# Patient Record
Sex: Male | Born: 1935 | Race: White | Hispanic: No | Marital: Married | Smoking: Former smoker
Health system: Southern US, Community
[De-identification: ages and names within clinical notes are randomized; demographics above are authoritative.]

## PROBLEM LIST (undated history)

## (undated) DIAGNOSIS — I2510 Atherosclerotic heart disease of native coronary artery without angina pectoris: Secondary | ICD-10-CM

## (undated) DIAGNOSIS — I6529 Occlusion and stenosis of unspecified carotid artery: Secondary | ICD-10-CM

## (undated) DIAGNOSIS — R030 Elevated blood-pressure reading, without diagnosis of hypertension: Secondary | ICD-10-CM

## (undated) DIAGNOSIS — K56609 Unspecified intestinal obstruction, unspecified as to partial versus complete obstruction: Secondary | ICD-10-CM

## (undated) DIAGNOSIS — F17201 Nicotine dependence, unspecified, in remission: Secondary | ICD-10-CM

## (undated) DIAGNOSIS — C4490 Unspecified malignant neoplasm of skin, unspecified: Secondary | ICD-10-CM

## (undated) DIAGNOSIS — M1990 Unspecified osteoarthritis, unspecified site: Secondary | ICD-10-CM

## (undated) DIAGNOSIS — I739 Peripheral vascular disease, unspecified: Secondary | ICD-10-CM

## (undated) DIAGNOSIS — I340 Nonrheumatic mitral (valve) insufficiency: Secondary | ICD-10-CM

## (undated) DIAGNOSIS — D649 Anemia, unspecified: Secondary | ICD-10-CM

## (undated) DIAGNOSIS — I441 Atrioventricular block, second degree: Secondary | ICD-10-CM

## (undated) DIAGNOSIS — E278 Other specified disorders of adrenal gland: Secondary | ICD-10-CM

## (undated) DIAGNOSIS — Q791 Other congenital malformations of diaphragm: Secondary | ICD-10-CM

## (undated) DIAGNOSIS — R634 Abnormal weight loss: Secondary | ICD-10-CM

## (undated) DIAGNOSIS — K219 Gastro-esophageal reflux disease without esophagitis: Secondary | ICD-10-CM

## (undated) DIAGNOSIS — E039 Hypothyroidism, unspecified: Secondary | ICD-10-CM

## (undated) DIAGNOSIS — C329 Malignant neoplasm of larynx, unspecified: Secondary | ICD-10-CM

## (undated) DIAGNOSIS — I350 Nonrheumatic aortic (valve) stenosis: Secondary | ICD-10-CM

## (undated) DIAGNOSIS — E785 Hyperlipidemia, unspecified: Secondary | ICD-10-CM

## (undated) HISTORY — DX: Anemia, unspecified: D64.9

## (undated) HISTORY — PX: DECOMPRESSION FACIAL NERVE: SUR400

## (undated) HISTORY — DX: Elevated blood-pressure reading, without diagnosis of hypertension: R03.0

## (undated) HISTORY — DX: Malignant neoplasm of larynx, unspecified: C32.9

## (undated) HISTORY — DX: Atrioventricular block, second degree: I44.1

## (undated) HISTORY — DX: Nicotine dependence, unspecified, in remission: F17.201

## (undated) HISTORY — DX: Nonrheumatic aortic (valve) stenosis: I35.0

## (undated) HISTORY — PX: KNEE ARTHROSCOPY: SUR90

## (undated) HISTORY — DX: Hyperlipidemia, unspecified: E78.5

## (undated) HISTORY — DX: Gastro-esophageal reflux disease without esophagitis: K21.9

## (undated) HISTORY — DX: Hypothyroidism, unspecified: E03.9

## (undated) HISTORY — DX: Peripheral vascular disease, unspecified: I73.9

## (undated) HISTORY — DX: Other specified disorders of adrenal gland: E27.8

## (undated) HISTORY — PX: CATARACT EXTRACTION, BILATERAL: SHX1313

## (undated) HISTORY — DX: Other congenital malformations of diaphragm: Q79.1

## (undated) HISTORY — DX: Atherosclerotic heart disease of native coronary artery without angina pectoris: I25.10

## (undated) HISTORY — DX: Unspecified osteoarthritis, unspecified site: M19.90

## (undated) HISTORY — PX: TOTAL KNEE ARTHROPLASTY: SHX125

## (undated) HISTORY — PX: PACEMAKER INSERTION: SHX728

## (undated) HISTORY — DX: Abnormal weight loss: R63.4

## (undated) HISTORY — PX: INSERT / REPLACE / REMOVE PACEMAKER: SUR710

---

## 1971-09-09 HISTORY — PX: APPENDECTOMY: SHX54

## 1986-09-08 HISTORY — PX: LARYNGECTOMY: SUR815

## 2000-06-10 ENCOUNTER — Inpatient Hospital Stay (HOSPITAL_COMMUNITY): Admission: AD | Admit: 2000-06-10 | Discharge: 2000-06-12 | Payer: Self-pay | Admitting: Cardiology

## 2001-04-18 ENCOUNTER — Emergency Department (HOSPITAL_COMMUNITY): Admission: EM | Admit: 2001-04-18 | Discharge: 2001-04-18 | Payer: Self-pay | Admitting: Emergency Medicine

## 2001-04-18 ENCOUNTER — Encounter: Payer: Self-pay | Admitting: *Deleted

## 2001-06-05 ENCOUNTER — Emergency Department (HOSPITAL_COMMUNITY): Admission: EM | Admit: 2001-06-05 | Discharge: 2001-06-05 | Payer: Self-pay | Admitting: *Deleted

## 2001-06-14 ENCOUNTER — Encounter: Payer: Self-pay | Admitting: Internal Medicine

## 2001-06-14 ENCOUNTER — Ambulatory Visit (HOSPITAL_COMMUNITY): Admission: RE | Admit: 2001-06-14 | Discharge: 2001-06-14 | Payer: Self-pay | Admitting: Internal Medicine

## 2002-02-11 ENCOUNTER — Ambulatory Visit (HOSPITAL_COMMUNITY): Admission: RE | Admit: 2002-02-11 | Discharge: 2002-02-11 | Payer: Self-pay | Admitting: Internal Medicine

## 2002-02-11 ENCOUNTER — Encounter: Payer: Self-pay | Admitting: Internal Medicine

## 2002-03-24 ENCOUNTER — Ambulatory Visit (HOSPITAL_COMMUNITY): Admission: RE | Admit: 2002-03-24 | Discharge: 2002-03-24 | Payer: Self-pay | Admitting: Neurosurgery

## 2002-03-31 ENCOUNTER — Encounter: Payer: Self-pay | Admitting: Internal Medicine

## 2002-03-31 ENCOUNTER — Ambulatory Visit (HOSPITAL_COMMUNITY): Admission: RE | Admit: 2002-03-31 | Discharge: 2002-03-31 | Payer: Self-pay | Admitting: Internal Medicine

## 2002-07-24 ENCOUNTER — Encounter: Payer: Self-pay | Admitting: Emergency Medicine

## 2002-07-24 ENCOUNTER — Inpatient Hospital Stay (HOSPITAL_COMMUNITY): Admission: EM | Admit: 2002-07-24 | Discharge: 2002-07-29 | Payer: Self-pay | Admitting: Emergency Medicine

## 2002-07-28 ENCOUNTER — Encounter: Payer: Self-pay | Admitting: Internal Medicine

## 2002-09-05 ENCOUNTER — Emergency Department (HOSPITAL_COMMUNITY): Admission: EM | Admit: 2002-09-05 | Discharge: 2002-09-06 | Payer: Self-pay | Admitting: Emergency Medicine

## 2002-09-12 ENCOUNTER — Other Ambulatory Visit: Admission: RE | Admit: 2002-09-12 | Discharge: 2002-09-12 | Payer: Self-pay | Admitting: Dermatology

## 2002-11-01 ENCOUNTER — Ambulatory Visit (HOSPITAL_COMMUNITY): Admission: RE | Admit: 2002-11-01 | Discharge: 2002-11-01 | Payer: Self-pay | Admitting: Internal Medicine

## 2002-11-01 ENCOUNTER — Encounter: Payer: Self-pay | Admitting: Internal Medicine

## 2003-04-04 ENCOUNTER — Ambulatory Visit (HOSPITAL_COMMUNITY): Admission: RE | Admit: 2003-04-04 | Discharge: 2003-04-04 | Payer: Self-pay | Admitting: Internal Medicine

## 2003-07-24 ENCOUNTER — Ambulatory Visit (HOSPITAL_COMMUNITY): Admission: RE | Admit: 2003-07-24 | Discharge: 2003-07-24 | Payer: Self-pay | Admitting: Internal Medicine

## 2003-07-26 ENCOUNTER — Other Ambulatory Visit: Admission: RE | Admit: 2003-07-26 | Discharge: 2003-07-26 | Payer: Self-pay | Admitting: Dermatology

## 2003-12-19 ENCOUNTER — Ambulatory Visit (HOSPITAL_COMMUNITY): Admission: RE | Admit: 2003-12-19 | Discharge: 2003-12-19 | Payer: Self-pay | Admitting: Internal Medicine

## 2004-01-17 ENCOUNTER — Ambulatory Visit (HOSPITAL_COMMUNITY): Admission: RE | Admit: 2004-01-17 | Discharge: 2004-01-17 | Payer: Self-pay | Admitting: Internal Medicine

## 2004-03-22 ENCOUNTER — Ambulatory Visit (HOSPITAL_COMMUNITY): Admission: RE | Admit: 2004-03-22 | Discharge: 2004-03-22 | Payer: Self-pay | Admitting: Internal Medicine

## 2005-01-07 ENCOUNTER — Encounter: Admission: RE | Admit: 2005-01-07 | Discharge: 2005-01-07 | Payer: Self-pay | Admitting: Orthopedic Surgery

## 2005-01-08 ENCOUNTER — Ambulatory Visit (HOSPITAL_COMMUNITY): Admission: RE | Admit: 2005-01-08 | Discharge: 2005-01-08 | Payer: Self-pay | Admitting: Orthopedic Surgery

## 2005-01-08 ENCOUNTER — Ambulatory Visit (HOSPITAL_BASED_OUTPATIENT_CLINIC_OR_DEPARTMENT_OTHER): Admission: RE | Admit: 2005-01-08 | Discharge: 2005-01-08 | Payer: Self-pay | Admitting: Orthopedic Surgery

## 2005-01-29 ENCOUNTER — Ambulatory Visit: Payer: Self-pay | Admitting: Internal Medicine

## 2005-01-29 ENCOUNTER — Ambulatory Visit (HOSPITAL_COMMUNITY): Admission: RE | Admit: 2005-01-29 | Discharge: 2005-01-29 | Payer: Self-pay | Admitting: Internal Medicine

## 2005-01-31 ENCOUNTER — Ambulatory Visit (HOSPITAL_COMMUNITY): Admission: RE | Admit: 2005-01-31 | Discharge: 2005-01-31 | Payer: Self-pay | Admitting: Internal Medicine

## 2005-04-02 ENCOUNTER — Ambulatory Visit (HOSPITAL_COMMUNITY): Admission: RE | Admit: 2005-04-02 | Discharge: 2005-04-02 | Payer: Self-pay | Admitting: Internal Medicine

## 2006-02-18 ENCOUNTER — Ambulatory Visit (HOSPITAL_COMMUNITY): Admission: RE | Admit: 2006-02-18 | Discharge: 2006-02-18 | Payer: Self-pay | Admitting: Pulmonary Disease

## 2006-05-29 ENCOUNTER — Ambulatory Visit (HOSPITAL_COMMUNITY): Admission: RE | Admit: 2006-05-29 | Discharge: 2006-05-29 | Payer: Self-pay | Admitting: Internal Medicine

## 2006-06-30 ENCOUNTER — Ambulatory Visit (HOSPITAL_COMMUNITY): Admission: RE | Admit: 2006-06-30 | Discharge: 2006-06-30 | Payer: Self-pay | Admitting: Vascular Surgery

## 2006-08-28 ENCOUNTER — Emergency Department (HOSPITAL_COMMUNITY): Admission: EM | Admit: 2006-08-28 | Discharge: 2006-08-28 | Payer: Self-pay | Admitting: Emergency Medicine

## 2007-11-22 ENCOUNTER — Ambulatory Visit (HOSPITAL_COMMUNITY): Admission: RE | Admit: 2007-11-22 | Discharge: 2007-11-22 | Payer: Self-pay | Admitting: Internal Medicine

## 2007-11-29 ENCOUNTER — Ambulatory Visit (HOSPITAL_COMMUNITY): Admission: RE | Admit: 2007-11-29 | Discharge: 2007-11-29 | Payer: Self-pay | Admitting: Internal Medicine

## 2007-12-29 ENCOUNTER — Ambulatory Visit (HOSPITAL_COMMUNITY): Admission: RE | Admit: 2007-12-29 | Discharge: 2007-12-29 | Payer: Self-pay | Admitting: Neurology

## 2008-01-28 ENCOUNTER — Ambulatory Visit (HOSPITAL_COMMUNITY): Admission: RE | Admit: 2008-01-28 | Discharge: 2008-01-28 | Payer: Self-pay | Admitting: Anesthesiology

## 2008-02-01 ENCOUNTER — Ambulatory Visit (HOSPITAL_BASED_OUTPATIENT_CLINIC_OR_DEPARTMENT_OTHER): Admission: RE | Admit: 2008-02-01 | Discharge: 2008-02-01 | Payer: Self-pay | Admitting: Orthopedic Surgery

## 2008-05-11 ENCOUNTER — Emergency Department (HOSPITAL_COMMUNITY): Admission: EM | Admit: 2008-05-11 | Discharge: 2008-05-12 | Payer: Self-pay | Admitting: Emergency Medicine

## 2008-09-21 ENCOUNTER — Ambulatory Visit: Payer: Self-pay | Admitting: Cardiology

## 2008-09-21 ENCOUNTER — Inpatient Hospital Stay (HOSPITAL_COMMUNITY): Admission: EM | Admit: 2008-09-21 | Discharge: 2008-09-28 | Payer: Self-pay | Admitting: Emergency Medicine

## 2008-09-22 ENCOUNTER — Encounter: Payer: Self-pay | Admitting: Cardiology

## 2008-09-25 ENCOUNTER — Ambulatory Visit: Payer: Self-pay | Admitting: Internal Medicine

## 2008-09-28 ENCOUNTER — Encounter: Payer: Self-pay | Admitting: Internal Medicine

## 2008-10-12 ENCOUNTER — Encounter (INDEPENDENT_AMBULATORY_CARE_PROVIDER_SITE_OTHER): Payer: Self-pay | Admitting: *Deleted

## 2008-10-12 ENCOUNTER — Ambulatory Visit: Payer: Self-pay

## 2008-10-23 ENCOUNTER — Encounter (HOSPITAL_COMMUNITY): Admission: RE | Admit: 2008-10-23 | Discharge: 2008-11-13 | Payer: Self-pay | Admitting: Internal Medicine

## 2008-12-27 DIAGNOSIS — E039 Hypothyroidism, unspecified: Secondary | ICD-10-CM | POA: Insufficient documentation

## 2008-12-27 DIAGNOSIS — Z8669 Personal history of other diseases of the nervous system and sense organs: Secondary | ICD-10-CM

## 2008-12-27 DIAGNOSIS — M1990 Unspecified osteoarthritis, unspecified site: Secondary | ICD-10-CM | POA: Insufficient documentation

## 2008-12-29 ENCOUNTER — Encounter: Payer: Self-pay | Admitting: Internal Medicine

## 2008-12-29 ENCOUNTER — Ambulatory Visit: Payer: Self-pay | Admitting: Internal Medicine

## 2009-01-22 ENCOUNTER — Ambulatory Visit (HOSPITAL_COMMUNITY): Admission: RE | Admit: 2009-01-22 | Discharge: 2009-01-22 | Payer: Self-pay | Admitting: Internal Medicine

## 2009-03-07 ENCOUNTER — Ambulatory Visit: Payer: Self-pay | Admitting: Cardiology

## 2009-03-07 ENCOUNTER — Encounter: Payer: Self-pay | Admitting: Cardiology

## 2009-03-07 DIAGNOSIS — F17201 Nicotine dependence, unspecified, in remission: Secondary | ICD-10-CM

## 2009-03-07 DIAGNOSIS — I739 Peripheral vascular disease, unspecified: Secondary | ICD-10-CM

## 2009-03-07 DIAGNOSIS — I679 Cerebrovascular disease, unspecified: Secondary | ICD-10-CM

## 2009-03-07 DIAGNOSIS — C329 Malignant neoplasm of larynx, unspecified: Secondary | ICD-10-CM

## 2009-03-07 DIAGNOSIS — I359 Nonrheumatic aortic valve disorder, unspecified: Secondary | ICD-10-CM

## 2009-03-13 ENCOUNTER — Encounter: Payer: Self-pay | Admitting: Cardiology

## 2009-03-13 LAB — CONVERTED CEMR LAB
Basophils Relative: 0 % (ref 0–1)
Eosinophils Absolute: 0.6 10*3/uL (ref 0.0–0.7)
Eosinophils Relative: 9 % — ABNORMAL HIGH (ref 0–5)
HCT: 36.2 % — ABNORMAL LOW (ref 39.0–52.0)
HDL: 50 mg/dL (ref >39)
Hemoglobin: 11.9 g/dL — ABNORMAL LOW (ref 13.0–17.0)
Lymphs Abs: 1.8 10*3/uL (ref 0.7–4.0)
MCHC: 32.9 g/dL (ref 30.0–36.0)
MCV: 96 fL (ref 78.0–100.0)
Monocytes Absolute: 0.7 10*3/uL (ref 0.1–1.0)
Monocytes Relative: 10 % (ref 3–12)
Neutrophils Relative %: 56 % (ref 43–77)
RBC: 3.77 M/uL — ABNORMAL LOW (ref 4.22–5.81)
Total CHOL/HDL Ratio: 2.7
VLDL: 13 mg/dL (ref 0–40)
WBC: 7.2 10*3/uL (ref 4.0–10.5)

## 2009-03-20 ENCOUNTER — Telehealth (INDEPENDENT_AMBULATORY_CARE_PROVIDER_SITE_OTHER): Payer: Self-pay | Admitting: *Deleted

## 2009-04-06 ENCOUNTER — Ambulatory Visit: Payer: Self-pay | Admitting: Cardiology

## 2009-04-20 ENCOUNTER — Ambulatory Visit: Payer: Self-pay

## 2009-04-20 ENCOUNTER — Encounter: Payer: Self-pay | Admitting: Internal Medicine

## 2009-04-30 ENCOUNTER — Encounter (INDEPENDENT_AMBULATORY_CARE_PROVIDER_SITE_OTHER): Payer: Self-pay | Admitting: *Deleted

## 2009-05-02 LAB — CONVERTED CEMR LAB
OCCULT 1: NEGATIVE
OCCULT 2: POSITIVE

## 2009-07-11 ENCOUNTER — Encounter (INDEPENDENT_AMBULATORY_CARE_PROVIDER_SITE_OTHER): Payer: Self-pay | Admitting: *Deleted

## 2009-07-11 LAB — CONVERTED CEMR LAB
Albumin: 4.1 g/dL
Alkaline Phosphatase: 69 units/L
BUN: 15 mg/dL
CO2: 24 meq/L
Calcium: 9 mg/dL
Glucose, Bld: 90 mg/dL
Sodium: 133 meq/L

## 2009-09-19 ENCOUNTER — Encounter (INDEPENDENT_AMBULATORY_CARE_PROVIDER_SITE_OTHER): Payer: Self-pay | Admitting: *Deleted

## 2009-09-20 ENCOUNTER — Ambulatory Visit: Payer: Self-pay | Admitting: Cardiovascular Disease

## 2009-09-20 ENCOUNTER — Encounter: Payer: Self-pay | Admitting: Adult Health

## 2009-09-20 DIAGNOSIS — Z950 Presence of cardiac pacemaker: Secondary | ICD-10-CM

## 2009-09-21 ENCOUNTER — Ambulatory Visit: Payer: Self-pay | Admitting: Cardiology

## 2009-09-21 ENCOUNTER — Ambulatory Visit (HOSPITAL_COMMUNITY): Admission: RE | Admit: 2009-09-21 | Discharge: 2009-09-21 | Payer: Self-pay | Admitting: Cardiovascular Disease

## 2009-09-21 ENCOUNTER — Encounter: Payer: Self-pay | Admitting: Cardiovascular Disease

## 2009-09-25 ENCOUNTER — Encounter: Payer: Self-pay | Admitting: Adult Health

## 2009-10-24 ENCOUNTER — Ambulatory Visit: Payer: Self-pay | Admitting: Internal Medicine

## 2009-10-24 ENCOUNTER — Encounter: Payer: Self-pay | Admitting: Internal Medicine

## 2009-10-24 DIAGNOSIS — E785 Hyperlipidemia, unspecified: Secondary | ICD-10-CM

## 2009-10-31 ENCOUNTER — Encounter (INDEPENDENT_AMBULATORY_CARE_PROVIDER_SITE_OTHER): Payer: Self-pay | Admitting: *Deleted

## 2010-02-08 ENCOUNTER — Emergency Department (HOSPITAL_COMMUNITY): Admission: EM | Admit: 2010-02-08 | Discharge: 2010-02-08 | Payer: Self-pay | Admitting: Emergency Medicine

## 2010-02-08 LAB — CONVERTED CEMR LAB
CO2: 27 meq/L
Creatinine, Ser: 0.78 mg/dL
GFR calc non Af Amer: >60 mL/min
Glomerular Filtration Rate, Af Am: >60 mL/min/{1.73_m2}
Glucose, Bld: 93 mg/dL
HCT: 31.9 %
Hemoglobin: 10.8 g/dL
Platelets: 253 10*3/uL
WBC: 6.5 10*3/uL

## 2010-02-14 ENCOUNTER — Ambulatory Visit (HOSPITAL_COMMUNITY): Admission: RE | Admit: 2010-02-14 | Discharge: 2010-02-14 | Payer: Self-pay | Admitting: Internal Medicine

## 2010-04-03 ENCOUNTER — Encounter (INDEPENDENT_AMBULATORY_CARE_PROVIDER_SITE_OTHER): Payer: Self-pay | Admitting: *Deleted

## 2010-04-08 ENCOUNTER — Ambulatory Visit: Payer: Self-pay | Admitting: Cardiology

## 2010-04-08 ENCOUNTER — Encounter (INDEPENDENT_AMBULATORY_CARE_PROVIDER_SITE_OTHER): Payer: Self-pay | Admitting: *Deleted

## 2010-04-08 DIAGNOSIS — I441 Atrioventricular block, second degree: Secondary | ICD-10-CM

## 2010-04-08 DIAGNOSIS — D649 Anemia, unspecified: Secondary | ICD-10-CM

## 2010-04-09 ENCOUNTER — Encounter: Payer: Self-pay | Admitting: Cardiology

## 2010-04-09 LAB — CONVERTED CEMR LAB
ALT: 16 units/L (ref 0–53)
AST: 18 units/L (ref 0–37)
Alkaline Phosphatase: 78 units/L (ref 39–117)
Basophils Absolute: 0.1 10*3/uL (ref 0.0–0.1)
Basophils Relative: 1 % (ref 0–1)
CO2: 24 meq/L (ref 19–32)
Cholesterol: 115 mg/dL (ref 0–200)
Creatinine, Ser: 0.85 mg/dL (ref 0.40–1.50)
Eosinophils Absolute: 0.7 10*3/uL (ref 0.0–0.7)
Eosinophils Relative: 9 % — ABNORMAL HIGH (ref 0–5)
HCT: 38.1 % — ABNORMAL LOW (ref 39.0–52.0)
Hemoglobin: 12.2 g/dL — ABNORMAL LOW (ref 13.0–17.0)
MCHC: 32 g/dL (ref 30.0–36.0)
Monocytes Absolute: 0.8 10*3/uL (ref 0.1–1.0)
RDW: 13.2 % (ref 11.5–15.5)
Total Bilirubin: 0.6 mg/dL (ref 0.3–1.2)
Total CHOL/HDL Ratio: 2.7
VLDL: 20 mg/dL (ref 0–40)

## 2010-04-10 ENCOUNTER — Ambulatory Visit: Payer: Self-pay | Admitting: Cardiology

## 2010-05-10 ENCOUNTER — Ambulatory Visit: Payer: Self-pay | Admitting: Cardiology

## 2010-05-21 ENCOUNTER — Ambulatory Visit (HOSPITAL_COMMUNITY): Admission: RE | Admit: 2010-05-21 | Discharge: 2010-05-21 | Payer: Self-pay | Admitting: Internal Medicine

## 2010-06-06 ENCOUNTER — Emergency Department (HOSPITAL_COMMUNITY)
Admission: EM | Admit: 2010-06-06 | Discharge: 2010-06-06 | Payer: Self-pay | Source: Home / Self Care | Admitting: Emergency Medicine

## 2010-06-12 ENCOUNTER — Ambulatory Visit: Payer: Self-pay | Admitting: Internal Medicine

## 2010-06-13 ENCOUNTER — Telehealth: Payer: Self-pay | Admitting: Gastroenterology

## 2010-06-13 ENCOUNTER — Encounter: Payer: Self-pay | Admitting: Internal Medicine

## 2010-06-17 ENCOUNTER — Ambulatory Visit: Payer: Self-pay | Admitting: Gastroenterology

## 2010-06-17 DIAGNOSIS — J189 Pneumonia, unspecified organism: Secondary | ICD-10-CM

## 2010-06-17 DIAGNOSIS — K219 Gastro-esophageal reflux disease without esophagitis: Secondary | ICD-10-CM

## 2010-06-17 DIAGNOSIS — R634 Abnormal weight loss: Secondary | ICD-10-CM

## 2010-06-20 ENCOUNTER — Ambulatory Visit: Payer: Self-pay | Admitting: Internal Medicine

## 2010-06-20 ENCOUNTER — Ambulatory Visit (HOSPITAL_COMMUNITY): Admission: RE | Admit: 2010-06-20 | Discharge: 2010-06-20 | Payer: Self-pay | Admitting: Internal Medicine

## 2010-06-21 ENCOUNTER — Telehealth: Payer: Self-pay | Admitting: Internal Medicine

## 2010-06-28 ENCOUNTER — Telehealth (INDEPENDENT_AMBULATORY_CARE_PROVIDER_SITE_OTHER): Payer: Self-pay

## 2010-07-05 ENCOUNTER — Encounter (INDEPENDENT_AMBULATORY_CARE_PROVIDER_SITE_OTHER): Payer: Self-pay | Admitting: *Deleted

## 2010-07-08 ENCOUNTER — Telehealth (INDEPENDENT_AMBULATORY_CARE_PROVIDER_SITE_OTHER): Payer: Self-pay

## 2010-07-15 ENCOUNTER — Telehealth (INDEPENDENT_AMBULATORY_CARE_PROVIDER_SITE_OTHER): Payer: Self-pay

## 2010-07-17 ENCOUNTER — Telehealth: Payer: Self-pay | Admitting: Gastroenterology

## 2010-07-18 ENCOUNTER — Ambulatory Visit (HOSPITAL_COMMUNITY): Admission: RE | Admit: 2010-07-18 | Discharge: 2010-07-18 | Payer: Self-pay | Admitting: Internal Medicine

## 2010-07-19 ENCOUNTER — Telehealth (INDEPENDENT_AMBULATORY_CARE_PROVIDER_SITE_OTHER): Payer: Self-pay

## 2010-08-07 ENCOUNTER — Ambulatory Visit: Payer: Self-pay | Admitting: Internal Medicine

## 2010-08-09 DIAGNOSIS — A048 Other specified bacterial intestinal infections: Secondary | ICD-10-CM | POA: Insufficient documentation

## 2010-08-09 DIAGNOSIS — B3781 Candidal esophagitis: Secondary | ICD-10-CM | POA: Insufficient documentation

## 2010-09-09 ENCOUNTER — Emergency Department (HOSPITAL_COMMUNITY)
Admission: EM | Admit: 2010-09-09 | Discharge: 2010-09-10 | Payer: Self-pay | Source: Home / Self Care | Admitting: Emergency Medicine

## 2010-09-12 ENCOUNTER — Ambulatory Visit: Admit: 2010-09-12 | Payer: Self-pay | Admitting: Internal Medicine

## 2010-09-17 ENCOUNTER — Encounter (INDEPENDENT_AMBULATORY_CARE_PROVIDER_SITE_OTHER): Payer: Self-pay | Admitting: *Deleted

## 2010-09-20 ENCOUNTER — Encounter: Payer: Self-pay | Admitting: Internal Medicine

## 2010-09-20 ENCOUNTER — Ambulatory Visit: Admission: RE | Admit: 2010-09-20 | Discharge: 2010-09-20 | Payer: Self-pay | Source: Home / Self Care

## 2010-09-28 ENCOUNTER — Encounter: Payer: Self-pay | Admitting: Internal Medicine

## 2010-09-29 ENCOUNTER — Encounter: Payer: Self-pay | Admitting: Internal Medicine

## 2010-10-07 ENCOUNTER — Emergency Department (HOSPITAL_COMMUNITY)
Admission: EM | Admit: 2010-10-07 | Discharge: 2010-10-08 | Payer: Self-pay | Source: Home / Self Care | Admitting: Emergency Medicine

## 2010-10-08 NOTE — Progress Notes (Signed)
Summary: phone note/ rash  Phone Note Call from Patient   Caller: Spouse Summary of Call: Pt's wife called and said he broke out with a rash on Tues after starting Diflucan on Mon. The rash was under his arms, on his abdomen and also on his face. She reports it is much better today, and she wants to know if it could have come from the Diflucan. She is aware Dr. Jena Gauss on vacation and [place], [place] will address later today when she is here. Initial call taken by: Cloria Spring LPN,  June 28, 2010 9:36 AM     Appended Document: phone note/ rash Please address with Dr. Darrick Penna TODAY. I'm not sure what to do in this case. Thanks.  Appended Document: phone note/ rash Please call pt. He should hold the DIF. Call Dr. Jena Gauss on [facility] for an alternative med.  Appended Document: phone note/ rash See SLF recommendations above.  Appended Document: phone note/ rash Pt was informed to stop Diflucan and wait til we contact Dr. Jena Gauss on Mon.  Appended Document: phone note/ rash its reportd rash got better while on diflucan; if this is the case, may be a coincidence; did better over a 4 day period while on med/please confirm  Appended Document: phone note/ rash tried to call pt- he was not at home. wife stated she has not seen him since early this am and wasnt sure how his rash was doing but she would ask him tonight and is requesting we call back tomorrow.   Appended Document: phone note/ rash Pt says his rash is better, still a little on face, around ears and on stomach. Do you want him to  start Diflucan again? Please advise!  Appended Document: phone note/ rash no more diflucan;will rx itraconazole 200mg  orally daily x 14 days - no refills -  then plan for antiHP treattment   Appended Document: phone note/ rash Called to inform pt's wife. She said he had already started on the Prevpak x 2 days ago. (Pharmacist and wife was told that he was not supposed to start the prevpak until  finished Diflucan...but after pt was taken off of the Diflucan for a few days, wife thought he was supposed to start the Prevpak.) Please advise if the above Rx needs to be called in now, and how to do the meds.  Appended Document: phone note/ rash Pt came by the office first thing this morning. York Spaniel he is concerned because he has lost 5 more pounds. Said he feels like he has to have a BM alot, but goes to the bathroom and only has gas.  He had a good BM yesterday. ( He also asked about the above meds, and what he needs to do.  I told him that would be addressed with Dr. Jena Gauss first thing this AM and give him a call.  Appended Document: phone note/ rash Per Dr. Jena Gauss, informed pt's wife that he should stop taking the PrevPak now. (Will call in 2 days worth later to complete regimen for the H.Pylori treatment.) Called in the itraconmazole to [facility]  @ [employer]. Pt also called back and i explained it all to him. I told him to call when he  completes the itraconazole and we will instruct on the other antibiotics and call the remainder in.   Appended Document: phone note/ rash as ordered yesterday Tyler Aas); stop prevpak; take entire course new antifungal regimen prescribed; then go with entire 14 day course of prevpak

## 2010-10-08 NOTE — Miscellaneous (Signed)
Summary: dx correction  Clinical Lists Changes  Problems: Changed problem from PACEMAKER (ICD-V45..01) to PACEMAKER, PERMANENT (ICD-V45.01)  changed the incorrect dx code to correct dx code 

## 2010-10-08 NOTE — Miscellaneous (Signed)
Summary: LABS TSH 10/31/2009  Clinical Lists Changes  Observations: Added new observation of TSH: 0.748 microintl units/mL (10/31/2009 17:05)

## 2010-10-08 NOTE — Assessment & Plan Note (Signed)
Summary: pt went to ED for chest pain/ told him his pacer was running ...   Visit Type:  Follow-up Primary Provider:  Carylon Perches  CC:  pt having issues with indigestion.  History of Present Illness: Mr. Tortorella returns today for followup of his PPM.  He notes that he had been in the hospital with c/p and this was thought most likely due to acid reflux.  He is now on as needed zantac.  He denies syncope or sob.  He notes that his appetite has been reduced. No syncope.  Current Medications (verified): 1)  Levothroid 150 Mcg Tabs (Levothyroxine Sodium) .... Take 1 Tab Daily 2)  Multivitamins   Tabs (Multiple Vitamin) .Marland Kitchen.. 1 By Mouth Once Daily 3)  Simvastatin 40 Mg Tabs (Simvastatin) .... Take One Tablet By Mouth Daily At Bedtime 4)  Aspirin 81 Mg Tbec (Aspirin) .... Take Occasionally 5)  Vitamin D3 .Marland Kitchen.. 1 By Mouth Once Daily 6)  Bone  Density .Marland Kitchen.. 1 By Mouth Once Daily 7)  Hydrocodone-Acetaminophen 5-500 Mg Tabs (Hydrocodone-Acetaminophen) .... Take As Needed 8)  Era Bumpers .Marland Kitchen.. 3 Tablets By Mouth Two Times A Day 9)  True Aloe .Marland Kitchen.. 2 Tablets By Mouth Two Times A Day  Allergies (verified): No Known Drug Allergies  Comments:  Nurse/Medical Assistant: patient was given med list to review stated meds were correct also went over previous med list and ov with patient  Past History:  Past Medical History: Last updated: 04/08/2010  1. Nonobstructive coronary artery disease: 09/2008-50% proximal and 40% mid LAD; 25% circumflex; 30% RCA; mild global LV dysfunction with EF of 45%.  No aortic stenosis.  2. Mild aortic stenosis   3. Peripheral vascular disease with a 70% innominate artery stenosis and nonobstructive carotid stenosis.   4. Hypothyroidism.   5. Degenerative joint disease status post bilateral total knee replacements.   6. 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 Tobacco abuse-remote  Past Surgical History: Last updated: 12/27/2008  status post laryngectomy and  radiation therapy in       1988.  Appendectomy in 1973.   Arthroscopic left knee surgery.  Bilateral cataract surgery.   Decompression right median nerve.01/08/2005  Review of Systems  The patient denies syncope, dyspnea on exertion, and peripheral edema.    Vital Signs:  Patient profile:   77 year old male Weight:      169 pounds BMI:     25.05 Pulse rate:   78 / minute BP sitting:   121 / 67  (right arm)  Vitals Entered By: Dreama Saa, CNA (June 12, 2010 1:53 PM)  Physical Exam  General:  Proportionate height and weight and; well developed; no acute distress:   Neck-No JVD; status post laryngectomy with tracheostomy stoma Lungs-No tachypnea, no rales; no rhonchi; no wheezes: Cardiovascular-normal PMI; normal S1; decreased A2; grade 3/6 nearly holosystolic murmur at the cardiac base heard widely across the precordium and radiating to the carotids Abdomen-BS normal; soft and non-tender without masses or organomegaly:  Musculoskeletal-No deformities, no cyanosis or clubbing; right lower quadrant pain reproduced with abduction and flexion of hip Neurologic-Normal cranial nerves; symmetric strength and tone:  Skin-Warm, no significant lesions: Extremities-Nl distal pulses; no edema:     PPM Specifications Following MD:  Lewayne Bunting, MD     PPM Vendor:  Medtronic     PPM Model Number:  ADDRL1     PPM Serial Number:  [id number] PPM DOI:  09/27/2008     PPM  Implanting MD:  Hillis Range, MD  Lead 1    Location: RA     DOI: 09/27/2008     Model #: 1610     Serial #: [id number]     Status: active Lead 2    Location: RV     DOI: 09/27/2008     Model #: 1914     Serial #: [id number]     Status: active  Magnet Response Rate:  BOL 85 ERI  65  Indications:  Sinus node dysfunction   PPM Follow Up Remote Check?  No Battery Voltage:  2.8 V     Battery Est. Longevity:  13.5 years     Pacer Dependent:  No       PPM Device Measurements Atrium  Amplitude: 2.0 mV, Impedance:  493 ohms, Threshold: 0.375 V at 0.4 msec Right Ventricle  Amplitude: 11.2 mV, Impedance: 558 ohms, Threshold: 0.5 V at 0.4 msec  Episodes MS Episodes:  3     Percent Mode Switch:  <0.1%     Coumadin:  No Ventricular High Rate:  1     Atrial Pacing:  27.9%     Ventricular Pacing:  0.3%  Parameters Mode:  DDD     Lower Rate Limit:  60     Upper Rate Limit:  130 Paced AV Delay:  320     Sensed AV Delay:  300 Next Remote Date:  09/12/2010     Next Cardiology Appt Due:  06/09/2011 Tech Comments:  No parameter changes.  Device function normal.  1 VHR 16 seconds.  Carelink transmissions every 3 months.  ROV 1 year with Dr. Ladona Ridgel in [place]. Altha Harm, LPN  June 12, 2010 2:08 PM  MD Comments:  Agree with above.  Impression & Recommendations:  Problem # 1:  PACEMAKER (ICD-V45.Marland Kitchen01) His device is working normally.  Will recheck in several months.  Problem # 2:  HYPERTENSION (ICD-401.1) His blood pressure is well controlled on no medical therapy.  He will continue a low sodium diet. His updated medication list for this problem includes:    Aspirin 81 Mg Tbec (Aspirin) .Marland Kitchen... Take occasionally  Problem # 3:  AORTIC STENOSIS, MILD (ICD-424.1) He appears to be asymptomatic with this.  At his advanced age, I no not think it will likely by an issue.  Patient Instructions: 1)  Your physician recommends that you schedule a follow-up appointment in: 1 year 2)  Your physician recommends that you continue on your current medications as directed. Please refer to the Current Medication list given to you today.

## 2010-10-08 NOTE — Assessment & Plan Note (Signed)
Summary: 1/11 f/u per checkout on 04/20/09/tg   Visit Type:  Follow-up Primary Provider:  Carylon Perches   History of Present Illness: Mr Hartlage is a 77 y/o CM with known history of throat cancer, for which his larynx was removed.  He uses an amplifier to speak.  He also has a history of mild CAD with a 40% lesion in the proximal LAD per cath in 2001, AoV disease, conduction system disease (Mobitz  II AV Block) for which he has had a Medtronic Dual Chamber PMK placed 09/2008. He continues to have the vibrating feeling.   He does not have associated CP, dizziness, NV, of SOB associated with this. He admits to lack of energy over the last month.  Otherwise he is asymptomatic.    Current Medications (verified): 1)  Levothroid 200 Mcg Tabs (Levothyroxine Sodium) .Marland Kitchen.. 1 By Mouth Once Daily 2)  Multivitamins   Tabs (Multiple Vitamin) .Marland Kitchen.. 1 By Mouth Once Daily 3)  Diazepam 2 Mg Tabs (Diazepam) .Marland Kitchen.. 1 By Mouth Two Times A Day As Needed 4)  Simvastatin 40 Mg Tabs (Simvastatin) .... Take One Tablet By Mouth Daily At Bedtime 5)  Alpha-Lipoic Acid 200 Mg Caps (Alpha-Lipoic Acid) .Marland Kitchen.. 1 By Mouth Once Daily 6)  Aspirin 81 Mg Tbec (Aspirin) .... Take One Tablet By Mouth Daily 7)  Vitamin D3 .Marland Kitchen.. 1 By Mouth Once Daily 8)  Bone  Density .Marland Kitchen.. 1 By Mouth Once Daily 9)  Allopurinol 300 Mg Tabs (Allopurinol) .... Take 1 Tab Daily  Allergies (verified): No Known Drug Allergies  Past History:  Past Medical History: Last updated: 12/29/2008  1. Nonobstructive coronary artery disease.   2. Mild aortic stenosis.   3. Peripheral vascular disease with a 70% innominate artery stenosis and nonobstructive carotid stenosis.   4. Hypothyroidism.   5. Degenerative joint disease status post bilateral total knee  replacements.   6. 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  Appendectomy in 1973.   Arthroscopic left knee surgery.  Bilateral cataract surgery.   Decompression right median nerve.01/08/2005  Review of Systems  The patient denies chest pain, syncope, dyspnea on exertion, and peripheral edema.    Vital Signs:  Patient profile:   77 year old male Weight:      199 pounds Pulse rate:   67 / minute BP sitting:   135 / 57  (right arm)  Vitals Entered By: Dreama Saa, CNA (October 24, 2009 2:02 PM)  Physical Exam  General:  Well developed, well nourished, in no acute distress. Head:  normocephalic and atraumatic Eyes:  PERRLA/EOM intact; conjunctiva and lids normal. Ears:  TM's intact and clear with normal canals and hearing Neck:  Cover over tracheal area. Chest Wall:  Pacemaker generator present in the left infraclavicular region; benign insertion site. Lungs:  Clear bilaterally to auscultation. No wheezes, rales, or rhonchi. Heart:  Grade  2/6 DM loudest at primary aortic area.   Abdomen:  Bowel sounds positive; abdomen soft and non-tender without masses, organomegaly, or hernias noted. No hepatosplenomegaly. Msk:  Back normal, normal gait. Muscle strength and tone normal. Pulses:  pulses normal in all 4 extremities Extremities:  No clubbing or cyanosis. Neurologic:  Alert and oriented x 3.   PPM Specifications Following MD:  Hillis Range, MD     PPM Vendor:  Medtronic     PPM Model Number:  ADDRL1     PPM Serial Number:  [id number] PPM DOI:  09/27/2008     PPM Implanting MD:  Hillis Range, MD  Lead 1    Location: RA     DOI: 09/27/2008     Model #: 4098     Serial #: [id number]     Status: active Lead 2    Location: RV     DOI: 09/27/2008     Model #: 6213     Serial #: [id number]     Status: active  Magnet Response Rate:  BOL 85 ERI  65  Indications:  Sinus node dysfunction   PPM Follow Up Remote Check?  No Battery Voltage:  2.80 V     Battery Est. Longevity:  10 years     Pacer Dependent:  No       PPM Device Measurements Atrium  Amplitude: 2.8  mV, Impedance: 530 ohms, Threshold: 0.5 V at 0.4 msec Right Ventricle  Amplitude: 15.68 mV, Impedance: 558 ohms, Threshold: 0.75 V at 0.4 msec  Episodes MS Episodes:  0     Percent Mode Switch:  0     Coumadin:  No Ventricular High Rate:  0     Atrial Pacing:  27.8%     Ventricular Pacing:  3.0%  Parameters Mode:  DDD     Lower Rate Limit:  60     Upper Rate Limit:  130 Paced AV Delay:  320     Sensed AV Delay:  300 Next Cardiology Appt Due:  04/08/2010 Tech Comments:  No parameter changes.  Device function normal.  No Carelink @ this time. ROV 6 months [facility] clinic. Altha Harm, LPN  October 24, 2009 2:14 PM  MD Comments:  Agree with above.  Impression & Recommendations:  Problem # 1:  PACEMAKER (ICD-V45.Marland Kitchen01) His device is working normally today.  Will recheck in several months.  Problem # 2:  CAD (ICD-414.00) he denies anginal symptoms.  Continue current meds. His updated medication list for this problem includes:    Aspirin 81 Mg Tbec (Aspirin) .Marland Kitchen... Take one tablet by mouth daily  Problem # 3:  DYSLIPIDEMIA (ICD-272.4) Continue current meds.  A low fat diet is recommended. His updated medication list for this problem includes:    Simvastatin 40 Mg Tabs (Simvastatin) .Marland Kitchen... Take one tablet by mouth daily at bedtime  Patient Instructions: 1)  Your physician recommends that you schedule a follow-up appointment in: 1 year

## 2010-10-08 NOTE — Progress Notes (Signed)
Summary: PT NEEDS HELP WITH HOME DEVICE CHECK  Phone Note Call from Patient Call back at Home Phone [phone]   Caller: PT Reason for Call: Talk to Nurse Summary of Call: PT HAS GOT THE MACHINE IN THE MAIL TO BE ABLE TO DO PHONE CHECKS FOR HIS DEVICE, BUT HE IS UNABLE TO FIGURE IT OUT EVEN AFTER READING INSTRUCTIONS. PT WOULD LIKE SOMEONE TO COME TO HIS HOUSE AND HOOK IT UP THE FIRST TIME SO HE DOESNT MESS IT UP.   PLUS HE STATES THAT THE DOCTORS HERE HAVE IT IN HIS RECORDS THAT HE HAS BEEN TREATED FOR HIGH BLOOD PRESSURE AND HE NEVER HAS AND WANTS THAT TAKEN OUT OF HIS FILE. Initial call taken by: Faythe Ghee,  June 21, 2010 9:28 AM  Follow-up for Phone Call        Pt. will come by the office so we can demonstrate how to use the device. Whitney Maeola Sarah RN  June 21, 2010 11:16 AM  Follow-up by: Whitney Maeola Sarah RN,  June 21, 2010 11:16 AM

## 2010-10-08 NOTE — Progress Notes (Signed)
Summary: WEIGHT LOSS  ---- Converted from flag ---- Spoke with Dr. Ouida Sills- pt weighted 171 lbs 91211, 186 7/26/1, 195 lbs MAR 2011  ---- 06/13/2010 12:07 PM, Ave Filter wrote: Pt to be seen 06/17/10 with KJ or Tobi Bastos.  ---- 06/13/2010 12:01 PM, [place] MD wrote: schedule pt with Tobi Bastos ASAP.  ---- 06/12/2010 7:32 AM, Ave Filter wrote: This pt was rescheduled from 06/12/10.Patient's wife stated he reall needed to be seen sooner than 06/20/10.He has lost 40 lbs.Marland KitchenMarland KitchenPlease advise? ------------------------------

## 2010-10-08 NOTE — Assessment & Plan Note (Signed)
Summary: nurse visit per checkout on 04/08/10/tg  Nurse Visit   Vital Signs:  Patient profile:   77 year old male Weight:      173 pounds Pulse rate:   60 / minute BP sitting:   144 / 59  (left arm)  Vitals Entered By: Larita Fife Via LPN (May 10, 2010 8:39 AM)  Visit Type:  BP check/nurse visit Primary Provider:  Carylon Perches   History of Present Illness: S: Pt. arrives in office for BP check. B: On last OV of 04-08-10 with Dr. Dietrich Pates pt. was asked to monitor BP, have labs drawn and return today for BP check.  A: H/H= 12.2 and 38.1, Glucose= 101, CHOL= 115, TRI= 98, HDL= 43 and LDL= 52.  Pt. wants to know if he can stop Simvastatin due to lab results.  He states he is taking Era Bumpers and True Aloe (?) vitamins daily and feels that is the reason his lab results have improved.  Pt's wife states he receives these vitamins through the mail from [place]. No SBP is > 149 or < 99 and no DBP is > 72 or < 52 ( BP diary scanned into chart).   Pt's weight on 04-08-10 was 182, today it is 173. Pt's wife states "he runs from sun up til sundown and does not have much of an appetite".  Pt. has no complaints at this time.  R: Pt. encouraged to eat more and we will call him with Dr. Marvel Plan recommendations on stopping Simvastatin and any other changes.  05/14/2010  Patient's cholesterol is controlled because he takes simvastatin; drug only works while one is taking it; he definitely should not stop simvastatin.  Blood pressure control is good.  Followup as planned.  McCloud Bing, M.D.   Pt. advised, he expressed verbal understanding.     Larita Fife Via LPN  May 14, 2010 9:04 AM    Current Medications (verified): 1)  Levothroid 150 Mcg Tabs (Levothyroxine Sodium) .... Take 1 Tab Daily 2)  Multivitamins   Tabs (Multiple Vitamin) .Marland Kitchen.. 1 By Mouth Once Daily 3)  Simvastatin 40 Mg Tabs (Simvastatin) .... Take One Tablet By Mouth Daily At Bedtime 4)  Aspirin 81 Mg Tbec (Aspirin) .... Take  Occasionally 5)  Vitamin D3 .Marland Kitchen.. 1 By Mouth Once Daily 6)  Bone  Density .Marland Kitchen.. 1 By Mouth Once Daily 7)  Hydrocodone-Acetaminophen 5-500 Mg Tabs (Hydrocodone-Acetaminophen) .... Take As Needed 8)  Era Bumpers .Marland Kitchen.. 3 Tablets By Mouth Two Times A Day 9)  True Aloe .Marland Kitchen.. 2 Tablets By Mouth Two Times A Day  Allergies (verified): No Known Drug Allergies

## 2010-10-08 NOTE — Procedures (Signed)
Summary: 6 mth f/u per checkout on 10/24/09/tg   Current Medications (verified): 1)  Levothroid 200 Mcg Tabs (Levothyroxine Sodium) .Marland Kitchen.. 1 By Mouth Once Daily 2)  Multivitamins   Tabs (Multiple Vitamin) .Marland Kitchen.. 1 By Mouth Once Daily 3)  Diazepam 2 Mg Tabs (Diazepam) .Marland Kitchen.. 1 By Mouth Two Times A Day As Needed 4)  Simvastatin 40 Mg Tabs (Simvastatin) .... Take One Tablet By Mouth Daily At Bedtime 5)  Alpha-Lipoic Acid 200 Mg Caps (Alpha-Lipoic Acid) .Marland Kitchen.. 1 By Mouth Once Daily 6)  Aspirin 81 Mg Tbec (Aspirin) .... Take One Tablet By Mouth Daily 7)  Vitamin D3 .Marland Kitchen.. 1 By Mouth Once Daily 8)  Bone  Density .Marland Kitchen.. 1 By Mouth Once Daily 9)  Allopurinol 300 Mg Tabs (Allopurinol) .... Take 1 Tab Daily  Allergies (verified): No Known Drug Allergies   PPM Specifications Following MD:  Lewayne Bunting, MD     PPM Vendor:  Medtronic     PPM Model Number:  ADDRL1     PPM Serial Number:  [id number] PPM DOI:  09/27/2008     PPM Implanting MD:  Hillis Range, MD  Lead 1    Location: RA     DOI: 09/27/2008     Model #: 4098     Serial #: [id number]     Status: active Lead 2    Location: RV     DOI: 09/27/2008     Model #: 6213     Serial #: [id number]     Status: active  Magnet Response Rate:  BOL 85 ERI  65  Indications:  Sinus node dysfunction   PPM Follow Up Remote Check?  No Battery Voltage:  2.80 V     Battery Est. Longevity:  13.5 years     Pacer Dependent:  No       PPM Device Measurements Atrium  Amplitude: 2.8 mV, Impedance: 507 ohms, Threshold: 0.5 V at 0.4 msec Right Ventricle  Amplitude: 15.68 mV, Impedance: 571 ohms, Threshold: 0.625 V at 0.4 msec  Episodes MS Episodes:  0     Percent Mode Switch:  0     Coumadin:  No Ventricular High Rate:  2     Atrial Pacing:  29.6%     Ventricular Pacing:  2.7%  Parameters Mode:  DDD     Lower Rate Limit:  60     Upper Rate Limit:  130 Paced AV Delay:  320     Sensed AV Delay:  300 Next Remote Date:  07/11/2010     Next Cardiology Appt Due:   10/09/2010 Tech Comments:  No parameter changes.  2 VHR episodes the longest 5 seconds, probably VT.  Carelink transmissions every 3 months.  ROV 6 months with Dr. Ladona Ridgel in [place]. Altha Harm, LPN  April 10, 2010 8:53 AM  MD Comments:  Agree with above.

## 2010-10-08 NOTE — Miscellaneous (Signed)
Summary: LABS CMP,TSH 07/11/2009  Clinical Lists Changes  Observations: Added new observation of CALCIUM: 9.0 mg/dL (03/07/1600 09:32) Added new observation of ALBUMIN: 4.1 g/dL (35/57/3220 25:42) Added new observation of PROTEIN, TOT: 6.3 g/dL (70/62/3762 83:15) Added new observation of SGPT (ALT): 19 units/L (07/11/2009 15:22) Added new observation of SGOT (AST): 24 units/L (07/11/2009 15:22) Added new observation of ALK PHOS: 69 units/L (07/11/2009 15:22) Added new observation of CREATININE: 0.77 mg/dL (17/61/6073 71:06) Added new observation of BUN: 15 mg/dL (26/94/8546 27:03) Added new observation of BG RANDOM: 90 mg/dL (50/05/3817 29:93) Added new observation of CO2 PLSM/SER: 24 meq/L (07/11/2009 15:22) Added new observation of CL SERUM: 98 meq/L (07/11/2009 15:22) Added new observation of K SERUM: 4.7 meq/L (07/11/2009 15:22) Added new observation of NA: 133 meq/L (07/11/2009 15:22) Added new observation of TSH: 7.800 microintl units/mL (07/11/2009 15:22)

## 2010-10-08 NOTE — Assessment & Plan Note (Signed)
Summary: ov in 6 weeks/weakness/wt check/ss   Visit Type:  Follow-up Visit Primary Care Provider:  fagan  Chief Complaint:  follow up- still having same problems. having alot of gas. Finished hpylori meds.  History of Present Illness: Followup weight loss anorexia. Recent workup demonstrated both Candida esophagitis and H. pylori gastritis. We finally got him through treatments; pt had a hard time keeping the medication regimen straight. He developed a rash with Diflucan - we had to switch to itraconazole. He's now completed drug therapy and is not taking any GI meds including acid suppression therapy at  this time. He had some sludge in his gallbladder on ultrasound. Also, upper GI series demonstrated eventration of the left diaphragm a high riding stomach but no paraesophageal hernia etc. He states he is feeling better appetite may have gotten a little better recently. His weight is up to 2 pounds since his last visit here. He's having no abdominal  pain whatsoever - he denies early satiety- he denies melena rectal bleeding. The upper GI series also demonstrated spontaneous GER. Previously placed on Nexium by Dr Ouida Sills but but is not taking anything as stated above. His wife is in the [facility] center after having a knee replacement. She primarily prepared the meals along the way.  Current Problems (verified): 1)  Gerd  (ICD-530.81) 2)  Weight Loss, Abnormal  (ICD-783.21) 3)  Atherosclerotic Cardiovascular Disease-nonobstructive  (ICD-429.2) 4)  Dyslipidemia  (ICD-272.4) 5)  Hypertension  (ICD-401.1) 6)  Aortic Stenosis, Mild  (ICD-424.1) 7)  Cerebrovascular Disease  (ICD-437.9) 8)  Pacemaker, Permanent  (ICD-V45.01) 9)  Second Degree Av Block, Mobitz II  (ICD-426.12) 10)  Peripheral Vascular Disease  (ICD-443.9) 11)  Anemia  (ICD-285.9) 12)  Dizziness  (ICD-780.4) 13)  Tobacco Abuse, Hx of  (ICD-V15.82) 14)  Hx of Pneumonia  (ICD-486) 15)  Carpal Tunnel Syndrome, Hx of  (ICD-V12.49) 16)   Osteoarthritis  (ICD-715.90) 17)  Hypothyroidism  (ICD-244.9) 18)  Cancer, Larynx  (ICD-161.9)  Current Medications (verified): 1)  Levothroid 150 Mcg Tabs (Levothyroxine Sodium) .... Take 1 Tab Daily  Allergies (verified): No Known Drug Allergies  Past History:  Past Surgical History: Last updated: 06/17/2010  status post laryngectomy and radiation therapy in       1988.  Appendectomy in 1973.   Arthroscopic left knee surgery.  bilat knee replacements 19 years ago Bilateral cataract surgery.   Decompression right median nerve.01/08/2005  Family History: Last updated: 06/17/2010 No known family history of colorectal carcinoma, IBD, liver or chronic GI problems.  Father: (deceased 23) leukemia Mother: (deceased 68) CVA Siblings: had 8 DM, CVA  Social History: Last updated: 06/17/2010 The patient lives in [place] with his spouse x 64 yrs.  He has a remote history of tobacco but quit 30 years ago.  He denies alcohol or drug use. Retired from [employer].  Risk Factors: Alcohol Use: 0 (09/20/2009)  Risk Factors: Smoking Status: quit > 6 months (09/20/2009)  Past Medical History:  1. Nonobstructive coronary artery disease: 09/2008-50% proximal and 40% mid LAD; 25% circumflex; 30% RCA; mild global LV dysfunction with EF of 45%.  No aortic stenosis.  2. Mild aortic stenosis   3. Peripheral vascular disease with a 70% innominate artery stenosis and nonobstructive carotid stenosis.   4. Hypothyroidism.   5. Degenerative joint disease status post bilateral total knee replacements.   6. 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 Tobacco abuse-remote 8.  GERD 9. DYSLIPIDEMIA 10. OSTEOARTHRITIS 11. CANCER, LARYNX  12.  hpylori  Vital Signs:  Patient profile:   77 year old male Height:      69 inches Weight:      172 pounds BMI:     25.49 Temp:     98.0 degrees F oral Pulse rate:   60 / minute BP sitting:   138 / 80  (left arm) Cuff size:   regular  Vitals  Entered By: Hendricks Limes LPN (August 07, 2010 9:21 AM)  Physical Exam  General:  pleasant alert gentleman appears in no acute distress. Abdomen:  flat positive bowel sounds soft nontender without mass or organomegaly  Impression & Recommendations: Impression: History of H. pylori gastritis and Candida esophagitis/  S/P treatment for both. Combination of the these infections likely contributing to anorexia and weight loss. His workup has otherwise been negative. I am optimistic he will rebound.  He does have gastroesophageal reflux disease- untreated currently.  Recommendations: Liberalized/encouraged protrin/calorie intake.  Resume Nexium 40 mg orally daily  Given a one-month supply samples and a prescription  We'll have him come back here in one month and get his weight checked and we'll plan to see him back here in the office in 8 weeks.  Appended Document: Orders Update    Clinical Lists Changes  Problems: Added new problem of CANDIDIASIS, ESOPHAGEAL (ICD-112.84) Added new problem of HELICOBACTER PYLORI GASTRITIS (ICD-041.86) Orders: Added new Service order of Est. Patient Level III (42706) - Signed

## 2010-10-08 NOTE — Assessment & Plan Note (Signed)
Summary: F1Y   Visit Type:  Follow-up Primary Provider:  Carylon Perches   History of Present Illness: Andre Holder returns to the office as scheduled for continued assessment and treatment of coronary artery disease, previously mild, conduction system disease, aortic stenosis, and hyperlipidemia. Since his last visit, he has not required evaluation in the emergency department nor hospitalization; however, he raises quite a few health issues during this visit.  He reports episodic dizziness, which is not clearly presyncope nor is it clearly vertigo.  His major problem is a sense of imbalance, but he has not fallen.  This is been going on for quite some time, but he cannot specify exactly how long.  He also reports right lower quadrant pain.  This is present virtually continuously although it waxes and wanes.  It is exacerbated by walking.  He experiences pain along the left trapezius ridge, also somewhat worse with activity.  He also notes chronic discomfort in the left ankle.  He wonders about the adequacy of his cholesterol control.  He denies chest discomfort, dyspnea on exertion or syncope.  Patient also describes weight loss and anorexia.  His weight was in fact 231 at his first visit to the [place] practice in 1991.  In recent years, his lowest weight was 189; current weight is 182.  He denies a change in bowel habit, nausea, emesis or abdominal pain.  Records obtained from Dr. Alonza Smoker office and reviewed.  Blood pressure measurements have always been normal.  Recent laboratory results were added to the EMR.  None of his lab tests are significantly abnormal except for the presence of a moderate anemia.     Current Medications (verified): 1)  Levothroid 150 Mcg Tabs (Levothyroxine Sodium) .... Take 1 Tab Daily 2)  Multivitamins   Tabs (Multiple Vitamin) .Marland Kitchen.. 1 By Mouth Once Daily 3)  Simvastatin 40 Mg Tabs (Simvastatin) .... Take One Tablet By Mouth Daily At Bedtime 4)  Aspirin 81 Mg  Tbec (Aspirin) .... Take Occasionally 5)  Vitamin D3 .Marland Kitchen.. 1 By Mouth Once Daily 6)  Bone  Density .Marland Kitchen.. 1 By Mouth Once Daily 7)  Hydrocodone-Acetaminophen 5-500 Mg Tabs (Hydrocodone-Acetaminophen) .... Take As Needed  Allergies (verified): No Known Drug Allergies  Past History:  PMH, FH, and Social History reviewed and updated.  Past Medical History:  1. Nonobstructive coronary artery disease: 09/2008-50% proximal and 40% mid LAD; 25% circumflex; 30% RCA; mild global LV dysfunction with EF of 45%.  No aortic stenosis.  2. Mild aortic stenosis   3. Peripheral vascular disease with a 70% innominate artery stenosis and nonobstructive carotid stenosis.   4. Hypothyroidism.   5. Degenerative joint disease status post bilateral total knee replacements.   6. 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 Tobacco abuse-remote  Review of Systems       See history of present illness.  Vital Signs:  Patient profile:   77 year old male Weight:      182 pounds BMI:     26.97 Pulse rate:   64 / minute BP sitting:   155 / 75  (right arm)  Vitals Entered By: Dreama Saa, CNA (April 08, 2010 10:56 AM)  Physical Exam  General:  Proportionate height and weight and; well developed; no acute distress:   Neck-No JVD; status post laryngectomy with tracheostomy stoma Lungs-No tachypnea, no rales; no rhonchi; no wheezes: Cardiovascular-normal PMI; normal S1; decreased A2; grade 3/6 nearly holosystolic murmur at the cardiac base heard widely  across the precordium and radiating to the carotids Abdomen-BS normal; soft and non-tender without masses or organomegaly:  Musculoskeletal-No deformities, no cyanosis or clubbing; right lower quadrant pain reproduced with abduction and flexion of hip Neurologic-Normal cranial nerves; symmetric strength and tone:  Skin-Warm, no significant lesions: Extremities-Nl distal pulses; no edema:     CT Scan  Procedure date:  02/08/2010  Findings:        IMPRESSION:    1.  No pulmonary emboli or acute abnormality.   2.  Stable large left diaphragmatic eventration and associated left   basilar atelectasis or scarring.   3.  Stable bilateral adrenal hyperplasia.    Read By:  Darrol Angel,  M.D.  CXR  Procedure date:  02/08/2010  Findings:       IMPRESSION:   Cardiomegaly without focal acute finding. If the patient's symptoms   continue, consider PA and lateral chest radiographs obtained at   full inspiration when the patient is clinically able.    Read By:  Harrel Lemon,  MD  PPM Specifications Following MD:  Hillis Range, MD     PPM Vendor:  [employer]     PPM Model Number:  [id number]     PPM Serial Number:  [id number] PPM DOI:  09/27/2008     PPM Implanting MD:  Hillis Range, MD  Lead 1    Location: RA     DOI: 09/27/2008     Model #: [id number]     Serial #: [id number]     Status: active Lead 2    Location: RV     DOI: 09/27/2008     Model #: [id number]     Serial #: [id number]     Status: active  Magnet Response Rate:  BOL 85 ERI  65  Indications:  Sinus node dysfunction   PPM Follow Up Pacer Dependent:  No      Episodes Coumadin:  No  Parameters Mode:  DDD     Lower Rate Limit:  60     Upper Rate Limit:  130 Paced AV Delay:  320     Sensed AV Delay:  300  Impression & Recommendations:  Problem # 1:  DIZZINESS (ICD-780.4) No orthostatic change in blood pressure.  Symptoms are mild and only one among many that could require extensive testing.  Unless dizziness significantly interferes in his quality of life or results in a fall or syncope, I would not perform additional testing.  Problem # 2:  HYPERTENSION (ICD-401.1) Blood pressure significantly elevated at this visit, but has not been in the past.  We will collect additional measurements and ask Andre Holder to return in one month for a recheck by the cardiology nurses.  If values remain elevated, appropriate antihypertensive therapy will be added to his regime.  Problem #  3:  DYSLIPIDEMIA (ICD-272.4) Lipid profile will be obtained.  Problem # 4:  AORTIC STENOSIS, MILD (ICD-424.1) Echocardiography verifies that aortic stenosis remains very mild.  I am surprised that he did not have at least a small gradient at cardiac catheterization in 2010.  His aortic valve disease will probably never be a significant problem for him in terms of hemodynamic compromise.  Problem # 5:  ATHEROSCLEROTIC CARDIOVASCULAR DISEASE-NONOBSTRUCTIVE (ICD-429.2) No symptoms to suggest progression of previously mild coronary disease.  We will continue to control cardiovascular risk factors to the extent possible.  Problem # 6:  Weight loss It sounds as if this has been a gradual process and is not clearly  indicative of any acute health issues.  Basic laboratory studies will be obtained except for thyroid function studies, which are apparently being followed by Dr. Ouida Sills.  I will plan to reassess this very nice gentleman in one year.  He is advised to call should any of his cardiopulmonary symptoms worsen.  Other Orders: Future Orders: T-Lipid Profile ([id number]) ... 04/09/2010 T-Comprehensive Metabolic Panel [id number]) ... 04/09/2010 T-CBC w/Diff ([id number]) ... 04/09/2010  Patient Instructions: 1)  Your physician recommends that you schedule a follow-up appointment in: 1 year 2)  Your physician recommends that you return for lab work in: tomorrow 3)  Your physician has requested that you regularly monitor and record your blood pressure readings at home.  Please use the same machine at the same time of day to check your readings and record them to bring to your follow-up visit. 4)  You have been referred to nurse visit in 1 month, please bring bp diary to nurse visit  Prescriptions: SIMVASTATIN 40 MG TABS (SIMVASTATIN) Take one tablet by mouth daily at bedtime  #30 x 6   Entered by:   Teressa Lower RN   Authorized by:   Kathlen Brunswick, MD, [facility]   Signed by:   Teressa Lower  RN on 04/10/2010   Method used:   Electronically to        [facility] (retail)       [address]       [place], [place]  [address]       Ph: [phone]       Fax: [phone]   RxID:   [id number]

## 2010-10-08 NOTE — Letter (Signed)
Summary: [facility] [employer]  [address] [place] [address]   Phone: [phone]  Fax: [phone]     April 08, 2010 MRN: [id number]   Andre Holder [address] [place], [place]  [address]      YOUR LAB WORK IS DUE   April 09, 2010  Please go to [facility], located across the street from [facility] on the second floor.  Hours are Monday - Friday 7am until 7:30pm         Saturday 8am until 12noon    _X_  DO NOT EAT OR DRINK AFTER MIDNIGHT EVENING PRIOR TO LABWORK  __ YOUR LABWORK IS NOT FASTING --YOU MAY EAT PRIOR TO LABWORK

## 2010-10-08 NOTE — Progress Notes (Signed)
Summary: pt still not feeling well  Phone Note Call from Patient   Caller: Patient Summary of Call: Pt came by office first thing this AM and said that he was still feeling horrible. I asked him to explain. He said he is still having some nausea and some dizziness, back pain and knee problems. I told him it sound like he needs to see his PCP. He is within  a couple of days of completing the itraconazole. I asked him to complete that and give me a call and i will call in the couple of  days worth of the 3 prevpak meds (which he started before he was supposed to). I told him he will probably not feel alot better until he gets treated for the H.Pylori, and he is near starting on that treatment. (He had a bag of Diflucan which he had to stop because of rash, and asked me what he could do with it. He is upset because he pd over $200.00 for it and the pharmacy will not return. I told him next time he gets Rx for something he has never had and it is expensive, i would just try a few at the time first.) Initial call taken by: Cloria Spring LPN,  July 15, 2010 8:42 AM     Appended Document: pt still not feeling well above noted; he may not neccessrily feel well while on antibiotic therapy - but do expect him to rebound once antifungal and antihp therapy is behind him  Appended Document: pt still not feeling well Informed pt's wife.

## 2010-10-08 NOTE — Progress Notes (Signed)
Summary: phone note/ pt still having weakness  Phone Note Call from Patient   Caller: Spouse Summary of Call: T/C from pt's spouse, saying that he says he is not feeling well, although she said he has been doing more things. He made a stew Friday and has been kindly tired since. He  has had 5 doses of the itraconmazole and his rash is much improved. She just wanted Dr. Jena Gauss to know, since he complains of weakness and give out so much. Please advise! Initial call taken by: Cloria Spring LPN,  July 08, 2010 9:43 AM     Appended Document: phone note/ pt still having weakness continue regimen for candida esophagitis and then start over w hp treatment ; ov 6 weeks; pt needs to maintain good nutritional intake  Appended Document: phone note/ pt still having weakness Pt 's wife informed.   Appended Document: phone note/ pt still having weakness pt's wife is aware of appt for 11/30 @ 1030 w/RMR

## 2010-10-08 NOTE — Cardiovascular Report (Signed)
Summary: [facility]  [facility]   Imported By: Faythe Ghee 09/21/2009 08:52:33  _____________________________________________________________________  External Attachment:    Type:   Image     Comment:   External Document

## 2010-10-08 NOTE — Assessment & Plan Note (Signed)
Summary: DIFF EATING/ABD EATING/LAW   Visit Type:  Initial Consult Referring Bilal Manzer:  Ouida Sills Primary Care Dayln [place]:  Ouida Sills  Chief Complaint:  abd pain/difficulty eating.  History of Present Illness: 77 y/o caucasian male w/ unintentional 40# weight loss in past 9-10 months.  c/o burning  indigestion constant x 1 month.  Denies nausea or vomiting.  Denies dysphagia or odynophagia. Denies fever or chills.  c/o anorexia "scared to eat" w/ c/o heartburn.  Taking Zantac no relief after 1 hr.  Now just started Nexium 3 days ago, seems to help some.  BM normal qod, without rectal bleeding or melena.  Denies constipation or diarrhea.  Last colonoscopy 12 years ago Dr Karilyn Cota normal per pt.    CT abd/pelvis w/ IV contrast->constipation, progressive severe atherosclerosis @ SMA, celiac/IMA patent, prostatamegaly CBC->hgb 12.2, hct 38.1, eos 9% CMP normal  Current Problems (verified): 1)  Gerd  (ICD-530.81) 2)  Weight Loss, Abnormal  (ICD-783.21) 3)  Atherosclerotic Cardiovascular Disease-nonobstructive  (ICD-429.2) 4)  Dyslipidemia  (ICD-272.4) 5)  Hypertension  (ICD-401.1) 6)  Aortic Stenosis, Mild  (ICD-424.1) 7)  Cerebrovascular Disease  (ICD-437.9) 8)  Pacemaker  (ICD-V45.Marland Kitchen01) 9)  Second Degree Av Block, Mobitz II  (ICD-426.12) 10)  Peripheral Vascular Disease  (ICD-443.9) 11)  Anemia  (ICD-285.9) 12)  Dizziness  (ICD-780.4) 13)  Tobacco Abuse, Hx of  (ICD-V15.82) 14)  Hx of Pneumonia  (ICD-486) 15)  Carpal Tunnel Syndrome, Hx of  (ICD-V12.49) 16)  Osteoarthritis  (ICD-715.90) 17)  Hypothyroidism  (ICD-244.9) 18)  Cancer, Larynx  (ICD-161.9)  Current Medications (verified): 1)  Levothroid 150 Mcg Tabs (Levothyroxine Sodium) .... Take 1 Tab Daily 2)  Multivitamins   Tabs (Multiple Vitamin) .Marland Kitchen.. 1 By Mouth Once Daily 3)  Simvastatin 40 Mg Tabs (Simvastatin) .... Take One Tablet By Mouth Daily At Bedtime 4)  Aspirin 81 Mg Tbec (Aspirin) .... Take Occasionally 5)  Vitamin D3 .Marland Kitchen.. 1 By  Mouth Once Daily 6)  Bone  Density .Marland Kitchen.. 1 By Mouth Once Daily 7)  Hydrocodone-Acetaminophen 5-500 Mg Tabs (Hydrocodone-Acetaminophen) .... Take As Needed 8)  Era Bumpers .Marland Kitchen.. 3 Tablets By Mouth Two Times A Day 9)  True Aloe .Marland Kitchen.. 2 Tablets By Mouth Two Times A Day 10)  Nexium 40 Mg Cpdr (Esomeprazole Magnesium) .... Take 1 Tablet By Mouth Once A Day 11)  Co Q 10 100 Mg .... Take 1 Tablet By Mouth Once A Day 12)  Omega Three .... One Tablet Every Other Day 13)  L-Arginine .... One Tablet Daily  Allergies (verified): No Known Drug Allergies  Past History:  Past Medical History:  1. Nonobstructive coronary artery disease: 09/2008-50% proximal and 40% mid LAD; 25% circumflex; 30% RCA; mild global LV dysfunction with EF of 45%.  No aortic stenosis.  2. Mild aortic stenosis   3. Peripheral vascular disease with a 70% innominate artery stenosis and nonobstructive carotid stenosis.   4. Hypothyroidism.   5. Degenerative joint disease status post bilateral total knee replacements.   6. 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 Tobacco abuse-remote 8.  GERD 9. DYSLIPIDEMIA 10. OSTEOARTHRITIS 11. CANCER, LARYNX   Past Surgical History:  status post laryngectomy and radiation therapy in       1988.  Appendectomy in 1973.   Arthroscopic left knee surgery.  bilat knee replacements 19 years ago Bilateral cataract surgery.   Decompression right median nerve.01/08/2005  Family History: No known family history of colorectal carcinoma, IBD, liver or chronic GI problems.  Father: (deceased  27) leukemia Mother: (deceased 67) CVA Siblings: had 8 DM, CVA  Social History: The patient lives in [place] with his spouse x 64 yrs.  He has a remote history of tobacco but quit 30 years ago.  He denies alcohol or drug use. Retired from [employer].  Review of Systems General:  Denies fever, chills, sweats, fatigue, weakness, and malaise. CV:  Denies chest pains, angina, palpitations,  syncope, dyspnea on exertion, orthopnea, PND, peripheral edema, and claudication. Resp:  Denies dyspnea at rest, dyspnea with exercise, cough, sputum, wheezing, coughing up blood, and pleurisy. GI:  See HPI; Denies jaundice, gas/bloating, black BMs, and fecal incontinence. GU:  Denies urinary burning, blood in urine, urinary frequency, urinary hesitancy, nocturnal urination, and urinary incontinence. MS:  Denies joint pain / LOM, joint swelling, joint stiffness, joint deformity, low back pain, muscle weakness, muscle cramps, muscle atrophy, leg pain at night, leg pain with exertion, and shoulder pain / LOM hand / wrist pain (CTS). Derm:  Denies rash, itching, dry skin, hives, moles, warts, and unhealing ulcers. Psych:  Denies depression, anxiety, memory loss, suicidal ideation, hallucinations, paranoia, phobia, and confusion. Heme:  Denies bruising, bleeding, and enlarged lymph nodes.  Vital Signs:  Patient profile:   77 year old male Height:      69 inches Weight:      170 pounds BMI:     25.20 Temp:     99.1 degrees F oral Pulse rate:   72 / minute BP sitting:   130 / 64  (left arm) Cuff size:   large  Vitals Entered By: Cloria Spring LPN (June 17, 2010 10:24 AM)  Physical Exam  General:  Thin, elderly caucasian male in NAD Head:  Normocephalic and atraumatic. Eyes:  Sclera clear, no icterus. Ears:  Normal auditory acuity. Nose:  No deformity, discharge,  or lesions. Mouth:  No deformity or lesions, dentition normal. Neck:  trach with some clear mucous exudate Lungs:  Clear throughout to auscultation. Heart:  Regular rate and rhythm; no murmurs, rubs,  or bruits. Abdomen:  Soft, nontender and nondistended. No masses, hepatosplenomegaly or hernias noted. Normal bowel sounds.without guarding and without rebound.   Rectal:  deferred until time of colonoscopy.   Msk:  Symmetrical with no gross deformities. Normal posture. Pulses:  Normal pulses noted. Extremities:  clubbing, no  edema or cyanosis Neurologic:  Alert and  oriented x4;  grossly normal neurologically. Skin:  Intact without significant lesions or rashes. Cervical Nodes:  No significant cervical adenopathy. Psych:  Alert and cooperative. Normal mood and affect.  Impression & Recommendations:  Problem # 1:  WEIGHT LOSS, ABNORMAL (ICD-783.21) 77 y/o caucasian male w/ 40# weight loss in 9-10 mo w/ severe heartburn post-prandially.  Also has severe SMA atherosclerosis & peripheral eosinophilia.  Differentials include ischemia, malignancy, refractory GERD, h pylori gastritis, PUD, gallbadder etiology, non-GI malignancy or eosinophilic esophagitis.  Reviewed CT regarding SMA atherosclerosis with Dr Jena Gauss.  Pt will need EGD &  colonoscopy and pending findings may be sent to Interventional Radiology for further evaluation of SMA.     EGD and colonoscopy to be performed by Dr. Jonathon Bellows in the near future.  I have discussed risks and benefits which include, but are not limited to, bleeding, infection, perforation, or medication reaction.  The patient agrees with this plan and consent will be obtained.   Orders: Consultation Level IV (07371)  Problem # 2:  GERD (ICD-530.81) See #1  Patient Instructions: 1)  To ER if severe pain  Appended Document: DIFF EATING/ABD EATING/LAW Pt scheduled for 06/20/10@11 :45am

## 2010-10-08 NOTE — Letter (Signed)
Summary: BP LOG  BP LOG   Imported By: Faythe Ghee 05/10/2010 10:28:30  _____________________________________________________________________  External Attachment:    Type:   Image     Comment:   External Document

## 2010-10-08 NOTE — Progress Notes (Signed)
Summary: pt still mixed up on his meds  Phone Note Outgoing Call   Summary of Call: I called pt to see when he would need remainder of the meds to complete PREVPAK called in since he had taken some before he was supposed to. His wife told me that he was so mixed up on his meds and she didn't know what to do. I told her to have him bring them all and i would look at them. He did bring them over. He had taken almost half of the PrevPAK meds and still had 7 pills left on the itraconazole. I taped up the other meds, told him to finish the itraconazole. I called Riki Rusk at [facility]. I ordered the remainder of the Amoxicillin #28, Biaxin # 15, and Prevacid # 15. Riki Rusk is going to check  with [facility] and have them package them for him to take after he completes the itraconazole.  Initial call taken by: Cloria Spring LPN,  July 19, 2010 11:18 AM     Appended Document: pt still mixed up on his meds Pt 's wife called and i informed her of the above. Also, she said that [facility] in process of fixing pt's meds. I urged her to have them do all of his meds, she said he gives her a hard time with the meds.

## 2010-10-08 NOTE — Miscellaneous (Signed)
Summary: CHEST XRAY 02/08/2010,ECHO 09/17/2009  Clinical Lists Changes  Observations: Added new observation of CXR RESULTS:   Clinical Data: Chest pain    PORTABLE CHEST - 1 VIEW    Comparison: 09/28/2008    Findings: Dual lead left-sided pacer noted.  Enlargement of the   cardiomediastinal silhouette is noted without focal opacity or   edema.  Left hemidiaphragmatic eventration / elevation again noted.   Remote right-sided rib fractures are not as well seen and on the   prior study.    IMPRESSION:   Cardiomegaly without focal acute finding. If the patient's symptoms   continue, consider PA and lateral chest radiographs obtained at   full inspiration when the patient is clinically able.    Read By:  Harrel Lemon,  MD   Released By:  Harrel Lemon,  MD  (02/08/2010 17:07) Added new observation of ECHOINTERP:  Study Conclusions    - Left ventricle: The cavity size was normal. Wall thickness was     increased in a pattern of mild LVH. Systolic function was normal.     The estimated ejection fraction was in the range of 55% to 60%.     Probable hypokinesis of the basal inferior myocardium.   - Aortic valve: Moderately calcified annulus. Mildly thickened,     mildly calcified leaflets. Trivial regurgitation. Valve area:     1.64cm^2(VTI). Valve area: 1.63cm^2 (Vmax).   - Mitral valve: Mildly to moderately calcified annulus. Mild     regurgitation.   - Left atrium: The atrium was mildly dilated.   - Right atrium: The atrium was mildly dilated.  (09/21/2009 17:08)      CXR  Procedure date:  02/08/2010  Findings:        Clinical Data: Chest pain    PORTABLE CHEST - 1 VIEW    Comparison: 09/28/2008    Findings: Dual lead left-sided pacer noted.  Enlargement of the   cardiomediastinal silhouette is noted without focal opacity or   edema.  Left hemidiaphragmatic eventration / elevation again noted.   Remote right-sided rib fractures are not as well seen and on  the   prior study.    IMPRESSION:   Cardiomegaly without focal acute finding. If the patient's symptoms   continue, consider PA and lateral chest radiographs obtained at   full inspiration when the patient is clinically able.    Read By:  Harrel Lemon,  MD   Released By:  Harrel Lemon,  MD   Echocardiogram  Procedure date:  09/21/2009  Findings:       Study Conclusions    - Left ventricle: The cavity size was normal. Wall thickness was     increased in a pattern of mild LVH. Systolic function was normal.     The estimated ejection fraction was in the range of 55% to 60%.     Probable hypokinesis of the basal inferior myocardium.   - Aortic valve: Moderately calcified annulus. Mildly thickened,     mildly calcified leaflets. Trivial regurgitation. Valve area:     1.64cm^2(VTI). Valve area: 1.63cm^2 (Vmax).   - Mitral valve: Mildly to moderately calcified annulus. Mild     regurgitation.   - Left atrium: The atrium was mildly dilated.   - Right atrium: The atrium was mildly dilated.

## 2010-10-08 NOTE — Progress Notes (Signed)
Summary: call from Dr. Ouida Sills  Phone Note From Other Clinic Call back at     Caller: Dr. Carylon Perches Summary of Call: Dr. Ouida Sills called regarding patient. He has lost another 3 pounds. He thought pt already treated for H.Pylori. Explained confusion with Dr. Ouida Sills, patient developed rash so Diflucan switched to itraconazole. Prevpak to be started after itraconazole finished. Patient still feels poorly.  Dr. Jena Gauss off today.   Go ahead and schedule UGI series as per Dr. Luvenia Starch previous plan.  Initial call taken by: Leanna Battles. Dixon Boos,  July 17, 2010 9:08 AM     Appended Document: call from Dr. Ouida Sills Pt scheduled for UGI/ABD U/S on 07/18/10@8 :30am..This was originally scheduled back in October by Trula Ore in [place] and the pt didn't know anything about it.Pt informed about appt  Appended Document: call from Dr. Ouida Sills Thanks.

## 2010-10-08 NOTE — Assessment & Plan Note (Signed)
Summary: pt having problems with pacemaker coming on and heaviness in ...   Visit Type:  Follow-up Primary Provider:  Carylon Perches   History of Present Illness: Mr Day is a 77 y/o CM with known history of throat cancer, for which his larynx was removed.  He uses an amplifier to speak.  He also has a history of mild CAD with a 40% lesion in the proximal LAD per cath in 2001, AoV disease, conduction system disease (Mobitz  II AV Block) for which he has had a Medtronic Dual Chamber PMK placed 09/2008. He is here because of 3 days of "vibrating" in his heart.  He says that he was awakened 2 mornings ago with the sensation lasting 10 minutes. Came back about 30 minutes after that, and then did not return until the following day.  On futher questioning, he also describes this as a "bubbling" in the heart.  He is concerned about his PMK and is here for evaluation.  He states that he has not ever had this sensation before.  He does not have associated CP, dizziness, NV, of SOB associated with this. He admits to lack of energy over the last month.  Otherwise he is asymptomatic.    Preventive Screening-Counseling & Management  Alcohol-Tobacco     Alcohol drinks/day: 0     Smoking Status: quit > 6 months  Current Problems (verified): 1)  Orthostatic Dizziness  (ICD-780.4) 2)  Aortic Stenosis, Mild  (ICD-424.1) 3)  Cerebrovascular Disease  (ICD-437.9) 4)  Tobacco Abuse, Hx of  (ICD-V15.82) 5)  Peripheral Vascular Disease  (ICD-443.9) 6)  Cad  (ICD-414.00) 7)  Second Degree Av Block, Mobitz II  (ICD-426.12) 8)  Pneumonia  (ICD-486) 9)  Carpal Tunnel Syndrome, Hx of  (ICD-V12.49) 10)  Osteoarthritis  (ICD-715.90) 11)  Hypothyroidism  (ICD-244.9) 12)  Cancer, Larynx  (ICD-161.9)  Current Medications (verified): 1)  Levothroid 200 Mcg Tabs (Levothyroxine Sodium) .Marland Kitchen.. 1 By Mouth Once Daily 2)  Multivitamins   Tabs (Multiple Vitamin) .Marland Kitchen.. 1 By Mouth Once Daily 3)  Diazepam 2 Mg Tabs (Diazepam) .Marland Kitchen..  1 By Mouth Two Times A Day As Needed 4)  Simvastatin 40 Mg Tabs (Simvastatin) .... Take One Tablet By Mouth Daily At Bedtime 5)  Alpha-Lipoic Acid 200 Mg Caps (Alpha-Lipoic Acid) .Marland Kitchen.. 1 By Mouth Once Daily 6)  Aspirin 81 Mg Tbec (Aspirin) .... Take One Tablet By Mouth Daily 7)  Vitamin D3 .Marland Kitchen.. 1 By Mouth Once Daily 8)  Bone  Density .Marland Kitchen.. 1 By Mouth Once Daily 9)  Allopurinol 300 Mg Tabs (Allopurinol) .... Take 1 Tab Daily  Allergies (verified): No Known Drug Allergies  Past History:  Past medical, surgical, family and social histories (including risk factors) reviewed, and no changes noted (except as noted below).  Past Medical History: Reviewed history from 12/29/2008 and no changes required.  1. Nonobstructive coronary artery disease.   2. Mild aortic stenosis.   3. Peripheral vascular disease with a 70% innominate artery stenosis and nonobstructive carotid stenosis.   4. Hypothyroidism.   5. Degenerative joint disease status post bilateral total knee  replacements.   6. 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  status post laryngectomy and radiation therapy in       1988.  Appendectomy in 1973.   Arthroscopic left knee surgery.  Bilateral cataract surgery.   Decompression right median nerve.01/08/2005  Family History: Reviewed history from 12/29/2008 and  no changes required. stroke  Social History: Reviewed history from 12/29/2008 and no changes required. The patient lives in [place] with his spouse.  He has a remote history of tobacco but quit 30 years ago.  He denies alcohol or drug use. Alcohol drinks/day:  0 Smoking Status:  quit > 6 months  Review of Systems       abnormal vibrating. bubbling sensation in heart.  All other systems have been reviewed and are negative unless stated above.   Vital Signs:  Patient profile:   77 year old male Weight:      187 pounds Pulse  rate:   65 / minute BP sitting:   145 / 62  (right arm)  Vitals Entered By: Dreama Saa, CNA (September 20, 2009 2:33 PM)  Physical Exam  General:  Well developed, well nourished, in no acute distress. Head:  normocephalic and atraumatic Eyes:  PERRLA/EOM intact; conjunctiva and lids normal. Ears:  TM's intact and clear with normal canals and hearing Nose:  no deformity, discharge, inflammation, or lesions Mouth:  Teeth, gums and palate normal. Oral mucosa normal. Neck:  Cover over tracheal area. Lungs:  Clear bilaterally to auscultation and percussion. Heart:  Grade  2/6 DM loudest at primary aortic area.   Abdomen:  Bowel sounds positive; abdomen soft and non-tender without masses, organomegaly, or hernias noted. No hepatosplenomegaly. Msk:  Back normal, normal gait. Muscle strength and tone normal. Pulses:  pulses normal in all 4 extremities Extremities:  No clubbing or cyanosis. Neurologic:  Alert and oriented x 3. Psych:  Normal affect.   EKG  Procedure date:  09/20/2009  Findings:      Normal sinus rhythm with rate of: 63 bpm Left bundle branch block.    PPM Specifications Following MD:  Hillis Range, MD     PPM Vendor:  Medtronic     PPM Model Number:  ADDRL1     PPM Serial Number:  [id number] PPM DOI:  09/27/2008     PPM Implanting MD:  Hillis Range, MD  Lead 1    Location: RA     DOI: 09/27/2008     Model #: 4098     Serial #: [id number]     Status: active Lead 2    Location: RV     DOI: 09/27/2008     Model #: 6213     Serial #: [id number]     Status: active  Magnet Response Rate:  BOL 85 ERI  65  Indications:  Sinus node dysfunction   PPM Follow Up Remote Check?  No Battery Voltage:  2.79 V     Battery Est. Longevity:  10      Pacer Dependent:  No       PPM Device Measurements Atrium  Amplitude: 2.8 mV, Impedance: 537 ohms, Threshold: 0.75 V at 0.4 msec Right Ventricle  Amplitude: 11.2 mV, Impedance: 546 ohms, Threshold: 0.75 V at 0.4 msec  Episodes MS  Episodes:  1     Percent Mode Switch:  0     Coumadin:  No Ventricular High Rate:  0     Atrial Pacing:  30     Ventricular Pacing:  5  Parameters Mode:  DDD     Lower Rate Limit:  60     Upper Rate Limit:  130 Paced AV Delay:  320     Sensed AV Delay:  300 Tech Comments:  no changes made. Discussed findings with patient device incapable of vibration. Mode switch episode  less than 30 sec.  Impression & Recommendations:  Problem # 1:  PACEMAKER (ICD-V45.Marland Kitchen01) Medtronic Pacemaker rep, Barbara Cower has interrogated the PMK here in the office.  There are no signs of abnormal functioning.  Histogram does not show arrythmias.  No new programming is completed.  Problem # 2:  AORTIC STENOSIS, MILD (ICD-424.1) Will repeat echo as his AoV murmur is very prominent.  Uncertain if his vibrating, bubbling feeling is associated with AoV regurg or a GI component of flauts.  He will follow up with Dr. Dietrich Pates to discuss Echo results and need have further evaluation of his valve.   Orders: 2-D Echocardiogram (2D Echo)  Problem # 3:  CAD (ICD-414.00) His cath in 2001 showed promimal LAD stenosis only, with other vessels free of disease.  Once Echo completed, may consider stress myoview. He denies CP at this time. His updated medication list for this problem includes:    Aspirin 81 Mg Tbec (Aspirin) .Marland Kitchen... Take one tablet by mouth daily  Patient Instructions: 1)  Your physician recommends that you schedule a follow-up appointment in: After Echo 2)  Your physician recommends that you continue on your current medications as directed. Please refer to the Current Medication list given to you today. 3)  Your physician has requested that you have an echocardiogram.  Echocardiography is a painless test that uses sound waves to create images of your heart. It provides your doctor with information about the size and shape of your heart and how well your heart's chambers and valves are working.  This procedure takes  approximately one hour. There are no restrictions for this procedure.

## 2010-10-10 ENCOUNTER — Encounter (INDEPENDENT_AMBULATORY_CARE_PROVIDER_SITE_OTHER): Payer: Self-pay | Admitting: *Deleted

## 2010-10-10 NOTE — Cardiovascular Report (Signed)
Summary: Office Visit   Office Visit   Imported By: Roderic Ovens 09/27/2010 16:22:11  _____________________________________________________________________  External Attachment:    Type:   Image     Comment:   External Document

## 2010-10-10 NOTE — Procedures (Signed)
Summary: PACER CHECK   Current Medications (verified): 1)  Levothroid 150 Mcg Tabs (Levothyroxine Sodium) .... Take 1 Tab Daily  Allergies (verified): No Known Drug Allergies   PPM Specifications Following MD:  Lewayne Bunting, MD     PPM Vendor:  Medtronic     PPM Model Number:  ADDRL1     PPM Serial Number:  [id number] PPM DOI:  09/27/2008     PPM Implanting MD:  Hillis Range, MD  Lead 1    Location: RA     DOI: 09/27/2008     Model #: 7829     Serial #: [id number]     Status: active Lead 2    Location: RV     DOI: 09/27/2008     Model #: 8469     Serial #: [id number]     Status: active  Magnet Response Rate:  BOL 85 ERI  65  Indications:  Sinus node dysfunction   PPM Follow Up Remote Check?  No Battery Voltage:  2.80 V     Battery Est. Longevity:  13.5 years     Pacer Dependent:  No       PPM Device Measurements Atrium  Amplitude: 2.8 mV, Impedance: 500 ohms, Threshold: 0.5 V at 0.4 msec Right Ventricle  Amplitude: 15.68 mV, Impedance: 534 ohms, Threshold: 0.625 V at 0.4 msec  Episodes MS Episodes:  0     Percent Mode Switch:  0     Coumadin:  No Ventricular High Rate:  1     Atrial Pacing:  20.4%     Ventricular Pacing:  0.6%  Parameters Mode:  DDD     Lower Rate Limit:  60     Upper Rate Limit:  130 Paced AV Delay:  320     Sensed AV Delay:  300 Next Cardiology Appt Due:  03/09/2011 Tech Comments:  No parameter changes. Device function normal.  No Carelink @ this time.  1VHR episode 6 seconds  ROV 6 months with Dr. Ladona Ridgel in [place]. Altha Harm, LPN  September 20, 2010 8:15 AM

## 2010-10-10 NOTE — Letter (Signed)
Summary: Device-Delinquent Phone Journalist, newspaper, Main Office  [address]   [place], [place] [address]   Phone: [phone]  Fax: [phone]     September 17, 2010 MRN: [id number]   Andre Holder [address] [place], [place]  [address]   Dear [name],  According to our records, you were scheduled for a device phone transmission on 09-12-2010.     We did not receive any results from this check.  If you transmitted on your scheduled day, please call us to help troubleshoot your system.  If you forgot to send your transmission, please send one upon receipt of this letter.  Thank you,   Architectural technologist [facility]

## 2010-10-12 ENCOUNTER — Emergency Department (HOSPITAL_COMMUNITY)
Admission: EM | Admit: 2010-10-12 | Discharge: 2010-10-12 | Disposition: A | Discharge status: Home / Self Care | Payer: Medicare Other | Attending: Emergency Medicine | Admitting: Emergency Medicine

## 2010-10-12 DIAGNOSIS — I2510 Atherosclerotic heart disease of native coronary artery without angina pectoris: Secondary | ICD-10-CM | POA: Insufficient documentation

## 2010-10-12 DIAGNOSIS — Z8521 Personal history of malignant neoplasm of larynx: Secondary | ICD-10-CM | POA: Insufficient documentation

## 2010-10-12 DIAGNOSIS — Z79899 Other long term (current) drug therapy: Secondary | ICD-10-CM | POA: Insufficient documentation

## 2010-10-12 DIAGNOSIS — R109 Unspecified abdominal pain: Secondary | ICD-10-CM | POA: Insufficient documentation

## 2010-10-12 DIAGNOSIS — E039 Hypothyroidism, unspecified: Secondary | ICD-10-CM | POA: Insufficient documentation

## 2010-10-12 DIAGNOSIS — R339 Retention of urine, unspecified: Secondary | ICD-10-CM | POA: Insufficient documentation

## 2010-10-12 LAB — URINALYSIS, ROUTINE W REFLEX MICROSCOPIC
Bilirubin Urine: NEGATIVE
Ketones, ur: NEGATIVE mg/dL
Nitrite: NEGATIVE
Specific Gravity, Urine: 1.01 (ref 1.005–1.030)
Urobilinogen, UA: 0.2 mg/dL (ref 0.0–1.0)

## 2010-10-12 LAB — BASIC METABOLIC PANEL
Calcium: 9.1 mg/dL (ref 8.4–10.5)
GFR calc non Af Amer: >60 mL/min (ref >60)
Glucose, Bld: 156 mg/dL — ABNORMAL HIGH (ref 70–99)
Sodium: 130 mEq/L — ABNORMAL LOW (ref 135–145)

## 2010-10-12 LAB — URINE MICROSCOPIC-ADD ON

## 2010-10-13 ENCOUNTER — Emergency Department (HOSPITAL_COMMUNITY)
Admission: EM | Admit: 2010-10-13 | Discharge: 2010-10-13 | Disposition: A | Discharge status: Home / Self Care | Payer: Medicare Other | Attending: Emergency Medicine | Admitting: Emergency Medicine

## 2010-10-13 DIAGNOSIS — R339 Retention of urine, unspecified: Secondary | ICD-10-CM | POA: Insufficient documentation

## 2010-10-13 DIAGNOSIS — R300 Dysuria: Secondary | ICD-10-CM | POA: Insufficient documentation

## 2010-10-13 DIAGNOSIS — I2510 Atherosclerotic heart disease of native coronary artery without angina pectoris: Secondary | ICD-10-CM | POA: Insufficient documentation

## 2010-10-13 DIAGNOSIS — Z8521 Personal history of malignant neoplasm of larynx: Secondary | ICD-10-CM | POA: Insufficient documentation

## 2010-10-13 DIAGNOSIS — Z79899 Other long term (current) drug therapy: Secondary | ICD-10-CM | POA: Insufficient documentation

## 2010-10-13 LAB — URINE CULTURE
Colony Count: NO GROWTH
Culture  Setup Time: 201202042025
Culture: NO GROWTH

## 2010-10-14 ENCOUNTER — Ambulatory Visit: Payer: Self-pay | Admitting: Urgent Care

## 2010-10-15 ENCOUNTER — Emergency Department (HOSPITAL_COMMUNITY)
Admission: EM | Admit: 2010-10-15 | Discharge: 2010-10-15 | Disposition: A | Discharge status: Home / Self Care | Payer: Medicare Other | Attending: Emergency Medicine | Admitting: Emergency Medicine

## 2010-10-15 DIAGNOSIS — R51 Headache: Secondary | ICD-10-CM | POA: Insufficient documentation

## 2010-10-15 DIAGNOSIS — E039 Hypothyroidism, unspecified: Secondary | ICD-10-CM | POA: Insufficient documentation

## 2010-10-15 DIAGNOSIS — Z950 Presence of cardiac pacemaker: Secondary | ICD-10-CM | POA: Insufficient documentation

## 2010-10-15 DIAGNOSIS — I2510 Atherosclerotic heart disease of native coronary artery without angina pectoris: Secondary | ICD-10-CM | POA: Insufficient documentation

## 2010-10-15 DIAGNOSIS — Z79899 Other long term (current) drug therapy: Secondary | ICD-10-CM | POA: Insufficient documentation

## 2010-10-16 NOTE — Letter (Signed)
Summary: Recall Office Visit  [facility]  [address]   [place], [place] [address]   Phone: [phone]  Fax: [phone]      October 10, 2010   Andre Holder [address] [place], [place]  [address] 16-Aug-1923   Dear [name],   According to our records, it is time for you to schedule a follow-up office visit with [place].   At your convenience, please call [phone] to schedule an office visit. If you have any questions, concerns, or feel that this letter is in error, we would appreciate your call.   Sincerely,    Diana Eves  [facility] Ph: [phone]   Fax: [phone]

## 2010-10-30 ENCOUNTER — Ambulatory Visit: Payer: Medicare Other | Admitting: Gastroenterology

## 2010-11-20 LAB — KOH PREP

## 2010-11-21 LAB — POCT CARDIAC MARKERS
CKMB, poc: 1.9 ng/mL (ref 1.0–8.0)
Troponin i, poc: <0.05 ng/mL (ref 0.00–0.09)

## 2010-11-21 LAB — BASIC METABOLIC PANEL
BUN: 16 mg/dL (ref 6–23)
CO2: 24 mEq/L (ref 19–32)
Chloride: 95 mEq/L — ABNORMAL LOW (ref 96–112)
Creatinine, Ser: 0.7 mg/dL (ref 0.4–1.5)
Glucose, Bld: 104 mg/dL — ABNORMAL HIGH (ref 70–99)

## 2010-11-21 LAB — CBC
MCH: 33.5 pg (ref 26.0–34.0)
MCV: 98.3 fL (ref 78.0–100.0)
Platelets: 192 10*3/uL (ref 150–400)
RDW: 13.1 % (ref 11.5–15.5)

## 2010-11-21 LAB — DIFFERENTIAL
Basophils Absolute: 0 10*3/uL (ref 0.0–0.1)
Eosinophils Absolute: 0.1 10*3/uL (ref 0.0–0.7)
Eosinophils Relative: 1 % (ref 0–5)
Monocytes Absolute: 0.5 10*3/uL (ref 0.1–1.0)

## 2010-11-25 LAB — DIFFERENTIAL
Basophils Absolute: 0 10*3/uL (ref 0.0–0.1)
Eosinophils Absolute: 0.5 10*3/uL (ref 0.0–0.7)
Eosinophils Relative: 8 % — ABNORMAL HIGH (ref 0–5)
Lymphocytes Relative: 27 % (ref 12–46)
Lymphs Abs: 1.7 10*3/uL (ref 0.7–4.0)
Monocytes Absolute: 0.8 10*3/uL (ref 0.1–1.0)

## 2010-11-25 LAB — BASIC METABOLIC PANEL
BUN: 14 mg/dL (ref 6–23)
Chloride: 103 mEq/L (ref 96–112)
GFR calc non Af Amer: >60 mL/min (ref >60)
Glucose, Bld: 93 mg/dL (ref 70–99)
Potassium: 3.7 mEq/L (ref 3.5–5.1)
Sodium: 136 mEq/L (ref 135–145)

## 2010-11-25 LAB — D-DIMER, QUANTITATIVE: D-Dimer, Quant: 1.41 ug/mL-FEU — ABNORMAL HIGH (ref 0.00–0.48)

## 2010-11-25 LAB — POCT CARDIAC MARKERS
CKMB, poc: 1.9 ng/mL (ref 1.0–8.0)
Troponin i, poc: <0.05 ng/mL (ref 0.00–0.09)
Troponin i, poc: <0.05 ng/mL (ref 0.00–0.09)

## 2010-11-25 LAB — CBC
HCT: 31.9 % — ABNORMAL LOW (ref 39.0–52.0)
Hemoglobin: 10.8 g/dL — ABNORMAL LOW (ref 13.0–17.0)
MCV: 97.1 fL (ref 78.0–100.0)
Platelets: 253 10*3/uL (ref 150–400)
RDW: 13 % (ref 11.5–15.5)

## 2010-12-23 LAB — BASIC METABOLIC PANEL
BUN: 15 mg/dL (ref 6–23)
Calcium: 8.7 mg/dL (ref 8.4–10.5)
Calcium: 9.2 mg/dL (ref 8.4–10.5)
Chloride: 104 mEq/L (ref 96–112)
Creatinine, Ser: 0.88 mg/dL (ref 0.4–1.5)
GFR calc Af Amer: >60 mL/min (ref >60)
GFR calc non Af Amer: >60 mL/min (ref >60)
GFR calc non Af Amer: >60 mL/min (ref >60)
Glucose, Bld: 102 mg/dL — ABNORMAL HIGH (ref 70–99)
Potassium: 3.7 mEq/L (ref 3.5–5.1)
Sodium: 137 mEq/L (ref 135–145)
Sodium: 137 mEq/L (ref 135–145)
Sodium: 138 mEq/L (ref 135–145)

## 2010-12-23 LAB — COMPREHENSIVE METABOLIC PANEL
ALT: 16 U/L (ref 0–53)
AST: 23 U/L (ref 0–37)
Albumin: 3.5 g/dL (ref 3.5–5.2)
Alkaline Phosphatase: 70 U/L (ref 39–117)
Chloride: 102 mEq/L (ref 96–112)
Potassium: 3.9 mEq/L (ref 3.5–5.1)
Sodium: 131 mEq/L — ABNORMAL LOW (ref 135–145)
Total Bilirubin: 0.7 mg/dL (ref 0.3–1.2)
Total Protein: 5.9 g/dL — ABNORMAL LOW (ref 6.0–8.3)

## 2010-12-23 LAB — CBC
HCT: 33.1 % — ABNORMAL LOW (ref 39.0–52.0)
Hemoglobin: 11.1 g/dL — ABNORMAL LOW (ref 13.0–17.0)
Platelets: 243 10*3/uL (ref 150–400)
Platelets: 247 10*3/uL (ref 150–400)
RDW: 12.6 % (ref 11.5–15.5)
RDW: 12.7 % (ref 11.5–15.5)
WBC: 10.9 10*3/uL — ABNORMAL HIGH (ref 4.0–10.5)
WBC: 7.5 10*3/uL (ref 4.0–10.5)

## 2010-12-23 LAB — PROTIME-INR
INR: 1 (ref 0.00–1.49)
Prothrombin Time: 13.7 seconds (ref 11.6–15.2)

## 2010-12-23 LAB — CARDIAC PANEL(CRET KIN+CKTOT+MB+TROPI)
CK, MB: 1.9 ng/mL (ref 0.3–4.0)
CK, MB: 2.2 ng/mL (ref 0.3–4.0)
Relative Index: INVALID (ref 0.0–2.5)
Total CK: 72 U/L (ref 7–232)
Troponin I: 0.01 ng/mL (ref 0.00–0.06)
Troponin I: 0.01 ng/mL (ref 0.00–0.06)
Troponin I: 0.01 ng/mL (ref 0.00–0.06)

## 2010-12-23 LAB — POCT CARDIAC MARKERS: Myoglobin, poc: 93 ng/mL (ref 12–200)

## 2010-12-23 LAB — DIFFERENTIAL
Basophils Relative: 0 % (ref 0–1)
Eosinophils Absolute: 0 10*3/uL (ref 0.0–0.7)
Eosinophils Relative: 0 % (ref 0–5)
Monocytes Absolute: 0.2 10*3/uL (ref 0.1–1.0)
Monocytes Relative: 2 % — ABNORMAL LOW (ref 3–12)

## 2011-01-06 ENCOUNTER — Other Ambulatory Visit: Payer: Self-pay | Admitting: Cardiology

## 2011-01-06 DIAGNOSIS — E785 Hyperlipidemia, unspecified: Secondary | ICD-10-CM

## 2011-01-21 NOTE — Consult Note (Signed)
NAME:  Andre Holder, Andre Holder NO.:  [id number]   MEDICAL RECORD NO.:  [id number]          PATIENT TYPE:  INP   LOCATION:  A321                          FACILITY:  [facility]   PHYSICIAN:  Gerrit Friends. Dietrich Pates, MD, FACCDATE OF BIRTH:  06-Jul-1923   DATE OF CONSULTATION:  09/22/2008  DATE OF DISCHARGE:                                 CONSULTATION   REFERRING PHYSICIAN:  Kingsley Callander. Ouida Sills, M.D.   CARDIOLOGIST:  He previously saw Dr. Dietrich Pates in 2001.   REASON FOR REFERRAL:  Abnormal EKG.   HISTORY OF PRESENT ILLNESS:  [name] is an 77 year old male with a  history of nonobstructive CAD by cardiac catheterization in 2001 with  40% LAD lesion and normal LV function with an EF of 50%.  He had  abnormal carotid Dopplers and underwent angiography in October 2007 that  demonstrated minimal carotid stenosis bilaterally but a 70% innominate  artery stenosis.  He presented to [facility] Emergency Room  last night with complaints of sudden onset of itching and rash.  He was  treated for an allergic reaction in the emergency room and was actually  being discharged to home when he suffered a syncopal episode.  He was  not monitored at the time.  When he was hooked up to the monitor no  arrhythmia was documented.  He was admitted for obstruction and was  noted to have an abnormal EKG.  We were therefore asked to further  evaluate.  The patient tells me he has had a 1-week history of weakness.  This is new for him.  He states that he gives out with anything he does,  which is somewhat consistent with exertional shortness of breath.  He  specifically denies shortness of breath or chest pain, exertional nausea  or diaphoresis.  He denies orthopnea, PND or syncope.  He says he  developed sudden onset of pruritus yesterday after collecting wood.  He  states that he felt fine in the emergency room before his syncope.   PAST MEDICAL HISTORY:  As outlined above.  1. In addition, he has  hypothyroidism.  2. Osteoarthritis, status post bilateral total knee replacement.  3. Status post appendectomy.  4. Status post prostate surgery.  5. History of laryngeal carcinoma, status post laryngectomy.  6. Status post carpal tunnel surgery to both hands in the past.   MEDICATIONS AT HOME:  Synthroid 0.15 mg daily.   ALLERGIES:  No known drug allergies.   SOCIAL HISTORY:  He lives in [place] with his wife.  He is retired.  He quit smoking 30 years ago.  He denies alcohol abuse.   FAMILY HISTORY:  Significant for his mother dying of a stroke in her  30s.   REVIEW OF SYSTEMS:  Please see HPI.  Denies fever, chills, headache,  dysuria,  hematuria, bright red blood per rectum or melena, dysphagia,  cough.  He does note some left leg pain from time to time not with  exertion.  He notes a few excoriations to his bilateral hands from where  he was itching  yesterday.  The rest  of the review of systems is  negative.   PHYSICAL EXAM:  He is a well-nourished, well-developed male in no acute  distress.  Blood pressure is 121/61, pulse 68, respirations 20,  temperature 98.5, oxygen saturation 94% on room air.  Weight 88.9 kg.  HEENT:  Normal.  NECK:  Without JVD.  LYMPH:  Without lymphadenopathy.  ENDOCRINE:  Without  thyromegaly.  CARDIAC:  Normal S1, diminished S2.  Regular rate and rhythm.  A 2-3/6  systolic ejection murmur best heard at the right upper sternal border.  LUNGS:  Clear to auscultation bilaterally.  SKIN:  With a few excoriations noted to his bilateral hands.  ABDOMEN:  Soft, nontender, with normoactive bowel sounds, no  organomegaly.  EXTREMITIES:  Without clubbing, cyanosis or edema.  MUSCULOSKELETAL:  Without joint deformity.  NEUROLOGIC:  He is alert and oriented x3.  Cranial nerves II-XII are  grossly intact.  VASCULAR EXAM:  He has bilateral carotid bruits versus transmitted  murmur.   Telemetry demonstrates sinus rhythm with 3 beats of nonsustained  V-tach  and one 3.5 second pause.  EKG demonstrates normal sinus rhythm with  first degree AV block, interventricular conduction delay.  New prominent  T wave inversions inferiorly and anterolaterally.  Chest x-ray:  No  acute abnormalities.   LABS:  White count 10,900, hemoglobin 12.3, hematocrit 36.8, platelet  count 243,000.  Potassium 3.9, creatinine 0.94, glucose 134, albumin  3.5, troponin I less than 0.05, 0.01, CK-MB 2.3, 2.2.   ASSESSMENT:  1. Syncope, 3.5 second pause noted on the monitor since admission.  2. Abnormal electrocardiogram.  The patient notes exertional weakness      over the last several days.  Question if this is an anginal      equivalent.  Myocardial infarction has been ruled out thus far.  3. Nonobstructive coronary disease in 2001.  4. A 70% innominate artery stenosis by angiogram in 2007.  5. Systolic murmur.  6. Bilateral carotid bruits versus radiating murmur.  7. Hypothyroidism.  8. History of laryngeal carcinoma status post laryngectomy.   PLAN:  The patient was also interviewed and examined by Dr.  Dietrich Pates.  He presents with allergic reaction and then syncope and now with an  abnormal EKG.  He has also had a significant pause on the monitor.  This  is somewhat of an odd situation with his presentation and the current  finding.  On exam he has a murmur of aortic stenosis and possible  cerebrovascular disease.  We will obtain an echocardiogram and carotid  ultrasound studies today.  We plan to transfer him to [facility] Monday for cardiac catheterization and possible pacemaker  implantation.  The patient will need to remain on the monitor  continuously until that time.  Heparin has been started  but we will stop this since there is no definite indication for acute  coronary syndrome.  He will be placed on aspirin and Plavix.   Thank you very much for the consultation.  We will be glad to follow the  patient up at the end of this  admission.      Tereso Newcomer, PA-C      Gerrit Friends. Dietrich Pates, MD, [facility]  Electronically Signed    SW/MEDQ  D:  09/25/2008  T:  09/25/2008  Job:  [id number]   cc:   Kingsley Callander. Ouida Sills, MD  Fax: [phone]

## 2011-01-21 NOTE — Op Note (Signed)
NAME:  Andre Holder, Andre Holder NO.:  [id number]   MEDICAL RECORD NO.:  [id number]          PATIENT TYPE:  OUT   LOCATION:  RAD                           FACILITY:  [facility]   PHYSICIAN:  Cindee Salt, M.D.       DATE OF BIRTH:  1923/08/10   DATE OF PROCEDURE:  DATE OF DISCHARGE:  01/28/2008                               OPERATIVE REPORT   PREOPERATIVE DIAGNOSIS:  Carpal tunnel syndrome left hand.   POSTOPERATIVE DIAGNOSIS:  Carpal tunnel syndrome left hand.   OPERATION:  Decompression of left median nerve.   SURGEON:  Cindee Salt, MD   ASSISTANT:  Carolyne Fiscal, RN   ANESTHESIA:  Forearm based IV regional.   ANESTHESIOLOGIST:  Bedelia Person, MD   HISTORY:  The patient is an 77 year old male with a history of carpal  tunnel syndrome, EMG nerve conduction is positive.  This has not  responded to conservative treatment.  He has elected to undergo  decompression.  Pre, peri, and postoperative course have been discussed  along with risks and complications.  He is aware that there is no  guarantee with the surgery, possibility of infection, recurrence injury  to arteries, nerves, tendons complete relief of symptoms, and dystrophy.  In the preoperative area, the patient is seen.  The extremity marked by  both the patient and surgeon.  Antibiotic given.   PROCEDURE:  The patient is brought to the operating room where a forearm  based IV regional anesthetic was carried out without difficulty.  He was  prepped using DuraPrep, supine position, left arm free.  A time-out was  taken.  A longitudinal incision was made in the palm  and carried down  through subcutaneous tissue.  Bleeders were electrocauterized.  The  palmar fascia was split, superficial palmar arch identified, flexor  tendon of the ring and little finger identified.  The ulnar side of the  median nerve and carpal retinaculum was incised with sharp dissection.  A right angle and Sewall retractor were placed between skin and  forearm  fascia.  The fascia was released for approximately a centimeter and half  proximal to the wrist crease under direct vision.  Canal was explored.  Area of compression to the nerve was apparent.  No further lesions were  identified.  The wound was irrigated.  The skin  closed with interrupted 5-0 Vicryl Rapide sutures.  Sterile compressive  dressing and splint with the fingers free was applied.  The patient  tolerated the procedure well and was taken to the recovery room for  observation in satisfactory condition.  He will be discharged home to  return to the [facility]  [place] in 1 week on Vicodin.           ______________________________  Cindee Salt, M.D.     GK/MEDQ  D:  02/01/2008  T:  02/02/2008  Job:  [id number]   cc:   Kingsley Callander. Ouida Sills, MD

## 2011-01-21 NOTE — Op Note (Signed)
NAME:  Andre Holder, Andre Holder NO.:  [id number]   MEDICAL RECORD NO.:  [id number]           PATIENT TYPE:   LOCATION:                                 FACILITY:   PHYSICIAN:  Hillis Range, MD       DATE OF BIRTH:  [birth year]   DATE OF PROCEDURE:  09/27/2008  DATE OF DISCHARGE:                               OPERATIVE REPORT   EP PROCEDURE NOTE   SURGEON:  Hillis Range, MD   PREPROCEDURE DIAGNOSES:  1. Mobitz II atrioventricular block.  2. Syncope.   PROCEDURES:  1. Dual-chamber pacemaker implantation.  2. Left upper extremity venography.   DESCRIPTION OF THE PROCEDURE:  Informed written consent was obtained,  and the patient was brought to the electrophysiology lab in a fasting  state.  He was adequately sedated with intravenous Valium and fentanyl  as outlined in the nursing report.  The patient's left chest were  prepped and draped in the usual sterile fashion by the EP lab staff.  The skin overlying the left deltopectoral region was infiltrated using  local lidocaine for analgesia.  A 4-cm incision was then made over the  left deltopectoral region.  A left-sided subcutaneous pacemaker pocket  was fashioned using a combination of sharp and blunt dissection.  Electrocautery was used to assure hemostasis.  A venogram of the left  upper extremity revealed a patent left cephalic and axillary venous  system.  The left axillary vein was cannulated using a modified  percutaneous Seldinger technique.  Through the left axillary vein, a  Medtronic model [id number] (serial [id number]) right atrial lead and a  Medtronic model [id number] (serial [id number]) right ventricular lead were  advanced into the right atrial appendage and right ventricular apical  septal positions respectively.  The leads were then secured to the  pectoralis fascia using #2 silk suture over the suture sleeves.  Initial  lead measurements revealed an atrial lead P-wave of 2.1 mV with an  impedance of 784  ohms and a threshold of 1.1 volts at 0.5 msec.  The  right ventricular lead R-wave measured 12.4 mV with an impedance of 961  ohms and a threshold of 0.7 volts at 0.5 msec.  The pocket was then  irrigated with copious gentamicin solution.  The leads were then  connected to a Medtronic Adapta L model ADDR1 (serial [id number]) dual-  chamber pacemaker.  The pacemaker was returned to the subcutaneous  pacemaker pocket.  The pocket was then closed in 2 layers with 2-0  Vicryl suture for the subcutaneous and subcuticular layers.  Steri-  Strips and a sterile bandage were then applied.  There were no early  apparent complications.   CONCLUSIONS:  1. Successful dual-chamber pacemaker implantation as above.  2. No early apparent complications.      Hillis Range, MD  Electronically Signed     JA/MEDQ  D:  09/27/2008  T:  09/28/2008  Job:  [id number]   cc:   Gerrit Friends. Dietrich Pates, MD, [facility]  Verne Carrow, MD

## 2011-01-21 NOTE — H&P (Signed)
NAME:  Andre Holder, Andre Holder NO.:  [id number]   MEDICAL RECORD NO.:  [id number]          PATIENT TYPE:  INP   LOCATION:  A321                          FACILITY:  [facility]   PHYSICIAN:  Kingsley Callander. Ouida Sills, MD       DATE OF BIRTH:  Jul 25, 1923   DATE OF ADMISSION:  09/21/2008  DATE OF DISCHARGE:  LH                              HISTORY & PHYSICAL   CHIEF COMPLAINT:  Syncope.   HISTORY OF PRESENT ILLNESS:  This patient is an 77 year old white male  who presented to the emergency room last night with itching.  He was  treated and being prepared for discharge when he had a syncopal episode.  He was unable to remember the events from last night, but his wife  states that he passed out.  He denied having any chest pain and  experienced nausea but did not vomit.  There was no reported  diaphoresis.  Serial cardiac enzymes and EKGs were ordered and has had  significant EKG changes with evidence of inferolateral ischemia.  Troponins though have been normal thus far.  He had a cardiac cath in  2001, which revealed a 40% LAD stenosis.  He had normal LV function.  He  has aortic sclerosis.  He is pain free and neurologically stable at the  time of my exam.  He had an MRI of his brain in 2007, which revealed  nonspecific white matter changes.  He was evaluated from a  cardiovascular standpoint by Dr. Darrick Penna then an underwent an arch  aortogram and selective angiogram of the innominate artery, left common  carotid and left subclavian artery.  The innominate artery had a 70%  stenosis.  There was minimal carotid stenosis bilaterally.   PAST MEDICAL HISTORY:  1. Laryngeal cancer, status post laryngectomy and radiation therapy in      1988.  2. Hypothyroidism.  TSH was 3.09 on June 21, 2008.  3. Osteoarthritis.  4. Carpal tunnel syndrome.  5. Appendectomy in 1973.  6. Arthroscopic left knee surgery.  7. Bilateral cataract surgery.  8. Pneumonia.   MEDICATIONS:  1. Synthroid 150 mcg  daily.  2. Vitamin daily.   ALLERGIES:  NONE.   FAMILY HISTORY:  Father had leukemia and died at 78.  His mother had a  stroke at a 36.  A sister has had diabetes.   SOCIAL HISTORY:  He has a 25 pack-year smoking history.  He does not  abuse alcohol or use drugs.   REVIEW OF SYSTEMS:  Noncontributory.   PHYSICAL EXAMINATION:  GENERAL:  Alert and comfortable appearing.  HEENT:  No scleral icterus.  Pharynx is unremarkable.  NECK:  Reveals a stable tracheostomy.  No adenopathy.  LUNGS:  Clear.  HEART:  Regular with a grade 2 systolic murmur.  ABDOMEN:  Nontender.  No hepatosplenomegaly.  EXTREMITIES:  No clubbing or edema.  NEURO:  No focal weakness.  Coordination intact.  SKIN:  Warm and dry.  LYMPH NODES:  No enlargement.   IMPRESSION:  1. Syncope.  As stated, cardiac enzymes are negative, but he has  ischemic changes on his EKG.  He will be treated with heparin and      aspirin.  A cardiology consultation will be obtained.  He has a      history of nonobstructive coronary artery disease by      catheterization in 2001.  2. Itching, resolved.  3. Hypothyroidism.  Continue Synthroid.  4. History of laryngeal cancer, stable.  He has had no sign of      recurrence.      Kingsley Callander. Ouida Sills, MD  Electronically Signed     ROF/MEDQ  D:  09/22/2008  T:  09/22/2008  Job:  [id number]

## 2011-01-21 NOTE — Discharge Summary (Signed)
NAME:  Andre Holder, Andre Holder NO.:  [id number]   MEDICAL RECORD NO.:  [id number]          PATIENT TYPE:  INP   LOCATION:  A321                          FACILITY:  [facility]   PHYSICIAN:  Kingsley Callander. Ouida Sills, MD       DATE OF BIRTH:  20-Jan-1923   DATE OF ADMISSION:  09/21/2008  DATE OF DISCHARGE:  01/18/2010LH                               DISCHARGE SUMMARY   DISCHARGE DIAGNOSES:  1. Syncope.  2. Hypothyroidism.  3. Mild aortic stenosis.  4. Prolonged pulse.  5. History of laryngeal cancer, status post laryngectomy   HOSPITAL COURSE:  This patient is an 77 year old male who presented to  the emergency room with itching.  Upon preparation for discharge,  though, he had a syncopal episode.  He was hospitalized for observation.  He had a noted 3-4 second pulse.  He has had EKG changes suggestive of  inferolateral ischemia but cardiac enzymes were negative.  He has been  monitored over the weekend and has had no further syncopal episodes.  The plan is for further evaluation at [facility] today with possible cardiac  catheterization and possible pacemaker placement.  He has been seen in  consultation by Dr. Dietrich Pates.  He had an echocardiogram which revealed  normal LV function with no left ventricular wall motion abnormalities.  He had mild aortic stenosis.  Plavix was added.  He was euthyroid with a  TSH of 2.28.   DISCHARGE MEDICATIONS:  1. Synthroid 150 mcg daily.  2. Aspirin 81 mg daily.  3. Plavix 75 mg daily.  4. Lovenox 40 mg subcu q.24 hours.      Kingsley Callander. Ouida Sills, MD  Electronically Signed     ROF/MEDQ  D:  09/25/2008  T:  09/25/2008  Job:  [id number]

## 2011-01-21 NOTE — Consult Note (Signed)
NAMEMarland Holder  JUSTYN, LANGHAM NO.:  [id number]   MEDICAL RECORD NO.:  [id number]          PATIENT TYPE:  INP   LOCATION:  2039                         FACILITY:  [facility]   PHYSICIAN:  Hillis Range, MD       DATE OF BIRTH:  Jul 22, 1923   DATE OF CONSULTATION:  DATE OF DISCHARGE:                                 CONSULTATION   REQUESTING PHYSICIAN:  Kingsley Callander. Ouida Sills, MD   REASON FOR CONSULTATION:  Symptomatic bradycardia.   HISTORY OF PRESENT ILLNESS:  Mr. Mcclish is a pleasant 77 year old  gentleman with a history of nonobstructive coronary artery disease,  peripheral vascular disease, hypothyroidism, and status post  laryngectomy who was admitted following a syncopal episode which  occurred on September 21, 2008.  The patient reports that over the past 2-  3 weeks, he has had progressive symptoms of shortness of breath and  fatigue.  He has also had dizziness at home with several presyncopal  episodes.  He reports that occasionally he feels as if he might pass out  but is able to catch himself before he falls.  He presented to [facility] on September 21, 2008, with a pruritic rash.  Prior to  discharge, he had an acute loss of consciousness.  Unfortunately, he was  not on monitor at the time.  He had spontaneous return to his baseline  state of health.  He was transferred to [facility] for further  evaluation.  Since he has been hospitalized, he is found to have sinus  bradycardia with heart rates in the 30s and 40s as well as 3 and 4-  second pauses.  He underwent left heart catheterization today and while  being evaluated by Dr. Clifton James, he was found to have a 2.6-second pause  with symptoms of presyncope.  Upon further telemetry review, he also has  had episodes of Mobitz II second-degree AV block.  The patient denies  chest pain.  He is status post left heart catheterization today which  revealed nonobstructive coronary artery disease.  He is otherwise  without complaint at this time.  He denies any recent infections or  procedures other than his left heart catheterization.  He is unaware of  any significant medical changes recently.   PAST MEDICAL HISTORY:  1. Nonobstructive coronary artery disease.  2. Mild aortic stenosis.  3. Peripheral vascular disease with a 70% innominate artery stenosis      and nonobstructive carotid stenosis.  4. Hypothyroidism.  5. Degenerative joint disease status post bilateral total knee      replacements.  6. Status post prior exploratory abdominal surgery.  7. Status post prostatic surgery.  8. History of laryngeal cancer status post laryngectomy.  9. Status post carpal tunnel surgery.   ALLERGIES:  No known drug allergies.   CURRENT MEDICATIONS:  1. Aspirin 81 mg daily.  2. Plavix 75 mg daily.  3. Levothyroxine 150 mcg daily.  4. Lovenox 40 mg at bedtime.   SOCIAL HISTORY:  The patient lives in [place] with his spouse.  He  has a remote history of tobacco  but quit 30 years ago.  He denies  alcohol or drug use.   FAMILY HISTORY:  The patient's mother died of a stroke in her 30s.   REVIEW OF SYSTEMS:  All systems are reviewed and negative except as  outlined in the HPI above.   PHYSICAL EXAMINATION:  VITALS:  Blood pressure 117/62, heart rate 54,  respirations 18, sats 93% on room air, afebrile.  GENERAL:  The patient is a thin, elderly gentleman in no acute distress.  He is alert and oriented x3.  HEENT:  Normocephalic, atraumatic.  Sclerae clear.  Conjunctivae pink.  Oropharynx clear.  The patient is status post laryngectomy and talks  with the assistance of a Laravox.  No thyromegaly.  Bilateral bruits are  noted.  JVP 8 cm.  LUNGS:  Clear to auscultation bilaterally.  HEART:  Regular rate and rhythm, 2/6 systolic ejection murmur along the  left sternal border.  GI:  Soft, nontender, nondistended.  Positive bowel sounds.  EXTREMITIES:  No clubbing, cyanosis, or edema.  NEUROLOGIC:   Strength and sensation are intact.  SKIN:  No ecchymosis or lacerations.  MUSCULOSKELETAL:  No deformity or atrophy.  PSYCH:  Euthymic mood.  Full affect.   Telemetry reveals Mobitz II AV block as well as sinus bradycardia with  heart rates in the 30s and 40s.   An EKG reveals sinus rhythm at 60 beats per minute with a first-degree  AV block and a left bundle-branch block QRS pattern.   Transthoracic echocardiogram September 22, 2008, reveals left ventricular  end-diastolic dimension 35.6 with an ejection fraction that is normal.  There is no significant wall motion abnormality.  Mild-to-moderate  calcific aortic valve disease as noted with mild aortic stenosis.  The  right ventricular function is normal.   LABORATORY DATA:  Creatinine 0.88.  TSH 2.281.  Hematocrit 36.8,  platelets 243.   IMPRESSION:  Mr. Volante is a pleasant 77 year old gentleman who presents  for further evaluation after a syncopal episode.  He is documented to  have Mobitz II second-degree AV block as well as sinus node dysfunction  with heart rates in the 30s and 40s.  I suspect that his multiple  presyncopal episodes are secondary to Mobitz II AV block with prolonged  RR intervals.  There has been no evidence of significant ventricular  arrhythmias and his ejection fraction appears to be preserved.  Given  the patient's symptomatic bradycardia and prolonged RR intervals with  Mobitz II AV block, I think that he would benefit from dual-chamber  pacemaker implantation.  Risks, benefits and alternatives to dual-  chamber pacemaker implantation were discussed at length with the patient  and his spouse.  These risks  include but are not limited to infection, bleeding, vascular damage,  pericardial effusion with perforation, lead dislodgement, renal failure,  and pneumothorax.  The patient understands these risks and wishes to  proceed.  We will, therefore, schedule dual-chamber pacemaker  implantation at the next  available time.      Hillis Range, MD  Electronically Signed     JA/MEDQ  D:  09/26/2008  T:  09/27/2008  Job:  [id number]   cc:   Kingsley Callander. Ouida Sills, MD

## 2011-01-21 NOTE — Letter (Signed)
April 06, 2009    Kingsley Callander. Ouida Sills, MD  [address]  [place], [place] [address]   RE:  BODIN, GORKA  MRN:  [id number]  /  DOB:  September 24, 1922   Dear Channing Mutters:   Mr. Cartlidge returns to the office for continued assessment and treatment  of mild coronary artery disease, aortic valve disease, conduction system  disease, and cardiovascular risk factors.  Since his last visit, he has  done quite well.  He has occasional dizziness that is poorly  characterized, not associated with a sense of presyncope and that  resolved spontaneously over the course of the morning.  This is not  clearly orthostatic by his description 2 day.  He has had no dyspnea.  He does have mild left-sided chest discomfort that is occasional,  unrelated to exercise, that also resolved spontaneously.  He said to  have peripheral vascular disease, but has excellent distal pedal pulses.  His blood pressure disparity between his right and left arm does suggest  disease in the vasculature to the right upper extremity.   Current pharmaceutical preparations include:  1. Levothyroxine 0.15 mg daily.  2. Simvastatin 40 mg daily.  3. Aspirin 81 mg daily.   He takes a sizable number of neutrophils Tanda Rockers, most recently adding  male enhancement products.  Review of the ingredients of that  preparation revealed none that should lead to problems.   On exam, very pleasant gentleman using an artificial larynx in no acute  distress.  The weight is 194, 3 pounds less than at his last visit.  Blood pressure 115/70 in the right arm and 135/70 in the left arm  sitting, heart rate 70 and regular, respirations 12 and unlabored.  NECK:  No jugular venous distention.  LUNGS:  Clear.  CARDIAC:  Normal first heart sounds; preserved aortic component second  heart sounds; grade 2/6 early peaking systolic ejection murmur.  ABDOMEN:  Soft and nontender; no bruits.  EXTREMITIES:  Normal distal pulses; no edema.   Recent laboratory is excellent  with normal iron studies, a borderline  anemia with hemoglobin of 12 and hematocrit of 36 and a normal MCV and  an excellent lipid profile with total cholesterol of 136, triglycerides  of 66, HDL 50, and LDL 73.   IMPRESSION:  Mr. Westrup management of cardiovascular risk factors is  optimal.  His current medication regime is appropriate.  We will arrange  for an initial assessment of his pacemaker and adjustment of thresholds  and stimulation parameters.  He was offered sublingual nitroglycerin to  try for his chest discomfort, but he does not think that any  pharmacologic treatment is warranted.  I cautioned him to call for  increased chest discomfort or new symptoms.  Otherwise, I will plan to  see this nice gentleman again in 1 year.    Sincerely,      Gerrit Friends. Dietrich Pates, MD, [facility]  Electronically Signed    RMR/MedQ  DD: 04/06/2009  DT: 04/07/2009  Job #: [id number]

## 2011-01-21 NOTE — Discharge Summary (Signed)
NAMEMarland Kitchen  Andre Holder, Andre Holder NO.:  [id number]   MEDICAL RECORD NO.:  [id number]          PATIENT TYPE:  INP   LOCATION:  2039                         FACILITY:  [facility]   PHYSICIAN:  Andre Holder       DATE OF BIRTH:  09/13/1922   DATE OF ADMISSION:  09/25/2008  DATE OF DISCHARGE:  09/28/2008                               DISCHARGE SUMMARY   PRIMARY CARDIOLOGIST:  Andre Holder, [facility]   ELECTROPHYSIOLOGIST:  Andre Holder   PRIMARY CARE PHYSICIAN:  Andre Holder.   DISCHARGE DIAGNOSIS:  Syncope.   SECONDARY DIAGNOSES:  1. Second-degree type 2 heart block as well as symptomatic sinus node      dysfunction.  2. History of nonobstructive coronary artery disease.  3. Peripheral arterial disease.  4. Hypothyroidism.  5. Degenerative joint disease.  6. Status post appendectomy.  7. Status post prostate surgery.  8. History of laryngeal cancer status post laryngectomy.  9. Status post carpal tunnel surgery, bilateral hands.  10.Remote tobacco abuse.   ALLERGIES:  No known drug allergies.   PROCEDURES:  Left heart cardiac catheterization, September 26, 2008  revealing nonobstructive coronary artery disease, ejection fraction 45%,  and mild global left ventricular dysfunction.   HISTORY OF PRESENT ILLNESS:  An 77 year old Caucasian male who presented  to the [facility] Emergency Department on September 20, 2008 with  complaints of itching.  He was apparently being readied for discharge  when he had sudden syncope with brief loss of consciousness.  He was  subsequently admitted and ECG showed new changes with inferolateral  ischemia.  Cardiac markers were negative.  The patient was also noted on  telemetry to exhibit second-degree type 2 heart block as well as periods  of pauses up to 3 seconds which were symptomatic.  The patient was  evaluated by Andre Holder at [facility] and decision was made to  transfer to [facility] for further  evaluation.   HOSPITAL COURSE:  Following arrival at [facility], the patient underwent  left heart cardiac catheterization on September 26, 2008 revealing  nonobstructive coronary artery disease and mild global LV dysfunction  with EF of 45-50%.  He was evaluated by Andre Holder of  Electrophysiology and decision was made to pursue permanent pacemaker  placement.  The patient underwent successful placement of a Medtronic  Adapta dual-chamber permanent pacemaker, serial [id number].  He  tolerated this procedure well and post-procedure chest x-ray shows no  pneumothorax.  We have arranged for a followup in [facility] as well  as with Andre Holder and Andre Holder will be discharged home today in good  condition.   DISCHARGE LABORATORY DATA:  Hemoglobin 11.1, hematocrit 33.1, WBC 7.5,  and platelets 238.  INR 1.0.  Sodium 137, potassium 3.7, chloride 104,  CO2 25, BUN 10, creatinine 0.81, and glucose 126.  Total bilirubin 0.7,  alkaline phosphatase 70, AST 23, ALT 16, total protein 5.9, albumin 3.5,  and calcium 8.4.  CK 60, MB 1.9, and troponin I 0.01.  TSH 2.281.   DISPOSITION:  The  patient will be discharged home today in good  condition.   FOLLOWUP PLANS AND APPOINTMENTS:  The patient will follow up in [facility] in [place] on October 12, 2008.  He will follow up with  Andre Holder on December 29, 2008 at 10:20 a.m.   DISCHARGE MEDICATIONS:  1. Synthroid 150 mcg daily.  2. Aspirin 81 mg daily.  3. Simvastatin 40 mg at bedtime (initiated secondary to a history of      coronary artery disease).   OUTSTANDING LABORATORY STUDIES:  None.   DURATION OF DISCHARGE ENCOUNTER:  45 minutes including physician time.      Andre Holder, ANP      Andre Holder  Electronically Signed    CB/MEDQ  D:  09/28/2008  T:  09/28/2008  Job:  [id number]   cc:   Andre Holder

## 2011-01-24 NOTE — Cardiovascular Report (Signed)
[place]. [facility]  Patient:    [name], [name]                      MRN: [id number] Proc. Date: 06/11/00 Adm. Date:  [id number] Disc. Date: [id number] Attending:  Nelta Numbers CC:         Carylon Perches, M.D.  Gerrit Friends. Dietrich Pates, M.D. [facility]  Cardiopulmonary Laboratory   Cardiac Catheterization  CLINICAL HISTORY:  Andre Holder is 78 years old and has had several episodes of chest tightness and underwent stress testing by Dr. Ouida Sills which showed ST changes and an inferior defect which was persistent.  He was seen in consultation by Dr. Dietrich Pates and arrangements were made for cardiac catheterization.  DESCRIPTION OF PROCEDURE:  The procedure was performed via the right femoral artery using an arterial sheath and 6 French preformed coronary catheters.  A front wall arterial puncture was performed and Omnipaque contrast was used. The patient tolerated the procedure well and left the laboratory in satisfactory condition.  RESULTS:  The aortic pressure was 158/67 with a mean of 97.  Left ventricular pressure was 158/24.  The left main coronary artery:  The left main coronary artery was free of significant disease.  Left anterior descending:  The left anterior descending artery gave rise to two diagonal branches and three septal perforators.  There was 40% narrowing in the proximal LAD.  The rest of the vessel was free of significant disease.  Circumflex artery:  The circumflex artery gave rise to two small marginal branches, an atrial branch, and three posterolateral branches.  These vessels were free of significant disease.  Right coronary artery:  The right coronary is a moderate sized vessel that gave rise to a conus branch, right ventricular branch, a posterior descending branch and three posterolateral branches.  These vessels were free of significant disease.  LEFT VENTRICULOGRAPHY:  The left ventriculogram was performed in the  RAO projection showed good wall motion with no area of hypokinesis.  The estimated ejection fraction is 50%.  CONCLUSIONS:  Nonobstructive coronary artery disease with good left ventricular function.  RECOMMENDATIONS:  There is no source of ischemia.  Based on the results of the angiogram, I think the patients recent symptoms are probably not ischemic. We will plan reassurance.  I would recommend daily aspirin prophylaxis since he does have some plaque in his coronary arteries.  We will arrange followup with Dr. Ouida Sills in a week. DD:  06/11/00 TD:  06/12/00 Job: [id number]

## 2011-01-24 NOTE — H&P (Signed)
NAME:  Andre Holder, Andre Holder                         ACCOUNT NO.:  [id number]   MEDICAL RECORD NO.:  [id number]                   PATIENT TYPE:  EMS   LOCATION:  ED                                   FACILITY:  [facility]   PHYSICIAN:  Gracelyn Nurse, M.D.              DATE OF BIRTH:  29-Jan-1923   DATE OF ADMISSION:  07/24/2002  DATE OF DISCHARGE:                                HISTORY & PHYSICAL   CHIEF COMPLAINT:  Generalized malaise.   HISTORY OF PRESENT ILLNESS:  This is a 77 year old white male who presents  with a one-week history of generalized malaise.  He has also had a  productive cough.  Today he developed fever and chills and felt much worse  so he decided to come into the ED.   PAST MEDICAL HISTORY:  1. History of laryngeal cancer.  2. Status post laryngectomy with tracheostomy.  3. Hypothyroidism.  4. Coronary artery disease.   ALLERGIES:  No known drug allergies.   CURRENT MEDICATIONS:  1. Synthroid 0.175 mg q.d.  2. Aspirin 325 mg q.d.   SOCIAL HISTORY:  Stop smoking 10-15 years ago.  Drinks alcohol occasionally.  Is married, with three children.   FAMILY HISTORY:  Father died of leukemia.  Mother died of a stroke.   REVIEW OF SYSTEMS:  As per HPI.  All other systems are reviewed and are  normal.   PHYSICAL EXAMINATION:  VITAL SIGNS:  Temperature 103.5, pulse 97,  respirations 24, blood pressure 122/47.  GENERAL:  Well-nourished white male in no acute distress.  HEENT:  Pupils are equal, round, and reactive to light.  Extraocular  movements intact.  Oral mucosa is moist.  He has a stoma that has yellow-  green sputum at the orifice.  CARDIOVASCULAR:  Regular rate and rhythm.  No murmurs.  LUNGS:  Decreased breath sounds in the left base.  ABDOMEN:  Soft, nontender, nondistended.  Bowel sounds are positive.  EXTREMITIES:  No edema.  NEUROLOGIC:  Cranial nerves II-XII grossly intact.  No focal deficits.  SKIN:  Moist.  No rash.   LABORATORY DATA:  Chest x-ray  reveals left lower lobe infiltrate with a left  pleural effusion.   Hemoglobin 12.5, white blood cell count 7.1, platelets 252.  Sodium 136,  potassium 3.6, chloride 105, CO2 29, BUN 21, creatinine 1.2, glucose 122.   ASSESSMENT AND PLAN:  1. Pneumonia, likely community-acquired:  Will start IV antibiotics and O2     support.  He does have the pleural effusion which is probably a     parapneumonic effusion, and I do not feel like it needs to be tapped at     this time.  2. Hypothyroidism:  Will continue his home dose of Synthroid.  Gracelyn Nurse, M.D.    JDJ/MEDQ  D:  07/24/2002  T:  07/25/2002  Job:  [id number]

## 2011-01-24 NOTE — Discharge Summary (Signed)
[place]. [facility]  Patient:    Andre Holder, Andre Holder                      MRN: [id number] Adm. Date:  [id number] Disc. Date: [id number] Attending:  Nelta Numbers Dictator:   Abelino Derrick, P.A.C. [facility] CC:         Carylon Perches, M.D., [address] [place], [place] [address]  Everardo Beals. Juanda Chance, M.D. [facility]   Discharge Summary  DISCHARGE DIAGNOSES:  1. Chest pain, nonobstructive coronary disease by catheterization this     admission.  2. History of laryngeal carcinoma.  3. Treated hypothyroidism.  HOSPITAL COURSE: The patient is a 77 year old male admitted to [facility] with chest pain.  He was seen there by Dr. Dietrich Pates.  A Cardiolite study was obtained and was abnormal, with a small perfusion defect in the inferior apical region, with an EF of 50%.  He was transferred to [facility] for catheterization.  This was done by Dr. Juanda Chance on June 11, 2000 and revealed a 40% LAD narrowing, otherwise no obstruction, with an EF of 50%. The plan is for medical therapy.  We have added in aspirin.  He is discharged the morning of June 12, 2000 and will follow up with Dr. Ouida Sills in about a week.  DISCHARGE MEDICATIONS:  1. Synthroid 0.175 mg q.d.  2. Aspirin q.d.  LABORATORY DATA: Lipid profile shows an LDL of 119, HDL 42, triglyceride 204. INR 1.0.  Laboratories from [facility] included EKG showing sinus rhythm with nonspecific ST-T changes.  Chest x-ray showed no acute abnormalities.  CK-MB and troponins were negative.  Chemistries showed a BUN of 19, creatinine 1.2.  Sodium was 136, potassium 4.3.  WBC 6.9, hemoglobin 13.5, hematocrit 39.8, platelets 272,000.  DISCHARGE CONDITION: Stable.  FOLLOW-UP: The patient will follow up with Dr. Ouida Sills in a week. DD:  06/12/00 TD:  06/12/00 Job: [id number]

## 2011-01-24 NOTE — Consult Note (Signed)
NAMEMarland Holder  GAETAN, SPIEKER NO.:  [id number]   MEDICAL RECORD NO.:  [id number]          PATIENT TYPE:  AMB   LOCATION:  SDS                          FACILITY:  [facility]   PHYSICIAN:  Marin Roberts, MDDATE OF BIRTH:  [birth year]   DATE OF CONSULTATION:  DATE OF DISCHARGE:  06/30/2006                                   CONSULTATION   CONSULTATION NOTE:  Dear Dr. Darrick Penna:   Thank you for the opportunity to review the intracranial images from Mr.  [name] recently cerebral angiogram.  Intracranial interpretation is  as follows:   Right common carotid injection.  From the images of the neck, this injection  is performed from a tenuous position within the brachiocephalic artery.  Injection demonstrates mild narrowing of the supraclinoid right internal  carotid artery.  There is persistent filling of the posterior communicating  artery compatible with a fetal type posterior cerebral artery.  There is  faint bidirectional flow within the right A1 segment.  No focal stenoses,  aneurysms, or branch vessel occlusions are identified.   Left common carotid injection:  There is intermittent flash filling,  bidirectional flow across the anterior communicating artery to fill the  distal right A2 segments and flash reverse filling of the right A1 segment.  Minimal irregularity is noted in the supraclinoid left ICA.  No focal  aneurysms, stenoses, or branch vessel occlusion is identified on these  images.   IMPRESSION:  1. Mildly irregular supraclinoid ICA bilaterally, compatible with      intracranial atherosclerosis.  2. Bidirectional flow in the right A1 segment with patent anterior      communicating artery.  This suggests significant proximal stenosis on      the right.   Thank you again for sending these images for review.  If I can be of any  further assistance, please do not hesitate to call.           ______________________________  Marin Roberts,  MD     CM/MEDQ  D:  07/03/2006  T:  07/04/2006  Job:  [id number]

## 2011-01-24 NOTE — Discharge Summary (Signed)
   NAME:  Andre Holder, NOFFKE                         ACCOUNT NO.:  [id number]   MEDICAL RECORD NO.:  [id number]                   PATIENT TYPE:  INP   LOCATION:  A323                                 FACILITY:  [facility]   PHYSICIAN:  Kingsley Callander. Ouida Sills, M.D.                  DATE OF BIRTH:  05/16/1923   DATE OF ADMISSION:  07/24/2002  DATE OF DISCHARGE:  07/29/2002                                 DISCHARGE SUMMARY   DISCHARGE DIAGNOSES:  1. Community-acquired pneumonia.  2. Status post laryngectomy for laryngeal cancer.  3. Hypothyroidism.   HOSPITAL COURSE:  This patient is a 77 year old white male who presented  with weakness, fever, and cough.  He had a left lower lobe infiltrate  consistent with pneumonia.  His white count was 7.1.  Blood cultures were  negative.  He was treated with IV Levaquin.  His oxygen saturation was 91%  on room air.  He coughed up blood through his tracheostomy.  A repeat chest  x-ray though revealed clearance of his infiltrate.  This fever resolved  after initially being as high as 103.   His general weakness improved moderately well and he was felt to be stable  for discharge on November 21.  He will have followup in the office in two  weeks.  If his hemoptysis does not clear, he will be referred for  bronchoscopy.   DISCHARGE MEDICATIONS:  1. Levaquin 500 mg q.d. for seven more days.  2. Hycodan 1 tsp q.4h. p.r.n.  3. Synthroid 175 mcg q.d.  4. He will hold aspirin until his hemoptysis clears.                                               Kingsley Callander. Ouida Sills, M.D.    ROF/MEDQ  D:  07/29/2002  T:  07/29/2002  Job:  [id number]

## 2011-01-24 NOTE — Op Note (Signed)
NAME:  TRAYSHAWN, DURKIN NO.:  [id number]   MEDICAL RECORD NO.:  [id number]          PATIENT TYPE:  AMB   LOCATION:  DSC                          FACILITY:  [facility]   PHYSICIAN:  Cindee Salt, M.D.       DATE OF BIRTH:  09-02-1923   DATE OF PROCEDURE:  01/08/2005  DATE OF DISCHARGE:                                 OPERATIVE REPORT   PREOPERATIVE DIAGNOSIS:  Carpal tunnel syndrome, right hand.   POSTOPERATIVE DIAGNOSIS:  Carpal tunnel syndrome, right hand.   OPERATION:  Decompression right median nerve.   SURGEON:  Cindee Salt, M.D.   ANESTHESIA:  Forearm based IV regional with supplementation.   HISTORY:  The patient is a 77 year old male with a history of carpal tunnel  syndrome, EMG nerve conductions positive. This has not responded to  conservative treatment.   PROCEDURE:  The patient is brought to the operating room where a forearm  based IV regional anesthetic was carried out without difficulty.  He was  prepped using DuraPrep, supine position, right arm free. A longitudinal  incision was made in the palm and he had some feeling.  This was then  infiltrated with 1% Xylocaine without epinephrine. The palmar fascia was  split, superficial palmar arch identified.  The flexor tendon to the ring  and little finger identified to the ulnar side of the median nerve.  Carpal  retinaculum was incised with sharp dissection.  A right angle and Sewell  retractor placed through skin and forearm fascia.  Fascia was released for  approximately a centimeter and a half proximal to the wrist crease under  direct vision.  Tenosynovial tissue was moderately thickened. No further  lesions were identified. The area of compression of the nerve was apparent.  The wound was irrigated. Skin was closed interrupted 5-0 nylon sutures. A  sterile compressive dressing and splint to the hand was applied. The patient  tolerated the procedure well was taken to the recovery observation in  satisfactory condition. He is discharged home to return to the [facility]  of [place] in one week on Vicodin.      GK/MEDQ  D:  01/08/2005  T:  01/08/2005  Job:  [id number]

## 2011-01-24 NOTE — Op Note (Signed)
NAME:  Andre Holder, Andre Holder               ACCOUNT NO.:  [id number]   MEDICAL RECORD NO.:  [id number]          PATIENT TYPE:  AMB   LOCATION:  SDS                          FACILITY:  [facility]   PHYSICIAN:  Janetta Hora. Fields, MD  DATE OF BIRTH:  10-Sep-1922   DATE OF PROCEDURE:  06/30/2006  DATE OF DISCHARGE:  06/30/2006                                 OPERATIVE REPORT   PROCEDURE:  1. Arch aortogram.  2. Selective angiogram of the innominate artery, left common carotid      artery and left subclavian artery.   PREOPERATIVE DIAGNOSIS:  Vertigo.   POSTOPERATIVE DIAGNOSIS:  Vertigo.   ANESTHESIA:  Local.   OPERATIVE DETAIL:  After obtaining informed consent, the patient taken the  PV lab.  The patient placed supine position on the angio table.  Next, both  groins were prepped and draped usual sterile fashion.  Local anesthesia was  infiltrated over the left common femoral artery.  A Majestic needle was used  to cannulate the left common femoral artery and a 0.035 Wholey wire advanced  into the right common femoral artery under fluoroscopic guidance.  A 5-  French sheath was then placed over the guidewire in the right common femoral  artery.  5 French pigtail catheter was then placed over the guidewire and  these were advanced as a unit into the aortic arch.  Arch aortogram was then  obtained in two different planes.  This shows a stenosis of the innominate  artery of approximately 70% which is heavily calcified.  The arch anatomy is  normal configuration.  There is no stenosis of the left common carotid or  left subclavian artery origin.  At this point the pigtail catheter was  exchanged over a guidewire for an H1 catheter.  The H1 catheter was used to  selectively catheterize the innominate artery.  Contrast injection was  performed of the innominate artery which again confirms the 70% stenosis  with heavy calcification of the origin of the innominate artery.  The right  subclavian artery  distally is widely patent.  There is retrograde filling of  the right vertebral artery but this was a very large artery and its origin  is widely patent.  The right common carotid artery is widely patent.  Films  were then also obtained of the carotid bifurcation on the right side in an  AP and lateral projection.  There is minimal atherosclerotic change and  minimal stenosis of the carotid bifurcation.  Intracranial views in an AP  and lateral projection were also obtained and these will be interpreted at a  later date by the neuroradiologist.   Next the H1 catheter was pulled back into the aortic arch.  The patient was  also given 3000 units of heparin during this portion of the case to keep the  blood thin during the catheterization of the other two great vessels.  Several attempts were made to catheterize the left common carotid artery.  These were unsuccessful with the H1 catheter.  I was able to cannulate the  left subclavian artery with the H1 catheter and  a selective injection of  this was performed.  This shows a widely patent left vertebral artery which  is again very large in caliber.  The left subclavian vein is widely patent.  The left subclavian artery is widely patent.   Next H1 catheter was removed over a guide wire and exchanged for a  Berenstein catheter.  This was then advanced over the guidewire and the left  common carotid artery was easily selected.  Selective injection of this was  obtained which again shows minimal atherosclerotic change of the left common  carotid artery.  It is very tortuous in its proximal aspect.  Carotid  bifurcation views were also obtained in AP and lateral projection.  There  again was minimal atherosclerotic change, approximately 30% stenosis of the  left carotid bifurcation.  Intracranial views were also obtained in AP and  lateral projection and these be interpreted by the neuroradiologist.  Next  the catheter was pulled back in the  aortic arch over a guidewire.  The  guidewire and catheter then were removed as a unit.  The 5-French sheath was  left in place until the ACT was down to a reasonable level.  The patient  tolerated procedure well and there were no complications.  The patient was  taken to the holding area in stable condition.   OPERATIVE FINDINGS:  1. Innominate artery stenosis approximately 70%.  2. Retrograde filling of right vertebral artery.  3. Large left vertebral artery with antegrade flow.  4. Minimal carotid stenosis bilaterally.      Janetta Hora. Fields, MD  Electronically Signed     CEF/MEDQ  D:  07/01/2006  T:  07/02/2006  Job:  [id number]

## 2011-01-24 NOTE — Cardiovascular Report (Signed)
[place]. [facility]  Patient:    [name], [name]                      MRN: [id number] Proc. Date: 06/11/00 Adm. Date:  [id number] Attending:  Nelta Numbers CC:         Carylon Perches, M.D.  Gerrit Friends. Dietrich Pates, M.D. [facility]  Cardiopulmonary Laboratory   Cardiac Catheterization  PROCEDURES PERFORMED:  Cardiac catheterization.  CLINICAL HISTORY:  Mr. Lancon is 77 years old and had a recent onset of several episodes of chest tightness radiating to the left shoulder.  He had a stress test by Dr. Ouida Sills which showed ST changes and the Cardiolite images showed a fixed inferior wall defect.  For these reasons he was scheduled for cardiac catheterization.  DESCRIPTION OF PROCEDURE:  The procedure was performed via the right femoral artery using an arterial sheath and 6 French preformed coronary catheters.  A front wall arterial puncture was performed and Omnipaque contrast was used. The patient tolerated the procedure well and left the laboratory in satisfactory condition. DD:  06/11/00 TD:  06/12/00 Job: [id number]

## 2011-01-24 NOTE — Op Note (Signed)
NAME:  Andre Holder, Andre Holder               ACCOUNT NO.:  [id number]   MEDICAL RECORD NO.:  [id number]          PATIENT TYPE:  AMB   LOCATION:  DAY                           FACILITY:  [facility]   PHYSICIAN:  R. Roetta Sessions, M.D. DATE OF BIRTH:  [birth year]   DATE OF PROCEDURE:  01/29/2005  DATE OF DISCHARGE:                                 OPERATIVE REPORT   PROCEDURE PERFORMED:  Esophagogastroduodenoscopy with esophageal brushing  and gastric biopsy.   INDICATIONS FOR PROCEDURE:  The patient is a 77 year old gentleman, status  post laryngectomy for carcinoma with a recent history of a 10 pound weight  loss, some odynophagia, dysphagia and lower retrosternal, upper epigastric  pain, postprandial recently.   Esophagogastroduodenoscopy is now being done to further evaluate his  symptoms.  This approach has been discussed with the patient at length,  potential risks, benefits and alternatives have been reviewed and questions  answered.  The patient is agreeable.  Please see documentation in the  medical record.   PROCEDURE NOTE:  Oxygen saturations, blood pressure, pulse and respirations  were monitored throughout the entirety of the procedure.   CONSCIOUS SEDATION:  Versed 2 mg IV, Demerol 50 mg IV in divided doses.  Cetacaine spray for topical oropharyngeal anesthesia.   INSTRUMENT USED:  Olympus video chip system.   FINDINGS:  Examination of the hypopharynx revealed a surgically altered  anatomy with no vocal cords apparent.  Esophageal inlet was easily  intubated.  Examination of tubular esophagus revealed cream-colored raised  plaques in a linear distribution throughout the mid and distal esophagus,  consistent with candida esophagus.  Underlying mucosa was somewhat friable.  The tubular esophagus was widely patent through the esophagogastric  junction.   Stomach:  The gastric cavity had quite a bit of bile stained mucus within  it.  It insufflated fairly well with air.  However,  there was a waisting  effect midstomach and some respiratory excursions were noted to  extrinsically compress the stomach at this level, consistent with a very  large hiatal hernia.  The gastric mucosa particularly in the antrum appeared  to be mottled in appearance with a particularly glandular appearance.  A  retroflex view of the proximal stomach demonstrated no additional  abnormalities.  The pylorus was patent and easily traversed.  Examination of  the bulb, second portion revealed no abnormalities.   THERAPY/DIAGNOSTIC MANEUVERS PERFORMED:  1.  The esophageal mucosa was brushed for KOH prep.  2.  Subsequently, antral biopsies for histologic study were taken.   The patient tolerated the procedure well was reacted in endoscopy.   IMPRESSION:  1.  Raised cream-colored plaques involving the mid and distal esophagus most      consistent with candida esophagitis, KOH brushing pending.  Otherwise      normal esophagus.  2.  Some waisting midstomach with respiratory excursions consistent with a      very, very large hiatal hernia.  A glandular appearance to the antral      mucosa of uncertain significance, biopsied for histologic study.  Patent      pylorus, normal D1  and D1.  3.  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  Will await KOH prep for      confirmation and will treat accordingly.  4.  Continue Nexium 40 mg orally daily for the time being.  5.  Follow-up on biopsies.  6.  I think we need to go ahead and get an upper GI series to confirm that      Andre Holder has a large hiatal hernia and not a mixed or unusual-appearing      paraesophageal hernia.  Will order this x-ray in the near future.  7.  Further recommendations to follow.      RMR/MEDQ  D:  01/29/2005  T:  01/29/2005  Job:  [id number]   cc:   Kingsley Callander. Ouida Sills, MD  [address]  [place]  [place] [address]  Fax: [phone]

## 2011-01-24 NOTE — Op Note (Signed)
   NAME:  Andre Holder, Andre Holder                         ACCOUNT NO.:  [id number]   MEDICAL RECORD NO.:  [id number]                   PATIENT TYPE:  AMB   LOCATION:  DAY                                  FACILITY:  [facility]   PHYSICIAN:  Lionel December, M.D.                 DATE OF BIRTH:  [birth year]   DATE OF PROCEDURE:  04/04/2003  DATE OF DISCHARGE:                                 OPERATIVE REPORT   PROCEDURE:  Total colonoscopy.   INDICATIONS FOR PROCEDURE:  [name] is a 77 year old Caucasian male who  is undergoing screening colonoscopy.  The procedure was reviewed with the  patient and informed consent was obtained.   PREOPERATIVE MEDICATIONS:  Demerol 25 mg IV, Versed 3 mg IV.   FINDINGS:  The procedure was performed in the endoscopy suite.  The  patient's vital signs and O2 saturations were monitored during the procedure  and remained stable.  The patient was placed in the left lateral recumbent  position and rectal examination performed.  No abnormality noted on external  or digital exam.  The Olympus videoscope was placed into the rectum and  advanced into the region of the sigmoid colon and beyond.  The preparation  was satisfactory.  The scope was passed into the cecum which was identified  by the ileocecal valve.  The blunt cecum was examined and was normal.  There  was a small flat polyp at the cecum just across from the ileocecal valve.  This was ablated by cold biopsy.  As the scope was withdrawn, the rest of  the colonic mucosa was once again carefully examined and was normal  throughout.  The rectal mucosa was normal.  The scope was retroflexed to  examine the anorectal junction, and small hemorrhoids were noted below the  dentate line.  The endoscope was straightened and withdrawn.  The patient  tolerated the procedure well.   FINAL DIAGNOSES:  1. Small cecal polyp that was ablated by cold biopsy.  2. Small external hemorrhoids.   RECOMMENDATIONS:  He will resume his  usual medications and aspirin.  I will  contact the patient with the biopsy results and further recommendations.                                               Lionel December, M.D.    NR/MEDQ  D:  04/04/2003  T:  04/04/2003  Job:  [id number]   cc:   Kingsley Callander. Ouida Sills, M.D.  [address]  [place]  [place] [address]  Fax: [phone]

## 2011-05-07 ENCOUNTER — Encounter: Payer: Self-pay | Admitting: Cardiology

## 2011-05-07 DIAGNOSIS — M1990 Unspecified osteoarthritis, unspecified site: Secondary | ICD-10-CM | POA: Insufficient documentation

## 2011-05-08 ENCOUNTER — Encounter: Payer: Self-pay | Admitting: Cardiology

## 2011-05-08 ENCOUNTER — Ambulatory Visit (INDEPENDENT_AMBULATORY_CARE_PROVIDER_SITE_OTHER): Payer: Medicare Other | Admitting: Cardiology

## 2011-05-08 DIAGNOSIS — R634 Abnormal weight loss: Secondary | ICD-10-CM

## 2011-05-08 DIAGNOSIS — Z950 Presence of cardiac pacemaker: Secondary | ICD-10-CM

## 2011-05-08 DIAGNOSIS — M1990 Unspecified osteoarthritis, unspecified site: Secondary | ICD-10-CM

## 2011-05-08 DIAGNOSIS — I359 Nonrheumatic aortic valve disorder, unspecified: Secondary | ICD-10-CM

## 2011-05-08 DIAGNOSIS — D649 Anemia, unspecified: Secondary | ICD-10-CM

## 2011-05-08 DIAGNOSIS — I441 Atrioventricular block, second degree: Secondary | ICD-10-CM

## 2011-05-08 DIAGNOSIS — Z87891 Personal history of nicotine dependence: Secondary | ICD-10-CM

## 2011-05-08 DIAGNOSIS — I679 Cerebrovascular disease, unspecified: Secondary | ICD-10-CM

## 2011-05-08 DIAGNOSIS — R0989 Other specified symptoms and signs involving the circulatory and respiratory systems: Secondary | ICD-10-CM

## 2011-05-08 DIAGNOSIS — I739 Peripheral vascular disease, unspecified: Secondary | ICD-10-CM

## 2011-05-08 DIAGNOSIS — E785 Hyperlipidemia, unspecified: Secondary | ICD-10-CM

## 2011-05-08 NOTE — Assessment & Plan Note (Signed)
Repeat carotid ultrasound will be obtained to rule out significant progression of disease; daily aspirin therapy at low dose recommended for both cerebrovascular and coronary disease.

## 2011-05-08 NOTE — Assessment & Plan Note (Signed)
Hemoglobin has recovered towards normal, most recently measured at 12.2 with a borderline and MCV of 98 in 04/2010.  Patient reports that thyroid replacement therapy required adjustment downward.  Hyperthyroidism may have been contributing to anemia.  PCP will followup on this problem.

## 2011-05-08 NOTE — Progress Notes (Signed)
HPI : Andre Holder returns to the office as scheduled for continued assessment and treatment of mild coronary disease and mild aortic stenosis.  He has also had borderline hypertension not requiring medical therapy and hyperlipidemia that has.  His course of cardiovascular disease has been quite benign.  He reports that he was found to have iatrogenic hyperthyroidism, which prompted a decrease in his dose of levothyroxine.  Weight loss, which totaled 60 pounds over the past 20 years, has now reversed, but his weight is still less than it was at his previous visit one year ago.  Current Outpatient Prescriptions on File Prior to Visit  Medication Sig Dispense Refill  . levothyroxine (SYNTHROID, LEVOTHROID) 150 MCG tablet Take 125 mcg by mouth daily.       . simvastatin (ZOCOR) 40 MG tablet TAKE (1) TABLET BY MOUTH AT BEDTIME FORCHOLESTEROL.  30 tablet  3     No Known Allergies    Past medical history, social history, and family history reviewed and updated.  ROS: Denies dyspnea, orthopnea, PND, chest discomfort, palpitations or syncope.  Is had no cough or sputum production.  Appetite is normal.  PHYSICAL EXAM: BP 140/76  Pulse 69  Resp 18  Ht 6' (1.829 m)  Wt 167 lb 6.4 oz (75.932 kg)  BMI 22.70 kg/m2  SpO2 98%  General-Well developed; no acute distress Body habitus-thin Neck-No JVD; bilateral carotid bruits; tracheostomy; uses artificial larynx Lungs-clear lung fields; resonant to percussion Cardiovascular-normal PMI; normal S1 and S2; grade 2-3/6 basilar systolic ejection murmur with preserved aortic component of the second heart sound; normal carotid upstrokes Abdomen-normal bowel sounds; soft and non-tender without masses or organomegaly Musculoskeletal-No deformities, no cyanosis or clubbing Neurologic-Normal cranial nerves; symmetric strength and tone Skin-Warm, no significant lesions Extremities-distal pulses intact; 1+ ankle edema  Rhythm Strip: Normal sinus rhythm; IVCD; fairly  frequent PVCs.  ASSESSMENT AND PLAN:

## 2011-05-08 NOTE — Patient Instructions (Addendum)
Your physician has requested that you have a carotid duplex. This test is an ultrasound of the carotid arteries in your neck. It looks at blood flow through these arteries that supply the brain with blood. Allow one hour for this exam. There are no restrictions or special instructions.  Your physician has recommended you make the following change in your medication: start taking Aspirin 81 mg daily

## 2011-05-08 NOTE — Assessment & Plan Note (Addendum)
Lipid profile in 04/2010:115, 98, 43, 52. Control of hyperlipidemia is excellent.

## 2011-05-08 NOTE — Assessment & Plan Note (Signed)
Patient has been told that knee prostheses have deteriorated.  He is not interested in reoperation due to his advanced age although his physiologic age is lower and performance status excellent.

## 2011-05-08 NOTE — Assessment & Plan Note (Addendum)
Peak gradient of 18 mmHg in 2010 and 2011.  Although no gradient was apparent at cardiac catheterization in 2010, echocardiography and physical exam suggests very mild stenosis.  It is highly unlikely this will ever progress to the point where he develops symptoms unless he contracts endocarditis.

## 2011-05-08 NOTE — Assessment & Plan Note (Signed)
Attributed to iatrogenic hyperthyroidism.  Weight decreased to a nadir of 160, but has now increased since adjustment of thyroid replacement therapy.

## 2011-05-08 NOTE — Assessment & Plan Note (Signed)
Patient reports that pacemaker has not been assessed in some time.  An appointment with our electrophysiologist will be scheduled after which he will transfer his care to the [facility]

## 2011-05-13 ENCOUNTER — Ambulatory Visit (HOSPITAL_COMMUNITY): Payer: Medicare Other

## 2011-05-15 ENCOUNTER — Encounter: Payer: Self-pay | Admitting: *Deleted

## 2011-06-04 LAB — POCT HEMOGLOBIN-HEMACUE: Hemoglobin: 11.3 — ABNORMAL LOW

## 2011-06-06 ENCOUNTER — Ambulatory Visit (HOSPITAL_COMMUNITY)
Admission: RE | Admit: 2011-06-06 | Discharge: 2011-06-06 | Disposition: A | Discharge status: Home / Self Care | Payer: Non-veteran care | Source: Ambulatory Visit | Attending: Cardiology | Admitting: Cardiology

## 2011-06-06 DIAGNOSIS — R0989 Other specified symptoms and signs involving the circulatory and respiratory systems: Secondary | ICD-10-CM | POA: Insufficient documentation

## 2011-06-06 DIAGNOSIS — I658 Occlusion and stenosis of other precerebral arteries: Secondary | ICD-10-CM | POA: Insufficient documentation

## 2011-06-06 DIAGNOSIS — I6529 Occlusion and stenosis of unspecified carotid artery: Secondary | ICD-10-CM | POA: Insufficient documentation

## 2011-06-11 LAB — CBC
HCT: 36.7 — ABNORMAL LOW
MCV: 95.4
RBC: 3.84 — ABNORMAL LOW
WBC: 8.9

## 2011-06-11 LAB — COMPREHENSIVE METABOLIC PANEL
AST: 19
Alkaline Phosphatase: 83
BUN: 20
CO2: 31
Chloride: 105
Creatinine, Ser: 1.01
GFR calc Af Amer: >60
GFR calc non Af Amer: >60
Total Bilirubin: 0.7

## 2011-06-11 LAB — DIFFERENTIAL
Eosinophils Relative: 7 — ABNORMAL HIGH
Lymphocytes Relative: 25
Lymphs Abs: 2.3
Monocytes Absolute: 0.7
Monocytes Relative: 8

## 2011-06-11 LAB — URINALYSIS, ROUTINE W REFLEX MICROSCOPIC
Hgb urine dipstick: NEGATIVE
Nitrite: NEGATIVE
Specific Gravity, Urine: >1.03 — ABNORMAL HIGH
pH: 5

## 2011-06-26 ENCOUNTER — Telehealth: Payer: Self-pay | Admitting: Internal Medicine

## 2011-06-26 NOTE — Telephone Encounter (Signed)
Echo,Cath faxed to [facility] [employer] @ [phone]  06/26/11/km

## 2011-07-15 ENCOUNTER — Other Ambulatory Visit: Payer: Self-pay

## 2011-07-15 ENCOUNTER — Emergency Department (HOSPITAL_COMMUNITY): Payer: Medicare Other

## 2011-07-15 ENCOUNTER — Encounter (HOSPITAL_COMMUNITY): Payer: Self-pay | Admitting: *Deleted

## 2011-07-15 ENCOUNTER — Inpatient Hospital Stay (HOSPITAL_COMMUNITY)
Admission: EM | Admit: 2011-07-15 | Discharge: 2011-07-16 | DRG: 313 | Disposition: A | Discharge status: Home / Self Care | Payer: Medicare Other | Attending: Pulmonary Disease | Admitting: Pulmonary Disease

## 2011-07-15 DIAGNOSIS — Z950 Presence of cardiac pacemaker: Secondary | ICD-10-CM

## 2011-07-15 DIAGNOSIS — E039 Hypothyroidism, unspecified: Secondary | ICD-10-CM | POA: Diagnosis present

## 2011-07-15 DIAGNOSIS — R0789 Other chest pain: Principal | ICD-10-CM | POA: Diagnosis present

## 2011-07-15 DIAGNOSIS — I2510 Atherosclerotic heart disease of native coronary artery without angina pectoris: Secondary | ICD-10-CM | POA: Diagnosis present

## 2011-07-15 DIAGNOSIS — Z8521 Personal history of malignant neoplasm of larynx: Secondary | ICD-10-CM

## 2011-07-15 DIAGNOSIS — E785 Hyperlipidemia, unspecified: Secondary | ICD-10-CM | POA: Diagnosis present

## 2011-07-15 LAB — BASIC METABOLIC PANEL
CO2: 28 mEq/L (ref 19–32)
Calcium: 9.7 mg/dL (ref 8.4–10.5)
GFR calc Af Amer: >90 mL/min (ref >90)
GFR calc non Af Amer: 82 mL/min — ABNORMAL LOW (ref >90)
Sodium: 126 mEq/L — ABNORMAL LOW (ref 135–145)

## 2011-07-15 LAB — CBC
MCH: 33.1 pg (ref 26.0–34.0)
Platelets: 255 10*3/uL (ref 150–400)
RBC: 3.56 MIL/uL — ABNORMAL LOW (ref 4.22–5.81)
WBC: 6.6 10*3/uL (ref 4.0–10.5)

## 2011-07-15 LAB — POCT I-STAT TROPONIN I: Troponin i, poc: 0 ng/mL (ref 0.00–0.08)

## 2011-07-15 LAB — CARDIAC PANEL(CRET KIN+CKTOT+MB+TROPI)
CK, MB: 4.1 ng/mL — ABNORMAL HIGH (ref 0.3–4.0)
Troponin I: <0.3 ng/mL (ref <0.30)

## 2011-07-15 MED ORDER — SIMVASTATIN 20 MG PO TABS: 40.0000 mg | ORAL_TABLET | Freq: Every day | ORAL | Status: DC

## 2011-07-15 MED ORDER — SODIUM CHLORIDE 0.9 % IV SOLN
INTRAVENOUS | Status: DC
  Administered 2011-07-15: 23:00:00 via INTRAVENOUS

## 2011-07-15 MED ORDER — ENOXAPARIN SODIUM 40 MG/0.4ML ~~LOC~~ SOLN
40.0000 mg | SUBCUTANEOUS | Status: DC
  Filled 2011-07-15: qty 0.4

## 2011-07-15 MED ORDER — NITROGLYCERIN 0.4 MG SL SUBL: 0.4000 mg | SUBLINGUAL_TABLET | SUBLINGUAL | Status: DC | PRN

## 2011-07-15 MED ORDER — ASPIRIN 81 MG PO CHEW: 81.0000 mg | CHEWABLE_TABLET | Freq: Every day | ORAL | Status: DC

## 2011-07-15 MED ORDER — LEVOTHYROXINE SODIUM 25 MCG PO TABS
125.0000 ug | ORAL_TABLET | Freq: Every day | ORAL | Status: DC
  Administered 2011-07-16: 125 ug via ORAL
  Filled 2011-07-15: qty 1

## 2011-07-15 NOTE — ED Notes (Signed)
Telephone admit orders received from Dr. Felecia Shelling

## 2011-07-15 NOTE — ED Notes (Signed)
Chest pain , headache.

## 2011-07-15 NOTE — ED Provider Notes (Addendum)
History     CSN: [id number] Arrival date & time: 07/15/2011  6:56 PM   First MD Initiated Contact with Patient 07/15/11 1906      Chief Complaint  Patient presents with  . Chest Pain    (Consider location/radiation/quality/duration/timing/severity/associated sxs/prior treatment) Patient is a 77 y.o. male presenting with chest pain. The history is provided by the patient. History Limited By: s/p laryngectomy.  Chest Pain The chest pain began 3 - 5 hours ago. Chest pain occurs constantly. The chest pain is resolved. The severity of the pain is moderate. The pain does not radiate. Exacerbated by: nothing. Primary symptoms include shortness of breath. Pertinent negatives for primary symptoms include no fever, no cough, no nausea and no vomiting. He tried aspirin for the symptoms.     Past Medical History  Diagnosis Date  . Arteriosclerotic cardiovascular disease (ASCVD)     Nonobstructive; 09/2008 50% proximal and 40% mid LAD; 25% circumflex; 30% RCA; mild global LV dysfunction with EF of 45%. No aortic stenosis.  . Mild aortic stenosis     not documented at catheterization; verified by echo in 2011  . Peripheral vascular disease     With a 70% innominate artery stenosis and nonobstructive carotid stenosis  . Hypothyroidism   . Degenerative joint disease     s/p bilateral TKR  . Mobitz (type) II atrioventricular block     With bradycardia; Medtronic pacemaker implanted in 09/2008  . Tobacco abuse, in remission     Remote  . GERD (gastroesophageal reflux disease)   . Hyperlipidemia     Lipid profile in 04/2010:115, 98, 43, 52.  . Cancer of larynx     laryngectomy in 1988; postoperative radiation therapy  . Weight loss     50 pounds between 1991 and 2011  . 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 Adrenal hyperplasia     Stable on serial imaging  . Anemia     minimal in 2011 with hemoglobin of 12.2 and high normal MCV  . Borderline  hypertension     Normal CMet in 2011    Past Surgical History  Procedure Date  . Laryngectomy 1988    S/P laryngectomy and radiation therapy  . Appendectomy 1973  . Knee arthroscopy     Left  . Total knee arthroplasty     Bilateral, 19 years ago  . Cataract extraction, bilateral   . Decompression facial nerve     Right median  . Pacemaker insertion     Family History  Problem Relation Age of Onset  . Stroke Mother   . Leukemia Father   . Colon cancer Neg Hx   . Liver disease Neg Hx   . GI problems Neg Hx   . Stroke Other   . Diabetes Other     History  Substance Use Topics  . Smoking status: Former Games developer  . Smokeless tobacco: Not on file   Comment: Quit 30 years  . Alcohol Use: No      Review of Systems  Constitutional: Negative for fever.  Respiratory: Positive for shortness of breath. Negative for cough.   Cardiovascular: Positive for chest pain.  Gastrointestinal: Negative for nausea and vomiting.  All other systems reviewed and are negative.    Allergies  Review of patient's allergies indicates no known allergies.  Home Medications   Current Outpatient Rx  Name Route Sig Dispense Refill  . ASPIRIN 81 MG PO TABS  Oral Take 81 mg by mouth daily.      Marland Kitchen SIMVASTATIN 40 MG PO TABS  TAKE (1) TABLET BY MOUTH AT BEDTIME FORCHOLESTEROL. 30 tablet 3    Temp(Src) 98 F (36.7 C) (Oral)  Ht 6\' 2"  (1.88 m)  Wt 176 lb (79.833 kg)  BMI 22.60 kg/m2  Physical Exam  Constitutional: He is oriented to person, place, and time. He appears well-developed and well-nourished.  HENT:  Head: Normocephalic and atraumatic.  Eyes: Conjunctivae and EOM are normal. Pupils are equal, round, and reactive to light.  Neck: Normal range of motion.       Patient with patent tracheostomy without redness  Cardiovascular: Normal rate and regular rhythm.   Pulmonary/Chest: Effort normal and breath sounds normal.  Abdominal: Soft. Bowel sounds are normal.  Musculoskeletal: Normal  range of motion.  Neurological: He is alert and oriented to person, place, and time. He has normal reflexes.  Skin: Skin is warm and dry.  Psychiatric: He has a normal mood and affect.    ED Course  Procedures (including critical care time)  Labs Reviewed - No data to display No results found.   No diagnosis found.    MDM   Date: 07/15/2011  Rate: 65  Rhythm: normal sinus rhythm with pvc and paced beats  QRS Axis: normal  Intervals: normal  ST/T Wave abnormalities: nonspecific ST/T changes  Conduction Disutrbances:first-degree A-V block   Narrative Interpretation:   Old EKG Reviewed: unchanged     Results for orders placed during the hospital encounter of 07/15/11  CARDIAC PANEL(CRET KIN+CKTOT+MB+TROPI)      Component Value Range   Total CK 155  7 - 232 (U/L)   CK, MB 4.1 (*) 0.3 - 4.0 (ng/mL)   Troponin I <0.30  <0.30 (ng/mL)   Relative Index 2.6 (*) 0.0 - 2.5   CBC      Component Value Range   WBC 6.6  4.0 - 10.5 (K/uL)   RBC 3.56 (*) 4.22 - 5.81 (MIL/uL)   Hemoglobin 11.8 (*) 13.0 - 17.0 (g/dL)   HCT 45.4 (*) 09.8 - 52.0 (%)   MCV 94.7  78.0 - 100.0 (fL)   MCH 33.1  26.0 - 34.0 (pg)   MCHC 35.0  30.0 - 36.0 (g/dL)   RDW 11.9  14.7 - 82.9 (%)   Platelets 255  150 - 400 (K/uL)  BASIC METABOLIC PANEL      Component Value Range   Sodium 126 (*) 135 - 145 (mEq/L)   Potassium 4.2  3.5 - 5.1 (mEq/L)   Chloride 91 (*) 96 - 112 (mEq/L)   CO2 28  19 - 32 (mEq/L)   Glucose, Bld 85  70 - 99 (mg/dL)   BUN 14  6 - 23 (mg/dL)   Creatinine, Ser 5.62  0.50 - 1.35 (mg/dL)   Calcium 9.7  8.4 - 13.0 (mg/dL)   GFR calc non Af Amer 82 (*) >90 (mL/min)   GFR calc Af Amer >90  >90 (mL/min)  POCT I-STAT TROPONIN I      Component Value Range   Troponin i, poc 0.00  0.00 - 0.08 (ng/mL)   Comment 3               Patient with chest pain here today. EKG is nondiagnostic with left bundle branch block and paced rhythms.  He has a normal troponin has been pain-free here in the  department. He received aspirin prehospital. His anemia appears stable stable with the last hemoglobin  recorded at 12.2 and 11.8 today. He is hyponatremic with a sodium of 126.  Hilario Quarry, MD 07/15/11 6213  Hilario Quarry, MD 07/15/11 2052

## 2011-07-15 NOTE — ED Notes (Signed)
Seen at [facility] today for check of pacemaker.  Began having cp 2-3 hours ago.

## 2011-07-15 NOTE — ED Notes (Signed)
Pt resting quietly with no complaints.  VS stable.

## 2011-07-15 NOTE — ED Notes (Signed)
Pt reports seeing

## 2011-07-16 LAB — CARDIAC PANEL(CRET KIN+CKTOT+MB+TROPI): Troponin I: <0.3 ng/mL (ref <0.30)

## 2011-07-16 MED ORDER — SODIUM CHLORIDE 0.9 % IJ SOLN
INTRAMUSCULAR | Status: AC
  Administered 2011-07-16: 01:00:00
  Filled 2011-07-16: qty 3

## 2011-07-16 MED ORDER — NITROGLYCERIN 0.4 MG SL SUBL: 0.4000 mg | SUBLINGUAL_TABLET | SUBLINGUAL | Status: DC | PRN

## 2011-07-16 NOTE — H&P (Signed)
NAME:  BATES, COLLINGTON NO.:  [id number]  MEDICAL RECORD NO.:  [id number]  LOCATION:  A317                          FACILITY:  [facility]  PHYSICIAN:  Logyn Kendrick D. Felecia Shelling, MD   DATE OF BIRTH:  03-13-1923  DATE OF ADMISSION:  07/15/2011 DATE OF DISCHARGE:  LH                             HISTORY & PHYSICAL   CHIEF COMPLAINT:  Chest pain.  HISTORY OF PRESENT ILLNESS:  This is an 77 year old male patient with history of multiple medical illnesses came to emergency room with complaint of chest pain.  The patient had an appointment with the [facility] for a checkup of his pacemaker.  On his way back home, he had a chest pain and some dizziness.  He took aspirin and after he came to emergency room, his chest pain resolved.  The patient has coronary artery disease and he is high risk for unstable angina.  His initial EKG and cardiac enzymes were negative for any acute changes.  However, the patient is admitted to further evaluate and do serial EKG and cardiac enzymes.  REVIEW OF SYSTEMS:  No fever, chills, cough, shortness of breath, nausea, vomiting, abdominal pain, dysuria, urgency, or frequency of urination.  PAST MEDICAL HISTORY: 1. Nonobstructive coronary artery disease. 2. Peripheral vascular disease. 3. Hypothyroidism. 4. Degenerative joint disease. 5. History of laryngeal cancer and status post laryngectomy.  CURRENT MEDICATIONS: 1. Aspirin 81 mg p.o. daily. 2. Levothyroxine 125 mcg daily. 3. Zocor 40 mg daily.  SOCIAL HISTORY:  The patient has a history of remote tobacco use. No history of alcohol or substance abuse.  FAMILY HISTORY:  The patient's mother had CVA and his father had a leukopenia.  No history of cancer in the family.  PHYSICAL EXAMINATION:  GENERAL:  The patient is alert, awake, and pleasant looking. VITAL SIGNS:  Blood pressure 117/54, pulse 60, respiratory rate 15, and temperature 98 degrees Fahrenheit. HEENT:  Pupils are equal,  reactive. NECK:  Supple. CHEST:  Clear lung fields.  Good air entry.  CARDIOVASCULAR:  First and second heart sounds heard.  No murmur.  No gallop. ABDOMEN:  Soft and lax.  Bowel sounds positive.  No mass or organomegaly. EXTREMITIES:  No leg edema.  LABORATORY DATA:  On admission:  CBC, WBC 6.6, hemoglobin 11.8, hematocrit 33.7, and platelets 256.  BMP:  Sodium 126 potassium 4.2, chloride 91, carbon dioxide 28, glucose 85, BUN 14, creatinine 0.72, and calcium 9.7.  CPK total 155, CK-MB 4.1, troponin less than 0.003.  ASSESSMENT: 1. Chest pain, rule out unstable angina. 2. History of coronary artery disease. 3. History of laryngeal carcinoma and status post laryngectomy. 4. Hypothyroidism. 5. Hyperlipidemia.  PLAN:  We will admit the patient and do serial EKG and cardiac enzymes. We will continue on telemetry, and I will continue the patient on his regular medications.     Ciria Bernardini D. Felecia Shelling, MD     TDF/MEDQ  D:  07/16/2011  T:  07/16/2011  Job:  [id number]

## 2011-07-16 NOTE — Progress Notes (Signed)
Subjective: This is a patient of Dr. Alonza Smoker was admitted with chest pain. His chest pain has resolved and actually had resolved to before he got to the emergency room. However he does have a significant cardiac history and he was admitted to rule out myocardial infarction.  Objective: Vital signs in last 24 hours: Temp:  [98 F (36.7 C)-98.3 F (36.8 C)] 98.1 F (36.7 C) (11/07 0526) Pulse Rate:  [60-94] 94  (11/07 0526) Resp:  [15-18] 18  (11/07 0526) BP: (117-146)/(54-68) 119/68 mmHg (11/07 0526) SpO2:  [94 %-100 %] 94 % (11/07 0526) Weight:  [79.833 kg (176 lb)] 176 lb (79.833 kg) (11/06 2237) Weight change:  Last BM Date: 07/14/11  Intake/Output from previous day: 11/06 0701 - 11/07 0700 In: 475 [I.V.:475] Out: 400 [Urine:400]  PHYSICAL EXAM General appearance: alert, cooperative, no distress and Permanent tracheostomy Resp: clear to auscultation bilaterally Cardio: regular rate and rhythm, S1, S2 normal, no murmur, click, rub or gallop GI: soft, non-tender; bowel sounds normal; no masses,  no organomegaly Extremities: extremities normal, atraumatic, no cyanosis or edema  Lab Results:    Basic Metabolic Panel:  Basename 07/15/11 1935  NA 126*  K 4.2  CL 91*  CO2 28  GLUCOSE 85  BUN 14  CREATININE 0.72  CALCIUM 9.7  MG --  PHOS --   Liver Function Tests: No results found for this basename: AST:2,ALT:2,ALKPHOS:2,BILITOT:2,PROT:2,ALBUMIN:2 in the last 72 hours No results found for this basename: LIPASE:2,AMYLASE:2 in the last 72 hours No results found for this basename: AMMONIA:2 in the last 72 hours CBC:  Basename 07/15/11 1935  WBC 6.6  NEUTROABS --  HGB 11.8*  HCT 33.7*  MCV 94.7  PLT 255   Cardiac Enzymes:  Basename 07/16/11 0336 07/15/11 1935  CKTOTAL 113 155  CKMB 3.5 4.1*  CKMBINDEX -- --  TROPONINI <0.30 <0.30   BNP: No results found for this basename: POCBNP:3 in the last 72 hours D-Dimer: No results found for this basename: DDIMER:2  in the last 72 hours CBG: No results found for this basename: GLUCAP:6 in the last 72 hours Hemoglobin A1C: No results found for this basename: HGBA1C in the last 72 hours Fasting Lipid Panel: No results found for this basename: CHOL,HDL,LDLCALC,TRIG,CHOLHDL,LDLDIRECT in the last 72 hours Thyroid Function Tests: No results found for this basename: TSH,T4TOTAL,FREET4,T3FREE,THYROIDAB in the last 72 hours Anemia Panel: No results found for this basename: VITAMINB12,FOLATE,FERRITIN,TIBC,IRON,RETICCTPCT in the last 72 hours Coagulation: No results found for this basename: LABPROT:2,INR:2 in the last 72 hours Urine Drug Screen:  Alcohol Level: No results found for this basename: ETH:2 in the last 72 hours Urinalysis:  Misc. Labs:  ABGS No results found for this basename: PHART,PCO2,PO2ART,TCO2,HCO3 in the last 72 hours CULTURES No results found for this or any previous visit (from the past 240 hour(s)). Studies/Results: Dg Chest Portable 1 View  07/15/2011  *RADIOLOGY REPORT*  Clinical Data: Chest pain.  History of laryngeal cancer.  PORTABLE CHEST - 1 VIEW  Comparison: 06/06/2010.  Findings: Sequential pacemaker enters from the left.  Leads appear to be in a similar position to that of the prior exam without obvious interruption.  Cardiomegaly.  Central pulmonary vascular prominence.  Chronically elevated left hemidiaphragm limiting evaluation of the left lung base.  Colonic interposition on the right limiting evaluation for possibility of detection of free intraperitoneal air.  Calcified tortuous aorta.  No gross pneumothorax.  The patient would eventually benefit from follow-up two-view chest with cardiac leads removed.  IMPRESSION: Sequential pacemaker enters  from the left.  Leads appear to be in a similar position to that of the prior exam without obvious interruption.  Cardiomegaly.  Central pulmonary vascular prominence.  Chronically elevated left hemidiaphragm limiting evaluation of the  left lung base.  Calcified tortuous aorta.  Original Report Authenticated By: Fuller Canada, M.D.    Medications:  Prior to Admission:  Prescriptions prior to admission  Medication Sig Dispense Refill  . aspirin 81 MG tablet Take 81 mg by mouth daily.        Marland Kitchen levothyroxine (SYNTHROID, LEVOTHROID) 125 MCG tablet Take 125 mcg by mouth daily.        . simvastatin (ZOCOR) 40 MG tablet         Scheduled:   . aspirin  81 mg Oral Daily  . enoxaparin  40 mg Subcutaneous Q24H  . levothyroxine  125 mcg Oral QAC breakfast  . simvastatin  40 mg Oral q1800  . sodium chloride       Continuous:   . sodium chloride 75 mL/hr at 07/15/11 2320   ZOX:[id number]  Assesment: He has ruled out for myocardial infarction. He says he wants to go home. Active Problems:  * No active hospital problems. *     Plan: Discharge home today    LOS: 1 day   Jyl Chico L 07/16/2011, 8:55 AM

## 2011-07-16 NOTE — Discharge Summary (Signed)
Physician Discharge Summary  Patient ID: Andre Holder MRN: [id number] DOB/AGE: [birth year] 77 y.o. Primary Care Physician:FAGAN,ROY, MD Admit date: 07/15/2011 Discharge date: 07/16/2011    Discharge Diagnoses:  Chest pain myocardial infarction ruled out Hypothyroidism Status post permanent laryngectomy Hyperlipidemia Coronary artery occlusive disease Active Problems:  * No active hospital problems. *    Current Discharge Medication List    START taking these medications   Details  nitroGLYCERIN (NITROSTAT) 0.4 MG SL tablet Place 1 tablet (0.4 mg total) under the tongue every 5 (five) minutes x 3 doses as needed for chest pain. Qty: 25 tablet, Refills: 12      CONTINUE these medications which have NOT CHANGED   Details  aspirin 81 MG tablet Take 81 mg by mouth daily.      levothyroxine (SYNTHROID, LEVOTHROID) 125 MCG tablet Take 125 mcg by mouth daily.      simvastatin (ZOCOR) 40 MG tablet          Discharged Condition: Improved    Consults: None  Significant Diagnostic Studies: Dg Chest Portable 1 View  07/15/2011  *RADIOLOGY REPORT*  Clinical Data: Chest pain.  History of laryngeal cancer.  PORTABLE CHEST - 1 VIEW  Comparison: 06/06/2010.  Findings: Sequential pacemaker enters from the left.  Leads appear to be in a similar position to that of the prior exam without obvious interruption.  Cardiomegaly.  Central pulmonary vascular prominence.  Chronically elevated left hemidiaphragm limiting evaluation of the left lung base.  Colonic interposition on the right limiting evaluation for possibility of detection of free intraperitoneal air.  Calcified tortuous aorta.  No gross pneumothorax.  The patient would eventually benefit from follow-up two-view chest with cardiac leads removed.  IMPRESSION: Sequential pacemaker enters from the left.  Leads appear to be in a similar position to that of the prior exam without obvious interruption.  Cardiomegaly.  Central pulmonary  vascular prominence.  Chronically elevated left hemidiaphragm limiting evaluation of the left lung base.  Calcified tortuous aorta.  Original Report Authenticated By: Fuller Canada, M.D.    Lab Results: Results for orders placed during the hospital encounter of 07/15/11 (from the past 48 hour(s))  CARDIAC PANEL(CRET KIN+CKTOT+MB+TROPI)     Status: Abnormal   Collection Time   07/15/11  7:35 PM      Component Value Range Comment   Total CK 155  7 - 232 (U/L)    CK, MB 4.1 (*) 0.3 - 4.0 (ng/mL)    Troponin I <0.30  <0.30 (ng/mL)    Relative Index 2.6 (*) 0.0 - 2.5    CBC     Status: Abnormal   Collection Time   07/15/11  7:35 PM      Component Value Range Comment   WBC 6.6  4.0 - 10.5 (K/uL)    RBC 3.56 (*) 4.22 - 5.81 (MIL/uL)    Hemoglobin 11.8 (*) 13.0 - 17.0 (g/dL)    HCT 40.9 (*) 81.1 - 52.0 (%)    MCV 94.7  78.0 - 100.0 (fL)    MCH 33.1  26.0 - 34.0 (pg)    MCHC 35.0  30.0 - 36.0 (g/dL)    RDW 91.4  78.2 - 95.6 (%)    Platelets 255  150 - 400 (K/uL)   BASIC METABOLIC PANEL     Status: Abnormal   Collection Time   07/15/11  7:35 PM      Component Value Range Comment   Sodium 126 (*) 135 - 145 (mEq/L)  Potassium 4.2  3.5 - 5.1 (mEq/L)    Chloride 91 (*) 96 - 112 (mEq/L)    CO2 28  19 - 32 (mEq/L)    Glucose, Bld 85  70 - 99 (mg/dL)    BUN 14  6 - 23 (mg/dL)    Creatinine, Ser 4.09  0.50 - 1.35 (mg/dL)    Calcium 9.7  8.4 - 10.5 (mg/dL)    GFR calc non Af Amer 82 (*) >90 (mL/min)    GFR calc Af Amer >90  >90 (mL/min)   POCT I-STAT TROPONIN I     Status: Normal   Collection Time   07/15/11  7:55 PM      Component Value Range Comment   Troponin i, poc 0.00  0.00 - 0.08 (ng/mL)    Comment 3            CARDIAC PANEL(CRET KIN+CKTOT+MB+TROPI)     Status: Abnormal   Collection Time   07/16/11  3:36 AM      Component Value Range Comment   Total CK 113  7 - 232 (U/L)    CK, MB 3.5  0.3 - 4.0 (ng/mL)    Troponin I <0.30  <0.30 (ng/mL)    Relative Index 3.1 (*) 0.0 - 2.5       No results found for this or any previous visit (from the past 240 hour(s)).   Hospital Course: He was admitted for chest discomfort. He ruled out for myocardial infarction was a felt back to normal and wanted to go home the next morning.  Discharge Exam: Blood pressure 119/68, pulse 94, temperature 98.1 F (36.7 C), temperature source Oral, resp. rate 18, height 6\' 2"  (1.88 m), weight 79.833 kg (176 lb), SpO2 94.00%. He has a permanent laryngectomy his chest is clear his heart is regular without gallop  Disposition: Home      Signed: Sha Burling L 07/16/2011, 9:08 AM

## 2011-07-16 NOTE — Progress Notes (Signed)
Patient discharged to home in the care of family with no complaints of chest pain or signs of distress. Discharge instructions read to patient. Patient verbalized understanding.  Script and discharge instructions given to patient Discharged to home.

## 2011-09-04 ENCOUNTER — Other Ambulatory Visit (HOSPITAL_COMMUNITY): Payer: Self-pay | Admitting: Internal Medicine

## 2011-09-04 DIAGNOSIS — N400 Enlarged prostate without lower urinary tract symptoms: Secondary | ICD-10-CM

## 2011-09-08 ENCOUNTER — Ambulatory Visit (HOSPITAL_COMMUNITY)
Admission: RE | Admit: 2011-09-08 | Discharge: 2011-09-08 | Disposition: A | Discharge status: Home / Self Care | Payer: Medicare Other | Source: Ambulatory Visit | Attending: Internal Medicine | Admitting: Internal Medicine

## 2011-09-08 DIAGNOSIS — I10 Essential (primary) hypertension: Secondary | ICD-10-CM | POA: Insufficient documentation

## 2011-09-08 DIAGNOSIS — N400 Enlarged prostate without lower urinary tract symptoms: Secondary | ICD-10-CM | POA: Insufficient documentation

## 2011-09-15 ENCOUNTER — Other Ambulatory Visit: Payer: Self-pay

## 2011-09-15 ENCOUNTER — Emergency Department (HOSPITAL_COMMUNITY): Payer: Medicare HMO

## 2011-09-15 ENCOUNTER — Observation Stay (HOSPITAL_COMMUNITY)
Admission: EM | Admit: 2011-09-15 | Discharge: 2011-09-18 | Disposition: A | Discharge status: Home / Self Care | Payer: Medicare HMO | Attending: Internal Medicine | Admitting: Internal Medicine

## 2011-09-15 ENCOUNTER — Encounter (HOSPITAL_COMMUNITY): Payer: Self-pay | Admitting: Emergency Medicine

## 2011-09-15 DIAGNOSIS — E785 Hyperlipidemia, unspecified: Secondary | ICD-10-CM | POA: Insufficient documentation

## 2011-09-15 DIAGNOSIS — E039 Hypothyroidism, unspecified: Secondary | ICD-10-CM | POA: Insufficient documentation

## 2011-09-15 DIAGNOSIS — E871 Hypo-osmolality and hyponatremia: Secondary | ICD-10-CM

## 2011-09-15 DIAGNOSIS — Z79899 Other long term (current) drug therapy: Secondary | ICD-10-CM | POA: Insufficient documentation

## 2011-09-15 DIAGNOSIS — M51379 Other intervertebral disc degeneration, lumbosacral region without mention of lumbar back pain or lower extremity pain: Secondary | ICD-10-CM | POA: Insufficient documentation

## 2011-09-15 DIAGNOSIS — E860 Dehydration: Secondary | ICD-10-CM

## 2011-09-15 DIAGNOSIS — I658 Occlusion and stenosis of other precerebral arteries: Secondary | ICD-10-CM | POA: Insufficient documentation

## 2011-09-15 DIAGNOSIS — R269 Unspecified abnormalities of gait and mobility: Secondary | ICD-10-CM | POA: Insufficient documentation

## 2011-09-15 DIAGNOSIS — M1990 Unspecified osteoarthritis, unspecified site: Secondary | ICD-10-CM | POA: Insufficient documentation

## 2011-09-15 DIAGNOSIS — G609 Hereditary and idiopathic neuropathy, unspecified: Secondary | ICD-10-CM | POA: Insufficient documentation

## 2011-09-15 DIAGNOSIS — I2510 Atherosclerotic heart disease of native coronary artery without angina pectoris: Secondary | ICD-10-CM | POA: Insufficient documentation

## 2011-09-15 DIAGNOSIS — N400 Enlarged prostate without lower urinary tract symptoms: Secondary | ICD-10-CM | POA: Insufficient documentation

## 2011-09-15 DIAGNOSIS — R42 Dizziness and giddiness: Principal | ICD-10-CM | POA: Insufficient documentation

## 2011-09-15 DIAGNOSIS — Z8521 Personal history of malignant neoplasm of larynx: Secondary | ICD-10-CM | POA: Insufficient documentation

## 2011-09-15 DIAGNOSIS — Z950 Presence of cardiac pacemaker: Secondary | ICD-10-CM | POA: Insufficient documentation

## 2011-09-15 DIAGNOSIS — I6529 Occlusion and stenosis of unspecified carotid artery: Secondary | ICD-10-CM | POA: Insufficient documentation

## 2011-09-15 DIAGNOSIS — Z8673 Personal history of transient ischemic attack (TIA), and cerebral infarction without residual deficits: Secondary | ICD-10-CM | POA: Insufficient documentation

## 2011-09-15 DIAGNOSIS — M5137 Other intervertebral disc degeneration, lumbosacral region: Secondary | ICD-10-CM | POA: Insufficient documentation

## 2011-09-15 LAB — URINALYSIS, ROUTINE W REFLEX MICROSCOPIC
Glucose, UA: NEGATIVE mg/dL
Leukocytes, UA: NEGATIVE
Protein, ur: NEGATIVE mg/dL
Specific Gravity, Urine: 1.015 (ref 1.005–1.030)
pH: 6 (ref 5.0–8.0)

## 2011-09-15 LAB — DIFFERENTIAL
Basophils Absolute: 0.1 10*3/uL (ref 0.0–0.1)
Lymphs Abs: 1.2 10*3/uL (ref 0.7–4.0)
Neutrophils Relative %: 60 % (ref 43–77)

## 2011-09-15 LAB — CBC
Hemoglobin: 11.1 g/dL — ABNORMAL LOW (ref 13.0–17.0)
MCH: 33.2 pg (ref 26.0–34.0)
MCHC: 35 g/dL (ref 30.0–36.0)
MCV: 94.9 fL (ref 78.0–100.0)
RBC: 3.34 MIL/uL — ABNORMAL LOW (ref 4.22–5.81)

## 2011-09-15 LAB — BASIC METABOLIC PANEL
CO2: 27 mEq/L (ref 19–32)
Chloride: 97 mEq/L (ref 96–112)
Sodium: 131 mEq/L — ABNORMAL LOW (ref 135–145)

## 2011-09-15 MED ORDER — MECLIZINE HCL 12.5 MG PO TABS
25.0000 mg | ORAL_TABLET | Freq: Once | ORAL | Status: AC
  Administered 2011-09-15: 25 mg via ORAL
  Filled 2011-09-15: qty 2

## 2011-09-15 MED ORDER — TAMSULOSIN HCL 0.4 MG PO CAPS
0.4000 mg | ORAL_CAPSULE | Freq: Every day | ORAL | Status: DC
  Administered 2011-09-16 – 2011-09-18 (×3): 0.4 mg via ORAL
  Filled 2011-09-15 (×3): qty 1

## 2011-09-15 MED ORDER — SODIUM CHLORIDE 0.9 % IV SOLN: INTRAVENOUS | Status: DC

## 2011-09-15 MED ORDER — SIMVASTATIN 20 MG PO TABS
40.0000 mg | ORAL_TABLET | Freq: Every day | ORAL | Status: DC
  Administered 2011-09-15 – 2011-09-17 (×3): 40 mg via ORAL
  Filled 2011-09-15 (×3): qty 2

## 2011-09-15 MED ORDER — ALPRAZOLAM 0.25 MG PO TABS
0.2500 mg | ORAL_TABLET | Freq: Every evening | ORAL | Status: DC | PRN
  Administered 2011-09-15 – 2011-09-17 (×3): 0.25 mg via ORAL
  Filled 2011-09-15 (×3): qty 1

## 2011-09-15 MED ORDER — ASPIRIN 81 MG PO CHEW
81.0000 mg | CHEWABLE_TABLET | Freq: Every day | ORAL | Status: DC
  Administered 2011-09-16 – 2011-09-18 (×3): 81 mg via ORAL
  Filled 2011-09-15 (×3): qty 1

## 2011-09-15 MED ORDER — LEVOTHYROXINE SODIUM 75 MCG PO TABS
150.0000 ug | ORAL_TABLET | Freq: Every day | ORAL | Status: DC
  Administered 2011-09-16 – 2011-09-18 (×3): 150 ug via ORAL
  Filled 2011-09-15 (×3): qty 2

## 2011-09-15 MED ORDER — ENOXAPARIN SODIUM 40 MG/0.4ML ~~LOC~~ SOLN
40.0000 mg | SUBCUTANEOUS | Status: DC
  Administered 2011-09-16 – 2011-09-17 (×2): 40 mg via SUBCUTANEOUS
  Filled 2011-09-15 (×2): qty 0.4

## 2011-09-15 MED ORDER — SODIUM CHLORIDE 0.9 % IV BOLUS (SEPSIS)
250.0000 mL | Freq: Once | INTRAVENOUS | Status: AC
  Administered 2011-09-15: 250 mL via INTRAVENOUS

## 2011-09-15 NOTE — ED Notes (Signed)
Dizziness increasing for several weeks now. Pt states stays about same when laying down. Pt wife states pt has blockages of both corotids. Pt states having indigestion for several months as well.

## 2011-09-15 NOTE — ED Provider Notes (Signed)
History   Scribed for Joya Gaskins, MD, the patient was seen in [facility]. The chart was scribed by Gilman Schmidt. The patients care was started at 10:20 AM.   CSN: [id number]  Arrival date & time 09/15/11  0919   First MD Initiated Contact with Patient 09/15/11 [phone]      Chief Complaint  Patient presents with  . Dizziness  . Weakness     HPI Andre Holder is a 79 y.o. male who presents to the Emergency Department complaining of dizziness and weakness onset several weeks. States he is unable to balance himself when he gets up. Pt states symptoms persist even when laying down. Wife states he fell about three times over the past few weeks. Wife also notes has blockages of both carotids and indigestion for several months. Also notes SOB. Denies any headache, chest pain, abdominal pain, diarrhea, or facial numbness. Pt has pacemake that has been implanted for about two years. Pt also had laryngectomy and uses a voice box. His symptoms are worsening Course -worsening Improved by - nothing PCP: Dr. Ouida Sills & the [facility]  Past Medical History  Diagnosis Date  . Arteriosclerotic cardiovascular disease (ASCVD)     Nonobstructive; 09/2008 50% proximal and 40% mid LAD; 25% circumflex; 30% RCA; mild global LV dysfunction with EF of 45%. No aortic stenosis.  . Mild aortic stenosis     not documented at catheterization; verified by echo in 2011  . Peripheral vascular disease     With a 70% innominate artery stenosis and nonobstructive carotid stenosis  . Hypothyroidism   . Degenerative joint disease     s/p bilateral TKR  . Mobitz (type) II atrioventricular block     With bradycardia; Medtronic pacemaker implanted in 09/2008  . Tobacco abuse, in remission     Remote  . GERD (gastroesophageal reflux disease)   . Hyperlipidemia     Lipid profile in 04/2010:115, 98, 43, 52.  . Cancer of larynx     laryngectomy in 1988; postoperative radiation therapy  . Weight loss     50 pounds  between 1991 and 2011  . 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 Adrenal hyperplasia     Stable on serial imaging  . Anemia     minimal in 2011 with hemoglobin of 12.2 and high normal MCV  . Borderline hypertension     Normal CMet in 2011    Past Surgical History  Procedure Date  . Laryngectomy 1988    S/P laryngectomy and radiation therapy  . Appendectomy 1973  . Knee arthroscopy     Left  . Total knee arthroplasty     Bilateral, 19 years ago  . Cataract extraction, bilateral   . Decompression facial nerve     Right median  . Pacemaker insertion     Family History  Problem Relation Age of Onset  . Stroke Mother   . Leukemia Father   . Colon cancer Neg Hx   . Liver disease Neg Hx   . GI problems Neg Hx   . Stroke Other   . Diabetes Other     History  Substance Use Topics  . Smoking status: Former Games developer  . Smokeless tobacco: Not on file   Comment: Quit 30 years  . Alcohol Use: No      Review of Systems  Cardiovascular: Negative for chest pain.  Gastrointestinal: Negative for abdominal pain and diarrhea.  Neurological: Positive for dizziness and weakness. Negative for numbness and headaches.  All other systems reviewed and are negative.    Allergies  Review of patient's allergies indicates no known allergies.  Home Medications   Current Outpatient Rx  Name Route Sig Dispense Refill  . ASPIRIN 81 MG PO TABS Oral Take 81 mg by mouth daily.      Marland Kitchen LEVOTHYROXINE SODIUM 125 MCG PO TABS Oral Take 125 mcg by mouth daily.      Marland Kitchen NITROGLYCERIN 0.4 MG SL SUBL Sublingual Place 1 tablet (0.4 mg total) under the tongue every 5 (five) minutes x 3 doses as needed for chest pain. 25 tablet 12  . SIMVASTATIN 40 MG PO TABS         BP 118/54  Pulse 60  Temp(Src) 97.9 F (36.6 C) (Oral)  Resp 18  Ht 6\' 2"  (1.88 m)  Wt 175 lb (79.379 kg)  BMI 22.47 kg/m2  SpO2 98%  Physical Exam CONSTITUTIONAL: Well developed/well  nourished HEAD AND FACE: Normocephalic/atraumatic EYES: EOMI/PERRL ENMT: Mucous membranes moist NECK: supple no meningeal signs, bruits bilaterally  Laryngectomy site noted, clean/dry, no erythema SPINE:entire spine nontender CV: MURMUR NOTED LUNGS: Lungs are clear to auscultation bilaterally, no apparent distress ABDOMEN: soft, nontender, no rebound or guarding GU:no cva tenderness NEURO: Awake/alert, facies symmetric, no arm or leg drift is noted Cranial nerves 3/4/5/6/03/16/09/11/12 tested and intact, unable to stand or walk, no pasT pointing  EXTREMITIES: pulses normal, full ROM SKIN: warm, color normal PSYCH: no abnormalities of mood noted   ED Course  Procedures  DIAGNOSTIC STUDIES: Oxygen Saturation is 98% on room air, normal by my interpretation.    LABS Results for orders placed during the hospital encounter of 09/15/11  CBC      Component Value Range   WBC 6.0  4.0 - 10.5 (K/uL)   RBC 3.34 (*) 4.22 - 5.81 (MIL/uL)   Hemoglobin 11.1 (*) 13.0 - 17.0 (g/dL)   HCT 40.9 (*) 81.1 - 52.0 (%)   MCV 94.9  78.0 - 100.0 (fL)   MCH 33.2  26.0 - 34.0 (pg)   MCHC 35.0  30.0 - 36.0 (g/dL)   RDW 91.4  78.2 - 95.6 (%)   Platelets 223  150 - 400 (K/uL)  DIFFERENTIAL      Component Value Range   Neutrophils Relative 60  43 - 77 (%)   Neutro Abs 3.6  1.7 - 7.7 (K/uL)   Lymphocytes Relative 20  12 - 46 (%)   Lymphs Abs 1.2  0.7 - 4.0 (K/uL)   Monocytes Relative 12  3 - 12 (%)   Monocytes Absolute 0.7  0.1 - 1.0 (K/uL)   Eosinophils Relative 8 (*) 0 - 5 (%)   Eosinophils Absolute 0.5  0.0 - 0.7 (K/uL)   Basophils Relative 1  0 - 1 (%)   Basophils Absolute 0.1  0.0 - 0.1 (K/uL)  BASIC METABOLIC PANEL      Component Value Range   Sodium 131 (*) 135 - 145 (mEq/L)   Potassium 4.6  3.5 - 5.1 (mEq/L)   Chloride 97  96 - 112 (mEq/L)   CO2 27  19 - 32 (mEq/L)   Glucose, Bld 86  70 - 99 (mg/dL)   BUN 15  6 - 23 (mg/dL)   Creatinine, Ser 2.13  0.50 - 1.35 (mg/dL)   Calcium 9.6  8.4  - 10.5 (mg/dL)   GFR calc non Af Amer 80 (*) >90 (mL/min)   GFR calc  Af Amer >90  >90 (mL/min)  URINALYSIS, ROUTINE W REFLEX MICROSCOPIC      Component Value Range   Color, Urine YELLOW  YELLOW    APPearance CLEAR  CLEAR    Specific Gravity, Urine 1.015  1.005 - 1.030    pH 6.0  5.0 - 8.0    Glucose, UA NEGATIVE  NEGATIVE (mg/dL)   Hgb urine dipstick NEGATIVE  NEGATIVE    Bilirubin Urine NEGATIVE  NEGATIVE    Ketones, ur NEGATIVE  NEGATIVE (mg/dL)   Protein, ur NEGATIVE  NEGATIVE (mg/dL)   Urobilinogen, UA 0.2  0.0 - 1.0 (mg/dL)   Nitrite NEGATIVE  NEGATIVE    Leukocytes, UA NEGATIVE  NEGATIVE    Radiology: CT Head Wo Contrast. Reviewed by me. IMPRESSION:  1. Remote infarcts include encephalomalacia along the parietal vertex bilaterally, and also in the rostrum of the right corpus callosum. The corpus callosal lesion is new compared the prior MRI from 2009. 2. Periventricular and corona radiata white matter hypodensities are most compatible with chronic ischemic microvascular white matter disease. 3. Chronic paranasal sinusitis. 4. No acute CVA, mass lesion, or acute intracranial hemorrhages observed.  Original Report Authenticated By: Dellia Cloud, M.D.    COORDINATION OF CARE: 10:20am:  - Patient evaluated by ED physician, EKG, CT Head, CBC, Diff, BMP ordered Unable to obtain MRI at this time due to his pacemaker (d/w radiology tech)  1:03 PM PT STILL WITH SYMPTOMS WILL GET ADMITTED D/W DR FAGAN, WILL ADMIT, OK FOR TEMP ORDERS tPA in stroke considered but not given due to:  Onset over 3-4.5hours    MDM  Nursing notes reviewed and considered in documentation All labs/vitals reviewed and considered Previous records reviewed and considered    Date: 09/15/2011  Rate: 60  Rhythm: electronically paced  QRS Axis: normal  Intervals: normal  ST/T Wave abnormalities: nonspecific ST changes  Conduction Disutrbances:left bundle branch block  Narrative  Interpretation:   Old EKG Reviewed: unchanged    I personally performed the services described in this documentation, which was scribed in my presence. The recorded information has been reviewed and considered.          Joya Gaskins, MD 09/15/11 [phone]

## 2011-09-16 NOTE — H&P (Signed)
Andre Holder, Andre Holder NO.:  [id number]  MEDICAL RECORD NO.:  [id number]  LOCATION:  A306                          FACILITY:  [facility]  PHYSICIAN:  Kingsley Callander. Ouida Sills, MD       DATE OF BIRTH:  [birth year]  DATE OF ADMISSION:  09/15/2011 DATE OF DISCHARGE:  LH                             HISTORY & PHYSICAL   CHIEF COMPLAINT:  Dizziness.  HISTORY OF PRESENT ILLNESS:  This patient is an 78 year old, white male, who presented to the emergency room with dizziness for 2 days.  He stated that he was unable to walk on his own.  He had not experienced any falls or loss of consciousness.  He had been able to walk short distances with assistance from his family at home.  He was evaluated in the emergency room with a CT scan of the brain, which revealed remote infarcts with encephalomalacia.  One lesion appeared new compared to an MRI in 2009.  There are also chronic small vessel ischemic changes noted, but no sign of acute stroke.  The patient has a history of laryngeal carcinoma, but has had no sign of recurrence.  He does not have a history of hypertension or AFib, although a pacemaker is in place.  PAST MEDICAL HISTORY: 1. Laryngeal carcinoma, treated with laryngectomy and radiation     therapy in 1988. 2. Status post pacemaker placement. 3. Peripheral neuropathy. 4. Hypothyroidism. 5. Appendectomy. 6. Chronic back pain. 7. Mild aortic stenosis. 8. Peripheral vascular disease with a history of a 70% innominate     artery stenosis and nonobstructive carotid stenosis. 9. Nonobstructive coronary artery disease by catheterizations in 2010.  MEDICATIONS: 1. Flomax 0.4 mg daily. 2. Levothyroxine 150 mcg daily. 3. Simvastatin 40 mg daily. 4. Sublingual nitroglycerin p.r.n. 5. Aspirin 81 mg daily.  ALLERGIES:  No known drug allergies.  FAMILY HISTORY:  His father died of leukemia at 81.  His mother had a stroke at 24. Sisters had diabetes.  SOCIAL HISTORY:  He has a remote  25-pack year history, but does not smoke now.  He does not use alcohol or drugs.  REVIEW OF SYSTEMS:  Noncontributory.  PHYSICAL EXAMINATION:  VITAL SIGNS:  Temperature 97.6, pulse 59, respirations 17, blood pressure 102/64, oxygen saturation 95% on room air. GENERAL:  Alert and in no acute distress. HEENT:  No scleral icterus.  Pharynx is unremarkable.  Ears unremarkable. Neck:  Status post laryngectomy.  No palpable mass. LUNGS:  Clear. HEART:  Regular with a grade 2 systolic murmur.  Pacemaker is in place. ABDOMEN:  Nontender.  No hepatosplenomegaly. EXTREMITIES:  No cyanosis, clubbing, or edema. NEURO:  Finger-to-nose testing is normal.  Strength in the upper extremities is normal, has mild diminished strength in the legs.  He is able to stand and bear weight.  He is able to take short steps.  No focal weakness. LYMPH NODES:  No cervical or supraclavicular enlargement.  LABORATORY DATA:  Sodium 131, potassium 4.6, bicarb 27, BUN 15, creatinine 0.7, calcium 9.6, glucose 86.  White count 6.0, hemoglobin 11.1, platelets 223,000.  Urinalysis is negative.  CT of the head as above.  IMPRESSION/PLAN: 1. Dizziness and gait disturbance.  She has no definite sign of recent     acute stroke.  He will be hospitalized since he is unable to report     to return home, since he has difficulty walking on his own.     Physical Therapy will be consulted. 2. Hypothyroidism.  Continue levothyroxine. 3. Peripheral neuropathy.  Previously treated with gabapentin, but     this was stopped by the patient citing no improvement. 4. History of chronic back pain.  Treated by Physiatry with multilevel     degenerative disk disease. 5. Normocytic anemia. 6. Nonobstructive coronary artery disease. 7. Peripheral vascular disease. 8. Benign prostatic hypertrophy.  Continue Flomax. 9. History of laryngeal carcinoma.     Kingsley Callander. Ouida Sills, MD     ROF/MEDQ  D:  09/16/2011  T:  09/16/2011  Job:   [id number]

## 2011-09-16 NOTE — Progress Notes (Signed)
CARE MANAGEMENT NOTE 09/16/2011  Patient:  Andre Holder, Andre Holder   Account Number:  [id number]  Date Initiated:  09/16/2011  Documentation initiated by:  Rosemary Holms  Subjective/Objective Assessment:   Pt admitted with weakness and unable to walk. Prior to admission lived at home with family     Action/Plan:   Pt and family wish to transfer to the [place]. Necessary paperwork will be completed.   Anticipated DC Date:  09/17/2011   Anticipated DC Plan:  ACUTE TO ACUTE TRANS      DC Planning Services  CM consult      Choice offered to / List presented to:             Status of service:   Medicare Important Message given?   (If response is "NO", the following Medicare IM given date fields will be blank) Date Medicare IM given:   Date Additional Medicare IM given:    Discharge Disposition:    Per UR Regulation:    Comments:  09/16/11 1730 Benyamin Jeff Leanord Hawking RN BSN

## 2011-09-16 NOTE — Progress Notes (Signed)
NAMESADIE, PICKAR NO.:  [id number]  MEDICAL RECORD NO.:  [id number]  LOCATION:  A306                          FACILITY:  [facility]  PHYSICIAN:  Kingsley Callander. Ouida Sills, MD       DATE OF BIRTH:  01-01-1923  DATE OF PROCEDURE: DATE OF DISCHARGE:                                PROGRESS NOTE   Mr. Adams dizziness is better today.  He has been able to walk to the bathroom with assistance.  He has some chronic lower extremity numbness due to neuropathy.  There is a note her that his family had wanted him transferred to the [place].  We discussed this and he prefers to stay here for now.  VITAL SIGNS:  Temperature 98.2, pulse 61, respirations 18, blood pressure 107/64, oxygen saturation is 96% on room air. LUNGS:  Clear. HEART:  Regular with a grade 2 systolic murmur. ABDOMEN:  Nontender. EXTREMITIES:  No edema. NEURO:  He is able to stand on his own and he is able to walk, but is unsteady.  IMPRESSION/PLAN: 1. Dizziness and gait disorder.  CT scan of the brain reveals old     small infarcts and chronic small vessel changes.  There is no sign     of acute stroke.  He cannot have an MRI because of his pacemaker.     Physical Therapy will be consulted. 2. Peripheral neuropathy.  Previously treated with gabapentin,     although he noted no improvement and stopped it. 3. Benign prostatic hypertrophy.  Recent evaluation revealed no sign     of urinary retention.  He has been on Flomax. 4. Hypothyroidism.  Continue levothyroxine. 5. History of laryngeal carcinoma.     Kingsley Callander. Ouida Sills, MD     ROF/MEDQ  D:  09/16/2011  T:  09/16/2011  Job:  [id number]

## 2011-09-16 NOTE — Progress Notes (Signed)
Physical Therapy Evaluation Patient Details Name: Andre Holder MRN: [id number] DOB: 1923-03-27 Today's Date: 09/16/2011 Time: 17:48-18:02 Charges: 1 EV  Problem List:  Patient Active Problem List  Diagnoses  . CANCER, LARYNX  . HYPOTHYROIDISM  . Hyperlipidemia  . ANEMIA  . AORTIC STENOSIS, MILD  . SECOND DEGREE AV BLOCK, MOBITZ II  . CEREBROVASCULAR DISEASE  . PERIPHERAL VASCULAR DISEASE  . GERD  . OSTEOARTHRITIS  . WEIGHT LOSS, ABNORMAL  . Tobacco abuse, in remission  . PACEMAKER, PERMANENT    Past Medical History:  Past Medical History  Diagnosis Date  . Arteriosclerotic cardiovascular disease (ASCVD)     Nonobstructive; 09/2008 50% proximal and 40% mid LAD; 25% circumflex; 30% RCA; mild global LV dysfunction with EF of 45%. No aortic stenosis.  . Mild aortic stenosis     not documented at catheterization; verified by echo in 2011  . Peripheral vascular disease     With a 70% innominate artery stenosis and nonobstructive carotid stenosis  . Hypothyroidism   . Degenerative joint disease     s/p bilateral TKR  . Mobitz (type) II atrioventricular block     With bradycardia; Medtronic pacemaker implanted in 09/2008  . Tobacco abuse, in remission     Remote  . GERD (gastroesophageal reflux disease)   . Hyperlipidemia     Lipid profile in 04/2010:115, 98, 43, 52.  . Cancer of larynx     laryngectomy in 1988; postoperative radiation therapy  . Weight loss     50 pounds between 1991 and 2011  . 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 Adrenal hyperplasia     Stable on serial imaging  . Anemia     minimal in 2011 with hemoglobin of 12.2 and high normal MCV  . Borderline hypertension     Normal CMet in 2011   Past Surgical History:  Past Surgical History  Procedure Date  . Laryngectomy 1988    S/P laryngectomy and radiation therapy  . Appendectomy 1973  . Knee arthroscopy     Left  . Total knee arthroplasty    Bilateral, 19 years ago  . Cataract extraction, bilateral   . Decompression facial nerve     Right median  . Pacemaker insertion     PT Assessment/Plan/Recommendation PT Assessment Clinical Impression Statement: 78 y.o. male admitted to [facility] for dizziness and vertigo.  He presents with decreased activity tolerance, decreased lower extremity muscle strength, decreased endurance and decreased balance.  He would benefit from skilled PT to maximize independence, functional mobility and safety so that he may eventually be able to return home with his wife's supervision.   PT Recommendation/Assessment: Patient will need skilled PT in the acute care venue PT Problem List: Decreased strength;Decreased activity tolerance;Decreased balance PT Therapy Diagnosis : Difficulty walking;Abnormality of gait;Generalized weakness PT Plan PT Frequency: Min 3X/week PT Treatment/Interventions: DME instruction;Gait training;Stair training;Functional mobility training;Therapeutic activities;Therapeutic exercise;Neuromuscular re-education;Balance training;Patient/family education PT Recommendation Follow Up Recommendations: Home health PT;Supervision/Assistance - 24 hour (vs SNF for rehab- patient mentioned getting rehab at the [place], but I am not sure if he ment inpatient rehab or SNF rehab or just therapy while he is staying there acutely since he seems to indicate that he wants to transfer there.) Equipment Recommended:  (may need a RW depends on progress) PT Goals  Acute Rehab PT Goals PT Goal Formulation: With patient Time For Goal Achievement: 2 weeks Pt will go Supine/Side to Sit:  Independently;with HOB 0 degrees PT Goal: Supine/Side to Sit - Progress: Not met Pt will go Sit to Supine/Side: Independently;with HOB 0 degrees PT Goal: Sit to Supine/Side - Progress: Not met Pt will go Sit to Stand: with modified independence PT Goal: Sit to Stand - Progress: Not met Pt will go Stand to Sit: with modified  independence PT Goal: Stand to Sit - Progress: Not met Pt will Transfer Bed to Chair/Chair to Bed: with modified independence PT Transfer Goal: Bed to Chair/Chair to Bed - Progress: Not met Pt will Ambulate: >150 feet;with modified independence;with least restrictive assistive device PT Goal: Ambulate - Progress: Not met Pt will Go Up / Down Stairs: 3-5 stairs;with modified independence;with least restrictive assistive device PT Goal: Up/Down Stairs - Progress: Not met  PT Evaluation Precautions/Restrictions  Precautions Precautions: Fall Precaution Comments: also has a voice speaking device since he no longer has a larynx secondary to CA.   Prior Functioning  Home Living Lives With: Spouse Receives Help From: Family (wife and son in law) Home Adaptive Equipment: Straight cane Prior Function Level of Independence: Independent with basic ADLs;Independent with homemaking with ambulation;Independent with gait;Independent with transfers (started using a cane recently due to worsening back pain.  ) Sensation/Coordination Sensation Light Touch: Impaired Detail Light Touch Impaired Details: Impaired RLE;Impaired LLE (burning in bil lower legs, ? peripherial neuropathy.  ) Extremity Assessment RLE Assessment RLE Assessment: Exceptions to [facility] RLE Strength RLE Overall Strength: Deficits RLE Overall Strength Comments: grossly at least 3/5 per functional assessment.   LLE Assessment LLE Assessment: Exceptions to [facility] LLE Strength LLE Overall Strength: Deficits LLE Overall Strength Comments: grossly at least 3/5 per functional assessment.  No obvious asymmetries noted R vs. L leg.   Mobility (including Balance) Bed Mobility Bed Mobility: Yes Supine to Sit: 6: Modified independent (Device/Increase time);HOB elevated (Comment degrees);With rails (HOB 45 degrees) Sitting - Scoot to Edge of Bed: 6: Modified independent (Device/Increase time) Transfers Transfers: Yes Sit to Stand: 5:  Supervision Sit to Stand Details (indicate cue type and reason): patient relying heavily on arms to get to standing due to reports of stiffness from being in the bed.  No reports of dizziness.   Stand to Sit: 5: Supervision Stand to Sit Details: uses hands to control descent to sit.   Ambulation/Gait Ambulation/Gait: Yes Ambulation/Gait Assistance: 4: Min assist Ambulation/Gait Assistance Details (indicate cue type and reason): min assist to help steady patient while walking for balance.   Ambulation Distance (Feet): 180 Feet Assistive device: Straight cane Gait Pattern: Shuffle;Trunk flexed    End of Session PT - End of Session Equipment Utilized During Treatment:  (straight cane) Activity Tolerance: Patient limited by fatigue Patient left: in bed;with bed alarm set General Behavior During Session: [facility] for tasks performed Cognition: [facility] for tasks performed (not specifically tested)  Lurena Joiner B. Henritta Mutz, PT, DPT  09/16/2011, 6:19 PM

## 2011-09-17 NOTE — Discharge Summary (Signed)
NAMENORLAN, RANN NO.:  [id number]  MEDICAL RECORD NO.:  [id number]  LOCATION:  A306                          FACILITY:  [facility]  PHYSICIAN:  Kingsley Callander. Ouida Sills, MD       DATE OF BIRTH:  10-03-1922  DATE OF ADMISSION:  09/15/2011 DATE OF DISCHARGE:  LH                              DISCHARGE SUMMARY   DISCHARGE DIAGNOSES: 1. Dizziness. 2. Gait disorder. 3. Peripheral neuropathy. 4. Carotid artery disease. 5. Nonobstructive coronary artery disease. 6. Hypothyroidism. 7. Laryngeal carcinoma. 8. Hyperlipidemia. 9. Osteoarthritis. 10.Status post pacemaker placement. 11.History of old stroke.  HOSPITAL COURSE:  This patient is an 78 year old white male, who presented with 2-day history of dizziness and difficulty walking.  He underwent a CT scan of the brain which revealed small old infarcts and small vessel changes.  He has been treated with aspirin and simvastatin. He was evaluated and treated with physical therapy.  His dizziness has resolved.  He and his family have requested transfer to the [place] and these are arrangements are in process.  On the morning of September 17, 2011, his dizziness has resolved.  He is unsteady with gait and uses a cane.  He has a history of carotid artery disease followed by Vascular Surgery, has history of nonobstructive coronary artery disease.  He has a pacemaker in place and has routine Cardiology followup.  Hypothyroidism has been treated with levothyroxine.  He has had a recent dosage adjustment of his levothyroxine to 150 mcg.  He has had no evidence of recurrent laryngeal carcinoma.  He has had extensive degenerative disk disease in his lumbar spine, hence previously been evaluated and treated by Physiatry.  He is not requiring ongoing treatment with pain medication at this point.  His BPH has been stable on tamsulosin.  His condition on September 17, 2011, is improved.  He is stable for transfer to the [place].  DISCHARGE  MEDICATIONS: 1. Aspirin 81 mg daily. 2. Simvastatin 40 mg at bedtime. 3. Levothyroxine 150 mcg q.a.m. 4. Tamsulosin 0.4 mg daily. 5. Alprazolam 0.25 mg at bedtime p.r.n.     Kingsley Callander. Ouida Sills, MD     ROF/MEDQ  D:  09/17/2011  T:  09/17/2011  Job:  [id number]

## 2011-09-17 NOTE — Progress Notes (Signed)
Physical Therapy Treatment Patient Details Name: Andre Holder MRN: [id number] DOB: January 02, 1923 Today's Date: 09/17/2011 Time: 782-956 Charges: Therex x 10' Gait x 10' PT Assessment/Plan  PT - Assessment/Plan Comments on Treatment Session: Pt with increased steadiness with gait this session. Pt somewhat fatigued after gait. Pt reports back 1-2/10 back pain after gait. He reports that his back bothers him some when he stands. Pt denies need for medication. Pt completes therex with minimal difficulty. PT Goals  Acute Rehab PT Goals PT Goal Formulation: With patient Time For Goal Achievement: 2 weeks Pt will go Supine/Side to Sit: Independently;with HOB 0 degrees PT Goal: Supine/Side to Sit - Progress: Progressing toward goal Pt will go Sit to Supine/Side: Independently;with HOB 0 degrees PT Goal: Sit to Supine/Side - Progress: Progressing toward goal Pt will go Sit to Stand: with modified independence PT Goal: Sit to Stand - Progress: Progressing toward goal Pt will go Stand to Sit: with modified independence PT Goal: Stand to Sit - Progress: Progressing toward goal Pt will Transfer Bed to Chair/Chair to Bed: with modified independence Pt will Ambulate: >150 feet;with modified independence;with least restrictive assistive device PT Goal: Ambulate - Progress: Progressing toward goal Pt will Go Up / Down Stairs: 3-5 stairs;with modified independence;with least restrictive assistive device PT Goal: Up/Down Stairs - Progress: Progressing toward goal  PT Treatment Precautions/Restrictions  Precautions Precautions: Fall Precaution Comments: also has a voice speaking device since he no longer has a larynx secondary to CA.   Restrictions Weight Bearing Restrictions: No Mobility (including Balance) Bed Mobility Sitting - Scoot to Edge of Bed: 6: Modified independent (Device/Increase time) Transfers Transfers: Yes Sit to Stand: 5: Supervision Stand to Sit: 5: Supervision Stand to Sit  Details: Pt uncontrolled descent. Pt tends to "plop". Ambulation/Gait Ambulation/Gait Assistance: 5: Supervision Ambulation/Gait Assistance Details (indicate cue type and reason): Pt with increased steadiness this session. Pt with out LOB but does tend to weave occasionally when walking. Pt able to self correct with out help of therapist. Ambulation Distance (Feet): 224 Feet Assistive device: Straight cane Gait Pattern: Trunk flexed;Decreased stride length Stairs: No Wheelchair Mobility Wheelchair Mobility: No    Exercise  General Exercises - Lower Extremity Long Arc Quad: 10 reps;Both;Seated Hip Flexion/Marching: 10 reps;Both;Seated Heel Raises: 10 reps;Both;Standing Mini-Sqauts: 10 reps;Standing End of Session PT - End of Session Equipment Utilized During Treatment: Gait belt;Other (comment) [facility]) Activity Tolerance: Patient tolerated treatment well Patient left: in bed;with bed alarm set;with family/visitor present General Behavior During Session: [facility] for tasks performed Cognition: [facility] for tasks performed  Antonieta Iba 09/17/2011, 8:40 AM

## 2011-09-17 NOTE — Progress Notes (Signed)
NAMEGREGORY, BARRICK NO.:  [id number]  MEDICAL RECORD NO.:  [id number]  LOCATION:  A306                          FACILITY:  [facility]  PHYSICIAN:  Kingsley Callander. Ouida Sills, MD       DATE OF BIRTH:  08-22-1923  DATE OF PROCEDURE: DATE OF DISCHARGE:                                PROGRESS NOTE   Mr. Sheeran denies any dizziness this morning.  He has been able to stand and urinate.  He has walked short distances.  He worked with physical therapy yesterday afternoon.  Arrangements are in progress for transfer to the [place].  He and his family have requested to transfer there.  PHYSICAL EXAMINATION:  VITAL SIGNS:  He is afebrile with a temperature of 98, pulse is 63 and regular, respirations 20, blood pressure 107/78, oxygen saturation is 92% on room air. LUNGS:  Clear. HEART:  Regular with a grade 2 systolic murmur. ABDOMEN:  Soft, and nontender. NEUROLOGIC:  Stable.  IMPRESSION AND PLAN: 1. Gait disorder and peripheral neuropathy.  Continue physical     therapy, referred to the [place]. 2. History of remote strokes.  Continue aspirin and simvastatin. 3. Carotid artery disease.  His last carotid ultrasound was in     September.  He had a 70% stenosis in the left common carotid artery     and 50-59% stenosis in the right carotid bifurcation.  He has had     consultation with Vascular Surgery for ongoing followup. 4. Hypothyroidism.  Continue levothyroxine. 5. Transferred to [place] when bed is available.     Kingsley Callander. Ouida Sills, MD     ROF/MEDQ  D:  09/17/2011  T:  09/17/2011  Job:  [id number]

## 2011-09-18 MED ORDER — ASPIRIN 81 MG PO TABS: 81.0000 mg | ORAL_TABLET | Freq: Every day | ORAL | Status: DC

## 2011-09-18 MED ORDER — TAMSULOSIN HCL 0.4 MG PO CAPS: 0.4000 mg | ORAL_CAPSULE | Freq: Every day | ORAL | Status: DC

## 2011-09-18 MED ORDER — LEVOTHYROXINE SODIUM 150 MCG PO TABS: 150.0000 ug | ORAL_TABLET | Freq: Every day | ORAL | Status: DC

## 2011-09-18 MED ORDER — ALUM & MAG HYDROXIDE-SIMETH 200-200-20 MG/5ML PO SUSP: 30.0000 mL | ORAL | Status: DC | PRN

## 2011-09-18 NOTE — Plan of Care (Signed)
Problem: Phase I Progression Outcomes Goal: Pain controlled with appropriate interventions Outcome: Progressing Not complaining of pain on this shift.

## 2011-09-18 NOTE — Discharge Summary (Signed)
NAMEGILLIAM, HAWKES NO.:  [id number]  MEDICAL RECORD NO.:  [id number]  LOCATION:  A306                          FACILITY:  [facility]  PHYSICIAN:  Kingsley Callander. Ouida Sills, MD       DATE OF BIRTH:  14-Jun-1923  DATE OF ADMISSION:  09/15/2011 DATE OF DISCHARGE:  01/10/2013LH                              DISCHARGE SUMMARY   ADDENDUM  Mr. Coolman will now be discharged to home with followup at the [facility] tomorrow and with me in 2 weeks.  His dizziness has improved.  He has been able to walk with the therapist. He has not failed to meet admission criteria.  At this point, he can be changed to observation status.  He will continue his current medication regimen including aspirin, and we will have followup arrangements for is carotid disease with Vascular Surgery.     Kingsley Callander. Ouida Sills, MD     ROF/MEDQ  D:  09/18/2011  T:  09/18/2011  Job:  [id number]

## 2011-09-18 NOTE — Progress Notes (Signed)
NAMEJARRIEL, Andre Holder NO.:  [id number]  MEDICAL RECORD NO.:  [id number]  LOCATION:  A306                          FACILITY:  [facility]  PHYSICIAN:  Kingsley Callander. Ouida Sills, MD       DATE OF BIRTH:  January 04, 1923  DATE OF PROCEDURE: DATE OF DISCHARGE:                                PROGRESS NOTE   [name] complains of mild dizziness this morning.  He has worked with physical therapist.  I am told that a bed has been arranged at the [place] and that the [place] will come to pick him up.  He is afebrile with temperature 97.9, pulse 59, respirations 20, blood pressure 100/65.  Neurologic status is unchanged.  Lungs clear.  Heart regular with a grade 2 systolic murmur.  Extremities reveal no edema.  IMPRESSION/PLAN: 1. Dizziness and gait disorder.  Transferred to the [place] is pending     based on bed availability.  He will continue with physical therapy     here for now. 2. Carotid artery disease.  Continue aspirin and simvastatin.  He is     unclear on his last Vascular Surgery followup. 3. Peripheral neuropathy. 4. Status post pacemaker placement, stable.     Kingsley Callander. Ouida Sills, MD     ROF/MEDQ  D:  09/18/2011  T:  09/18/2011  Job:  [id number]

## 2011-09-18 NOTE — Progress Notes (Signed)
Patient d/c home with wife Left floor via wheelchair accompanied by staff No c/o pain at d/c Verbalized understanding of d/c instructions, RX's and follow up appt. Kiaria Quinnell [employer]

## 2011-09-19 ENCOUNTER — Ambulatory Visit: Payer: Medicare Other | Admitting: Internal Medicine

## 2011-10-08 ENCOUNTER — Other Ambulatory Visit: Payer: Self-pay

## 2011-10-08 ENCOUNTER — Encounter (HOSPITAL_COMMUNITY): Payer: Self-pay | Admitting: *Deleted

## 2011-10-08 ENCOUNTER — Emergency Department (HOSPITAL_COMMUNITY): Payer: Medicare HMO

## 2011-10-08 ENCOUNTER — Inpatient Hospital Stay (HOSPITAL_COMMUNITY)
Admission: EM | Admit: 2011-10-08 | Discharge: 2011-10-10 | DRG: 312 | Disposition: A | Discharge status: Home / Self Care | Payer: Medicare HMO | Attending: Internal Medicine | Admitting: Internal Medicine

## 2011-10-08 DIAGNOSIS — I10 Essential (primary) hypertension: Secondary | ICD-10-CM | POA: Diagnosis present

## 2011-10-08 DIAGNOSIS — D638 Anemia in other chronic diseases classified elsewhere: Secondary | ICD-10-CM | POA: Diagnosis present

## 2011-10-08 DIAGNOSIS — Z8521 Personal history of malignant neoplasm of larynx: Secondary | ICD-10-CM

## 2011-10-08 DIAGNOSIS — IMO0002 Reserved for concepts with insufficient information to code with codable children: Secondary | ICD-10-CM

## 2011-10-08 DIAGNOSIS — I739 Peripheral vascular disease, unspecified: Secondary | ICD-10-CM | POA: Diagnosis present

## 2011-10-08 DIAGNOSIS — I2510 Atherosclerotic heart disease of native coronary artery without angina pectoris: Secondary | ICD-10-CM | POA: Diagnosis present

## 2011-10-08 DIAGNOSIS — Z950 Presence of cardiac pacemaker: Secondary | ICD-10-CM | POA: Diagnosis present

## 2011-10-08 DIAGNOSIS — E871 Hypo-osmolality and hyponatremia: Secondary | ICD-10-CM | POA: Diagnosis present

## 2011-10-08 DIAGNOSIS — I6529 Occlusion and stenosis of unspecified carotid artery: Secondary | ICD-10-CM | POA: Diagnosis present

## 2011-10-08 DIAGNOSIS — E278 Other specified disorders of adrenal gland: Secondary | ICD-10-CM | POA: Diagnosis present

## 2011-10-08 DIAGNOSIS — I359 Nonrheumatic aortic valve disorder, unspecified: Secondary | ICD-10-CM | POA: Diagnosis present

## 2011-10-08 DIAGNOSIS — R55 Syncope and collapse: Secondary | ICD-10-CM | POA: Diagnosis present

## 2011-10-08 DIAGNOSIS — K219 Gastro-esophageal reflux disease without esophagitis: Secondary | ICD-10-CM | POA: Diagnosis present

## 2011-10-08 DIAGNOSIS — Z79899 Other long term (current) drug therapy: Secondary | ICD-10-CM

## 2011-10-08 DIAGNOSIS — M1990 Unspecified osteoarthritis, unspecified site: Secondary | ICD-10-CM | POA: Diagnosis present

## 2011-10-08 DIAGNOSIS — E039 Hypothyroidism, unspecified: Secondary | ICD-10-CM | POA: Diagnosis present

## 2011-10-08 DIAGNOSIS — Z8673 Personal history of transient ischemic attack (TIA), and cerebral infarction without residual deficits: Secondary | ICD-10-CM

## 2011-10-08 DIAGNOSIS — Z87891 Personal history of nicotine dependence: Secondary | ICD-10-CM

## 2011-10-08 DIAGNOSIS — Z96659 Presence of unspecified artificial knee joint: Secondary | ICD-10-CM

## 2011-10-08 DIAGNOSIS — I441 Atrioventricular block, second degree: Secondary | ICD-10-CM | POA: Diagnosis not present

## 2011-10-08 DIAGNOSIS — Z7982 Long term (current) use of aspirin: Secondary | ICD-10-CM

## 2011-10-08 DIAGNOSIS — Z9849 Cataract extraction status, unspecified eye: Secondary | ICD-10-CM

## 2011-10-08 LAB — CARDIAC PANEL(CRET KIN+CKTOT+MB+TROPI)
Relative Index: INVALID (ref 0.0–2.5)
Troponin I: <0.3 ng/mL (ref <0.30)

## 2011-10-08 LAB — DIFFERENTIAL
Lymphocytes Relative: 22 % (ref 12–46)
Lymphs Abs: 1.4 10*3/uL (ref 0.7–4.0)
Monocytes Relative: 12 % (ref 3–12)
Neutro Abs: 3.5 10*3/uL (ref 1.7–7.7)
Neutrophils Relative %: 58 % (ref 43–77)

## 2011-10-08 LAB — URINALYSIS, ROUTINE W REFLEX MICROSCOPIC
Nitrite: NEGATIVE
Protein, ur: NEGATIVE mg/dL
Specific Gravity, Urine: 1.015 (ref 1.005–1.030)
Urobilinogen, UA: 0.2 mg/dL (ref 0.0–1.0)

## 2011-10-08 LAB — BASIC METABOLIC PANEL
BUN: 17 mg/dL (ref 6–23)
CO2: 25 mEq/L (ref 19–32)
Chloride: 93 mEq/L — ABNORMAL LOW (ref 96–112)
Glucose, Bld: 102 mg/dL — ABNORMAL HIGH (ref 70–99)
Potassium: 3.8 mEq/L (ref 3.5–5.1)

## 2011-10-08 LAB — CBC
Hemoglobin: 10.7 g/dL — ABNORMAL LOW (ref 13.0–17.0)
MCH: 32.6 pg (ref 26.0–34.0)
RBC: 3.28 MIL/uL — ABNORMAL LOW (ref 4.22–5.81)

## 2011-10-08 MED ORDER — SODIUM CHLORIDE 0.9 % IV SOLN
INTRAVENOUS | Status: DC
  Administered 2011-10-08: 23:00:00 via INTRAVENOUS

## 2011-10-08 MED ORDER — SODIUM CHLORIDE 0.9 % IV BOLUS (SEPSIS)
500.0000 mL | Freq: Once | INTRAVENOUS | Status: AC
  Administered 2011-10-08: 500 mL via INTRAVENOUS

## 2011-10-08 NOTE — ED Notes (Signed)
Lab at bedside to draw blood.

## 2011-10-08 NOTE — ED Notes (Signed)
Patient transported to CT via stretcher.

## 2011-10-08 NOTE — ED Provider Notes (Signed)
History   This chart was scribed for Donnetta Hutching, MD by Melba Coon. The patient was seen in room APA02/AP[facility]02 and the patient's care was started at 10:15PM.    CSN: [id number]  Arrival date & time 10/08/11  2134   First MD Initiated Contact with Patient 10/08/11 2153      Chief Complaint  Patient presents with  . Near Syncope    (Consider location/radiation/quality/duration/timing/severity/associated sxs/prior treatment) HPI Andre Holder is a 78 y.o. male who presents to the Emergency Department complaining of an moderate to severe episode of syncope with an onset an hour ago. Pt's wife states that pt was sitting in a chair checking his wife's and his own BP when he just leaned over and experienced LOC. Pt was falling out of the chair and pt's wife could not hold him up. Pt was out of consciousness for 5-6 min. Pt is lightheaded and diaphoretic. 2 years ago, pt was in the hospital and a Code Blue was called for him. Pt was also recently admitted 2 weeks ago because he became non-ambulatory. Pt had CA and a laryngectomy, so pt talks with a voice box machine. Pt also has a pacemaker.  PCP: Dr. Ouida Sills Cardiologists: Corinda Gubler and [facility]     Past Medical History  Diagnosis Date  . Arteriosclerotic cardiovascular disease (ASCVD)     Nonobstructive; 09/2008 50% proximal and 40% mid LAD; 25% circumflex; 30% RCA; mild global LV dysfunction with EF of 45%. No aortic stenosis.  . Mild aortic stenosis     not documented at catheterization; verified by echo in 2011  . Peripheral vascular disease     With a 70% innominate artery stenosis and nonobstructive carotid stenosis  . Hypothyroidism   . Degenerative joint disease     s/p bilateral TKR  . Mobitz (type) II atrioventricular block     With bradycardia; Medtronic pacemaker implanted in 09/2008  . Tobacco abuse, in remission     Remote  . GERD (gastroesophageal reflux disease)   . Hyperlipidemia     Lipid profile in 04/2010:115,  98, 43, 52.  . Cancer of larynx     laryngectomy in 1988; postoperative radiation therapy  . Weight loss     50 pounds between 1991 and 2011  . 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 Adrenal hyperplasia     Stable on serial imaging  . Anemia     minimal in 2011 with hemoglobin of 12.2 and high normal MCV  . Borderline hypertension     Normal CMet in 2011    Past Surgical History  Procedure Date  . Laryngectomy 1988    S/P laryngectomy and radiation therapy  . Appendectomy 1973  . Knee arthroscopy     Left  . Total knee arthroplasty     Bilateral, 19 years ago  . Cataract extraction, bilateral   . Decompression facial nerve     Right median  . Pacemaker insertion     Family History  Problem Relation Age of Onset  . Stroke Mother   . Leukemia Father   . Colon cancer Neg Hx   . Liver disease Neg Hx   . GI problems Neg Hx   . Stroke Other   . Diabetes Other     History  Substance Use Topics  . Smoking status: Former Games developer  . Smokeless tobacco: Former Neurosurgeon   Comment: Quit 30 years  . Alcohol Use:  No      Review of Systems 10 Systems reviewed and are negative for acute change except as noted in the HPI.  Allergies  Review of patient's allergies indicates no known allergies.  Home Medications   Current Outpatient Rx  Name Route Sig Dispense Refill  . ASPIRIN 81 MG PO TABS Oral Take 1 tablet (81 mg total) by mouth daily. 30 tablet 12  . IBUPROFEN 600 MG PO TABS Oral Take 600 mg by mouth every 6 (six) hours as needed. For pain    . LEVOTHYROXINE SODIUM 150 MCG PO TABS Oral Take 1 tablet (150 mcg total) by mouth daily before breakfast. 30 tablet 12  . NAPROXEN 375 MG PO TABS Oral Take 375 mg by mouth 2 (two) times daily with a meal. For pain    . FISH OIL 1200 MG PO CAPS Oral Take 1 capsule by mouth daily.    Marland Kitchen OMEPRAZOLE 20 MG PO CPDR Oral Take 20 mg by mouth daily. 30 minutes before meals    . OVER THE COUNTER  MEDICATION Oral Take 1 capsule by mouth 2 (two) times daily. SUPER BETA PROSTATE:    . SIMVASTATIN 40 MG PO TABS Oral Take 40 mg by mouth at bedtime.     . TAMSULOSIN HCL 0.4 MG PO CAPS Oral Take 1 capsule (0.4 mg total) by mouth daily. 30 capsule 12  . NITROGLYCERIN 0.4 MG SL SUBL Sublingual Place 1 tablet (0.4 mg total) under the tongue every 5 (five) minutes x 3 doses as needed for chest pain. 25 tablet 12    BP 146/57  Pulse 60  Temp(Src) 97.7 F (36.5 C) (Oral)  Resp 16  Ht 6\' 2"  (1.88 m)  Wt 180 lb (81.647 kg)  BMI 23.11 kg/m2  SpO2 97%  Physical Exam  Constitutional: He is oriented to person, place, and time. He appears well-developed and well-nourished.  HENT:  Head: Normocephalic and atraumatic.  Right Ear: External ear normal.  Left Ear: External ear normal.  Eyes: Conjunctivae and EOM are normal. Pupils are equal, round, and reactive to light. No scleral icterus.  Neck: Normal range of motion. Neck supple. No thyromegaly present.  Cardiovascular: Normal rate, regular rhythm and normal heart sounds.  Exam reveals no gallop and no friction rub.   No murmur heard. Pulmonary/Chest: Effort normal and breath sounds normal. No stridor. He has no wheezes. He has no rales. He exhibits no tenderness.  Abdominal: Soft. Bowel sounds are normal. He exhibits no distension. There is no tenderness. There is no rebound.  Musculoskeletal: Normal range of motion. He exhibits no edema.  Lymphadenopathy:    He has no cervical adenopathy.  Neurological: He is alert and oriented to person, place, and time. He displays normal reflexes. No cranial nerve deficit. He exhibits normal muscle tone. Coordination normal.  Skin: No rash noted. No erythema.  Psychiatric: He has a normal mood and affect. His behavior is normal.    ED Course  Procedures (including critical care time)  DIAGNOSTIC STUDIES: Oxygen Saturation is 97% on room air, normal by my interpretation.    COORDINATION OF  CARE:  Results for orders placed during the hospital encounter of 10/08/11  CBC      Component Value Range   WBC 6.1  4.0 - 10.5 (K/uL)   RBC 3.28 (*) 4.22 - 5.81 (MIL/uL)   Hemoglobin 10.7 (*) 13.0 - 17.0 (g/dL)   HCT 16.1 (*) 09.6 - 52.0 (%)   MCV 92.1  78.0 - 100.0 (  fL)   MCH 32.6  26.0 - 34.0 (pg)   MCHC 35.4  30.0 - 36.0 (g/dL)   RDW 40.9  81.1 - 91.4 (%)   Platelets 283  150 - 400 (K/uL)  DIFFERENTIAL      Component Value Range   Neutrophils Relative 58  43 - 77 (%)   Neutro Abs 3.5  1.7 - 7.7 (K/uL)   Lymphocytes Relative 22  12 - 46 (%)   Lymphs Abs 1.4  0.7 - 4.0 (K/uL)   Monocytes Relative 12  3 - 12 (%)   Monocytes Absolute 0.7  0.1 - 1.0 (K/uL)   Eosinophils Relative 7 (*) 0 - 5 (%)   Eosinophils Absolute 0.5  0.0 - 0.7 (K/uL)   Basophils Relative 1  0 - 1 (%)   Basophils Absolute 0.0  0.0 - 0.1 (K/uL)  URINALYSIS, ROUTINE W REFLEX MICROSCOPIC      Component Value Range   Color, Urine YELLOW  YELLOW    APPearance CLEAR  CLEAR    Specific Gravity, Urine 1.015  1.005 - 1.030    pH 6.0  5.0 - 8.0    Glucose, UA NEGATIVE  NEGATIVE (mg/dL)   Hgb urine dipstick NEGATIVE  NEGATIVE    Bilirubin Urine NEGATIVE  NEGATIVE    Ketones, ur NEGATIVE  NEGATIVE (mg/dL)   Protein, ur NEGATIVE  NEGATIVE (mg/dL)   Urobilinogen, UA 0.2  0.0 - 1.0 (mg/dL)   Nitrite NEGATIVE  NEGATIVE    Leukocytes, UA NEGATIVE  NEGATIVE        Dg Chest Port 1 View  10/08/2011  *RADIOLOGY REPORT*  Clinical Data: Near-syncopal episode.  Weakness, diaphoresis, and lightheadedness.  PORTABLE CHEST - 1 VIEW  Comparison: 07/15/2011  Findings: Shallow inspiration with elevation of the left hemidiaphragm.  Cardiac enlargement with normal appearing pulmonary vascularity.  Fibrosis or atelectasis in the lung bases.  No focal airspace consolidation.  Tortuous and calcified aorta.  Cardiac pacemaker with stable appearance.  IMPRESSION: Shallow inspiration with elevation of left hemidiaphragm and atelectasis  or fibrosis in the lung bases.  Cardiac enlargement. No focal consolidation.  Original Report Authenticated By: Marlon Pel, M.D.    Date: 10/08/2011  Rate: 60  Rhythm: pacemaker    QRS Axis: normal  Intervals: QT prolonged  ST/T Wave abnormalities: normal  Conduction Disutrbances:nonspecific intraventricular conduction delay  Narrative Interpretation:   Old EKG Reviewed: changes noted  No diagnosis found. 10/08/2011    MDM  Patient had prolonged syncopal spell. EKG shows paced rhythm. He is neurologically normal. CT scan of head pending. Discussed with hospitalist   I personally performed the services described in this documentation, which was scribed in my presence. The recorded information has been reviewed and considered.        Donnetta Hutching, MD 10/08/11 [phone]

## 2011-10-08 NOTE — ED Notes (Signed)
Patient back to room from CT. No distress. Denies pain. Equal chest rise and fall. Normal saline infusing with no signs of infiltration. Paced rhythm on monitor. Call bell within reach. Family with patient. Bed in low position and locked with side rails up. A&O x 4. No synopal episodes since arrival.

## 2011-10-08 NOTE — ED Notes (Signed)
MD at bedside to evaluate.

## 2011-10-08 NOTE — ED Notes (Signed)
Patient resting sitting up in bed. No distress. Denies needs. Radiology at bedside for portable chest. Call bell within reach.

## 2011-10-08 NOTE — ED Notes (Addendum)
Per EMS, patient's family states he became diaphoretic and light headed. Laid patient down to rest. Did not lose consciousness. A&O x 4 at this time. EMS orthostatics: laying 90\62 pulse 63, sitting 116\64 pulse 60. Skin warm, dry, normal color upon arrival.

## 2011-10-08 NOTE — ED Notes (Signed)
Given two warm blankets per request. Denies any needs. No distress. Equal chest rise and fall, regular, unlabored. Call bell and family at bedside.

## 2011-10-08 NOTE — ED Notes (Signed)
Patient attempting to give urine specimen via clean catch at this time. In no distress. Equal chest rise and fall. Call bell within reach. Denies needs. Family at bedside.

## 2011-10-08 NOTE — ED Notes (Addendum)
Assessing patient at this time. Patient is A&O x 4. Skin warm, dry, normal color. Left pupil reactive, 2 mm, PERRLA. Right pupil nonreactive and misshaped. States he had bilateral eye surgery. Denies visual disturbances. Heart sounds regular, strong. S1 and S2 present; no extra heart sounds. Brisk cap refill and strong radial pulses. Clear lung sounds in all fields. Denies nausea, vomiting, diarrhea. Bowel sounds active in all quadrants. Abdomen soft and no tenderness. Last food\ fluid intake at 1800; tolerated well. Last BM this morning and normal. Placed on CCM; paced rhythm. States light headedness is better. Denies any weakness. States he has been feeling well all day up until this episode. Denies needs. Bed in low position and locked with side rails up. Awaiting MD eval.

## 2011-10-09 ENCOUNTER — Encounter (HOSPITAL_COMMUNITY): Payer: Self-pay

## 2011-10-09 DIAGNOSIS — R55 Syncope and collapse: Secondary | ICD-10-CM | POA: Diagnosis present

## 2011-10-09 DIAGNOSIS — I359 Nonrheumatic aortic valve disorder, unspecified: Secondary | ICD-10-CM

## 2011-10-09 DIAGNOSIS — E871 Hypo-osmolality and hyponatremia: Secondary | ICD-10-CM | POA: Diagnosis present

## 2011-10-09 LAB — CBC
MCH: 32.8 pg (ref 26.0–34.0)
Platelets: 265 10*3/uL (ref 150–400)
RBC: 3.17 MIL/uL — ABNORMAL LOW (ref 4.22–5.81)
WBC: 6.8 10*3/uL (ref 4.0–10.5)

## 2011-10-09 LAB — BASIC METABOLIC PANEL
BUN: 15 mg/dL (ref 6–23)
CO2: 26 mEq/L (ref 19–32)
Calcium: 8.8 mg/dL (ref 8.4–10.5)
Chloride: 97 mEq/L (ref 96–112)
Creatinine, Ser: 0.67 mg/dL (ref 0.50–1.35)
GFR calc Af Amer: >90 mL/min (ref >90)
GFR calc non Af Amer: 85 mL/min — ABNORMAL LOW (ref >90)
Glucose, Bld: 124 mg/dL — ABNORMAL HIGH (ref 70–99)
Potassium: 4.4 mEq/L (ref 3.5–5.1)
Sodium: 129 mEq/L — ABNORMAL LOW (ref 135–145)

## 2011-10-09 LAB — CARDIAC PANEL(CRET KIN+CKTOT+MB+TROPI)
CK, MB: 3 ng/mL (ref 0.3–4.0)
Relative Index: INVALID (ref 0.0–2.5)
Total CK: 71 U/L (ref 7–232)
Troponin I: <0.3 ng/mL (ref <0.30)

## 2011-10-09 LAB — IRON AND TIBC
Iron: 90 ug/dL (ref 42–135)
TIBC: 265 ug/dL (ref 215–435)

## 2011-10-09 LAB — RETICULOCYTES: Retic Ct Pct: 1 % (ref 0.4–3.1)

## 2011-10-09 MED ORDER — SIMVASTATIN 20 MG PO TABS
40.0000 mg | ORAL_TABLET | Freq: Every day | ORAL | Status: DC
  Administered 2011-10-09: 40 mg via ORAL
  Filled 2011-10-09: qty 2

## 2011-10-09 MED ORDER — SODIUM CHLORIDE 0.9 % IV SOLN: INTRAVENOUS | Status: DC

## 2011-10-09 MED ORDER — ASPIRIN 81 MG PO TABS
81.0000 mg | ORAL_TABLET | Freq: Every day | ORAL | Status: DC
  Filled 2011-10-09 (×2): qty 1

## 2011-10-09 MED ORDER — LEVOTHYROXINE SODIUM 75 MCG PO TABS
150.0000 ug | ORAL_TABLET | Freq: Every day | ORAL | Status: DC
  Administered 2011-10-09 – 2011-10-10 (×2): 150 ug via ORAL
  Filled 2011-10-09: qty 1
  Filled 2011-10-09: qty 2
  Filled 2011-10-09: qty 1

## 2011-10-09 MED ORDER — ASPIRIN EC 81 MG PO TBEC
81.0000 mg | DELAYED_RELEASE_TABLET | Freq: Every day | ORAL | Status: DC
  Administered 2011-10-09 – 2011-10-10 (×2): 81 mg via ORAL
  Filled 2011-10-09 (×2): qty 1

## 2011-10-09 MED ORDER — NAPROXEN 250 MG PO TABS
375.0000 mg | ORAL_TABLET | Freq: Two times a day (BID) | ORAL | Status: DC
  Filled 2011-10-09: qty 2

## 2011-10-09 MED ORDER — TAMSULOSIN HCL 0.4 MG PO CAPS
0.4000 mg | ORAL_CAPSULE | Freq: Every day | ORAL | Status: DC
  Administered 2011-10-09 – 2011-10-10 (×2): 0.4 mg via ORAL
  Filled 2011-10-09 (×2): qty 1

## 2011-10-09 NOTE — H&P (Signed)
PCP:   Carylon Perches, MD, MD   Chief Complaint:  Past out  HPI: 78 year old male who is with his wife who is status post a laryngectomy from laryngeal cancer over 30 years ago and also pacemaker placement 2010 secondary to Mobitz type II arrhythmia who had gotten up and went to his wife today to check her blood pressure as she's been having some elevated blood pressures at home. He got up from the bed which sat next to her to take her blood pressure leaned forward and passed out. He soon became too was passed out less than several minutes and returned back to his normal mental status very quickly according to his wife. He did not have any loss of bowel or urinary function and the wife does not describe any seizure-like activity during this episode. He has never had a syncopal episode like this before. He's been in his normal state of health denies any nausea vomiting diarrhea. He denies any chest pain or abdominal pain. They do describe a possibility of having orthostatic hypotension in the past but are really unsure of this.  Review of Systems:  O/w neg.  Past Medical History: Past Medical History  Diagnosis Date  . Arteriosclerotic cardiovascular disease (ASCVD)     Nonobstructive; 09/2008 50% proximal and 40% mid LAD; 25% circumflex; 30% RCA; mild global LV dysfunction with EF of 45%. No aortic stenosis.  . Mild aortic stenosis     not documented at catheterization; verified by echo in 2011  . Peripheral vascular disease     With a 70% innominate artery stenosis and nonobstructive carotid stenosis  . Hypothyroidism   . Degenerative joint disease     s/p bilateral TKR  . Mobitz (type) II atrioventricular block     With bradycardia; Medtronic pacemaker implanted in 09/2008  . Tobacco abuse, in remission     Remote  . GERD (gastroesophageal reflux disease)   . Hyperlipidemia     Lipid profile in 04/2010:115, 98, 43, 52.  . Cancer of larynx     laryngectomy in 1988; postoperative radiation  therapy  . Weight loss     50 pounds between 1991 and 2011  . 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 Adrenal hyperplasia     Stable on serial imaging  . Anemia     minimal in 2011 with hemoglobin of 12.2 and high normal MCV  . Borderline hypertension     Normal CMet in 2011   Past Surgical History  Procedure Date  . Laryngectomy 1988    S/P laryngectomy and radiation therapy  . Appendectomy 1973  . Knee arthroscopy     Left  . Total knee arthroplasty     Bilateral, 19 years ago  . Cataract extraction, bilateral   . Decompression facial nerve     Right median  . Pacemaker insertion   . Insert / replace / remove pacemaker     Medications: Prior to Admission medications   Medication Sig Start Date End Date Taking? Authorizing Provider  aspirin 81 MG tablet Take 1 tablet (81 mg total) by mouth daily. 09/18/11  Yes Carylon Perches, MD  ibuprofen (ADVIL,MOTRIN) 600 MG tablet Take 600 mg by mouth every 6 (six) hours as needed. For pain   Yes Historical Provider, MD  levothyroxine (SYNTHROID, LEVOTHROID) 150 MCG tablet Take 1 tablet (150 mcg total) by mouth daily before breakfast. 09/18/11 09/17/12 Yes Carylon Perches, MD  naproxen (NAPROSYN)  375 MG tablet Take 375 mg by mouth 2 (two) times daily with a meal. For pain   Yes Historical Provider, MD  Omega-3 Fatty Acids (FISH OIL) 1200 MG CAPS Take 1 capsule by mouth daily.   Yes Historical Provider, MD  omeprazole (PRILOSEC) 20 MG capsule Take 20 mg by mouth daily. 30 minutes before meals   Yes Historical Provider, MD  OVER THE COUNTER MEDICATION Take 1 capsule by mouth 2 (two) times daily. SUPER BETA PROSTATE:   Yes Historical Provider, MD  simvastatin (ZOCOR) 40 MG tablet Take 40 mg by mouth at bedtime.  01/06/11  Yes Lewayne Bunting, MD  Tamsulosin HCl (FLOMAX) 0.4 MG CAPS Take 1 capsule (0.4 mg total) by mouth daily. 09/18/11  Yes Carylon Perches, MD  nitroGLYCERIN (NITROSTAT) 0.4 MG SL tablet Place 1 tablet (0.4  mg total) under the tongue every 5 (five) minutes x 3 doses as needed for chest pain. 07/16/11 07/15/12  Fredirick Maudlin, MD    Allergies:  No Known Allergies  Social History:  reports that he has quit smoking. His smoking use included Cigarettes. He has quit using smokeless tobacco. He reports that he does not drink alcohol or use illicit drugs.  Family History: Family History  Problem Relation Age of Onset  . Stroke Mother   . Leukemia Father   . Colon cancer Neg Hx   . Liver disease Neg Hx   . GI problems Neg Hx   . Stroke Other   . Diabetes Other     Physical Exam: Filed Vitals:   10/08/11 0022 10/08/11 2139 10/09/11 0016 10/09/11 0103  BP: 123/60 146/57 107/61 125/61  Pulse: 60 60 64 65  Temp:  97.7 F (36.5 C) 98.4 F (36.9 C) 97.5 F (36.4 C)  TempSrc:  Oral Oral Oral  Resp:  16 20 18   Height:  6\' 2"  (1.88 m)  6\' 2"  (1.88 m)  Weight:  81.647 kg (180 lb)  84.1 kg (185 lb 6.5 oz)  SpO2: 95% 97% 95% 98%   BP 125/61  Pulse 65  Temp(Src) 97.5 F (36.4 C) (Oral)  Resp 18  Ht 6\' 2"  (1.88 m)  Wt 84.1 kg (185 lb 6.5 oz)  BMI 23.80 kg/m2  SpO2 98% General appearance: alert, cooperative and no distress s/p laryngectomy with artificial voicebox external and he Lungs: clear to auscultation bilaterally Heart: regular rate and rhythm, S1, S2 normal, no murmur, click, rub or gallop Abdomen: soft, non-tender; bowel sounds normal; no masses,  no organomegaly Extremities: extremities normal, atraumatic, no cyanosis or edema Pulses: 2+ and symmetric Skin: Skin color, texture, turgor normal. No rashes or lesions Neurologic: Grossly normal  Labs on Admission:   [facility] 10/08/11 2220  NA 126*  K 3.8  CL 93*  CO2 25  GLUCOSE 102*  BUN 17  CREATININE 0.73  CALCIUM 9.0  MG --  PHOS --    Basename 10/08/11 2220  WBC 6.1  NEUTROABS 3.5  HGB 10.7*  HCT 30.2*  MCV 92.1  PLT 283    Basename 10/08/11 2220  CKTOTAL 72  CKMB 2.9  CKMBINDEX --  TROPONINI <0.30     Radiological Exams on Admission: Ct Head Wo Contrast  10/08/2011  *RADIOLOGY REPORT*  Clinical Data: Syncope, history vascular disease, laryngeal cancer  CT HEAD WITHOUT CONTRAST  Technique:  Contiguous axial images were obtained from the base of the skull through the vertex without contrast.  Comparison: 09/15/2011  Findings: Generalized atrophy. Normal ventricular morphology. No midline shift  or mass effect. Extensive small vessel chronic ischemic changes of deep cerebral white matter. Old lacunar infarcts right globus pallidus and questionably anterior limb of the right internal capsule. No intracranial hemorrhage, mass lesion, or evidence of acute infarction. Additional old infarcts are seen at the high right parietal cortex and in periventricular white matter in right frontal lobe.  No extra-axial fluid collection. Scattered atherosclerotic calcifications of internal carotid and vertebral arteries at skull base. Scattered mucosal thickening throughout the paranasal sinuses. Skull intact.  IMPRESSION: Atrophy with small vessel chronic ischemic changes of deep cerebral white matter. Multiple old infarcts. No acute intracranial abnormalities.  Original Report Authenticated By: Lollie Marrow, M.D.   Ct Head Wo Contrast  09/15/2011  *RADIOLOGY REPORT*  Clinical Data: Dizziness.  Weakness.  History of laryngeal cancer remotely.  Hypertension.  CT HEAD WITHOUT CONTRAST  Technique:  Contiguous axial images were obtained from the base of the skull through the vertex without contrast.  Comparison: 11/29/2007  Findings: The brain stem, cerebellum, cerebral peduncles, and thalami appear unremarkable.  Tiny hypodensities in the left lentiform nucleus probably represent dilated perivascular spaces or tiny remote lacunar infarcts.  Periventricular and corona radiata white matter hypodensities are most compatible with chronic ischemic microvascular white matter disease.  There is abnormal but well-defined hypodensity  in the right rostrum of the corpus callosum which is not present on the prior MRI from 2009, compatible with remote infarct.  There are remote infarcts at the parietal vertex bilaterally. No intracranial hemorrhage, mass lesion, or acute CVA is observed.  Scattered punctate to extra-axial calcifications along the right cerebral hemisphere are thought to be benign and incidental. Mucoperiosteal thickening noted in the maxillary sinuses and ethmoid air cells.  There is atherosclerotic calcification of the carotid siphons.  IMPRESSION:  1.  Remote infarcts include encephalomalacia along the parietal vertex bilaterally, and also in the rostrum of the right corpus callosum.  The corpus callosal lesion is new compared the prior MRI from 2009. 2. Periventricular and corona radiata white matter hypodensities are most compatible with chronic ischemic microvascular white matter disease. 3.  Chronic paranasal sinusitis. 4.  No acute CVA, mass lesion, or acute intracranial hemorrhages observed.  Original Report Authenticated By: Dellia Cloud, M.D.   Dg Chest Port 1 View  10/08/2011  *RADIOLOGY REPORT*  Clinical Data: Near-syncopal episode.  Weakness, diaphoresis, and lightheadedness.  PORTABLE CHEST - 1 VIEW  Comparison: 07/15/2011  Findings: Shallow inspiration with elevation of the left hemidiaphragm.  Cardiac enlargement with normal appearing pulmonary vascularity.  Fibrosis or atelectasis in the lung bases.  No focal airspace consolidation.  Tortuous and calcified aorta.  Cardiac pacemaker with stable appearance.  IMPRESSION: Shallow inspiration with elevation of left hemidiaphragm and atelectasis or fibrosis in the lung bases.  Cardiac enlargement. No focal consolidation.  Original Report Authenticated By: Marlon Pel, M.D.    Assessment/Plan Present on Admission: 78 year male with syncopal episode that sound most consistent with orthostatic hypotension versus arrhythmia.  .Syncope patient has no  focal neurological symptoms and is at his current baseline which is normal with the exception of using an external voicebox. CT of his head is revealing no acute changes. Going to monitor him on telemetry obtain serial cardiac enzymes. I will also order for pacemaker interrogation tomorrow. Do not think that MRI is needed at this point and is actually contraindicated due to his pacemaker. Also going to check orthostatics. He might be a little dehydrated. There is no source of infection.  Marland Kitchen  Hyponatremia IV fluids with normal saline overnight.  Marland KitchenPACEMAKER, PERMANENT interrogating a.m.  Patient is Dr. Alonza Smoker patient who will be taking over his care in the morning.    Ciani Rutten A [phone] 10/09/2011, 2:05 AM

## 2011-10-09 NOTE — ED Provider Notes (Signed)
Ct of head shows old infarcts.  Dr. Onalee Hua to admit for dr. Conard Novak, MD 10/09/11 [phone]

## 2011-10-09 NOTE — Progress Notes (Signed)
*  PRELIMINARY RESULTS* Echocardiogram 2D Echocardiogram has been performed.  Andre Holder 10/09/2011, 11:18 AM

## 2011-10-09 NOTE — Progress Notes (Signed)
NAMEMarland Kitchen  Andre, Holder NO.:  [id number]  MEDICAL RECORD NO.:  [id number]  LOCATION:  A320                          FACILITY:  [facility]  PHYSICIAN:  Kingsley Callander. Ouida Sills, MD       DATE OF BIRTH:  1923/08/10  DATE OF PROCEDURE:  10/09/2011 DATE OF DISCHARGE:                                PROGRESS NOTE   SUBJECTIVE:  [name] was admitted last night after experiencing a syncopal episode.  He was evaluated in the emergency room and admitted by the hospitalist.  He underwent a CT scan of the brain which revealed no acute abnormalities.  He has evidence of old strokes.  He was hemodynamically stable.  He has had 2 sets of cardiac enzymes which are negative.  He did not experience any chest pain.  He did not experience headache, vomiting, or any focal neurologic changes.  He has been followed with neuro checks without any changes.  OBJECTIVE:  VITAL SIGNS:  Temperature is 97.8, pulse 51, respirations 18, blood pressure 110/59, oxygen saturation 92%. GENERAL:  He is alert and oriented. LUNGS:  Clear. HEART:  Regular with a grade 2 systolic murmur. ABDOMEN:  Nontender. EXTREMITIES:  Reveal no edema. NEURO:  Reveals no focal weakness.  IMPRESSION/PLAN: 1. Syncope.  He will be observed further today.  He will have a     checkup of his pacemaker.  An echocardiogram has been ordered. 2. Normocytic anemia.  His hemoglobin is 10.4 with an MCV of 92.  He     has had no evidence of bleeding. 3. Hyponatremia.  Serum sodium is mildly decreased to 129. 4. Carotid artery disease.  Vascular surgery consultation is pending. 5. Old strokes.  Continue aspirin and simvastatin. 6. History of laryngeal carcinoma.  He has had no evidence of     recurrence. 7. Hypothyroidism.  He was euthyroid earlier this month with a TSH at     the [place].     Kingsley Callander. Ouida Sills, MD     ROF/MEDQ  D:  10/09/2011  T:  10/09/2011  Job:  [id number]

## 2011-10-09 NOTE — ED Notes (Signed)
Report given to Rhae Hammock, RN. Ready for patient transfer.

## 2011-10-09 NOTE — Progress Notes (Signed)
UR Chart Review Completed  

## 2011-10-10 LAB — FOLATE: Folate: >20 ng/mL

## 2011-10-10 LAB — VITAMIN B12: Vitamin B-12: 417 pg/mL (ref 211–911)

## 2011-10-10 MED ORDER — BISACODYL 10 MG RE SUPP
10.0000 mg | Freq: Every day | RECTAL | Status: DC | PRN
  Administered 2011-10-10: 10 mg via RECTAL
  Filled 2011-10-10: qty 1

## 2011-10-10 NOTE — Progress Notes (Signed)
Patients pacemaker was checked and analyzed, per nurse report everything appeared okay. Notified Dr. Ouida Holder who stated patient was ready for discharge. Patient is having difficulty with bowels this am, suppository and fleets given.

## 2011-10-10 NOTE — Discharge Summary (Signed)
NAMEMarland Holder  [name], [name]:  [id number]  MEDICAL RECORD NO.:  [id number]  LOCATION:  A320                          FACILITY:  [facility]  PHYSICIAN:  Kingsley Callander. Ouida Sills, MD       DATE OF BIRTH:  [birth year]  DATE OF ADMISSION:  10/08/2011 DATE OF DISCHARGE:  LH                              DISCHARGE SUMMARY   DISCHARGE DIAGNOSES: 1. Syncope. 2. Carotid artery disease. 3. Status post pacemaker placement. 4. Hypothyroidism. 5. History of laryngeal carcinoma status post laryngectomy. 6. Anemia of chronic disease. 7. Hyponatremia. 8. Nonobstructive coronary artery disease. 9. Mild aortic stenosis. 10.Degenerative joint disease. 11.Old strokes.  DISCHARGE MEDICATIONS: 1. Aspirin 81 mg daily. 2. Levothyroxine 150 mcg daily. 3. Simvastatin 40 mg at bedtime. 4. Flomax 0.4 mg daily.  PROCEDURES:  Echocardiogram, head CT.  HOSPITAL COURSE:  This patient is an 78 year old male who presented after experiencing a syncopal episode at home.  He was found to be at his neurological baseline.  In the ER, he had experienced no chest pain. He was hospitalized and monitored.  Serial cardiac enzymes were normal. He underwent a CT scan of the brain which revealed evidence of old strokes but no acute abnormalities.  He underwent an echocardiogram, which revealed a reduction in his left ventricular systolic function since his prior echo.  His ejection fraction is measured at approximately 35%.  His aortic stenosis is very mild.  He is having pacemaker interrogation.  The results are not yet available.  He was mildly hyponatremic.  His serum sodium was 126 and 129.  Renal function is normal.  He has anemia of chronic disease.  Hemoglobins were 10.7 and 10.4.  He has no bleeding.  His iron was 70, TIBC 265, ferritin 152, folate greater than 20.  Vitamin B12 417.  His EKG has revealed a paced rhythm with no signs of dysrhythmias.  He has known carotid artery disease and has vascular  surgery consultation pending.  He will continue simvastatin and aspirin.  He has recently been euthyroid on his current Synthroid dose.  He believes he may have been treated at the [facility] for hypertension.  The medication used is unknown.  He will bring this to the office for clarification.  He has remained hemodynamically stable.  He has a history of laryngeal carcinoma but has had no sign of recurrence. If his pacemaker interrogation is satisfactory, he will be discharged later today with followup in the office in 1 week and with vascular surgery followup scheduled.     Kingsley Callander. Ouida Sills, MD     ROF/MEDQ  D:  10/10/2011  T:  10/10/2011  Job:  [id number]

## 2011-10-10 NOTE — Progress Notes (Signed)
CARE MANAGEMENT NOTE 10/10/2011  Patient:  Andre Holder, Andre Holder   Account Number:  [id number]  Date Initiated:  10/10/2011  Documentation initiated by:  Anibal Henderson  Subjective/Objective Assessment:   Admitted with syncopy. Lives at home with spouse. Pt is usually independent at home, and will be returning home.     Action/Plan:   Pt would [facility]. Notified business office, and review sent to [place]   Anticipated DC Date:  10/10/2011   Anticipated DC Plan:  HOME/SELF CARE      DC Planning Services  CM consult      Choice offered to / List presented to:             Status of service:  Completed, signed off Medicare Important Message given?   (If response is "NO", the following Medicare IM given date fields will be blank) Date Medicare IM given:   Date Additional Medicare IM given:    Discharge Disposition:    Per UR Regulation:  Reviewed for med. necessity/level of care/duration of stay  Comments:  10/10/11 1300 Anibal Henderson RN

## 2011-10-10 NOTE — Progress Notes (Signed)
Pt discharged home via family; Pt and family given and explained all discharge instructions, carenotes, and prescriptions; pt and family stated understanding and denied questions/concerns; all f/u appointments in place; IV removed without complicaitons; pt stable at time of discharge  

## 2011-10-10 NOTE — Progress Notes (Signed)
Patient able to have large BM with help of disimpaction. Patient states he feels better, ready for discharge. Tried to call wife, will try again around 63.

## 2011-10-16 ENCOUNTER — Encounter: Payer: Self-pay | Admitting: Internal Medicine

## 2011-10-16 ENCOUNTER — Ambulatory Visit (INDEPENDENT_AMBULATORY_CARE_PROVIDER_SITE_OTHER): Payer: Medicare Other | Admitting: Internal Medicine

## 2011-10-16 VITALS — BP 137/81 | HR 79 | Resp 18 | Ht 74.0 in | Wt 187.0 lb

## 2011-10-16 DIAGNOSIS — I739 Peripheral vascular disease, unspecified: Secondary | ICD-10-CM

## 2011-10-16 DIAGNOSIS — Z950 Presence of cardiac pacemaker: Secondary | ICD-10-CM

## 2011-10-16 DIAGNOSIS — I441 Atrioventricular block, second degree: Secondary | ICD-10-CM

## 2011-10-16 LAB — PACEMAKER DEVICE OBSERVATION
AL THRESHOLD: 0.375 V
ATRIAL PACING PM: 25
BAMS-0001: 175 {beats}/min
RV LEAD IMPEDENCE PM: 532 Ohm
RV LEAD THRESHOLD: 0.625 V

## 2011-10-16 NOTE — Assessment & Plan Note (Signed)
His device is working normally. The patient prefers to followup at the [facility]. We will see him back on an as-needed basis.

## 2011-10-16 NOTE — Progress Notes (Signed)
HPI Andre Holder returns today for followup. He is a very pleasant 78 year old man with a history of symptomatic bradycardia status post pacemaker insertion, hypertension, dyslipidemia, and atherosclerotic vascular disease. He denies chest pain, shortness of breath, or peripheral edema. No Known Allergies   Current Outpatient Prescriptions  Medication Sig Dispense Refill  . aspirin 81 MG tablet Take 1 tablet (81 mg total) by mouth daily.  30 tablet  12  . levothyroxine (SYNTHROID, LEVOTHROID) 150 MCG tablet Take 1 tablet (150 mcg total) by mouth daily before breakfast.  30 tablet  12  . Multiple Vitamins-Minerals (MULTI COMPLETE PO) Take by mouth.      . simvastatin (ZOCOR) 40 MG tablet Take 40 mg by mouth at bedtime.       . Tamsulosin HCl (FLOMAX) 0.4 MG CAPS Take 1 capsule (0.4 mg total) by mouth daily.  30 capsule  12     Past Medical History  Diagnosis Date  . Arteriosclerotic cardiovascular disease (ASCVD)     Nonobstructive; 09/2008 50% proximal and 40% mid LAD; 25% circumflex; 30% RCA; mild global LV dysfunction with EF of 45%. No aortic stenosis.  . Mild aortic stenosis     not documented at catheterization; verified by echo in 2011  . Peripheral vascular disease     With a 70% innominate artery stenosis and nonobstructive carotid stenosis  . Hypothyroidism   . Degenerative joint disease     s/p bilateral TKR  . Mobitz (type) II atrioventricular block     With bradycardia; Medtronic pacemaker implanted in 09/2008  . Tobacco abuse, in remission     Remote  . GERD (gastroesophageal reflux disease)   . Hyperlipidemia     Lipid profile in 04/2010:115, 98, 43, 52.  . Cancer of larynx     laryngectomy in 1988; postoperative radiation therapy  . Weight loss     50 pounds between 1991 and 2011  . 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 Adrenal hyperplasia     Stable on serial imaging  . Anemia     minimal in 2011 with hemoglobin of 12.2  and high normal MCV  . Borderline hypertension     Normal CMet in 2011    ROS:   All systems reviewed and negative except as noted in the HPI.   Past Surgical History  Procedure Date  . Laryngectomy 1988    S/P laryngectomy and radiation therapy  . Appendectomy 1973  . Knee arthroscopy     Left  . Total knee arthroplasty     Bilateral, 19 years ago  . Cataract extraction, bilateral   . Decompression facial nerve     Right median  . Pacemaker insertion   . Insert / replace / remove pacemaker      Family History  Problem Relation Age of Onset  . Stroke Mother   . Leukemia Father   . Colon cancer Neg Hx   . Liver disease Neg Hx   . GI problems Neg Hx   . Stroke Other   . Diabetes Other      History   Social History  . Marital Status: Married    Spouse Name: N/A    Number of Children: N/A  . Years of Education: N/A   Occupational History  . Retired from [employer]    Social History Main Topics  . Smoking status: Former Smoker    Types: Cigarettes  . Smokeless tobacco: Not  on file   Comment: Quit 30 years  . Alcohol Use: No  . Drug Use: No  . Sexually Active: Not Currently   Other Topics Concern  . Not on file   Social History Narrative   Lives in [place] with spouse of 64 years     BP 137/81  Pulse 79  Resp 18  Ht 6\' 2"  (1.88 m)  Wt 84.823 kg (187 lb)  BMI 24.01 kg/m2  Physical Exam:  Well appearing elderly man, NAD HEENT: Unremarkable except for tracheostomy. Neck:  No JVD, no thyromegally Lungs:  Clear with no wheezes, rales, or rhonchi. Well-healed pacemaker incision. HEART:  Regular rate rhythm, no murmurs, no rubs, no clicks Abd:  soft, positive bowel sounds, no organomegally, no rebound, no guarding Ext:  2 plus pulses, no edema, no cyanosis, no clubbing Skin:  No rashes no nodules Neuro:  CN II through XII intact, motor grossly intact  DEVICE  Normal device function.  See PaceArt for details.   Assess/Plan:

## 2011-10-16 NOTE — Assessment & Plan Note (Signed)
He is currently asymptomatic. He does have peripheral vascular disease. He will followup at the [facility]. He is no longer smoking cigarettes.

## 2011-10-16 NOTE — Patient Instructions (Signed)
Your physician recommends that you schedule a follow-up appointment in: As needed  

## 2011-12-11 ENCOUNTER — Emergency Department (HOSPITAL_COMMUNITY)
Admission: EM | Admit: 2011-12-11 | Discharge: 2011-12-12 | Disposition: A | Discharge status: Home / Self Care | Payer: Medicare HMO | Attending: Emergency Medicine | Admitting: Emergency Medicine

## 2011-12-11 ENCOUNTER — Encounter (HOSPITAL_COMMUNITY): Payer: Self-pay | Admitting: Emergency Medicine

## 2011-12-11 DIAGNOSIS — E785 Hyperlipidemia, unspecified: Secondary | ICD-10-CM | POA: Insufficient documentation

## 2011-12-11 DIAGNOSIS — R55 Syncope and collapse: Secondary | ICD-10-CM | POA: Insufficient documentation

## 2011-12-11 DIAGNOSIS — R42 Dizziness and giddiness: Secondary | ICD-10-CM | POA: Insufficient documentation

## 2011-12-11 DIAGNOSIS — R404 Transient alteration of awareness: Secondary | ICD-10-CM | POA: Insufficient documentation

## 2011-12-11 DIAGNOSIS — E039 Hypothyroidism, unspecified: Secondary | ICD-10-CM | POA: Insufficient documentation

## 2011-12-11 DIAGNOSIS — S0100XA Unspecified open wound of scalp, initial encounter: Secondary | ICD-10-CM | POA: Insufficient documentation

## 2011-12-11 DIAGNOSIS — Z7982 Long term (current) use of aspirin: Secondary | ICD-10-CM | POA: Insufficient documentation

## 2011-12-11 DIAGNOSIS — K219 Gastro-esophageal reflux disease without esophagitis: Secondary | ICD-10-CM | POA: Insufficient documentation

## 2011-12-11 DIAGNOSIS — R296 Repeated falls: Secondary | ICD-10-CM | POA: Insufficient documentation

## 2011-12-11 DIAGNOSIS — Z8521 Personal history of malignant neoplasm of larynx: Secondary | ICD-10-CM | POA: Insufficient documentation

## 2011-12-11 DIAGNOSIS — S0990XA Unspecified injury of head, initial encounter: Secondary | ICD-10-CM | POA: Insufficient documentation

## 2011-12-11 DIAGNOSIS — Z79899 Other long term (current) drug therapy: Secondary | ICD-10-CM | POA: Insufficient documentation

## 2011-12-11 DIAGNOSIS — I2510 Atherosclerotic heart disease of native coronary artery without angina pectoris: Secondary | ICD-10-CM | POA: Insufficient documentation

## 2011-12-11 DIAGNOSIS — I739 Peripheral vascular disease, unspecified: Secondary | ICD-10-CM | POA: Insufficient documentation

## 2011-12-11 DIAGNOSIS — R4789 Other speech disturbances: Secondary | ICD-10-CM | POA: Insufficient documentation

## 2011-12-11 DIAGNOSIS — R5381 Other malaise: Secondary | ICD-10-CM | POA: Insufficient documentation

## 2011-12-11 DIAGNOSIS — R011 Cardiac murmur, unspecified: Secondary | ICD-10-CM | POA: Insufficient documentation

## 2011-12-11 NOTE — ED Notes (Signed)
Pt was at home and became lightheaded and had syncope episode.  Pt recently DC from [place] for ? Blockage of carotids.  Pt has no cc at present other than just doesn't feel good.

## 2011-12-11 NOTE — ED Provider Notes (Signed)
History     CSN: [id number]  Arrival date & time 12/11/11  2342   First MD Initiated Contact with Patient 12/11/11 2337      Chief Complaint  Patient presents with  . Loss of Consciousness    HPI The patient presents after 2 falls and episodes of lightheadedness throughout the day.  Notably, the patient had a more pronounced episode approximately 24 hours ago that included.  Consciousness and was taken to the [place].  He notes that his evaluation within the past 24 hours included a head CT, labs.  He was told he has carotid artery disease, no new intracranial hemorrhage.  He was discharged with outpatient followup for evaluation and management of his carotid lesions.  Since arriving home he has been lightheaded, more pronounced when upright.  Twice he had episodes of increased lightheadedness leading to falls.  He notes head trauma on the most recent of these falls including a laceration to his left temporal area.  No complete loss of consciousness.  No nausea, no confusion, no disorientation, no asymmetric weakness.  He also c/o R hip pain (worse w motion) Past Medical History  Diagnosis Date  . Arteriosclerotic cardiovascular disease (ASCVD)     Nonobstructive; 09/2008 50% proximal and 40% mid LAD; 25% circumflex; 30% RCA; mild global LV dysfunction with EF of 45%. No aortic stenosis.  . Mild aortic stenosis     not documented at catheterization; verified by echo in 2011  . Peripheral vascular disease     With a 70% innominate artery stenosis and nonobstructive carotid stenosis  . Hypothyroidism   . Degenerative joint disease     s/p bilateral TKR  . Mobitz (type) II atrioventricular block     With bradycardia; Medtronic pacemaker implanted in 09/2008  . Tobacco abuse, in remission     Remote  . GERD (gastroesophageal reflux disease)   . Hyperlipidemia     Lipid profile in 04/2010:115, 98, 43, 52.  . Cancer of larynx     laryngectomy in 1988; postoperative radiation therapy  . Weight  loss     50 pounds between 1991 and 2011  . 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 Adrenal hyperplasia     Stable on serial imaging  . Anemia     minimal in 2011 with hemoglobin of 12.2 and high normal MCV  . Borderline hypertension     Normal CMet in 2011    Past Surgical History  Procedure Date  . Laryngectomy 1988    S/P laryngectomy and radiation therapy  . Appendectomy 1973  . Knee arthroscopy     Left  . Total knee arthroplasty     Bilateral, 19 years ago  . Cataract extraction, bilateral   . Decompression facial nerve     Right median  . Pacemaker insertion   . Insert / replace / remove pacemaker     Family History  Problem Relation Age of Onset  . Stroke Mother   . Leukemia Father   . Colon cancer Neg Hx   . Liver disease Neg Hx   . GI problems Neg Hx   . Stroke Other   . Diabetes Other     History  Substance Use Topics  . Smoking status: Former Smoker    Types: Cigarettes  . Smokeless tobacco: Not on file   Comment: Quit 30 years  . Alcohol Use: No      Review of  Systems  Constitutional:       Per HPI, otherwise negative  HENT:       Per HPI, otherwise negative  Eyes: Negative.   Respiratory:       Per HPI, otherwise negative  Cardiovascular:       Per HPI, otherwise negative  Gastrointestinal: Negative for vomiting.  Genitourinary: Negative.   Musculoskeletal:       Per HPI, otherwise negative  Skin: Negative.   Neurological: Positive for syncope, speech difficulty, weakness and light-headedness. Negative for dizziness, facial asymmetry, numbness and headaches.    Allergies  Review of patient's allergies indicates no known allergies.  Home Medications   Current Outpatient Rx  Name Route Sig Dispense Refill  . ASPIRIN 81 MG PO TABS Oral Take 1 tablet (81 mg total) by mouth daily. 30 tablet 12  . LEVOTHYROXINE SODIUM 150 MCG PO TABS Oral Take 1 tablet (150 mcg total) by mouth daily before  breakfast. 30 tablet 12  . MULTI COMPLETE PO Oral Take by mouth.    Marland Kitchen SIMVASTATIN 40 MG PO TABS Oral Take 40 mg by mouth at bedtime.     . TAMSULOSIN HCL 0.4 MG PO CAPS Oral Take 1 capsule (0.4 mg total) by mouth daily. 30 capsule 12    BP 141/55  Pulse 69  Temp(Src) 98.2 F (36.8 C) (Oral)  Resp 16  SpO2 96%  Physical Exam  Nursing note and vitals reviewed. Constitutional: He is oriented to person, place, and time. He is active. He appears ill. No distress.  HENT:  Head: Head is with laceration. Head is without raccoon's eyes, without Battle's sign, without abrasion, without contusion, without right periorbital erythema and without left periorbital erythema. Hair is normal.    Mouth/Throat: Oropharynx is clear and moist and mucous membranes are normal.       Patient uses voice-box for phonation.  Eyes: Conjunctivae and EOM are normal.  Cardiovascular: Normal rate and regular rhythm.   Murmur heard. Pulmonary/Chest: Effort normal. No stridor.  Abdominal: Soft. He exhibits no distension.  Musculoskeletal: He exhibits no edema and no tenderness.  Neurological: He is alert and oriented to person, place, and time. No cranial nerve deficit.  Skin: Skin is warm and dry.  Psychiatric: He has a normal mood and affect.    ED Course  Procedures (including critical care time)   Labs Reviewed  CBC  DIFFERENTIAL  COMPREHENSIVE METABOLIC PANEL  TROPONIN I   No results found.   No diagnosis found.  Ct, cxr reviewed by me  Cardiac: 70 sr -normal  Pulse ox  97% ra- normal   Date: 12/12/2011  Rate: 65  Rhythm: normal sinus rhythm  QRS Axis: left  Intervals: normal  ST/T Wave abnormalities: nonspecific T wave changes  Conduction Disutrbances:left bundle branch block  Narrative Interpretation:   Old EKG Reviewed: none available ABNORMAL    MDM  This elderly male with multiple medical problems, including vascular disease, aortic stenosis, recently evaluated carotid  disease now presents with ongoing lightheadedness that contributed to 2 falls that occurred before admission.  On my exam the patient is in no distress with only a superficial left temporal laceration has a notable physical exam finding.  The patient's vital signs are unremarkable his ECG is nonischemic, and the patient has no complaints.  Given the patient's known disease, his essentially reassuring evaluation here, he is appropriate for continued management as an outpatient.  The patient has his physicians at the [place], including his vascular surgery team.  I discussed all results with the patient and his family.  They understand the need for calling tomorrow to arrange expedited, appropriate followup care.  The patient was discharged in stable condition.    Gerhard Munch, MD 12/12/11 [phone]

## 2011-12-12 ENCOUNTER — Emergency Department (HOSPITAL_COMMUNITY): Payer: Medicare HMO

## 2011-12-12 ENCOUNTER — Other Ambulatory Visit: Payer: Self-pay

## 2011-12-12 LAB — DIFFERENTIAL
Basophils Absolute: 0.1 10*3/uL (ref 0.0–0.1)
Eosinophils Absolute: 0.4 10*3/uL (ref 0.0–0.7)
Eosinophils Relative: 6 % — ABNORMAL HIGH (ref 0–5)
Lymphocytes Relative: 13 % (ref 12–46)

## 2011-12-12 LAB — CBC
MCH: 32.8 pg (ref 26.0–34.0)
MCV: 94.9 fL (ref 78.0–100.0)
Platelets: 227 10*3/uL (ref 150–400)
RDW: 13 % (ref 11.5–15.5)
WBC: 7.2 10*3/uL (ref 4.0–10.5)

## 2011-12-12 LAB — COMPREHENSIVE METABOLIC PANEL
ALT: 15 U/L (ref 0–53)
AST: 21 U/L (ref 0–37)
Calcium: 9.5 mg/dL (ref 8.4–10.5)
Sodium: 131 mEq/L — ABNORMAL LOW (ref 135–145)
Total Protein: 6.9 g/dL (ref 6.0–8.3)

## 2011-12-12 MED ORDER — MORPHINE SULFATE 2 MG/ML IJ SOLN
2.0000 mg | Freq: Once | INTRAMUSCULAR | Status: AC
  Administered 2011-12-12: 2 mg via INTRAVENOUS
  Filled 2011-12-12: qty 1

## 2011-12-12 NOTE — Discharge Instructions (Signed)
It is extremely important that you follow up as discussed at the [place] as soon as possible.  Please make sure to call today to arrange appropriate followup care.  If you develop any new, or concerning changes in your condition, such as new episodes of losing consciousness, new falls, or any other concerning changes, please return to emergency department immediately.

## 2012-01-06 ENCOUNTER — Emergency Department (HOSPITAL_COMMUNITY)
Admission: EM | Admit: 2012-01-06 | Discharge: 2012-01-06 | Disposition: A | Discharge status: Home / Self Care | Payer: Medicare HMO | Attending: Emergency Medicine | Admitting: Emergency Medicine

## 2012-01-06 ENCOUNTER — Encounter (HOSPITAL_COMMUNITY): Payer: Self-pay

## 2012-01-06 DIAGNOSIS — Z79899 Other long term (current) drug therapy: Secondary | ICD-10-CM | POA: Insufficient documentation

## 2012-01-06 DIAGNOSIS — R319 Hematuria, unspecified: Secondary | ICD-10-CM | POA: Insufficient documentation

## 2012-01-06 DIAGNOSIS — K219 Gastro-esophageal reflux disease without esophagitis: Secondary | ICD-10-CM | POA: Insufficient documentation

## 2012-01-06 DIAGNOSIS — R339 Retention of urine, unspecified: Secondary | ICD-10-CM | POA: Insufficient documentation

## 2012-01-06 DIAGNOSIS — I2510 Atherosclerotic heart disease of native coronary artery without angina pectoris: Secondary | ICD-10-CM | POA: Insufficient documentation

## 2012-01-06 DIAGNOSIS — R5381 Other malaise: Secondary | ICD-10-CM | POA: Insufficient documentation

## 2012-01-06 DIAGNOSIS — E039 Hypothyroidism, unspecified: Secondary | ICD-10-CM | POA: Insufficient documentation

## 2012-01-06 LAB — URINALYSIS, MICROSCOPIC ONLY
Bilirubin Urine: NEGATIVE
Nitrite: NEGATIVE
Specific Gravity, Urine: 1.01 (ref 1.005–1.030)
pH: 7 (ref 5.0–8.0)

## 2012-01-06 MED ORDER — SULFAMETHOXAZOLE-TRIMETHOPRIM 800-160 MG PO TABS: 1.0000 | ORAL_TABLET | Freq: Two times a day (BID) | ORAL | Status: AC

## 2012-01-06 MED ORDER — SULFAMETHOXAZOLE-TRIMETHOPRIM 200-40 MG/5ML PO SUSP: 20.0000 mL | Freq: Once | ORAL | Status: DC

## 2012-01-06 MED ORDER — SULFAMETHOXAZOLE-TMP DS 800-160 MG PO TABS
1.0000 | ORAL_TABLET | Freq: Once | ORAL | Status: AC
  Administered 2012-01-06: 1 via ORAL
  Filled 2012-01-06: qty 1

## 2012-01-06 NOTE — ED Notes (Signed)
Foley and leg bag teaching provided; Understanding demostrated

## 2012-01-06 NOTE — ED Provider Notes (Signed)
History     CSN: [id number]  Arrival date & time 01/06/12  0208   First MD Initiated Contact with Patient 01/06/12 0235      Chief Complaint  Patient presents with  . Weakness  . Urinary Retention    (Consider location/radiation/quality/duration/timing/severity/associated sxs/prior treatment) HPI Comments: 78 year old male with a history of aortic stenosis, pacemaker placement for AV block, cancer of the larynx status post laryngectomy. He presents with a complaint of intermittent urinary frequency, mild dysuria and suprapubic abdominal pain. He has had associated generalized weakness this evening, has felt subjective fevers and intermittent diaphoresis. He denies vomiting, hematuria, cough, shortness of breath, chest pain, back pain, swelling. He does admit to having mild nausea. Nothing makes this better or worse, symptoms are persistent throughout the evening, has a history of urinary retention last year.  Patient is a 78 y.o. male presenting with weakness. The history is provided by the patient and the spouse.  Weakness  Additional symptoms include weakness.    Past Medical History  Diagnosis Date  . Arteriosclerotic cardiovascular disease (ASCVD)     Nonobstructive; 09/2008 50% proximal and 40% mid LAD; 25% circumflex; 30% RCA; mild global LV dysfunction with EF of 45%. No aortic stenosis.  . Mild aortic stenosis     not documented at catheterization; verified by echo in 2011  . Peripheral vascular disease     With a 70% innominate artery stenosis and nonobstructive carotid stenosis  . Hypothyroidism   . Degenerative joint disease     s/p bilateral TKR  . Mobitz (type) II atrioventricular block     With bradycardia; Medtronic pacemaker implanted in 09/2008  . Tobacco abuse, in remission     Remote  . GERD (gastroesophageal reflux disease)   . Hyperlipidemia     Lipid profile in 04/2010:115, 98, 43, 52.  . Cancer of larynx     laryngectomy in 1988; postoperative radiation  therapy  . Weight loss     50 pounds between 1991 and 2011  . 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 Adrenal hyperplasia     Stable on serial imaging  . Anemia     minimal in 2011 with hemoglobin of 12.2 and high normal MCV  . Borderline hypertension     Normal CMet in 2011    Past Surgical History  Procedure Date  . Laryngectomy 1988    S/P laryngectomy and radiation therapy  . Appendectomy 1973  . Knee arthroscopy     Left  . Total knee arthroplasty     Bilateral, 19 years ago  . Cataract extraction, bilateral   . Decompression facial nerve     Right median  . Pacemaker insertion   . Insert / replace / remove pacemaker     Family History  Problem Relation Age of Onset  . Stroke Mother   . Leukemia Father   . Colon cancer Neg Hx   . Liver disease Neg Hx   . GI problems Neg Hx   . Stroke Other   . Diabetes Other     History  Substance Use Topics  . Smoking status: Former Smoker    Types: Cigarettes  . Smokeless tobacco: Not on file   Comment: Quit 30 years  . Alcohol Use: No      Review of Systems  Neurological: Positive for weakness.  All other systems reviewed and are negative.    Allergies  Review of patient's  allergies indicates no known allergies.  Home Medications   Current Outpatient Rx  Name Route Sig Dispense Refill  . ASPIRIN 81 MG PO TABS Oral Take 1 tablet (81 mg total) by mouth daily. 30 tablet 12  . LEVOTHYROXINE SODIUM 150 MCG PO TABS Oral Take 1 tablet (150 mcg total) by mouth daily before breakfast. 30 tablet 12  . MULTI COMPLETE PO Oral Take by mouth.    Marland Kitchen SIMVASTATIN 40 MG PO TABS Oral Take 40 mg by mouth at bedtime.     . SULFAMETHOXAZOLE-TRIMETHOPRIM 800-160 MG PO TABS Oral Take 1 tablet by mouth every 12 (twelve) hours. 14 tablet 0  . TAMSULOSIN HCL 0.4 MG PO CAPS Oral Take 1 capsule (0.4 mg total) by mouth daily. 30 capsule 12    BP 168/100  Temp(Src) 98.8 F (37.1 C) (Oral)   Resp 18  Ht 6' 2.5" (1.892 m)  Wt 170 lb (77.111 kg)  BMI 21.53 kg/m2  SpO2 95%  Physical Exam  Nursing note and vitals reviewed. Constitutional: He appears well-developed and well-nourished. No distress.  HENT:  Head: Normocephalic and atraumatic.  Mouth/Throat: Oropharynx is clear and moist. No oropharyngeal exudate.       Oropharynx is moist, no erythema exudate or hypertrophy or asymmetry  Eyes: Conjunctivae and EOM are normal. Pupils are equal, round, and reactive to light. Right eye exhibits no discharge. Left eye exhibits no discharge. No scleral icterus.       Pupils are 2 mm bilaterally, normal extraocular movements, clear conjunctiva  Neck: Normal range of motion. Neck supple. No JVD present. No thyromegaly present.       No lymphadenopathy, patient speaks with electronic assisted device  Cardiovascular: Normal rate, regular rhythm and intact distal pulses.  Exam reveals no gallop and no friction rub.   Murmur ( soft systolic) heard. Pulmonary/Chest: Effort normal and breath sounds normal. No respiratory distress. He has no wheezes. He has no rales.  Abdominal: Soft. Bowel sounds are normal. He exhibits no distension and no mass. There is tenderness ( suprapubic tenderness, no guarding).       No pain at McBurney's point, no left lower quadrant tenderness, no upper abdominal or mid abdominal tenderness. Non-peritoneal  Genitourinary:       Normal appearing uncircumcised penis and testicles, normal scrotum, no hernias palpated  Musculoskeletal: Normal range of motion. He exhibits no edema and no tenderness.  Lymphadenopathy:    He has no cervical adenopathy.  Neurological: He is alert. Coordination normal.  Skin: Skin is warm and dry. No rash noted. No erythema.  Psychiatric: He has a normal mood and affect. His behavior is normal.    ED Course  Procedures (including critical care time)  Labs Reviewed  URINALYSIS, WITH MICROSCOPIC - Abnormal; Notable for the following:      Hgb urine dipstick LARGE (*)    All other components within normal limits  URINE CULTURE   No results found.   1. Urinary retention   2. Hematuria       MDM  Patient has exam consistent with a possible cystitis or urinary tract infection. Currently he has no fever, no tachycardia, no respiratory distress and a normal exam other than some mild suprapubic tenderness. We'll check urinalysis to rule out infection.  Bedside ultrasound shows that the patient has urinary retention with a full bladder. Foley catheter placed as the patient is unable to adequately urinate. Urine sample reviewed and shows that he does have some hematuria but no significant infection.  Urine culture ordered, antibiotic started, patient explained his results and informed that he should followup within the week with his family Dr. For a recheck.  Discharge Prescriptions include:  Bactrim   Vida Roller, MD 01/06/12 [phone]

## 2012-01-06 NOTE — ED Notes (Signed)
Pt states he "just feels weak all over"  Also states he has urinated several times tonight and feels like he has to go all the time but not much comes out.

## 2012-01-06 NOTE — Discharge Instructions (Signed)
Your urine sample shows that you do have some blood in your urine.  You also have urinary retention meaning that you're unable to urinate adequately. We have placed a catheter in your bladder which should stay there until you followup with your doctor or the urologist.  Please call your family doctor in the next 24 hours to schedule a close followup appointment within the next week. If you should develop severe or worsening pain, vomiting, fevers return to the hospital immediately.  Your urine sample should be rechecked within the week to make sure that the blood in your urine is clearing. I have ordered a urine culture, if this shows that you need a different antibiotic, you will be contacted and one will be prescribed for you.  Take Bactrim twice a day for 7 days as prescribed

## 2012-01-08 LAB — URINE CULTURE

## 2012-01-09 NOTE — ED Notes (Signed)
Results received from [facility]. (+) [facility] -> 80,000 colonies Klebsiella Oxytoca.Rx given in ED for Sulfa-Trimeth -> sensitive to the same.  Chart appended per protocol.

## 2012-04-13 ENCOUNTER — Emergency Department (HOSPITAL_COMMUNITY): Payer: Medicare HMO

## 2012-04-13 ENCOUNTER — Emergency Department (HOSPITAL_COMMUNITY)
Admission: EM | Admit: 2012-04-13 | Discharge: 2012-04-13 | Disposition: A | Discharge status: Home / Self Care | Payer: Medicare HMO | Attending: Emergency Medicine | Admitting: Emergency Medicine

## 2012-04-13 ENCOUNTER — Encounter (HOSPITAL_COMMUNITY): Payer: Self-pay

## 2012-04-13 DIAGNOSIS — I2510 Atherosclerotic heart disease of native coronary artery without angina pectoris: Secondary | ICD-10-CM | POA: Insufficient documentation

## 2012-04-13 DIAGNOSIS — Z8521 Personal history of malignant neoplasm of larynx: Secondary | ICD-10-CM | POA: Insufficient documentation

## 2012-04-13 DIAGNOSIS — E039 Hypothyroidism, unspecified: Secondary | ICD-10-CM | POA: Insufficient documentation

## 2012-04-13 DIAGNOSIS — K219 Gastro-esophageal reflux disease without esophagitis: Secondary | ICD-10-CM | POA: Insufficient documentation

## 2012-04-13 DIAGNOSIS — Z7982 Long term (current) use of aspirin: Secondary | ICD-10-CM | POA: Insufficient documentation

## 2012-04-13 DIAGNOSIS — Z9089 Acquired absence of other organs: Secondary | ICD-10-CM | POA: Insufficient documentation

## 2012-04-13 DIAGNOSIS — R42 Dizziness and giddiness: Secondary | ICD-10-CM | POA: Insufficient documentation

## 2012-04-13 DIAGNOSIS — Z79899 Other long term (current) drug therapy: Secondary | ICD-10-CM | POA: Insufficient documentation

## 2012-04-13 LAB — CBC WITH DIFFERENTIAL/PLATELET
Eosinophils Absolute: 0.2 10*3/uL (ref 0.0–0.7)
Hemoglobin: 12.2 g/dL — ABNORMAL LOW (ref 13.0–17.0)
Lymphocytes Relative: 16 % (ref 12–46)
Lymphs Abs: 1.1 10*3/uL (ref 0.7–4.0)
MCH: 33.2 pg (ref 26.0–34.0)
Monocytes Relative: 7 % (ref 3–12)
Neutrophils Relative %: 73 % (ref 43–77)
Platelets: 255 10*3/uL (ref 150–400)
RBC: 3.67 MIL/uL — ABNORMAL LOW (ref 4.22–5.81)
WBC: 7.1 10*3/uL (ref 4.0–10.5)

## 2012-04-13 LAB — BASIC METABOLIC PANEL
BUN: 12 mg/dL (ref 6–23)
CO2: 28 mEq/L (ref 19–32)
Chloride: 91 mEq/L — ABNORMAL LOW (ref 96–112)
GFR calc non Af Amer: 80 mL/min — ABNORMAL LOW (ref >90)
Glucose, Bld: 111 mg/dL — ABNORMAL HIGH (ref 70–99)
Potassium: 4 mEq/L (ref 3.5–5.1)
Sodium: 130 mEq/L — ABNORMAL LOW (ref 135–145)

## 2012-04-13 MED ORDER — MECLIZINE HCL 25 MG PO TABS: 25.0000 mg | ORAL_TABLET | Freq: Three times a day (TID) | ORAL | Status: AC | PRN

## 2012-04-13 MED ORDER — MECLIZINE HCL 12.5 MG PO TABS
25.0000 mg | ORAL_TABLET | Freq: Once | ORAL | Status: AC
  Administered 2012-04-13: 25 mg via ORAL
  Filled 2012-04-13: qty 2

## 2012-04-13 NOTE — ED Notes (Signed)
Pt reports having dizziness and lightheaded since last night, has known blood clot to neck --unable to do surgery.  That area is now painful

## 2012-04-13 NOTE — ED Notes (Signed)
Pt taken to Radiology.  

## 2012-04-13 NOTE — ED Notes (Signed)
Patient ambulated around nurse's station with cane, tolerated well. Patient reports improvement in dizziness. EDP aware, watched patient ambulate.

## 2012-04-13 NOTE — ED Provider Notes (Signed)
History    This chart was scribed for Charles B. Bernette Mayers, MD, MD by Smitty Pluck. The patient was seen in room APA18 and the patient's care was started at 10:34AM.   CSN: [id number]  Arrival date & time 04/13/12  1013   First MD Initiated Contact with Patient 04/13/12 1031      Chief Complaint  Patient presents with  . Dizziness    (Consider location/radiation/quality/duration/timing/severity/associated sxs/prior treatment) The history is provided by the patient.   Andre Holder is a 78 y.o. male who presents to the Emergency Department complaining of moderate dizziness onset 1 day ago. Pt reports that he feels like he is spinning. Pt reports that he has mild SOB. He reports being off balance when walking. He has hx of similar symptoms and was in the hospital for vertigo in January. Denies chest pain and weakness in arms. Pt started taking abx 1 day ago for ?tracheostomy infection He has known nonobstructive carotid artery disease. Unknown posterior circulation disease.   Past Medical History  Diagnosis Date  . Arteriosclerotic cardiovascular disease (ASCVD)     Nonobstructive; 09/2008 50% proximal and 40% mid LAD; 25% circumflex; 30% RCA; mild global LV dysfunction with EF of 45%. No aortic stenosis.  . Mild aortic stenosis     not documented at catheterization; verified by echo in 2011  . Peripheral vascular disease     With a 70% innominate artery stenosis and nonobstructive carotid stenosis  . Hypothyroidism   . Degenerative joint disease     s/p bilateral TKR  . Mobitz (type) II atrioventricular block     With bradycardia; Medtronic pacemaker implanted in 09/2008  . Tobacco abuse, in remission     Remote  . GERD (gastroesophageal reflux disease)   . Hyperlipidemia     Lipid profile in 04/2010:115, 98, 43, 52.  . Cancer of larynx     laryngectomy in 1988; postoperative radiation therapy  . Weight loss     50 pounds between 1991 and 2011  . 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 Adrenal hyperplasia     Stable on serial imaging  . Anemia     minimal in 2011 with hemoglobin of 12.2 and high normal MCV  . Borderline hypertension     Normal CMet in 2011    Past Surgical History  Procedure Date  . Laryngectomy 1988    S/P laryngectomy and radiation therapy  . Appendectomy 1973  . Knee arthroscopy     Left  . Total knee arthroplasty     Bilateral, 19 years ago  . Cataract extraction, bilateral   . Decompression facial nerve     Right median  . Pacemaker insertion   . Insert / replace / remove pacemaker     Family History  Problem Relation Age of Onset  . Stroke Mother   . Leukemia Father   . Colon cancer Neg Hx   . Liver disease Neg Hx   . GI problems Neg Hx   . Stroke Other   . Diabetes Other     History  Substance Use Topics  . Smoking status: Former Smoker    Types: Cigarettes  . Smokeless tobacco: Not on file   Comment: Quit 30 years  . Alcohol Use: No      Review of Systems  All other systems reviewed and are negative.   10 Systems reviewed and all are negative for acute  change except as noted in the HPI.   Allergies  Review of patient's allergies indicates no known allergies.  Home Medications   Current Outpatient Rx  Name Route Sig Dispense Refill  . ASPIRIN 81 MG PO TABS Oral Take 1 tablet (81 mg total) by mouth daily. 30 tablet 12  . LEVOTHYROXINE SODIUM 150 MCG PO TABS Oral Take 1 tablet (150 mcg total) by mouth daily before breakfast. 30 tablet 12  . MULTI COMPLETE PO Oral Take by mouth.    Marland Kitchen SIMVASTATIN 40 MG PO TABS Oral Take 40 mg by mouth at bedtime.     . TAMSULOSIN HCL 0.4 MG PO CAPS Oral Take 1 capsule (0.4 mg total) by mouth daily. 30 capsule 12    BP 137/82  Pulse 62  Temp 98.2 F (36.8 C) (Oral)  Resp 18  Ht 5' 6.5" (1.689 m)  Wt 175 lb (79.379 kg)  BMI 27.82 kg/m2  SpO2 97%  Physical Exam  Nursing note and vitals reviewed. Constitutional: He is  oriented to person, place, and time. He appears well-developed and well-nourished.  HENT:  Head: Normocephalic and atraumatic.  Eyes: EOM are normal. Pupils are equal, round, and reactive to light.  Neck: Normal range of motion. Neck supple.  Cardiovascular: Normal rate and intact distal pulses.   Murmur heard.  Systolic murmur is present  Pulmonary/Chest: Effort normal and breath sounds normal.  Abdominal: Bowel sounds are normal. He exhibits no distension. There is no tenderness.  Musculoskeletal: Normal range of motion. He exhibits no edema and no tenderness.  Neurological: He is alert and oriented to person, place, and time. He has normal strength. No cranial nerve deficit or sensory deficit. Coordination normal.  Skin: Skin is warm and dry. No rash noted.  Psychiatric: He has a normal mood and affect.    ED Course  Procedures (including critical care time) DIAGNOSTIC STUDIES: Oxygen Saturation is 97% on room air, normal by my interpretation.    COORDINATION OF CARE: 10:43AM EDP discusses pt ED treatment with pt  10:45AM EDP ordered medication: Scheduled Meds:   . meclizine  25 mg Oral Once   Continuous Infusions:  PRN Meds:.     Labs Reviewed  CBC WITH DIFFERENTIAL - Abnormal; Notable for the following:    RBC 3.67 (*)     Hemoglobin 12.2 (*)     HCT 34.7 (*)     All other components within normal limits  BASIC METABOLIC PANEL - Abnormal; Notable for the following:    Sodium 130 (*)     Chloride 91 (*)     Glucose, Bld 111 (*)     GFR calc non Af Amer 80 (*)     All other components within normal limits  TROPONIN I   Dg Chest 2 View  04/13/2012  *RADIOLOGY REPORT*  Clinical Data: Dizziness, shortness of breath.  CHEST - 2 VIEW  Comparison: 12/12/2011  Findings: Left pacer remains in place, unchanged.  Elevation of the left hemidiaphragm.  Mild cardiomegaly.  No confluent airspace opacities or effusions.  Degenerative changes in the thoracic spine.  IMPRESSION:  Mild cardiomegaly.  Stable elevation of the left hemidiaphragm.  No acute findings.  Original Report Authenticated By: Cyndie Chime, M.D.   Ct Head Wo Contrast  04/13/2012  *RADIOLOGY REPORT*  Clinical Data: Dizziness.  History of laryngeal carcinoma.  CT HEAD WITHOUT CONTRAST  Technique:  Contiguous axial images were obtained from the base of the skull through the vertex without contrast.  Comparison: 12/12/2011.  Findings: No intracranial hemorrhage.  Remote posterior right frontal lobe infarct with encephalomalacia.  Remote small right lenticular nucleus infarct.  Prominent small vessel disease type changes.  No CT evidence of large acute infarct.  No intracranial mass lesion detected on this unenhanced exam.  Global atrophy without hydrocephalus.  Scattered nonspecific calcifications stable.  Ethmoid sinus air cells and maxillary sinus mucosal thickening.  Vascular calcifications.  IMPRESSION: No intracranial hemorrhage or CT evidence of large acute infarct.  Remote infarcts and prominent small vessel disease type changes as noted above.  Ethmoid sinus air cell and maxillary sinus mucosal thickening.  Original Report Authenticated By: Fuller Canada, M.D.     No diagnosis found.    MDM   Date: 04/13/2012  Rate: 65  Rhythm: normal sinus rhythm  QRS Axis: left  Intervals: normal  ST/T Wave abnormalities: nonspecific T wave changes  Conduction Disutrbances:nonspecific intraventricular conduction delay  Narrative Interpretation:   Old EKG Reviewed: unchanged    I personally performed the services described in the documentation, which were scribed in my presence. The recorded information has been reviewed and considered.   Labs and imaging unremarkable. Pt feeling better while lying in bed. Will check gait.   12:41 PM Pt able to ambulate without dizziness and has a steady gait. Advised to take Meclizine as needed for dizziness. Doubt this is a central vertigo. Can followup as an  outpatient.      Charles B. Bernette Mayers, MD 04/13/12 1242

## 2012-05-03 ENCOUNTER — Emergency Department (HOSPITAL_COMMUNITY)
Admission: EM | Admit: 2012-05-03 | Discharge: 2012-05-03 | Disposition: A | Discharge status: Home / Self Care | Payer: Non-veteran care | Attending: Emergency Medicine | Admitting: Emergency Medicine

## 2012-05-03 ENCOUNTER — Encounter (HOSPITAL_COMMUNITY): Payer: Self-pay | Admitting: Emergency Medicine

## 2012-05-03 DIAGNOSIS — M1990 Unspecified osteoarthritis, unspecified site: Secondary | ICD-10-CM | POA: Insufficient documentation

## 2012-05-03 DIAGNOSIS — E860 Dehydration: Secondary | ICD-10-CM

## 2012-05-03 DIAGNOSIS — E039 Hypothyroidism, unspecified: Secondary | ICD-10-CM | POA: Insufficient documentation

## 2012-05-03 DIAGNOSIS — E871 Hypo-osmolality and hyponatremia: Secondary | ICD-10-CM | POA: Insufficient documentation

## 2012-05-03 DIAGNOSIS — Z87891 Personal history of nicotine dependence: Secondary | ICD-10-CM | POA: Insufficient documentation

## 2012-05-03 DIAGNOSIS — E785 Hyperlipidemia, unspecified: Secondary | ICD-10-CM | POA: Insufficient documentation

## 2012-05-03 DIAGNOSIS — K219 Gastro-esophageal reflux disease without esophagitis: Secondary | ICD-10-CM | POA: Insufficient documentation

## 2012-05-03 DIAGNOSIS — D649 Anemia, unspecified: Secondary | ICD-10-CM | POA: Insufficient documentation

## 2012-05-03 DIAGNOSIS — E878 Other disorders of electrolyte and fluid balance, not elsewhere classified: Secondary | ICD-10-CM

## 2012-05-03 LAB — CBC WITH DIFFERENTIAL/PLATELET
Basophils Absolute: 0 10*3/uL (ref 0.0–0.1)
Basophils Relative: 1 % (ref 0–1)
Eosinophils Relative: 4 % (ref 0–5)
Lymphocytes Relative: 18 % (ref 12–46)
MCHC: 35 g/dL (ref 30.0–36.0)
MCV: 94.7 fL (ref 78.0–100.0)
Platelets: 230 10*3/uL (ref 150–400)
RDW: 13.4 % (ref 11.5–15.5)
WBC: 5.9 10*3/uL (ref 4.0–10.5)

## 2012-05-03 LAB — COMPREHENSIVE METABOLIC PANEL
ALT: 20 U/L (ref 0–53)
AST: 28 U/L (ref 0–37)
Albumin: 3.4 g/dL — ABNORMAL LOW (ref 3.5–5.2)
CO2: 29 mEq/L (ref 19–32)
Calcium: 9.5 mg/dL (ref 8.4–10.5)
GFR calc non Af Amer: 82 mL/min — ABNORMAL LOW (ref >90)
Sodium: 127 mEq/L — ABNORMAL LOW (ref 135–145)
Total Protein: 6.5 g/dL (ref 6.0–8.3)

## 2012-05-03 MED ORDER — SODIUM CHLORIDE 0.9 % IV BOLUS (SEPSIS)
500.0000 mL | Freq: Once | INTRAVENOUS | Status: AC
  Administered 2012-05-03: 500 mL via INTRAVENOUS

## 2012-05-03 MED ORDER — ONDANSETRON HCL 4 MG/2ML IJ SOLN
4.0000 mg | Freq: Once | INTRAMUSCULAR | Status: AC
  Administered 2012-05-03: 4 mg via INTRAVENOUS
  Filled 2012-05-03: qty 2

## 2012-05-03 MED ORDER — SODIUM CHLORIDE 0.9 % IV SOLN: INTRAVENOUS | Status: DC

## 2012-05-03 MED ORDER — SODIUM CHLORIDE 0.9 % IV BOLUS (SEPSIS): 500.0000 mL | Freq: Once | INTRAVENOUS | Status: DC

## 2012-05-03 NOTE — ED Provider Notes (Signed)
History  This chart was scribed for No att. providers found by Andre Holder. The patient was seen in room APA11/APA11. [name] was started at 1012.     CSN: [id number]  Arrival date & time 05/03/12  1012   First MD Initiated Contact with Patient 05/03/12 1032      Chief Complaint  Patient presents with  . Weakness  . Headache    Patient is a 78 y.o. male presenting with headaches. The history is provided by the patient and the spouse. No language interpreter was used.  Headache  This is a new problem. The current episode started yesterday. The problem occurs hourly. The problem has not changed since onset.The headache is associated with nothing. Pain location: Neck pain that radiates up to his head behins ears. The pain is mild. Associated symptoms include nausea.   Andre Holder is a 78 y.o. male who presents to the Emergency Department complaining of intermittent, posterior neck pain that radiates up to his head particularly behind the ears. The pain began 1 day ago, but he has had it off and on for about 3 weeks. Patient says that the pain lasts a few seconds and the episodes are 2-3 hours apart. The pain is mild in severity and dull in quality. There is associated nausea. Patient also says that his appetite has been decreased lately. No vomiting. No visual disturbance.  No numbness or tingling in lower extremities. Patient says that he has had this pain before and he came to the hospital today because his physician instructed him to seek evaluation if pain continued to rule out a stroke. He has been having pains like this for a few weeks. [name], but he is having more difficulty. He now uses a walker.  Patient had skin resection on the right side of his neck secondary to skin cancer removal at the [facility] hospital 6 days ago. Wife says that she doesn't know what character the cancer cells are.  Patient has been evaluated and has carotid artery blockages, but is not a  surgical candidate. He has nonobstructive arteriosclerotic cardiovascular disease, mild aortic stenosis, and peripheral vascular disease. Patient has a pacemaker. His medical history also includes GERD, hyperlipidemia, hyperplasia and anemia. [name], radio. He had a laryngectomy in 1988 secondary to cancer of larynx.  [facility] - [facility] - [facility], [place]   Past Medical History  Diagnosis Date  . Arteriosclerotic cardiovascular disease (ASCVD)     Nonobstructive; 09/2008 50% proximal and 40% mid LAD; 25% circumflex; 30% RCA; mild global LV dysfunction with EF of 45%. No aortic stenosis.  . Mild aortic stenosis     not documented at catheterization; verified by echo in 2011  . Peripheral vascular disease     With a 70% innominate artery stenosis and nonobstructive carotid stenosis  . Hypothyroidism   . Degenerative joint disease     s/p bilateral TKR  . Mobitz (type) II atrioventricular block     With bradycardia; Medtronic pacemaker implanted in 09/2008  . Tobacco abuse, in remission     Remote  . GERD (gastroesophageal reflux disease)   . Hyperlipidemia     Lipid profile in 04/2010:115, 98, 43, 52.  . Cancer of larynx     laryngectomy in 1988; postoperative radiation therapy  . Weight loss     50 pounds between 1991 and 2011  . 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  Adrenal hyperplasia     Stable on serial imaging  . Anemia     minimal in 2011 with hemoglobin of 12.2 and high normal MCV  . Borderline hypertension     Normal CMet in 2011    Past Surgical History  Procedure Date  . Laryngectomy 1988    S/P laryngectomy and radiation therapy  . Appendectomy 1973  . Knee arthroscopy     Left  . Total knee arthroplasty     Bilateral, 19 years ago  . Cataract extraction, bilateral   . Decompression facial nerve     Right median  . Pacemaker insertion   . Insert / replace / remove pacemaker     Family History    Problem Relation Age of Onset  . Stroke Mother   . Leukemia Father   . Colon cancer Neg Hx   . Liver disease Neg Hx   . GI problems Neg Hx   . Stroke Other   . Diabetes Other     History  Substance Use Topics  . Smoking status: Former Smoker    Types: Cigarettes  . Smokeless tobacco: Not on file   Comment: Quit 30 years  . Alcohol Use: No  Lives at home Lives with spouse   Review of Systems  HENT: Positive for neck pain.   Gastrointestinal: Positive for nausea.  Neurological: Positive for headaches.  All other systems reviewed and are negative.    Allergies  Review of patient's allergies indicates no known allergies.  Home Medications   Current Outpatient Rx  Name Route Sig Dispense Refill  . AMOXICILLIN-POT CLAVULANATE 875-125 MG PO TABS Oral Take 1 tablet by mouth 2 (two) times daily.    . OMEGA-3 FATTY ACIDS 1000 MG PO CAPS Oral Take 1 g by mouth daily.    Marland Kitchen LEVOTHYROXINE SODIUM 125 MCG PO TABS Oral Take 125 mcg by mouth daily.    Marland Kitchen MAGNESIUM PO Oral Take 1 tablet by mouth daily.    Malachy Mood COMPLETE PO Oral Take 1 tablet by mouth daily.       BP 177/78  Pulse 65  Resp 18  Ht 6\' 2"  (1.88 m)  Wt 183 lb (83.008 kg)  BMI 23.50 kg/m2  SpO2 97%  Vital signs normal    Physical Exam  Nursing note and vitals reviewed. Constitutional: He is oriented to person, place, and time. He appears well-developed and well-nourished.  Non-toxic appearance. He does not appear ill. No distress.  HENT:  Head: Normocephalic and atraumatic.  Right Ear: External ear normal.  Left Ear: External ear normal.  Nose: Nose normal. No mucosal edema or rhinorrhea.  Mouth/Throat: Mucous membranes are normal. No dental abscesses or uvula swelling.       [name], radio.  Eyes: Conjunctivae and EOM are normal. Pupils are equal, round, and reactive to light.  Neck: Normal range of motion and full passive range of motion without pain. Neck supple.       Right  radical resection, well-healed. Has trach. There is a small incision 1 cm long in the lateral mid neck that has  sutures in place, does not look infected.   Cardiovascular: Normal rate, regular rhythm and normal heart sounds.  Exam reveals no gallop and no friction rub.   No murmur heard. Pulmonary/Chest: Effort normal and breath sounds normal. No respiratory distress. He has no wheezes. He has no rhonchi. He has no rales. He exhibits no tenderness and no crepitus.  Abdominal:  Soft. Normal appearance and bowel sounds are normal. He exhibits no distension. There is no tenderness. There is no rebound and no guarding.  Musculoskeletal: Normal range of motion. He exhibits no edema and no tenderness.       Moves all extremities well.   Neurological: He is alert and oriented to person, place, and time. He has normal strength. No cranial nerve deficit.  Skin: Skin is warm, dry and intact. No rash noted. No erythema. No pallor.  Psychiatric: He has a normal mood and affect. His speech is normal and behavior is normal. His mood appears not anxious.    ED Course  Procedures (including critical care time)   Medications  0.9 %  sodium chloride infusion (0  Intravenous Stopped 05/03/12 1348)  sodium chloride 0.9 % bolus 500 mL (500 mL Intravenous Not Given 05/03/12 1348)  ondansetron (ZOFRAN) injection 4 mg (4 mg Intravenous Given 05/03/12 1117)  sodium chloride 0.9 % bolus 500 mL (500 mL Intravenous Given 05/03/12 1116)    DIAGNOSTIC STUDIES: Oxygen Saturation is 97% on room air, adequate by my interpretation.    COORDINATION OF CARE: 11:45am- Patient informed of current plan for treatment and evaluation and agrees with plan at this time.  12:47pm- Pt now states he is having dizziness, concerned about back pain which his wife states is old and he has been seeing a doctor about it for 4 years.   Pt feels ready to go home after his IV fluids. He was given zofran for his nausea.   Results for orders  placed during the hospital encounter of 05/03/12  CBC WITH DIFFERENTIAL      Component Value Range   WBC 5.9  4.0 - 10.5 K/uL   RBC 3.41 (*) 4.22 - 5.81 MIL/uL   Hemoglobin 11.3 (*) 13.0 - 17.0 g/dL   HCT 78.2 (*) 95.6 - 21.3 %   MCV 94.7  78.0 - 100.0 fL   MCH 33.1  26.0 - 34.0 pg   MCHC 35.0  30.0 - 36.0 g/dL   RDW 08.6  57.8 - 46.9 %   Platelets 230  150 - 400 K/uL   Neutrophils Relative 67  43 - 77 %   Neutro Abs 4.0  1.7 - 7.7 K/uL   Lymphocytes Relative 18  12 - 46 %   Lymphs Abs 1.1  0.7 - 4.0 K/uL   Monocytes Relative 10  3 - 12 %   Monocytes Absolute 0.6  0.1 - 1.0 K/uL   Eosinophils Relative 4  0 - 5 %   Eosinophils Absolute 0.3  0.0 - 0.7 K/uL   Basophils Relative 1  0 - 1 %   Basophils Absolute 0.0  0.0 - 0.1 K/uL  COMPREHENSIVE METABOLIC PANEL      Component Value Range   Sodium 127 (*) 135 - 145 mEq/L   Potassium 4.4  3.5 - 5.1 mEq/L   Chloride 92 (*) 96 - 112 mEq/L   CO2 29  19 - 32 mEq/L   Glucose, Bld 91  70 - 99 mg/dL   BUN 14  6 - 23 mg/dL   Creatinine, Ser 6.29  0.50 - 1.35 mg/dL   Calcium 9.5  8.4 - 52.8 mg/dL   Total Protein 6.5  6.0 - 8.3 g/dL   Albumin 3.4 (*) 3.5 - 5.2 g/dL   AST 28  0 - 37 U/L   ALT 20  0 - 53 U/L   Alkaline Phosphatase 84  39 - 117 U/L  Total Bilirubin 0.3  0.3 - 1.2 mg/dL   GFR calc non Af Amer 82 (*) >90 mL/min   GFR calc Af Amer >90  >90 mL/min   Laboratory interpretation all normal except chronic hyponatremia, low chloride, mild anemia   Date: 05/03/2012  Rate: 60  Rhythm: electronic pacemaker  QRS Axis: NA  Intervals: NA  ST/T Wave abnormalities: NA  Conduction Disutrbances:NA  Narrative Interpretation:   Old EKG Reviewed: unchanged from 04/13/2012     1. Dehydration   2. Hyponatremia   3. Serum chloride decreased     Plan discharge  Devoria Albe, MD, FACEP   MDM    I personally performed the services described in this documentation, which was scribed in my presence. The recorded information has been  reviewed and considered.  Devoria Albe, MD, Armando Gang    Ward Givens, MD 05/03/12 [phone]

## 2012-05-03 NOTE — ED Notes (Signed)
Patient with no complaints at this time. Respirations even and unlabored. Skin warm/dry. Discharge instructions reviewed with patient at this time. Patient given opportunity to voice concerns/ask questions. IV removed per policy and band-aid applied to site. Patient discharged at this time and left Emergency Department with steady gait.  

## 2012-05-03 NOTE — ED Notes (Signed)
Pt complaining of lightheadedness with headache since 7am. Pt states had clogged carotid arteries that may be causing symptoms.

## 2012-05-14 ENCOUNTER — Encounter (HOSPITAL_COMMUNITY): Payer: Self-pay | Admitting: Emergency Medicine

## 2012-05-14 ENCOUNTER — Inpatient Hospital Stay (HOSPITAL_COMMUNITY)
Admission: EM | Admit: 2012-05-14 | Discharge: 2012-05-17 | DRG: 918 | Disposition: A | Discharge status: Other Acute Inpatient Hospital | Payer: Non-veteran care | Attending: Internal Medicine | Admitting: Internal Medicine

## 2012-05-14 DIAGNOSIS — Z96659 Presence of unspecified artificial knee joint: Secondary | ICD-10-CM

## 2012-05-14 DIAGNOSIS — M1990 Unspecified osteoarthritis, unspecified site: Secondary | ICD-10-CM | POA: Diagnosis present

## 2012-05-14 DIAGNOSIS — N400 Enlarged prostate without lower urinary tract symptoms: Secondary | ICD-10-CM | POA: Diagnosis present

## 2012-05-14 DIAGNOSIS — T6391XA Toxic effect of contact with unspecified venomous animal, accidental (unintentional), initial encounter: Principal | ICD-10-CM | POA: Diagnosis present

## 2012-05-14 DIAGNOSIS — I359 Nonrheumatic aortic valve disorder, unspecified: Secondary | ICD-10-CM | POA: Diagnosis present

## 2012-05-14 DIAGNOSIS — E785 Hyperlipidemia, unspecified: Secondary | ICD-10-CM | POA: Diagnosis present

## 2012-05-14 DIAGNOSIS — I96 Gangrene, not elsewhere classified: Secondary | ICD-10-CM | POA: Diagnosis not present

## 2012-05-14 DIAGNOSIS — K219 Gastro-esophageal reflux disease without esophagitis: Secondary | ICD-10-CM | POA: Diagnosis present

## 2012-05-14 DIAGNOSIS — I2510 Atherosclerotic heart disease of native coronary artery without angina pectoris: Secondary | ICD-10-CM | POA: Diagnosis present

## 2012-05-14 DIAGNOSIS — Z9089 Acquired absence of other organs: Secondary | ICD-10-CM

## 2012-05-14 DIAGNOSIS — Z87891 Personal history of nicotine dependence: Secondary | ICD-10-CM

## 2012-05-14 DIAGNOSIS — E278 Other specified disorders of adrenal gland: Secondary | ICD-10-CM | POA: Diagnosis present

## 2012-05-14 DIAGNOSIS — Y93H9 Activity, other involving exterior property and land maintenance, building and construction: Secondary | ICD-10-CM

## 2012-05-14 DIAGNOSIS — Z950 Presence of cardiac pacemaker: Secondary | ICD-10-CM

## 2012-05-14 DIAGNOSIS — Z930 Tracheostomy status: Secondary | ICD-10-CM

## 2012-05-14 DIAGNOSIS — T63001A Toxic effect of unspecified snake venom, accidental (unintentional), initial encounter: Secondary | ICD-10-CM | POA: Diagnosis present

## 2012-05-14 DIAGNOSIS — Z7982 Long term (current) use of aspirin: Secondary | ICD-10-CM

## 2012-05-14 DIAGNOSIS — R011 Cardiac murmur, unspecified: Secondary | ICD-10-CM | POA: Diagnosis present

## 2012-05-14 DIAGNOSIS — E039 Hypothyroidism, unspecified: Secondary | ICD-10-CM | POA: Diagnosis present

## 2012-05-14 DIAGNOSIS — Z923 Personal history of irradiation: Secondary | ICD-10-CM

## 2012-05-14 DIAGNOSIS — I10 Essential (primary) hypertension: Secondary | ICD-10-CM | POA: Diagnosis present

## 2012-05-14 DIAGNOSIS — I779 Disorder of arteries and arterioles, unspecified: Secondary | ICD-10-CM | POA: Diagnosis present

## 2012-05-14 DIAGNOSIS — Z8521 Personal history of malignant neoplasm of larynx: Secondary | ICD-10-CM

## 2012-05-14 DIAGNOSIS — G609 Hereditary and idiopathic neuropathy, unspecified: Secondary | ICD-10-CM | POA: Diagnosis present

## 2012-05-14 LAB — PROTIME-INR
INR: 1.07 (ref 0.00–1.49)
INR: 1.16 (ref 0.00–1.49)
Prothrombin Time: 14.1 seconds (ref 11.6–15.2)
Prothrombin Time: 14.5 seconds (ref 11.6–15.2)

## 2012-05-14 LAB — COMPREHENSIVE METABOLIC PANEL
Albumin: 3.8 g/dL (ref 3.5–5.2)
Alkaline Phosphatase: 106 U/L (ref 39–117)
BUN: 25 mg/dL — ABNORMAL HIGH (ref 6–23)
Creatinine, Ser: 0.79 mg/dL (ref 0.50–1.35)
GFR calc Af Amer: >90 mL/min (ref >90)
Glucose, Bld: 106 mg/dL — ABNORMAL HIGH (ref 70–99)
Total Bilirubin: 0.3 mg/dL (ref 0.3–1.2)
Total Protein: 7 g/dL (ref 6.0–8.3)

## 2012-05-14 LAB — CBC WITH DIFFERENTIAL/PLATELET
Basophils Relative: 1 % (ref 0–1)
Basophils Relative: 1 % (ref 0–1)
Eosinophils Absolute: 0.4 10*3/uL (ref 0.0–0.7)
Eosinophils Absolute: 0.1 10*3/uL (ref 0.0–0.7)
HCT: 32.1 % — ABNORMAL LOW (ref 39.0–52.0)
Hemoglobin: 11.4 g/dL — ABNORMAL LOW (ref 13.0–17.0)
Lymphocytes Relative: 8 % — ABNORMAL LOW (ref 12–46)
Lymphs Abs: 1.6 10*3/uL (ref 0.7–4.0)
Lymphs Abs: 1.8 10*3/uL (ref 0.7–4.0)
Lymphs Abs: 1.1 10*3/uL (ref 0.7–4.0)
MCH: 34 pg (ref 26.0–34.0)
MCH: 34.4 pg — ABNORMAL HIGH (ref 26.0–34.0)
MCHC: 35.5 g/dL (ref 30.0–36.0)
MCV: 95.8 fL (ref 78.0–100.0)
MCV: 95.5 fL (ref 78.0–100.0)
Monocytes Absolute: 0.7 10*3/uL (ref 0.1–1.0)
Monocytes Absolute: 0.6 10*3/uL (ref 0.1–1.0)
Monocytes Relative: 10 % (ref 3–12)
Neutro Abs: 11.9 10*3/uL — ABNORMAL HIGH (ref 1.7–7.7)
Neutrophils Relative %: 73 % (ref 43–77)
Neutrophils Relative %: 85 % — ABNORMAL HIGH (ref 43–77)
Platelets: 223 10*3/uL (ref 150–400)
Platelets: 222 10*3/uL (ref 150–400)
RBC: 3.35 MIL/uL — ABNORMAL LOW (ref 4.22–5.81)
RBC: 3.55 MIL/uL — ABNORMAL LOW (ref 4.22–5.81)
RBC: 3.52 MIL/uL — ABNORMAL LOW (ref 4.22–5.81)
RDW: 13.6 % (ref 11.5–15.5)
WBC: 10.6 10*3/uL — ABNORMAL HIGH (ref 4.0–10.5)
WBC: 14 10*3/uL — ABNORMAL HIGH (ref 4.0–10.5)

## 2012-05-14 MED ORDER — HYDROMORPHONE HCL PF 1 MG/ML IJ SOLN
1.0000 mg | INTRAMUSCULAR | Status: DC | PRN
  Administered 2012-05-14 – 2012-05-17 (×13): 1 mg via INTRAVENOUS
  Filled 2012-05-14 (×14): qty 1

## 2012-05-14 MED ORDER — TETANUS-DIPHTHERIA TOXOIDS TD 5-2 LFU IM INJ
0.5000 mL | INJECTION | Freq: Once | INTRAMUSCULAR | Status: AC
  Administered 2012-05-14: 0.5 mL via INTRAMUSCULAR
  Filled 2012-05-14: qty 0.5

## 2012-05-14 MED ORDER — FENTANYL CITRATE 0.05 MG/ML IJ SOLN
50.0000 ug | Freq: Once | INTRAMUSCULAR | Status: AC
  Administered 2012-05-14: 50 ug via INTRAVENOUS
  Filled 2012-05-14 (×2): qty 2

## 2012-05-14 MED ORDER — FINASTERIDE 5 MG PO TABS
5.0000 mg | ORAL_TABLET | Freq: Every day | ORAL | Status: DC
  Administered 2012-05-14 – 2012-05-17 (×4): 5 mg via ORAL
  Filled 2012-05-14 (×6): qty 1

## 2012-05-14 MED ORDER — TETANUS-DIPHTH-ACELL PERTUSSIS 5-2.5-18.5 LF-MCG/0.5 IM SUSP: 0.5000 mL | Freq: Once | INTRAMUSCULAR | Status: DC

## 2012-05-14 MED ORDER — ENOXAPARIN SODIUM 40 MG/0.4ML ~~LOC~~ SOLN
40.0000 mg | SUBCUTANEOUS | Status: DC
  Administered 2012-05-14 – 2012-05-16 (×3): 40 mg via SUBCUTANEOUS
  Filled 2012-05-14 (×3): qty 0.4

## 2012-05-14 MED ORDER — SODIUM CHLORIDE 0.9 % IV SOLN
4.0000 | Freq: Once | INTRAVENOUS | Status: AC
  Administered 2012-05-14: 40 mL via INTRAVENOUS
  Filled 2012-05-14: qty 40

## 2012-05-14 MED ORDER — HYDROMORPHONE HCL PF 1 MG/ML IJ SOLN
1.0000 mg | Freq: Once | INTRAMUSCULAR | Status: AC
  Administered 2012-05-14: 1 mg via INTRAVENOUS
  Filled 2012-05-14: qty 1

## 2012-05-14 MED ORDER — MECLIZINE HCL 12.5 MG PO TABS: 25.0000 mg | ORAL_TABLET | Freq: Three times a day (TID) | ORAL | Status: DC | PRN

## 2012-05-14 MED ORDER — LEVOTHYROXINE SODIUM 25 MCG PO TABS
125.0000 ug | ORAL_TABLET | Freq: Every day | ORAL | Status: DC
  Administered 2012-05-15 – 2012-05-17 (×3): 125 ug via ORAL
  Filled 2012-05-14 (×3): qty 1

## 2012-05-14 NOTE — Progress Notes (Signed)
Report called to C. Vear Clock RN in ICU, pt. Taken to ICU via bed. Right arm continues to be elevated above heart and straight on 6 pillows.

## 2012-05-14 NOTE — Progress Notes (Signed)
Swelling in right arm continues to move up arm, site marked, pt., BP elevated, bite marks more bruised and purple looking, poison control called, given update on lab results initial and 1617 results, poison control recommends treatment with Crofrab. Orders entered in computer.

## 2012-05-14 NOTE — ED Provider Notes (Signed)
History    This chart was scribed for Joya Gaskins, MD, MD by Smitty Pluck. The patient was seen in room APA05 and the patient's care was started at 11:54AM.   CSN: [id number]  Arrival date & time 05/14/12  1144   First MD Initiated Contact with Patient 05/14/12 1154      Chief Complaint  Patient presents with  . Snake Bite     The history is provided by the patient. No language interpreter was used.   Andre Holder is a 78 y.o. male who presents to the Emergency Department complaining of snake bite on right middle finger onset 1 hour ago. Pt reports pain with movement of right middle finger. Reports that the snake was a copperhead snake. Denies dizziness, nausea, vomiting and any other pain currently.  He was otherwise well prior to bite.  He has no chest pain.  He is s/p laryngectomy  Past Medical History  Diagnosis Date  . Arteriosclerotic cardiovascular disease (ASCVD)     Nonobstructive; 09/2008 50% proximal and 40% mid LAD; 25% circumflex; 30% RCA; mild global LV dysfunction with EF of 45%. No aortic stenosis.  . Mild aortic stenosis     not documented at catheterization; verified by echo in 2011  . Peripheral vascular disease     With a 70% innominate artery stenosis and nonobstructive carotid stenosis  . Hypothyroidism   . Degenerative joint disease     s/p bilateral TKR  . Mobitz (type) II atrioventricular block     With bradycardia; Medtronic pacemaker implanted in 09/2008  . Tobacco abuse, in remission     Remote  . GERD (gastroesophageal reflux disease)   . Hyperlipidemia     Lipid profile in 04/2010:115, 98, 43, 52.  . Cancer of larynx     laryngectomy in 1988; postoperative radiation therapy  . Weight loss     50 pounds between 1991 and 2011  . 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 Adrenal hyperplasia     Stable on serial imaging  . Anemia     minimal in 2011 with hemoglobin of 12.2 and high normal MCV  .  Borderline hypertension     Normal CMet in 2011    Past Surgical History  Procedure Date  . Laryngectomy 1988    S/P laryngectomy and radiation therapy  . Appendectomy 1973  . Knee arthroscopy     Left  . Total knee arthroplasty     Bilateral, 19 years ago  . Cataract extraction, bilateral   . Decompression facial nerve     Right median  . Pacemaker insertion   . Insert / replace / remove pacemaker     Family History  Problem Relation Age of Onset  . Stroke Mother   . Leukemia Father   . Colon cancer Neg Hx   . Liver disease Neg Hx   . GI problems Neg Hx   . Stroke Other   . Diabetes Other     History  Substance Use Topics  . Smoking status: Former Smoker    Types: Cigarettes  . Smokeless tobacco: Not on file   Comment: Quit 30 years  . Alcohol Use: No      Review of Systems  Constitutional: Negative for fever.  Respiratory: Negative for shortness of breath.   Neurological: Negative for weakness.  All other systems reviewed and are negative.  10 Systems reviewed and all are negative  for acute change except as noted in the HPI.    Allergies  Review of patient's allergies indicates no known allergies.  Home Medications   Current Outpatient Rx  Name Route Sig Dispense Refill  . AMOXICILLIN-POT CLAVULANATE 875-125 MG PO TABS Oral Take 1 tablet by mouth 2 (two) times daily.    Marland Kitchen FINASTERIDE 5 MG PO TABS Oral Take 5 mg by mouth at bedtime.    . OMEGA-3 FATTY ACIDS 1000 MG PO CAPS Oral Take 1 g by mouth daily.    Marland Kitchen LEVOTHYROXINE SODIUM 125 MCG PO TABS Oral Take 125 mcg by mouth at bedtime.     Marland Kitchen MAGNESIUM PO Oral Take 1 tablet by mouth daily.    Malachy Mood COMPLETE PO Oral Take 1 tablet by mouth daily.       BP 156/73  Pulse 94  Temp 98.7 F (37.1 C) (Oral)  Resp 21  SpO2 97%  Physical Exam  Nursing note and vitals reviewed.  CONSTITUTIONAL: Well developed/well nourished HEAD AND FACE: Normocephalic/atraumatic EYES: EOMI ENMT: Mucous membranes  moist NECK: supple no meningeal signs, laryngectomy site noted, no erythema or drainage noted CV: S1/S2 noted, no murmurs/rubs/gallops noted LUNGS: Lungs are clear to auscultation bilaterally, no apparent distress ABDOMEN: soft, nontender, no rebound or guarding NEURO: Pt is awake/alert, moves all extremitiesx4 EXTREMITIES: pulses normal, full ROM, Swelling and tenderness to right middle finger, Distal cap refill is less than 2 seconds, Sensation intact.  He can flex the finger at this time.  There is no erythema/edema noted to left arm.  No large wounds noted.  There is no skin color change SKIN: warm, color normal PSYCH: no abnormalities of mood noted  ED Course  Procedures  DIAGNOSTIC STUDIES: Oxygen Saturation is 97% on room air, normal by my interpretation.    COORDINATION OF CARE: 11:57 AM Discussed pt ED treatment with pt  12:17 PM Ordered:   Medications  HYDROcodone-acetaminophen (VICODIN) 5-500 MG per tablet (not administered)  meclizine (ANTIVERT) 25 MG tablet (not administered)  HYDROmorphone (DILAUDID) injection 1 mg (not administered)  HYDROmorphone (DILAUDID) injection 1 mg (1 mg Intravenous Given 05/14/12 1202)  tetanus & diphtheria toxoids (adult) (TENIVAC) injection 0.5 mL (0.5 mL Intramuscular Given 05/14/12 1217)   1:39 PM Pt with worsened edema and pain to his right hand.  There is no cyanosis noted to the hand   Distal cap refill less than 2 seconds but he is exquisitely tender.   At this point does not qualify for crofab but given the edema/pain is worsening, will admit for further monitoring Pt has no constituional symptoms (denies headache/vomiting/weakness/dizziness)  2:00 PM D/w dr Renard Matter, he will admit patient.  I informed him that currently patient does not meet criteria to receive crofab but will need further monitoring and pain control.  Pharmacy can given recommendations on when to use crofab     MDM  Nursing notes including past medical history and  social history reviewed and considered in documentation Labs/vital reviewed and considered       I personally performed the services described in this documentation, which was scribed in my presence. The recorded information has been reviewed and considered.      Joya Gaskins, MD 05/14/12 1401

## 2012-05-14 NOTE — ED Notes (Signed)
Patient' hand cleaned with normal saline.

## 2012-05-14 NOTE — ED Notes (Signed)
Mark site at swelling. Wrist measured at 7 1/8 inch at present

## 2012-05-14 NOTE — Progress Notes (Addendum)
Dr. Felecia Shelling notified of pt.'s swelling worse, poison control called, recommendation of Crofrab treatment, and of pt. Being transferred to ICU to receive Crofrab.

## 2012-05-14 NOTE — ED Notes (Signed)
Advised per Dr. Bebe Shaggy to call poison control to get advisement on snack bite treatment.

## 2012-05-14 NOTE — Progress Notes (Signed)
Dr. Renard Matter in to see pt., aware of poison control being involved, pt. Just having pain medication, and swelling going up in right arm. Orders received from Dr. Renard Matter to call poison control if swelling continues to get worse, call pharmacy to get Crofrab, and transfer to ICU.

## 2012-05-14 NOTE — ED Notes (Signed)
Pt called out for "difficulty breathing" after Dilaudid given.  Pt was assisted up in bed while keeping hand elevated.  Pt given oxygen by non-re breather.  Sats improved to 98% and pt reported ease in breathing.

## 2012-05-14 NOTE — ED Notes (Addendum)
Swelling has increased to just under wrist. EDP notified

## 2012-05-14 NOTE — ED Notes (Addendum)
Pt bit by copperhead snake approx 10 mins ago on right index finger/

## 2012-05-15 LAB — CBC WITH DIFFERENTIAL/PLATELET
Eosinophils Relative: 0 % (ref 0–5)
HCT: 32.7 % — ABNORMAL LOW (ref 39.0–52.0)
Lymphocytes Relative: 7 % — ABNORMAL LOW (ref 12–46)
Lymphs Abs: 0.9 10*3/uL (ref 0.7–4.0)
MCV: 96.5 fL (ref 78.0–100.0)
Platelets: 210 10*3/uL (ref 150–400)
RBC: 3.39 MIL/uL — ABNORMAL LOW (ref 4.22–5.81)
WBC: 12.2 10*3/uL — ABNORMAL HIGH (ref 4.0–10.5)

## 2012-05-15 LAB — PROTIME-INR: Prothrombin Time: 14.8 seconds (ref 11.6–15.2)

## 2012-05-15 MED ORDER — SODIUM CHLORIDE 0.9 % IN NEBU
INHALATION_SOLUTION | RESPIRATORY_TRACT | Status: AC
  Filled 2012-05-15: qty 3

## 2012-05-15 NOTE — Progress Notes (Signed)
1535  Patient eating bag of potato chips and drinking cola, he had requested help with cleaning of his stoma and suctioning of airway. Patient stated "he was getting stopped up."  SpO2 97% on 35% trach collar, which is being used for humidification of airway.  Stoma was cleaned, airway lavaged (per patient request) and suctioned. Scant amount of secretions obtained. Post-procedure patient wanted to wear stoma cover(he'd brought from home) and refused to wear trach collar at this time. All vital signs remained WNL during procedure and patient tolerated procedure well.

## 2012-05-15 NOTE — H&P (Signed)
NAMEMarland Kitchen  Andre Holder, Andre Holder NO.:  [id number]  MEDICAL RECORD NO.:  [id number]  LOCATION:  IC04                          FACILITY:  [facility]  PHYSICIAN:  Chalonda Schlatter G. Renard Matter, MD   DATE OF BIRTH:  October 31, 1922  DATE OF ADMISSION:  05/14/2012 DATE OF DISCHARGE:  LH                             HISTORY & PHYSICAL   This 78 year old elderly gentleman came to the emergency room with the story that he had been bitten by a copperhead on the right middle finger for approximately 1 hour prior to his being seen in the emergency department.  He had no other symptoms particularly.  The antivenom, CroFab was not given, but pharmacy or was consulted and poison control consulted, and Pharmacy is monitoring the degree of swelling in the arm to see if the medication will be necessary.  The patient does have a prior history of cancer of the larynx with previous laryngectomy.  Other significant medical problems, hypothyroidism, ASCVD, hypertension.  The patient was admitted to med/surg floor.  SOCIAL HISTORY:  The patient was a former cigarette smoker.  Does not smoke now.  Does not use alcohol.  FAMILY HISTORY:  Mother had a stroke.  Father had leukemia, but no colon cancer or significant cardiac disease.  PAST MEDICAL HISTORY:  History of hypertension, CA of the larynx treated by laryngectomy in 1988, postop irradiation, hyperlipidemia, GERD, degenerative joint disease, hypothyroidism, arteriosclerotic cardiovascular disease.  PAST SURGICAL HISTORY:  The patient had history of appendectomy in 1973 and laryngectomy in 1988 with subsequent irradiation.  A total knee arthroplasty 19 years ago, cataract extraction bilateral.  REVIEW OF SYSTEMS:  HEENT:  Negative CARDIOPULMONARY:  No cough, hemoptysis, or dyspnea.  GI:  No bowel irregularity or bleeding.  GU: No dysuria, hematuria.  ALLERGIES:  The patient has no known allergies.  HOME MEDICATIONS: 1. Amoxicillin 875/125 one b.i.d. 2.  Finasteride 5 mg daily. 3. Levothyroxine 125 mcg daily. 4. Magnesium 1 daily. 5. Multivitamin daily.  PHYSICAL EXAMINATION:  GENERAL:  Alert male. VITAL SIGNS:  Blood pressure 168/73, respirations 20, pulse 68, temperature 97.5. HEENT:  Eyes PERRLA.  TMs negative.  Oropharynx benign. NECK:  The patient does have tracheostomy. LUNGS:  Clear to P and A. HEART:  Regular rhythm.  No murmurs. ABDOMEN:  No palpable organs or masses. NEUROLOGIC:  The patient is awake and alert.  No motor or sensory disturbance. EXTREMITIES:  The patient does have evidence of some swelling of the hand and lower arm on the right.  ASSESSMENT:  The patient was admitted with a snake bite on the right hand and arm.  He does have prior history of cancer of larynx.  PLAN:  I have discussed with pharmacy the need possibly of the use of Ativan, CroFab.  He will continue to have CBC every 4 hour for 12 hours and then prothrombin times every 4 hour for 12 hours.  Pharmacy will decide along with the physician on-call the necessity of use of Ativan, CroFab.     Leila Schuff G. Renard Matter, MD     AGM/MEDQ  D:  05/14/2012  T:  05/15/2012  Job:  [id number]

## 2012-05-15 NOTE — Progress Notes (Signed)
BETH FROM [facility] CALLED TO CHECK ON PT'S STATUS. SHE STATES PT IS PROGRESSING WELL AND ACCORDING TO EXPECTED RESPONSE TO PROTOCOL CARE

## 2012-05-15 NOTE — Progress Notes (Signed)
Subjective: This is an 78 year old who was admitted yesterday with a snake bite. He did receive antivenom. He is still complaining of pain in his right hand  Objective: Vital signs in last 24 hours: Temp:  [97.5 F (36.4 C)-99.2 F (37.3 C)] 97.9 F (36.6 C) (09/07 0800) Pulse Rate:  [52-94] 52  (09/07 0700) Resp:  [11-21] 11  (09/07 0900) BP: (75-183)/(33-124) 113/54 mmHg (09/07 0900) SpO2:  [83 %-100 %] 83 % (09/07 0700) FiO2 (%):  [35 %] 35 % (09/06 1956) Weight:  [79.9 kg (176 lb 2.4 oz)-84.4 kg (186 lb 1.1 oz)] 79.9 kg (176 lb 2.4 oz) (09/07 0500) Weight change:  Last BM Date: 05/14/12  Intake/Output from previous day: 09/06 0701 - 09/07 0700 In: 630 [P.O.:240; IV Piggyback:390] Out: 875 [Urine:875]  PHYSICAL EXAM General appearance: alert, cooperative and no distress Resp: clear to auscultation bilaterally Cardio: regular rate and rhythm, S1, S2 normal, no murmur, click, rub or gallop GI: soft, non-tender; bowel sounds normal; no masses,  no organomegaly Extremities: He has swelling of the distal portion of his right hand particularly the middle finger. There is some discoloration of the tip of the finger. The swelling is better.  Lab Results:    Basic Metabolic Panel:  Basename 05/14/12 1202  NA 131*  K 4.4  CL 96  CO2 25  GLUCOSE 106*  BUN 25*  CREATININE 0.79  CALCIUM 9.7  MG --  PHOS --   Liver Function Tests:  Basename 05/14/12 1202  AST 25  ALT 18  ALKPHOS 106  BILITOT 0.3  PROT 7.0  ALBUMIN 3.8   No results found for this basename: LIPASE:2,AMYLASE:2 in the last 72 hours No results found for this basename: AMMONIA:2 in the last 72 hours CBC:  Basename 05/15/12 0036 05/14/12 1948  WBC 12.2* 14.0*  NEUTROABS 10.6* 11.9*  HGB 11.5* 12.0*  HCT 32.7* 33.4*  MCV 96.5 94.9  PLT 210 222   Cardiac Enzymes: No results found for this basename: CKTOTAL:3,CKMB:3,CKMBINDEX:3,TROPONINI:3 in the last 72 hours BNP: No results found for this  basename: PROBNP:3 in the last 72 hours D-Dimer: No results found for this basename: DDIMER:2 in the last 72 hours CBG: No results found for this basename: GLUCAP:6 in the last 72 hours Hemoglobin A1C: No results found for this basename: HGBA1C in the last 72 hours Fasting Lipid Panel: No results found for this basename: CHOL,HDL,LDLCALC,TRIG,CHOLHDL,LDLDIRECT in the last 72 hours Thyroid Function Tests: No results found for this basename: TSH,T4TOTAL,FREET4,T3FREE,THYROIDAB in the last 72 hours Anemia Panel: No results found for this basename: VITAMINB12,FOLATE,FERRITIN,TIBC,IRON,RETICCTPCT in the last 72 hours Coagulation:  Basename 05/15/12 0036 05/14/12 1948  LABPROT 14.8 15.0  INR 1.14 1.16   Urine Drug Screen: Drugs of Abuse  No results found for this basename: labopia, cocainscrnur, labbenz, amphetmu, thcu, labbarb    Alcohol Level: No results found for this basename: ETH:2 in the last 72 hours Urinalysis: No results found for this basename: COLORURINE:2,APPERANCEUR:2,LABSPEC:2,PHURINE:2,GLUCOSEU:2,HGBUR:2,BILIRUBINUR:2,KETONESUR:2,PROTEINUR:2,UROBILINOGEN:2,NITRITE:2,LEUKOCYTESUR:2 in the last 72 hours Misc. Labs:  ABGS No results found for this basename: PHART,PCO2,PO2ART,TCO2,HCO3 in the last 72 hours CULTURES Recent Results (from the past 240 hour(s))  MRSA PCR SCREENING     Status: Normal   Collection Time   05/14/12  6:54 PM      Component Value Range Status Comment   MRSA by PCR NEGATIVE  NEGATIVE Final    Studies/Results: No results found.  Medications:  Scheduled:   . crotalidae immune fab (CROFAB) infusion  4 vial Intravenous Once  .  enoxaparin  40 mg Subcutaneous Q24H  . fentaNYL  50 mcg Intravenous Once  . finasteride  5 mg Oral Daily  .  HYDROmorphone (DILAUDID) injection  1 mg Intravenous Once  .  HYDROmorphone (DILAUDID) injection  1 mg Intravenous Once  . levothyroxine  125 mcg Oral QAC breakfast  . tetanus & diphtheria toxoids (adult)  0.5 mL  Intramuscular Once  . DISCONTD: TDaP  0.5 mL Intramuscular Once   Continuous:  [id number] (DILAUDID) injection, meclizine  Assesment: He had a snake bite. He is improving. He has other problems including previous tracheostomy. He has BPH. Active Problems:  * No active hospital problems. *     Plan: No change in treatments he seems to be doing well    LOS: 1 day   Loel Betancur L 05/15/2012, 10:13 AM

## 2012-05-15 NOTE — Progress Notes (Signed)
Nutrition Brief Note  Patient identified on the Malnutrition Screening Tool (MST) report for weight loss, generating a score of 2. Talked with pt who reports hx of wt loss however has been steadily regaining to UBW over past few months.  Body mass index is 22.62 kg/(m^2). Pt meets criteria for normal range based on current BMI.   Current diet order is Dysphagia 3 with thin liquids, patient is consuming approximately 75-100% of meals at this time. Reports good appetite. Labs and medications reviewed.   No nutrition interventions warranted at this time. If nutrition issues arise, please consult RD.   Royann Shivers MS,RD,LDN Office: [phone] Pager: [phone]

## 2012-05-16 MED ORDER — DIPHENHYDRAMINE HCL 25 MG PO CAPS
25.0000 mg | ORAL_CAPSULE | Freq: Four times a day (QID) | ORAL | Status: DC | PRN
  Administered 2012-05-16: 25 mg via ORAL
  Filled 2012-05-16: qty 1

## 2012-05-16 MED ORDER — SODIUM CHLORIDE 0.9 % IJ SOLN
INTRAMUSCULAR | Status: AC
  Administered 2012-05-16: 10 mL
  Filled 2012-05-16: qty 3

## 2012-05-16 NOTE — Progress Notes (Signed)
Subjective: He says he feels better. He still has pain in his finger. He has no other new complaints.  Objective: Vital signs in last 24 hours: Temp:  [98 F (36.7 C)-98.3 F (36.8 C)] 98.3 F (36.8 C) (09/08 0000) Pulse Rate:  [42-71] 71  (09/08 0800) Resp:  [8-28] 15  (09/08 0800) BP: (108-172)/(36-157) 130/65 mmHg (09/08 0800) SpO2:  [83 %-98 %] 94 % (09/08 0800) FiO2 (%):  [35 %] 35 % (09/07 2320) Weight change:  Last BM Date: 05/14/12  Intake/Output from previous day: 09/07 0701 - 09/08 0700 In: 960 [P.O.:960] Out: 1400 [Urine:1400]  PHYSICAL EXAM General appearance: alert, cooperative and mild distress Resp: clear to auscultation bilaterally Cardio: regular rate and rhythm, S1, S2 normal, no murmur, click, rub or gallop GI: soft, non-tender; bowel sounds normal; no masses,  no organomegaly Extremities: He still has swelling in the middle finger of his right hand. He has some swelling of the entire hand. Did tip of his middle finger is dark in appearance.  Lab Results:    Basic Metabolic Panel:  Basename 05/14/12 1202  NA 131*  K 4.4  CL 96  CO2 25  GLUCOSE 106*  BUN 25*  CREATININE 0.79  CALCIUM 9.7  MG --  PHOS --   Liver Function Tests:  Basename 05/14/12 1202  AST 25  ALT 18  ALKPHOS 106  BILITOT 0.3  PROT 7.0  ALBUMIN 3.8   No results found for this basename: LIPASE:2,AMYLASE:2 in the last 72 hours No results found for this basename: AMMONIA:2 in the last 72 hours CBC:  Basename 05/15/12 0036 05/14/12 1948  WBC 12.2* 14.0*  NEUTROABS 10.6* 11.9*  HGB 11.5* 12.0*  HCT 32.7* 33.4*  MCV 96.5 94.9  PLT 210 222   Cardiac Enzymes: No results found for this basename: CKTOTAL:3,CKMB:3,CKMBINDEX:3,TROPONINI:3 in the last 72 hours BNP: No results found for this basename: PROBNP:3 in the last 72 hours D-Dimer: No results found for this basename: DDIMER:2 in the last 72 hours CBG: No results found for this basename: GLUCAP:6 in the last 72  hours Hemoglobin A1C: No results found for this basename: HGBA1C in the last 72 hours Fasting Lipid Panel: No results found for this basename: CHOL,HDL,LDLCALC,TRIG,CHOLHDL,LDLDIRECT in the last 72 hours Thyroid Function Tests: No results found for this basename: TSH,T4TOTAL,FREET4,T3FREE,THYROIDAB in the last 72 hours Anemia Panel: No results found for this basename: VITAMINB12,FOLATE,FERRITIN,TIBC,IRON,RETICCTPCT in the last 72 hours Coagulation:  Basename 05/15/12 0036 05/14/12 1948  LABPROT 14.8 15.0  INR 1.14 1.16   Urine Drug Screen: Drugs of Abuse  No results found for this basename: labopia, cocainscrnur, labbenz, amphetmu, thcu, labbarb    Alcohol Level: No results found for this basename: ETH:2 in the last 72 hours Urinalysis: No results found for this basename: COLORURINE:2,APPERANCEUR:2,LABSPEC:2,PHURINE:2,GLUCOSEU:2,HGBUR:2,BILIRUBINUR:2,KETONESUR:2,PROTEINUR:2,UROBILINOGEN:2,NITRITE:2,LEUKOCYTESUR:2 in the last 72 hours Misc. Labs:  ABGS No results found for this basename: PHART,PCO2,PO2ART,TCO2,HCO3 in the last 72 hours CULTURES Recent Results (from the past 240 hour(s))  MRSA PCR SCREENING     Status: Normal   Collection Time   05/14/12  6:54 PM      Component Value Range Status Comment   MRSA by PCR NEGATIVE  NEGATIVE Final    Studies/Results: No results found.  Medications:  Prior to Admission:  Prescriptions prior to admission  Medication Sig Dispense Refill  . finasteride (PROSCAR) 5 MG tablet Take 5 mg by mouth at bedtime.      . fish oil-omega-3 fatty acids 1000 MG capsule Take 1 g by mouth  daily.      Marland Kitchen HYDROcodone-acetaminophen (VICODIN) 5-500 MG per tablet Take 1 tablet by mouth every 8 (eight) hours as needed. Pain.      Marland Kitchen levothyroxine (SYNTHROID, LEVOTHROID) 125 MCG tablet Take 125 mcg by mouth at bedtime.       Marland Kitchen MAGNESIUM PO Take 1 tablet by mouth daily.      . meclizine (ANTIVERT) 25 MG tablet Take 25 mg by mouth 3 (three) times daily as  needed. Nausea.      . Multiple Vitamins-Minerals (MULTI COMPLETE PO) Take 1 tablet by mouth daily.       Marland Kitchen amoxicillin-clavulanate (AUGMENTIN) 875-125 MG per tablet Take 1 tablet by mouth 2 (two) times daily.       Scheduled:   . enoxaparin  40 mg Subcutaneous Q24H  . finasteride  5 mg Oral Daily  . levothyroxine  125 mcg Oral QAC breakfast  . sodium chloride      . sodium chloride       Continuous:  [id number] (DILAUDID) injection, meclizine  Assesment: He has had a snake bite. He is improving. I don't think he needs to be in the ICU still Active Problems:  * No active hospital problems. *     Plan: I will transfer him from the intensive care unit continue with his other treatments    LOS: 2 days   Lalaine Overstreet L 05/16/2012, 9:42 AM

## 2012-05-17 ENCOUNTER — Telehealth: Payer: Self-pay | Admitting: Orthopedic Surgery

## 2012-05-17 MED ORDER — HYDROMORPHONE HCL PF 1 MG/ML IJ SOLN
0.5000 mg | INTRAMUSCULAR | Status: DC | PRN
  Administered 2012-05-17 (×2): 0.5 mg via INTRAVENOUS
  Filled 2012-05-17 (×2): qty 1

## 2012-05-17 NOTE — Progress Notes (Signed)
NAMEMarland Kitchen  BIAGGIO, Andre Holder  MEDICAL RECORD NO.:  [id number]  LOCATION:  A213                          FACILITY:  [facility]  PHYSICIAN:  Kingsley Callander. Ouida Sills, MD       DATE OF BIRTH:  07/24/1923  DATE OF PROCEDURE:  05/17/2012 DATE OF DISCHARGE:                                PROGRESS NOTE   Andre Holder was admitted last Friday after he was bitten by a copperhead at his woodpile.  He has had swelling and pain in his right middle finger.  He received antivenom on Friday.  He is having a lot of pain now and continues to have some swelling and some discoloration of his skin.  PHYSICAL EXAMINATION:  VITAL SIGNS:  Temperature this morning is 98.8 with a pulse of 69, and blood pressure which is up from 130/65 to 170/68. LUNGS:  Clear. HEART:  Regular with a grade 2 systolic murmur. EXTREMITIES:  Right middle finger is red, swollen and discolored.  He has swelling of the hand and some redness in the arm as well.  IMPRESSION/PLAN: 1. Copperhead bite.  Consult General Surgery.  His white count was     12.2 on the 7th.  We will repeat his CBC and metabolic panel. 2. History of laryngeal carcinoma, status post laryngectomy. 3. History of carotid artery disease. 4. Status post pacemaker placement. 5. Hypothyroidism. 6. Benign prostatic hypertrophy.     Kingsley Callander. Ouida Sills, MD     ROF/MEDQ  D:  05/17/2012  T:  05/17/2012  Job:  [id number]

## 2012-05-17 NOTE — Progress Notes (Signed)
Patient offered a bed bath this morning but refused, wife at bedside, right hand/arm elevated with 5 pillows, right hand middle finger larger than other fingers, very tender to touch, finger discolored at tip ( bluish/red), continue with right hand/arm measurements every 4 hours, see skin assessment section, continue with pain regimen as needed, poison control personnel called for update on patient's condition will continue to follow patient.

## 2012-05-17 NOTE — Progress Notes (Signed)
Called Dr. Ouida Sills back to inform him that Dr. Ronie Spies nurse at the [facility] said that Dr. Mina Marble would not be able to go to the [facility] for a tele-evaluation.  [facility] contacted Dr. Ouida Sills while I was on the phone from him.  Dr. Ouida Sills said that he would call me back with further instructions.

## 2012-05-17 NOTE — Progress Notes (Signed)
Report called to receiving nurse at [facility] Everlene Balls, RN.

## 2012-05-17 NOTE — Consult Note (Signed)
Reason for Consult: Copper head snake bite to right middle finger Referring Physician: Dr. Carylon Holder  Andre Holder is an 78 y.o. male.  HPI: Patient is an 78 year old white male who sustained a copperhead bite to the right middle finger on 05/14/2012. He did receive antivenom. He was in the hospital over the weekend and I was consulted this morning to 2 worsening pain and swelling in the right finger. Patient has had a laryngectomy and speaks with a voice box.  Past Medical History  Diagnosis Date  . Arteriosclerotic cardiovascular disease (ASCVD)     Nonobstructive; 09/2008 50% proximal and 40% mid LAD; 25% circumflex; 30% RCA; mild global LV dysfunction with EF of 45%. No aortic stenosis.  . Mild aortic stenosis     not documented at catheterization; verified by echo in 2011  . Peripheral vascular disease     With a 70% innominate artery stenosis and nonobstructive carotid stenosis  . Hypothyroidism   . Degenerative joint disease     s/p bilateral TKR  . Mobitz (type) II atrioventricular block     With bradycardia; Medtronic pacemaker implanted in 09/2008  . Tobacco abuse, in remission     Remote  . GERD (gastroesophageal reflux disease)   . Hyperlipidemia     Lipid profile in 04/2010:115, 98, 43, 52.  . Cancer of larynx     laryngectomy in 1988; postoperative radiation therapy  . Weight loss     50 pounds between 1991 and 2011  . 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 Adrenal hyperplasia     Stable on serial imaging  . Anemia     minimal in 2011 with hemoglobin of 12.2 and high normal MCV  . Borderline hypertension     Normal CMet in 2011    Past Surgical History  Procedure Date  . Laryngectomy 1988    S/P laryngectomy and radiation therapy  . Appendectomy 1973  . Knee arthroscopy     Left  . Total knee arthroplasty     Bilateral, 19 years ago  . Cataract extraction, bilateral   . Decompression facial nerve     Right  median  . Pacemaker insertion   . Insert / replace / remove pacemaker     Family History  Problem Relation Age of Onset  . Stroke Mother   . Leukemia Father   . Colon cancer Neg Hx   . Liver disease Neg Hx   . GI problems Neg Hx   . Stroke Other   . Diabetes Other     Social History:  reports that he has quit smoking. His smoking use included Cigarettes. He does not have any smokeless tobacco history on file. He reports that he does not drink alcohol or use illicit drugs.  Allergies: No Known Allergies  Medications: I have reviewed the patient's current medications.  No results found for this or any previous visit (from the past 48 hour(s)).  No results found.  ROS: See chart Blood pressure 170/68, pulse 69, temperature 98.8 F (37.1 C), temperature source Oral, resp. rate 18, height 6\' 2"  (1.88 m), weight 79.9 kg (176 lb 2.4 oz), SpO2 94.00%. Physical Exam: Right hand extremity examination reveals a swollen right middle finger with non-tense swelling of the right hand. He is barely able to move the fingers of the right hand, but the tip of his right middle finger is insensate. Tissue gangrene is noted along the  ulnar surface of the mid right middle finger.  Assessment/Plan: Impression: Snake bite to right middle finger, worsening swelling, concern about compartmental syndrome. I discussed this with Dr. Carylon Holder. This is outside my realm of expertise and the patient needs to be referred to a hand specialist. Dr. Ouida Holder will make those arrangements.  Andre Holder A 05/17/2012, 8:56 AM

## 2012-05-17 NOTE — Progress Notes (Signed)
Dr. Ouida Sills contacted regarding pt's pain.  Pt requesting pain medication earlier than every 4 hours.  Pt c/o severe pain. Received order from Dr. Ouida Sills to change Dilaudid to Dilaudid 0.5 mg every 2 hours as needed for pain.  Orders followed.  Fara Chute, RN 05/17/2012

## 2012-05-17 NOTE — Progress Notes (Signed)
UR Chart Review Completed  

## 2012-05-17 NOTE — Discharge Summary (Signed)
NAME:  Andre Holder, [place] NO.:  [id number]  MEDICAL RECORD NO.:  [id number]  LOCATION:                                 FACILITY:  PHYSICIAN:  Kingsley Callander. Ouida Sills, MD       DATE OF BIRTH:  Apr 15, 1923  DATE OF ADMISSION:  05/14/2012 DATE OF DISCHARGE:  LH                         DISCHARGE SUMMARY-REFERRING   DISCHARGE DIAGNOSES: 1. Copperhead bite to the right middle finger. 2. Laryngeal carcinoma status post laryngectomy  in 1988. 3. Hypothyroidism. 4. Carotid artery disease. 5. Benign prostatic hypertrophy. 6. Peripheral neuropathy. 7. Status post pacemaker placement. 8. Nonobstructive coronary artery disease. 9. Mild aortic stenosis.  DISCHARGE MEDICATIONS: 1. Levothyroxine 125 mcg daily. 2. Finasteride 5 mg daily. 3. Enoxaparin 40 mg subcu q.24 hours. 4. Dilaudid 0.5 mg q.2 hours p.r.n.  HOSPITAL COURSE:  This patient is an 78 year old male who presented to the emergency room after being bitten on his right middle finger by copperhead while working in his wood pile last Friday.  He was hospitalized over the weekend and was treated with antivenom Friday night.  He has had continued pain and swelling.  Surgical consultation was obtained this morning, and recommendation was made for additional evaluation.  His case was discussed with Dr. Allayne Butcher at [facility]. Arrangements are being made for transfer to [facility] for additional evaluation and treatment.  His white count was 12.2 on the 7th with a hemoglobin of 11.5.  His INR was 1.14.  His serum sodium level was 131 on admission with a glucose of 106.  He has been stable from a cardiovascular standpoint.  He has a history of pacemaker placement, nonobstructive coronary artery disease and carotid artery disease.  ADMISSION MEDICATIONS:  Listed in the chart do not appear consistent with my medication list from the office which also included; 1. Simvastatin 40 mg daily. 2. Aspirin 81 mg daily. 3. Flomax 0.4  mg daily. 4. Gabapentin 300 mg at bedtime.  These medications can be reconciled at a future date.  The patient's condition is stable at midday on May 17, 2012.  He will have followup here after his return from [facility].     Kingsley Callander. Ouida Sills, MD     ROF/MEDQ  D:  05/17/2012  T:  05/17/2012  Job:  [id number]

## 2012-05-17 NOTE — Telephone Encounter (Signed)
Please call Larita Fife, Dr. Ronie Spies nurse SAP at [phone] about patient, Andre Holder (inpatient at [facility])

## 2012-08-25 ENCOUNTER — Emergency Department (HOSPITAL_COMMUNITY)
Admission: EM | Admit: 2012-08-25 | Discharge: 2012-08-25 | Disposition: A | Discharge status: Home / Self Care | Payer: Non-veteran care | Attending: Emergency Medicine | Admitting: Emergency Medicine

## 2012-08-25 ENCOUNTER — Encounter (HOSPITAL_COMMUNITY): Payer: Self-pay | Admitting: Emergency Medicine

## 2012-08-25 DIAGNOSIS — E039 Hypothyroidism, unspecified: Secondary | ICD-10-CM | POA: Insufficient documentation

## 2012-08-25 DIAGNOSIS — Z8719 Personal history of other diseases of the digestive system: Secondary | ICD-10-CM | POA: Insufficient documentation

## 2012-08-25 DIAGNOSIS — R51 Headache: Secondary | ICD-10-CM | POA: Insufficient documentation

## 2012-08-25 DIAGNOSIS — R509 Fever, unspecified: Secondary | ICD-10-CM | POA: Insufficient documentation

## 2012-08-25 DIAGNOSIS — Z79899 Other long term (current) drug therapy: Secondary | ICD-10-CM | POA: Insufficient documentation

## 2012-08-25 DIAGNOSIS — Z87891 Personal history of nicotine dependence: Secondary | ICD-10-CM | POA: Insufficient documentation

## 2012-08-25 DIAGNOSIS — Z9089 Acquired absence of other organs: Secondary | ICD-10-CM | POA: Insufficient documentation

## 2012-08-25 DIAGNOSIS — I2510 Atherosclerotic heart disease of native coronary artery without angina pectoris: Secondary | ICD-10-CM | POA: Insufficient documentation

## 2012-08-25 DIAGNOSIS — Z8521 Personal history of malignant neoplasm of larynx: Secondary | ICD-10-CM | POA: Insufficient documentation

## 2012-08-25 DIAGNOSIS — Z862 Personal history of diseases of the blood and blood-forming organs and certain disorders involving the immune mechanism: Secondary | ICD-10-CM | POA: Insufficient documentation

## 2012-08-25 DIAGNOSIS — Z8709 Personal history of other diseases of the respiratory system: Secondary | ICD-10-CM | POA: Insufficient documentation

## 2012-08-25 DIAGNOSIS — E785 Hyperlipidemia, unspecified: Secondary | ICD-10-CM | POA: Insufficient documentation

## 2012-08-25 DIAGNOSIS — Z7982 Long term (current) use of aspirin: Secondary | ICD-10-CM | POA: Insufficient documentation

## 2012-08-25 DIAGNOSIS — Z8669 Personal history of other diseases of the nervous system and sense organs: Secondary | ICD-10-CM | POA: Insufficient documentation

## 2012-08-25 DIAGNOSIS — Z950 Presence of cardiac pacemaker: Secondary | ICD-10-CM | POA: Insufficient documentation

## 2012-08-25 DIAGNOSIS — E278 Other specified disorders of adrenal gland: Secondary | ICD-10-CM | POA: Insufficient documentation

## 2012-08-25 DIAGNOSIS — Z8679 Personal history of other diseases of the circulatory system: Secondary | ICD-10-CM | POA: Insufficient documentation

## 2012-08-25 LAB — CBC WITH DIFFERENTIAL/PLATELET
Basophils Absolute: 0 10*3/uL (ref 0.0–0.1)
Basophils Relative: 1 % (ref 0–1)
Eosinophils Absolute: 0.1 10*3/uL (ref 0.0–0.7)
Eosinophils Relative: 2 % (ref 0–5)
Lymphocytes Relative: 11 % — ABNORMAL LOW (ref 12–46)
MCH: 33.7 pg (ref 26.0–34.0)
MCHC: 35.3 g/dL (ref 30.0–36.0)
MCV: 95.5 fL (ref 78.0–100.0)
Platelets: 204 10*3/uL (ref 150–400)
RDW: 12.9 % (ref 11.5–15.5)
WBC: 8 10*3/uL (ref 4.0–10.5)

## 2012-08-25 LAB — URINALYSIS, ROUTINE W REFLEX MICROSCOPIC
Ketones, ur: NEGATIVE mg/dL
Urobilinogen, UA: 0.2 mg/dL (ref 0.0–1.0)

## 2012-08-25 LAB — BASIC METABOLIC PANEL
Calcium: 9.2 mg/dL (ref 8.4–10.5)
GFR calc non Af Amer: 78 mL/min — ABNORMAL LOW (ref >90)
Sodium: 131 mEq/L — ABNORMAL LOW (ref 135–145)

## 2012-08-25 NOTE — ED Provider Notes (Signed)
History   This chart was scribed for Donnetta Hutching, MD by Gerlean Ren, ED Scribe. This patient was seen in room APA10/APA10 and the patient's care was started at 8:39 AM    CSN: [id number]  Arrival date & time 08/25/12  4098   First MD Initiated Contact with Patient 08/25/12 0802      Chief Complaint  Patient presents with  . Fever  . Headache     The history is provided by the patient and the spouse. No language interpreter was used.   Andre Holder is a 78 y.o. male with h/o ASCVD, PVD, and laryngeal cancer brought in by wife to the Emergency Department complaining of dizziness.  Wife states that the pt seems to be in worse health than usual beginning this morning, but only reports increased dizziness as current problem.  No chest pain. Pt is a former smoker and denies alcohol use.  PCP is Dr. Ouida Sills. ENT was Dr. Samuella Cota in [facility] for h/o laryngeal cancer Past Medical History  Diagnosis Date  . Arteriosclerotic cardiovascular disease (ASCVD)     Nonobstructive; 09/2008 50% proximal and 40% mid LAD; 25% circumflex; 30% RCA; mild global LV dysfunction with EF of 45%. No aortic stenosis.  . Mild aortic stenosis     not documented at catheterization; verified by echo in 2011  . Peripheral vascular disease     With a 70% innominate artery stenosis and nonobstructive carotid stenosis  . Hypothyroidism   . Degenerative joint disease     s/p bilateral TKR  . Mobitz (type) II atrioventricular block     With bradycardia; Medtronic pacemaker implanted in 09/2008  . Tobacco abuse, in remission     Remote  . GERD (gastroesophageal reflux disease)   . Hyperlipidemia     Lipid profile in 04/2010:115, 98, 43, 52.  . Cancer of larynx     laryngectomy in 1988; postoperative radiation therapy  . Weight loss     50 pounds between 1991 and 2011  . 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 Adrenal hyperplasia     Stable on serial imaging  . Anemia    minimal in 2011 with hemoglobin of 12.2 and high normal MCV  . Borderline hypertension     Normal CMet in 2011    Past Surgical History  Procedure Date  . Laryngectomy 1988    S/P laryngectomy and radiation therapy  . Appendectomy 1973  . Knee arthroscopy     Left  . Total knee arthroplasty     Bilateral, 19 years ago  . Cataract extraction, bilateral   . Decompression facial nerve     Right median  . Pacemaker insertion   . Insert / replace / remove pacemaker     Family History  Problem Relation Age of Onset  . Stroke Mother   . Leukemia Father   . Colon cancer Neg Hx   . Liver disease Neg Hx   . GI problems Neg Hx   . Stroke Other   . Diabetes Other     History  Substance Use Topics  . Smoking status: Former Smoker    Types: Cigarettes  . Smokeless tobacco: Not on file     Comment: Quit 30 years  . Alcohol Use: No      Review of Systems A complete 10 system review of systems was obtained and all systems are negative except as noted in the HPI and  PMH.   Allergies  Review of patient's allergies indicates no known allergies.  Home Medications   Current Outpatient Rx  Name  Route  Sig  Dispense  Refill  . FINASTERIDE 5 MG PO TABS   Oral   Take 5 mg by mouth at bedtime.         Marland Kitchen LEVOTHYROXINE SODIUM 125 MCG PO TABS   Oral   Take 125 mcg by mouth at bedtime.            BP 134/54  Pulse 67  Temp 98.2 F (36.8 C) (Oral)  Resp 20  Wt 176 lb (79.833 kg)  SpO2 93%  Physical Exam  Nursing note and vitals reviewed. Constitutional: He is oriented to person, place, and time. He appears well-developed and well-nourished.  HENT:  Head: Normocephalic and atraumatic.  Mouth/Throat: Oropharynx is clear and moist.       Dressing over permanent laryngeal stoma, not removed during exam.  Eyes: Conjunctivae normal and EOM are normal. Pupils are equal, round, and reactive to light.  Neck: Normal range of motion. Neck supple.  Cardiovascular: Normal rate,  regular rhythm and normal heart sounds.   Pulmonary/Chest: Effort normal and breath sounds normal.  Abdominal: Soft. Bowel sounds are normal.  Musculoskeletal: Normal range of motion.  Neurological: He is alert and oriented to person, place, and time.       Minimally ataxic, wife reports this is normal for him.  Skin: Skin is warm and dry.  Psychiatric: He has a normal mood and affect.    ED Course  Procedures (including critical care time) DIAGNOSTIC STUDIES: Oxygen Saturation is 93% on room air, adequate by my interpretation.    COORDINATION OF CARE: 8:45 AM- Patient informed of clinical course, understands medical decision-making process, and agrees with plan.  Ordered CBC, b-met, and urinalysis.   Results for orders placed during the hospital encounter of 08/25/12  CBC WITH DIFFERENTIAL      Component Value Range   WBC 8.0  4.0 - 10.5 K/uL   RBC 3.32 (*) 4.22 - 5.81 MIL/uL   Hemoglobin 11.2 (*) 13.0 - 17.0 g/dL   HCT 19.1 (*) 47.8 - 29.5 %   MCV 95.5  78.0 - 100.0 fL   MCH 33.7  26.0 - 34.0 pg   MCHC 35.3  30.0 - 36.0 g/dL   RDW 62.1  30.8 - 65.7 %   Platelets 204  150 - 400 K/uL   Neutrophils Relative 74  43 - 77 %   Neutro Abs 5.9  1.7 - 7.7 K/uL   Lymphocytes Relative 11 (*) 12 - 46 %   Lymphs Abs 0.9  0.7 - 4.0 K/uL   Monocytes Relative 13 (*) 3 - 12 %   Monocytes Absolute 1.0  0.1 - 1.0 K/uL   Eosinophils Relative 2  0 - 5 %   Eosinophils Absolute 0.1  0.0 - 0.7 K/uL   Basophils Relative 1  0 - 1 %   Basophils Absolute 0.0  0.0 - 0.1 K/uL  BASIC METABOLIC PANEL      Component Value Range   Sodium 131 (*) 135 - 145 mEq/L   Potassium 4.4  3.5 - 5.1 mEq/L   Chloride 95 (*) 96 - 112 mEq/L   CO2 28  19 - 32 mEq/L   Glucose, Bld 117 (*) 70 - 99 mg/dL   BUN 19  6 - 23 mg/dL   Creatinine, Ser 8.46  0.50 - 1.35 mg/dL   Calcium  9.2  8.4 - 10.5 mg/dL   GFR calc non Af Amer 78 (*) >90 mL/min   GFR calc Af Amer >90  >90 mL/min  URINALYSIS, ROUTINE W REFLEX MICROSCOPIC       Component Value Range   Color, Urine YELLOW  YELLOW   APPearance CLEAR  CLEAR   Specific Gravity, Urine 1.015  1.005 - 1.030   pH 6.0  5.0 - 8.0   Glucose, UA NEGATIVE  NEGATIVE mg/dL   Hgb urine dipstick NEGATIVE  NEGATIVE   Bilirubin Urine NEGATIVE  NEGATIVE   Ketones, ur NEGATIVE  NEGATIVE mg/dL   Protein, ur TRACE (*) NEGATIVE mg/dL   Urobilinogen, UA 0.2  0.0 - 1.0 mg/dL   Nitrite NEGATIVE  NEGATIVE   Leukocytes, UA TRACE (*) NEGATIVE  URINE MICROSCOPIC-ADD ON      Component Value Range   WBC, UA 0-2  <3 WBC/hpf   Bacteria, UA RARE  RARE       No diagnosis found.    MDM  No clinical evidence of meningitis or stroke.  Wife reports patient is at baseline. Screening labs and urinalysis showed no acute clinical problems I personally performed the services described in this documentation, which was scribed in my presence. The recorded information has been reviewed and is accurate.         Donnetta Hutching, MD 08/25/12 1052

## 2012-08-25 NOTE — ED Notes (Signed)
Pt c/o headache and fever.

## 2013-02-18 ENCOUNTER — Telehealth: Payer: Self-pay | Admitting: Internal Medicine

## 2013-02-18 NOTE — Telephone Encounter (Signed)
02-18-13 lmm @ 344pm for pt to set up past due pacer ck in [place] with dr taylor/mt

## 2013-04-07 ENCOUNTER — Encounter: Payer: Self-pay | Admitting: Internal Medicine

## 2013-04-07 ENCOUNTER — Telehealth: Payer: Self-pay | Admitting: Internal Medicine

## 2013-04-07 NOTE — Telephone Encounter (Signed)
04-07-13 sent past due certified letter/mt

## 2013-04-07 NOTE — Telephone Encounter (Signed)
[facility] by [facility]

## 2013-05-04 ENCOUNTER — Emergency Department (HOSPITAL_COMMUNITY)
Admission: EM | Admit: 2013-05-04 | Discharge: 2013-05-04 | Disposition: A | Discharge status: Home / Self Care | Payer: Non-veteran care | Attending: Emergency Medicine | Admitting: Emergency Medicine

## 2013-05-04 ENCOUNTER — Encounter (HOSPITAL_COMMUNITY): Payer: Self-pay | Admitting: Emergency Medicine

## 2013-05-04 ENCOUNTER — Emergency Department (HOSPITAL_COMMUNITY): Payer: Non-veteran care

## 2013-05-04 DIAGNOSIS — Y929 Unspecified place or not applicable: Secondary | ICD-10-CM | POA: Insufficient documentation

## 2013-05-04 DIAGNOSIS — S20212A Contusion of left front wall of thorax, initial encounter: Secondary | ICD-10-CM

## 2013-05-04 DIAGNOSIS — Z8739 Personal history of other diseases of the musculoskeletal system and connective tissue: Secondary | ICD-10-CM | POA: Insufficient documentation

## 2013-05-04 DIAGNOSIS — Z8719 Personal history of other diseases of the digestive system: Secondary | ICD-10-CM | POA: Insufficient documentation

## 2013-05-04 DIAGNOSIS — Z8679 Personal history of other diseases of the circulatory system: Secondary | ICD-10-CM | POA: Insufficient documentation

## 2013-05-04 DIAGNOSIS — Z862 Personal history of diseases of the blood and blood-forming organs and certain disorders involving the immune mechanism: Secondary | ICD-10-CM | POA: Insufficient documentation

## 2013-05-04 DIAGNOSIS — E785 Hyperlipidemia, unspecified: Secondary | ICD-10-CM | POA: Insufficient documentation

## 2013-05-04 DIAGNOSIS — Y9389 Activity, other specified: Secondary | ICD-10-CM | POA: Insufficient documentation

## 2013-05-04 DIAGNOSIS — Z79899 Other long term (current) drug therapy: Secondary | ICD-10-CM | POA: Insufficient documentation

## 2013-05-04 DIAGNOSIS — E039 Hypothyroidism, unspecified: Secondary | ICD-10-CM | POA: Insufficient documentation

## 2013-05-04 DIAGNOSIS — Z8775 Personal history of (corrected) congenital malformations of respiratory system: Secondary | ICD-10-CM | POA: Insufficient documentation

## 2013-05-04 DIAGNOSIS — W19XXXA Unspecified fall, initial encounter: Secondary | ICD-10-CM

## 2013-05-04 DIAGNOSIS — Y990 Civilian activity done for income or pay: Secondary | ICD-10-CM | POA: Insufficient documentation

## 2013-05-04 DIAGNOSIS — Z8673 Personal history of transient ischemic attack (TIA), and cerebral infarction without residual deficits: Secondary | ICD-10-CM | POA: Insufficient documentation

## 2013-05-04 DIAGNOSIS — Z87891 Personal history of nicotine dependence: Secondary | ICD-10-CM | POA: Insufficient documentation

## 2013-05-04 DIAGNOSIS — S301XXA Contusion of abdominal wall, initial encounter: Secondary | ICD-10-CM | POA: Insufficient documentation

## 2013-05-04 DIAGNOSIS — Z7982 Long term (current) use of aspirin: Secondary | ICD-10-CM | POA: Insufficient documentation

## 2013-05-04 DIAGNOSIS — W010XXA Fall on same level from slipping, tripping and stumbling without subsequent striking against object, initial encounter: Secondary | ICD-10-CM | POA: Insufficient documentation

## 2013-05-04 DIAGNOSIS — Z8639 Personal history of other endocrine, nutritional and metabolic disease: Secondary | ICD-10-CM | POA: Insufficient documentation

## 2013-05-04 DIAGNOSIS — IMO0002 Reserved for concepts with insufficient information to code with codable children: Secondary | ICD-10-CM | POA: Insufficient documentation

## 2013-05-04 LAB — CBC
HCT: 32.3 % — ABNORMAL LOW (ref 39.0–52.0)
MCHC: 35 g/dL (ref 30.0–36.0)
MCV: 94.2 fL (ref 78.0–100.0)
RDW: 12.5 % (ref 11.5–15.5)
WBC: 5.4 10*3/uL (ref 4.0–10.5)

## 2013-05-04 LAB — COMPREHENSIVE METABOLIC PANEL
Albumin: 3.6 g/dL (ref 3.5–5.2)
BUN: 14 mg/dL (ref 6–23)
Chloride: 96 mEq/L (ref 96–112)
Creatinine, Ser: 0.9 mg/dL (ref 0.50–1.35)
GFR calc Af Amer: 86 mL/min — ABNORMAL LOW (ref >90)
Glucose, Bld: 97 mg/dL (ref 70–99)
Total Bilirubin: 0.3 mg/dL (ref 0.3–1.2)

## 2013-05-04 NOTE — ED Notes (Addendum)
Pt reports falling in yard today. Pt c/o of chest pain, SOB, and weakness. Pt stated "Pacemaker felt like something came loose." Pt reports having pacemaker.

## 2013-05-04 NOTE — ED Notes (Addendum)
States that he fell an injured his left ribs this morning.  States the left upper ribs are tender to palpation.  States that the fall occurred as a result of tripping over an object.  Denies hitting his head. States that he did have central chest pain just after the fall, however absent of central chest pain at present.  States that he does feel slightly short of breath, no acute respiratory distress noted.

## 2013-05-04 NOTE — ED Provider Notes (Signed)
CSN: [id number]     Arrival date & time 05/04/13  4098 History  This chart was scribed for Andre Hutching, MD by Blanchard Kelch, ED Scribe. The patient was seen in room APA06/APA06. [name] was started at 11:16 AM.    Chief Complaint  Patient presents with  . Chest Pain  . Fall   (Consider location/radiation/quality/duration/timing/severity/associated sxs/prior Treatment)  The history is provided by the patient and the spouse. No language interpreter was used.    HPI Comments: LEGION DISCHER is a 79 y.o. male who presents to the Emergency Department complaining of a fall that occurred this morning while he was working in the yard. The patient complains of associated mild, constant, unchanged pain in left lateral inferior ribs. Patient's spouse reports congestion and mild bilateral ankle swelling. Patient has past medical history of laryngeal cancer. He has a past surgical history of laryngectomy.  Past Medical History  Diagnosis Date  . Arteriosclerotic cardiovascular disease (ASCVD)     Nonobstructive; 09/2008 50% proximal and 40% mid LAD; 25% circumflex; 30% RCA; mild global LV dysfunction with EF of 45%. No aortic stenosis.  . Mild aortic stenosis     not documented at catheterization; verified by echo in 2011  . Peripheral vascular disease     With a 70% innominate artery stenosis and nonobstructive carotid stenosis  . Hypothyroidism   . Degenerative joint disease     s/p bilateral TKR  . Mobitz (type) II atrioventricular block     With bradycardia; Medtronic pacemaker implanted in 09/2008  . Tobacco abuse, in remission     Remote  . GERD (gastroesophageal reflux disease)   . Hyperlipidemia     Lipid profile in 04/2010:115, 98, 43, 52.  . Cancer of larynx     laryngectomy in 1988; postoperative radiation therapy  . Weight loss     50 pounds between 1991 and 2011  . 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 Adrenal hyperplasia      Stable on serial imaging  . Anemia     minimal in 2011 with hemoglobin of 12.2 and high normal MCV  . Borderline hypertension     Normal CMet in 2011   Past Surgical History  Procedure Laterality Date  . Laryngectomy  1988    S/P laryngectomy and radiation therapy  . Appendectomy  1973  . Knee arthroscopy      Left  . Total knee arthroplasty      Bilateral, 19 years ago  . Cataract extraction, bilateral    . Decompression facial nerve      Right median  . Pacemaker insertion    . Insert / replace / remove pacemaker     Family History  Problem Relation Age of Onset  . Stroke Mother   . Leukemia Father   . Colon cancer Neg Hx   . Liver disease Neg Hx   . GI problems Neg Hx   . Stroke Other   . Diabetes Other    History  Substance Use Topics  . Smoking status: Former Smoker    Types: Cigarettes  . Smokeless tobacco: Not on file     Comment: Quit 30 years  . Alcohol Use: No    Review of Systems: A complete 10 system review of systems was obtained and all systems are negative except as noted in the HPI and PMH.    Allergies  Review of patient's allergies indicates no  known allergies.  Home Medications   Current Outpatient Rx  Name  Route  Sig  Dispense  Refill  . aspirin EC 81 MG tablet   Oral   Take 81 mg by mouth daily.         . cholecalciferol (VITAMIN D) 1000 UNITS tablet   Oral   Take 1,000 Units by mouth daily.         . finasteride (PROSCAR) 5 MG tablet   Oral   Take 5 mg by mouth at bedtime.         Marland Kitchen GARLIC PO   Oral   Take 1 tablet by mouth daily.         Marland Kitchen levothyroxine (SYNTHROID, LEVOTHROID) 175 MCG tablet   Oral   Take 175 mcg by mouth daily.         . Multiple Vitamin (MULTIVITAMIN WITH MINERALS) TABS   Oral   Take 1 tablet by mouth daily.         Marland Kitchen omega-3 acid ethyl esters (LOVAZA) 1 G capsule   Oral   Take 2 g by mouth daily.         . pravastatin (PRAVACHOL) 20 MG tablet   Oral   Take 10 mg by mouth at  bedtime.         . Tamsulosin HCl (FLOMAX) 0.4 MG CAPS   Oral   Take 0.4 mg by mouth at bedtime.         . vitamin C (ASCORBIC ACID) 500 MG tablet   Oral   Take 500 mg by mouth daily.          Triage Vitals: BP 133/44  Pulse 60  Temp(Src) 98.4 F (36.9 C) (Oral)  Resp 20  Ht 6\' 2"  (1.88 m)  Wt 185 lb (83.915 kg)  BMI 23.74 kg/m2  SpO2 98%  Physical Exam  Nursing note and vitals reviewed. Constitutional: He is oriented to person, place, and time. He appears well-developed and well-nourished.  HENT:  Head: Normocephalic and atraumatic.  Eyes: Conjunctivae and EOM are normal. Pupils are equal, round, and reactive to light.  Neck: Normal range of motion. Neck supple.  Cardiovascular: Normal rate, regular rhythm and normal heart sounds.   Pulmonary/Chest: Effort normal and breath sounds normal.  Abdominal: Soft. Bowel sounds are normal.  Musculoskeletal: Normal range of motion. He exhibits tenderness.  Tenderness in left lateral inferior ribs.   Neurological: He is alert and oriented to person, place, and time.  Skin: Skin is warm and dry.  Psychiatric: He has a normal mood and affect.    ED Course  Procedures (including critical care time)  DIAGNOSTIC STUDIES:  Oxygen Saturation is 98% on room air, normal by my interpretation.    COORDINATION OF CARE:  11:16 AM -Will order chest x-ray. Patient verbalizes understanding and agrees with treatment plan.    Labs Review  Results for orders placed during the hospital encounter of 05/04/13  TROPONIN I      Result Value Range   Troponin I <0.30  <0.30 ng/mL  CBC      Result Value Range   WBC 5.4  4.0 - 10.5 K/uL   RBC 3.43 (*) 4.22 - 5.81 MIL/uL   Hemoglobin 11.3 (*) 13.0 - 17.0 g/dL   HCT 95.6 (*) 21.3 - 08.6 %   MCV 94.2  78.0 - 100.0 fL   MCH 32.9  26.0 - 34.0 pg   MCHC 35.0  30.0 - 36.0 g/dL   RDW  12.5  11.5 - 15.5 %   Platelets 204  150 - 400 K/uL  COMPREHENSIVE METABOLIC PANEL      Result Value  Range   Sodium 132 (*) 135 - 145 mEq/L   Potassium 4.7  3.5 - 5.1 mEq/L   Chloride 96  96 - 112 mEq/L   CO2 28  19 - 32 mEq/L   Glucose, Bld 97  70 - 99 mg/dL   BUN 14  6 - 23 mg/dL   Creatinine, Ser 1.61  0.50 - 1.35 mg/dL   Calcium 9.4  8.4 - 09.6 mg/dL   Total Protein 7.0  6.0 - 8.3 g/dL   Albumin 3.6  3.5 - 5.2 g/dL   AST 26  0 - 37 U/L   ALT 16  0 - 53 U/L   Alkaline Phosphatase 82  39 - 117 U/L   Total Bilirubin 0.3  0.3 - 1.2 mg/dL   GFR calc non Af Amer 74 (*) >90 mL/min   GFR calc Af Amer 86 (*) >90 mL/min    Imaging Review  Dg Chest 2 View  05/04/2013   *RADIOLOGY REPORT*  Clinical Data: Left-sided chest pain after a fall.  Shortness of breath.  CHEST - 2 VIEW  Comparison: 04/13/2012.  Findings: Trachea is midline.  Heart size stable.  Left subclavian pacemaker lead tips project over the right atrium and right ventricle.  Mild bibasilar atelectasis and/or scarring.  Biapical pleural parenchymal scarring.  Left hemidiaphragm is elevated, as before.  No pleural fluid.  IMPRESSION: Probable bibasilar scarring.   Original Report Authenticated By: Leanna Battles, M.D.   Dg Ribs Unilateral Left  05/04/2013   *RADIOLOGY REPORT*  Clinical Data: Left chest pain after a fall.  Shortness of breath.  LEFT RIBS - 2 VIEW  Comparison: 04/13/2012.  Findings: Dedicated views of the left ribs show no fracture.  IMPRESSION:  1.  No evidence of acute rib fracture. 2.  Chest radiograph is dictated separately.   Original Report Authenticated By: Leanna Battles, M.D.    MDM  No diagnosis found. No respiratory distress noted. X-rays of left ribs show no fracture. Patient is hemodynamically stable. Pulse ox 98%  I personally performed the services described in this documentation, which was scribed in my presence. The recorded information has been reviewed and is accurate.    Andre Hutching, MD 05/04/13 1324

## 2013-08-25 ENCOUNTER — Inpatient Hospital Stay (HOSPITAL_COMMUNITY)
Admission: EM | Admit: 2013-08-25 | Discharge: 2013-08-27 | DRG: 292 | Disposition: A | Discharge status: Home / Self Care | Payer: Non-veteran care | Attending: Internal Medicine | Admitting: Internal Medicine

## 2013-08-25 ENCOUNTER — Emergency Department (HOSPITAL_COMMUNITY): Payer: Non-veteran care

## 2013-08-25 ENCOUNTER — Encounter (HOSPITAL_COMMUNITY): Payer: Self-pay | Admitting: Emergency Medicine

## 2013-08-25 DIAGNOSIS — I2510 Atherosclerotic heart disease of native coronary artery without angina pectoris: Secondary | ICD-10-CM | POA: Diagnosis present

## 2013-08-25 DIAGNOSIS — Z9089 Acquired absence of other organs: Secondary | ICD-10-CM

## 2013-08-25 DIAGNOSIS — Z7982 Long term (current) use of aspirin: Secondary | ICD-10-CM

## 2013-08-25 DIAGNOSIS — E039 Hypothyroidism, unspecified: Secondary | ICD-10-CM | POA: Diagnosis present

## 2013-08-25 DIAGNOSIS — M1710 Unilateral primary osteoarthritis, unspecified knee: Secondary | ICD-10-CM | POA: Diagnosis present

## 2013-08-25 DIAGNOSIS — Z96659 Presence of unspecified artificial knee joint: Secondary | ICD-10-CM

## 2013-08-25 DIAGNOSIS — K219 Gastro-esophageal reflux disease without esophagitis: Secondary | ICD-10-CM | POA: Diagnosis present

## 2013-08-25 DIAGNOSIS — Z823 Family history of stroke: Secondary | ICD-10-CM

## 2013-08-25 DIAGNOSIS — D649 Anemia, unspecified: Secondary | ICD-10-CM | POA: Diagnosis present

## 2013-08-25 DIAGNOSIS — Z950 Presence of cardiac pacemaker: Secondary | ICD-10-CM

## 2013-08-25 DIAGNOSIS — Z806 Family history of leukemia: Secondary | ICD-10-CM

## 2013-08-25 DIAGNOSIS — I359 Nonrheumatic aortic valve disorder, unspecified: Secondary | ICD-10-CM

## 2013-08-25 DIAGNOSIS — R079 Chest pain, unspecified: Secondary | ICD-10-CM

## 2013-08-25 DIAGNOSIS — E871 Hypo-osmolality and hyponatremia: Secondary | ICD-10-CM

## 2013-08-25 DIAGNOSIS — I5023 Acute on chronic systolic (congestive) heart failure: Principal | ICD-10-CM | POA: Diagnosis present

## 2013-08-25 DIAGNOSIS — I350 Nonrheumatic aortic (valve) stenosis: Secondary | ICD-10-CM

## 2013-08-25 DIAGNOSIS — Z923 Personal history of irradiation: Secondary | ICD-10-CM

## 2013-08-25 DIAGNOSIS — I509 Heart failure, unspecified: Secondary | ICD-10-CM

## 2013-08-25 DIAGNOSIS — Z8521 Personal history of malignant neoplasm of larynx: Secondary | ICD-10-CM

## 2013-08-25 DIAGNOSIS — I429 Cardiomyopathy, unspecified: Secondary | ICD-10-CM

## 2013-08-25 DIAGNOSIS — I10 Essential (primary) hypertension: Secondary | ICD-10-CM | POA: Diagnosis present

## 2013-08-25 DIAGNOSIS — Z85828 Personal history of other malignant neoplasm of skin: Secondary | ICD-10-CM

## 2013-08-25 DIAGNOSIS — N400 Enlarged prostate without lower urinary tract symptoms: Secondary | ICD-10-CM | POA: Diagnosis present

## 2013-08-25 DIAGNOSIS — I441 Atrioventricular block, second degree: Secondary | ICD-10-CM

## 2013-08-25 DIAGNOSIS — Z930 Tracheostomy status: Secondary | ICD-10-CM

## 2013-08-25 DIAGNOSIS — E875 Hyperkalemia: Secondary | ICD-10-CM | POA: Diagnosis present

## 2013-08-25 DIAGNOSIS — I5021 Acute systolic (congestive) heart failure: Secondary | ICD-10-CM

## 2013-08-25 DIAGNOSIS — Z833 Family history of diabetes mellitus: Secondary | ICD-10-CM

## 2013-08-25 DIAGNOSIS — E785 Hyperlipidemia, unspecified: Secondary | ICD-10-CM

## 2013-08-25 DIAGNOSIS — I739 Peripheral vascular disease, unspecified: Secondary | ICD-10-CM | POA: Diagnosis present

## 2013-08-25 DIAGNOSIS — Z87891 Personal history of nicotine dependence: Secondary | ICD-10-CM

## 2013-08-25 DIAGNOSIS — Z79899 Other long term (current) drug therapy: Secondary | ICD-10-CM

## 2013-08-25 HISTORY — DX: Unspecified malignant neoplasm of skin, unspecified: C44.90

## 2013-08-25 LAB — CBC WITH DIFFERENTIAL/PLATELET
Basophils Absolute: 0 10*3/uL (ref 0.0–0.1)
Basophils Relative: 0 % (ref 0–1)
Eosinophils Relative: 4 % (ref 0–5)
HCT: 30.9 % — ABNORMAL LOW (ref 39.0–52.0)
Hemoglobin: 10.6 g/dL — ABNORMAL LOW (ref 13.0–17.0)
MCH: 32.7 pg (ref 26.0–34.0)
MCHC: 34.3 g/dL (ref 30.0–36.0)
MCV: 95.4 fL (ref 78.0–100.0)
Monocytes Absolute: 0.6 10*3/uL (ref 0.1–1.0)
Monocytes Relative: 7 % (ref 3–12)
RDW: 12.6 % (ref 11.5–15.5)

## 2013-08-25 LAB — PRO B NATRIURETIC PEPTIDE: Pro B Natriuretic peptide (BNP): 1186 pg/mL — ABNORMAL HIGH (ref 0–450)

## 2013-08-25 LAB — TROPONIN I: Troponin I: <0.3 ng/mL (ref <0.30)

## 2013-08-25 LAB — COMPREHENSIVE METABOLIC PANEL
ALT: 18 U/L (ref 0–53)
AST: 28 U/L (ref 0–37)
Albumin: 3.6 g/dL (ref 3.5–5.2)
Alkaline Phosphatase: 86 U/L (ref 39–117)
GFR calc Af Amer: 88 mL/min — ABNORMAL LOW (ref >90)
Glucose, Bld: 104 mg/dL — ABNORMAL HIGH (ref 70–99)
Potassium: 5.3 mEq/L — ABNORMAL HIGH (ref 3.5–5.1)
Sodium: 129 mEq/L — ABNORMAL LOW (ref 135–145)
Total Protein: 6.9 g/dL (ref 6.0–8.3)

## 2013-08-25 MED ORDER — FUROSEMIDE 10 MG/ML IJ SOLN
40.0000 mg | Freq: Three times a day (TID) | INTRAMUSCULAR | Status: DC
  Administered 2013-08-25 – 2013-08-26 (×4): 40 mg via INTRAVENOUS
  Filled 2013-08-25 (×5): qty 4

## 2013-08-25 MED ORDER — VITAMIN D 1000 UNITS PO TABS
1000.0000 [IU] | ORAL_TABLET | Freq: Every day | ORAL | Status: DC
  Administered 2013-08-25 – 2013-08-27 (×3): 1000 [IU] via ORAL
  Filled 2013-08-25 (×3): qty 1

## 2013-08-25 MED ORDER — ACETAMINOPHEN 325 MG PO TABS: 650.0000 mg | ORAL_TABLET | Freq: Four times a day (QID) | ORAL | Status: DC | PRN

## 2013-08-25 MED ORDER — ASPIRIN EC 81 MG PO TBEC
81.0000 mg | DELAYED_RELEASE_TABLET | Freq: Every day | ORAL | Status: DC
  Administered 2013-08-25 – 2013-08-27 (×3): 81 mg via ORAL
  Filled 2013-08-25 (×3): qty 1

## 2013-08-25 MED ORDER — LEVOTHYROXINE SODIUM 175 MCG PO TABS
175.0000 ug | ORAL_TABLET | Freq: Every day | ORAL | Status: DC
  Administered 2013-08-25 – 2013-08-27 (×3): 175 ug via ORAL
  Filled 2013-08-25 (×5): qty 1

## 2013-08-25 MED ORDER — SODIUM CHLORIDE 0.9 % IJ SOLN
3.0000 mL | Freq: Two times a day (BID) | INTRAMUSCULAR | Status: DC
  Administered 2013-08-26 – 2013-08-27 (×3): 3 mL via INTRAVENOUS

## 2013-08-25 MED ORDER — FINASTERIDE 5 MG PO TABS
5.0000 mg | ORAL_TABLET | Freq: Every day | ORAL | Status: DC
  Administered 2013-08-25 – 2013-08-26 (×2): 5 mg via ORAL
  Filled 2013-08-25 (×4): qty 1

## 2013-08-25 MED ORDER — TAMSULOSIN HCL 0.4 MG PO CAPS
0.4000 mg | ORAL_CAPSULE | Freq: Every day | ORAL | Status: DC
  Administered 2013-08-25 – 2013-08-26 (×2): 0.4 mg via ORAL
  Filled 2013-08-25 (×2): qty 1

## 2013-08-25 MED ORDER — SODIUM CHLORIDE 0.9 % IJ SOLN: 3.0000 mL | INTRAMUSCULAR | Status: DC | PRN

## 2013-08-25 MED ORDER — ONDANSETRON HCL 4 MG/2ML IJ SOLN: 4.0000 mg | Freq: Four times a day (QID) | INTRAMUSCULAR | Status: DC | PRN

## 2013-08-25 MED ORDER — ADULT MULTIVITAMIN W/MINERALS CH
1.0000 | ORAL_TABLET | Freq: Every day | ORAL | Status: DC
  Administered 2013-08-25 – 2013-08-27 (×3): 1 via ORAL
  Filled 2013-08-25 (×3): qty 1

## 2013-08-25 MED ORDER — ACETAMINOPHEN 650 MG RE SUPP: 650.0000 mg | Freq: Four times a day (QID) | RECTAL | Status: DC | PRN

## 2013-08-25 MED ORDER — SODIUM CHLORIDE 0.9 % IV SOLN: 250.0000 mL | INTRAVENOUS | Status: DC | PRN

## 2013-08-25 MED ORDER — PANTOPRAZOLE SODIUM 40 MG PO TBEC
40.0000 mg | DELAYED_RELEASE_TABLET | Freq: Every day | ORAL | Status: DC
  Administered 2013-08-25 – 2013-08-27 (×3): 40 mg via ORAL
  Filled 2013-08-25 (×3): qty 1

## 2013-08-25 MED ORDER — ONDANSETRON HCL 4 MG PO TABS: 4.0000 mg | ORAL_TABLET | Freq: Four times a day (QID) | ORAL | Status: DC | PRN

## 2013-08-25 MED ORDER — HYDROCODONE-ACETAMINOPHEN 5-325 MG PO TABS
1.0000 | ORAL_TABLET | ORAL | Status: DC | PRN
  Administered 2013-08-25: 1 via ORAL
  Filled 2013-08-25: qty 1

## 2013-08-25 MED ORDER — FUROSEMIDE 10 MG/ML IJ SOLN
40.0000 mg | Freq: Once | INTRAMUSCULAR | Status: AC
  Administered 2013-08-25: 40 mg via INTRAVENOUS
  Filled 2013-08-25: qty 4

## 2013-08-25 MED ORDER — ENOXAPARIN SODIUM 40 MG/0.4ML ~~LOC~~ SOLN
40.0000 mg | SUBCUTANEOUS | Status: DC
  Administered 2013-08-25 – 2013-08-26 (×2): 40 mg via SUBCUTANEOUS
  Filled 2013-08-25 (×2): qty 0.4

## 2013-08-25 MED ORDER — ALUM & MAG HYDROXIDE-SIMETH 200-200-20 MG/5ML PO SUSP
30.0000 mL | Freq: Four times a day (QID) | ORAL | Status: DC | PRN
  Administered 2013-08-26: 30 mL via ORAL
  Filled 2013-08-25 (×2): qty 30

## 2013-08-25 NOTE — ED Notes (Signed)
Pt alert, resting in bed, voices no complaints. Wife at bedside.

## 2013-08-25 NOTE — ED Provider Notes (Signed)
CSN: [id number]     Arrival date & time 08/25/13  4098 History  This chart was scribed for Charles B. Bernette Mayers, MD by Quintella Reichert, ED scribe.  This patient was seen in room APA18/APA18 and the patient's care was started at 9:40 AM.   Chief Complaint  Patient presents with  . Chest Pain  . Shortness of Breath    The history is provided by the patient. No language interpreter was used.    HPI Comments: Andre Holder is a 79 y.o. male who presents to the Emergency Department complaining of several days of persistent worsening SOB with associated central CP worsened by deep breathing.  Pt states he has feeling SOB for several day but this morning his SOB worsened and he also developed chest pain.  He localizes pain to the midsternal area.  He states pain is present constantly but is worsened by deep breathing.  He denies cough or fever.  He does admit to bilateral lower leg swelling.  He states he has had some emesis but he attributes this to his GERD.  Pt has not received a cardiac workup recently.   Past Medical History  Diagnosis Date  . Arteriosclerotic cardiovascular disease (ASCVD)     Nonobstructive; 09/2008 50% proximal and 40% mid LAD; 25% circumflex; 30% RCA; mild global LV dysfunction with EF of 45%. No aortic stenosis.  . Mild aortic stenosis     not documented at catheterization; verified by echo in 2011  . Peripheral vascular disease     With a 70% innominate artery stenosis and nonobstructive carotid stenosis  . Hypothyroidism   . Degenerative joint disease     s/p bilateral TKR  . Mobitz (type) II atrioventricular block     With bradycardia; Medtronic pacemaker implanted in 09/2008  . Tobacco abuse, in remission     Remote  . GERD (gastroesophageal reflux disease)   . Hyperlipidemia     Lipid profile in 04/2010:115, 98, 43, 52.  . Cancer of larynx     laryngectomy in 1988; postoperative radiation therapy  . Weight loss     50 pounds between 1991 and 2011  .  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 Adrenal hyperplasia     Stable on serial imaging  . Anemia     minimal in 2011 with hemoglobin of 12.2 and high normal MCV  . Borderline hypertension     Normal CMet in 2011  . Skin cancer     Past Surgical History  Procedure Laterality Date  . Laryngectomy  1988    S/P laryngectomy and radiation therapy  . Appendectomy  1973  . Knee arthroscopy      Left  . Total knee arthroplasty      Bilateral, 19 years ago  . Cataract extraction, bilateral    . Decompression facial nerve      Right median  . Pacemaker insertion    . Insert / replace / remove pacemaker      Family History  Problem Relation Age of Onset  . Stroke Mother   . Leukemia Father   . Colon cancer Neg Hx   . Liver disease Neg Hx   . GI problems Neg Hx   . Stroke Other   . Diabetes Other     History  Substance Use Topics  . Smoking status: Former Smoker    Types: Cigarettes  . Smokeless tobacco: Former Neurosurgeon  Quit date: 09/08/1982     Comment: Quit 30 years  . Alcohol Use: No     Review of Systems A complete 10 system review of systems was obtained and all systems are negative except as noted in the HPI and PMH.    Allergies  Review of patient's allergies indicates no known allergies.  Home Medications   Current Outpatient Rx  Name  Route  Sig  Dispense  Refill  . amoxicillin-clavulanate (AUGMENTIN) 875-125 MG per tablet   Oral   Take 1 tablet by mouth 2 (two) times daily.         Marland Kitchen aspirin EC 81 MG tablet   Oral   Take 81 mg by mouth daily.         . cholecalciferol (VITAMIN D) 1000 UNITS tablet   Oral   Take 1,000 Units by mouth daily.         . finasteride (PROSCAR) 5 MG tablet   Oral   Take 5 mg by mouth at bedtime.         Marland Kitchen GARLIC PO   Oral   Take 1 tablet by mouth daily.         Marland Kitchen levothyroxine (SYNTHROID, LEVOTHROID) 175 MCG tablet   Oral   Take 175 mcg by mouth daily.          . Multiple Vitamin (MULTIVITAMIN WITH MINERALS) TABS   Oral   Take 1 tablet by mouth daily.         Marland Kitchen omega-3 acid ethyl esters (LOVAZA) 1 G capsule   Oral   Take 2 g by mouth daily.         . pravastatin (PRAVACHOL) 20 MG tablet   Oral   Take 10 mg by mouth at bedtime.         . Tamsulosin HCl (FLOMAX) 0.4 MG CAPS   Oral   Take 0.4 mg by mouth at bedtime.         . vitamin C (ASCORBIC ACID) 500 MG tablet   Oral   Take 500 mg by mouth daily.          BP 101/75  Pulse 67  Temp(Src) 98.2 F (36.8 C) (Oral)  Resp 19  Ht 6\' 2"  (1.88 m)  Wt 185 lb (83.915 kg)  BMI 23.74 kg/m2  SpO2 98%  Physical Exam  Nursing note and vitals reviewed. Constitutional: He is oriented to person, place, and time. He appears well-developed and well-nourished.  HENT:  Head: Normocephalic and atraumatic.  Eyes: EOM are normal. Pupils are equal, round, and reactive to light.  Neck: Normal range of motion. Neck supple.  Cardiovascular: Normal rate and intact distal pulses.   Murmur heard. Pulmonary/Chest: Effort normal and breath sounds normal.  Abdominal: Bowel sounds are normal. He exhibits no distension. There is no tenderness.  Musculoskeletal: Normal range of motion. He exhibits edema. He exhibits no tenderness.  2+ edema to lower extremities bilaterally  Neurological: He is alert and oriented to person, place, and time. He has normal strength. No cranial nerve deficit or sensory deficit.  Skin: Skin is warm and dry. No rash noted.  Psychiatric: He has a normal mood and affect.    ED Course  Procedures (including critical care time)  DIAGNOSTIC STUDIES: Oxygen Saturation is 98% on room air, normal by my interpretation.    COORDINATION OF CARE: 9:46 AM-Discussed treatment plan which includes cardiac workup with pt at bedside and pt agreed to plan.    Labs Review  Labs Reviewed  COMPREHENSIVE METABOLIC PANEL - Abnormal; Notable for the following:    Sodium 129 (*)     Potassium 5.3 (*)    Chloride 92 (*)    Glucose, Bld 104 (*)    GFR calc non Af Amer 76 (*)    GFR calc Af Amer 88 (*)    All other components within normal limits  CBC WITH DIFFERENTIAL - Abnormal; Notable for the following:    RBC 3.24 (*)    Hemoglobin 10.6 (*)    HCT 30.9 (*)    Neutrophils Relative % 78 (*)    Lymphocytes Relative 11 (*)    All other components within normal limits  PRO B NATRIURETIC PEPTIDE - Abnormal; Notable for the following:    Pro B Natriuretic peptide (BNP) 1186.0 (*)    All other components within normal limits  TROPONIN I    Imaging Review Dg Chest 2 View  08/25/2013   CLINICAL DATA:  Dyspnea and chest pain and peripheral edema history of cardiac dysrhythmia, aortic stenosis, and pacemaker placement.  EXAM: CHEST  2 VIEW  COMPARISON:  Chest x-ray dated May 04, 2013.  FINDINGS: The left hemidiaphragm is elevated but this is stable. There is basilar atelectasis on the left. Both lungs exhibit mildly increased interstitial markings which is not a new finding. The cardiopericardial silhouette is and is enlarged. The central pulmonary vascularity is prominent. The permanent pacemaker appears in appropriate position. There is no significant pleural effusion.  IMPRESSION: The findings are consistent with congestive heart failure with mild pulmonary interstitial and early alveolar edema. One cannot exclude superimposed pneumonia in the appropriate clinical setting.   Electronically Signed   By: David  [place]   On: 08/25/2013 11:29    EKG Interpretation    Date/Time:  Thursday August 25 2013 09:27:20 EST Ventricular Rate:  67 PR Interval:    QRS Duration: 210 QT Interval:  490 QTC Calculation: 517 R Axis:   -12 Text Interpretation:  Ventricular-paced rhythm Abnormal ECG When compared with ECG of 04-May-2013 10:12, Electronic ventricular pacemaker has replaced Electronic atrial pacemaker Confirmed by SHELDON  MD, CHARLES ([id number]) on 08/25/2013 9:39:10  AM            MDM   1. CHF (congestive heart failure)   2. Chest pain     Labs and imaging reviewed, concerning for CHF, no known history of same. Has had non-obstructive CAD and aortic stenosis in the past. Will discussed with PCP for admission. Lasix given IV.    I personally performed the services described in this documentation, which was scribed in my presence. The recorded information has been reviewed and is accurate.      Charles B. Bernette Mayers, MD 08/25/13 1158

## 2013-08-25 NOTE — ED Notes (Signed)
Patient c/o midsternal chest pain, non-radiating, that started this morning. Per wife patient took baby aspirin this morning. Patient states "I can't get my breath."

## 2013-08-25 NOTE — Progress Notes (Signed)
Received report on new pt from ED. Pt requested to stay in ED until he finishes his meal tray. Will get room ready for pt.

## 2013-08-25 NOTE — ED Notes (Signed)
Awaiting meal trays for the pt and wife, report called to the floor, will let the pt eat then transport to the floor.

## 2013-08-26 DIAGNOSIS — I5021 Acute systolic (congestive) heart failure: Secondary | ICD-10-CM

## 2013-08-26 DIAGNOSIS — I441 Atrioventricular block, second degree: Secondary | ICD-10-CM

## 2013-08-26 DIAGNOSIS — I428 Other cardiomyopathies: Secondary | ICD-10-CM

## 2013-08-26 DIAGNOSIS — I359 Nonrheumatic aortic valve disorder, unspecified: Secondary | ICD-10-CM

## 2013-08-26 DIAGNOSIS — E785 Hyperlipidemia, unspecified: Secondary | ICD-10-CM

## 2013-08-26 DIAGNOSIS — Z950 Presence of cardiac pacemaker: Secondary | ICD-10-CM

## 2013-08-26 DIAGNOSIS — E871 Hypo-osmolality and hyponatremia: Secondary | ICD-10-CM

## 2013-08-26 DIAGNOSIS — R079 Chest pain, unspecified: Secondary | ICD-10-CM

## 2013-08-26 DIAGNOSIS — I059 Rheumatic mitral valve disease, unspecified: Secondary | ICD-10-CM

## 2013-08-26 LAB — BASIC METABOLIC PANEL
CO2: 29 mEq/L (ref 19–32)
Chloride: 92 mEq/L — ABNORMAL LOW (ref 96–112)
GFR calc non Af Amer: 73 mL/min — ABNORMAL LOW (ref >90)
Glucose, Bld: 103 mg/dL — ABNORMAL HIGH (ref 70–99)
Potassium: 3.7 mEq/L (ref 3.5–5.1)
Sodium: 129 mEq/L — ABNORMAL LOW (ref 135–145)

## 2013-08-26 MED ORDER — MAGNESIUM HYDROXIDE NICU ORAL SYRINGE 400 MG/5 ML
30.0000 mL | Freq: Every day | ORAL | Status: DC | PRN
  Filled 2013-08-26: qty 30

## 2013-08-26 MED ORDER — MAGNESIUM CITRATE PO SOLN
1.0000 | Freq: Once | ORAL | Status: AC
  Administered 2013-08-26: 1 via ORAL
  Filled 2013-08-26: qty 296

## 2013-08-26 MED ORDER — POTASSIUM CHLORIDE CRYS ER 20 MEQ PO TBCR
20.0000 meq | EXTENDED_RELEASE_TABLET | Freq: Two times a day (BID) | ORAL | Status: DC
  Administered 2013-08-26 – 2013-08-27 (×3): 20 meq via ORAL
  Filled 2013-08-26 (×3): qty 1

## 2013-08-26 NOTE — Progress Notes (Signed)
UR completed.  Patient changed to inpatient r/t requiring scheduled IV lasix 

## 2013-08-26 NOTE — Consult Note (Signed)
CARDIOLOGY CONSULT NOTE   Patient ID: Andre Holder MRN: [id number] DOB/AGE: 1923-03-12 79 y.o.  Admit Date: 08/25/2013 Referring Physician: Carylon Perches MD Primary Physician: Andre Perches, MD Consulting Cardiologist: Andre Holder Cardiologist Andre Bunting MD Reason for Consultation: Chest Pain/CHF    Clinical Summary Andre Holder is a 79 y.o.male admitted with worsening shortness of breath and chest pressure. He has had a follow up with Dr. Ladona Holder in Feb of this year and has been seen in the past by Dr. Dietrich Holder in 2012, but medical needs are provided by Andre Holder.He has not seen general cardiology in years. He is s/p laryngectomy with radiation therapy in 1988 and now has trach and communicates with voice amplifier.    States he has been having symptoms for over a week, with trouble taking deep breaths, some swelling in his lower extremities. He admits to eating salty foods to include ham, sausage etc.  He also clears his trach and mouth with salt water and baking soda on a daily basis.     Pro-BNP on 1186, with CXR consistent with CHF and mild pulmonary interstitial edema and early alveolar edema. However, could not exclude pneumonia. He was treated with lasix. Cardiac markers are negative X 3. Evidence of mild anemia.    Other history includes AoV disease, most recent echo 2013 described as mild stenosis, PAD, hypertension, hyperlipidemia, hypothyroid, GERD.    No Known Allergies  Medications Scheduled Medications: . aspirin EC  81 mg Oral Daily  . cholecalciferol  1,000 Units Oral Daily  . enoxaparin (LOVENOX) injection  40 mg Subcutaneous Q24H  . finasteride  5 mg Oral QHS  . furosemide  40 mg Intravenous Q8H  . levothyroxine  175 mcg Oral QAC breakfast  . multivitamin with minerals  1 tablet Oral Daily  . pantoprazole  40 mg Oral Daily  . potassium chloride  20 mEq Oral BID  . sodium chloride  3 mL Intravenous Q12H  . tamsulosin  0.4 mg Oral QHS       PRN  Medications: sodium chloride, acetaminophen, acetaminophen, alum & mag hydroxide-simeth, HYDROcodone-acetaminophen, ondansetron (ZOFRAN) IV, ondansetron, sodium chloride   Past Medical History  Diagnosis Date  . Arteriosclerotic cardiovascular disease (ASCVD)     Nonobstructive; 09/2008 50% proximal and 40% mid LAD; 25% circumflex; 30% RCA; mild global LV dysfunction with EF of 45%. No aortic stenosis.  . Mild aortic stenosis     not documented at catheterization; verified by echo in 2011  . Peripheral vascular disease     With a 70% innominate artery stenosis and nonobstructive carotid stenosis  . Hypothyroidism   . Degenerative joint disease     s/p bilateral TKR  . Mobitz (type) II atrioventricular block     With bradycardia; Medtronic pacemaker implanted in 09/2008  . Tobacco abuse, in remission     Remote  . GERD (gastroesophageal reflux disease)   . Hyperlipidemia     Lipid profile in 04/2010:115, 98, 43, 52.  . Cancer of larynx     laryngectomy in 1988; postoperative radiation therapy  . Weight loss     50 pounds between 1991 and 2011  . 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 Adrenal hyperplasia     Stable on serial imaging  . Anemia     minimal in 2011 with hemoglobin of 12.2 and high normal MCV  . Borderline hypertension     Normal CMet in 2011  .  Skin cancer     Past Surgical History  Procedure Laterality Date  . Laryngectomy  1988    S/P laryngectomy and radiation therapy  . Appendectomy  1973  . Knee arthroscopy      Left  . Total knee arthroplasty      Bilateral, 19 years ago  . Cataract extraction, bilateral    . Decompression facial nerve      Right median  . Pacemaker insertion    . Insert / replace / remove pacemaker      Family History  Problem Relation Age of Onset  . Stroke Mother   . Leukemia Father   . Colon cancer Neg Hx   . Liver disease Neg Hx   . GI problems Neg Hx   . Stroke Other   .  Diabetes Other     Social History Andre Holder reports that he has quit smoking. His smoking use included Cigarettes. He smoked 0.00 packs per day. He quit smokeless tobacco use about 30 years ago. Andre Holder reports that he does not drink alcohol.  Review of Systems Otherwise reviewed and negative except as outlined.  Physical Examination Blood pressure 95/44, pulse 64, temperature 97.9 F (36.6 C), temperature source Oral, resp. rate 17, height 6\' 2"  (1.88 m), weight 182 lb 12.2 oz (82.9 kg), SpO2 94.00%.  Intake/Output Summary (Last 24 hours) at 08/26/13 1031 Last data filed at 08/26/13 1001  Gross per 24 hour  Intake    240 ml  Output   1000 ml  Net   -760 ml    Telemetry: Paced  HEENT: Conjunctiva and lids normal, oropharynx clear with moist mucosa. Neck: Supple, no elevated JVP or carotid bruits, no thyromegaly. Lungs: Clear to auscultation, nonlabored breathing at rest. Cardiac: Regular rate and rhythm, no S3 or significant systolic murmur, no pericardial rub. Abdomen: Soft, nontender, no hepatomegaly, bowel sounds present, no guarding or rebound. Extremities: No pitting edema, distal pulses 2+. Skin: Warm and dry. Musculoskeletal: No kyphosis. Neuropsychiatric: Alert and oriented x3, affect grossly appropriate.  Prior Cardiac Testing/Procedures 1. Cardiac cath 2001 CONCLUSIONS: Nonobstructive coronary artery disease with good left  ventricular function.  2. Dual chamber PPM 09/2008 Medtronic Adapta L model ADDR1 (serial [id number]) dual-  chamber pacemaker.  3. Echocardiogram 10/09/2011 Left ventricle: The cavity size was normal. There was mild concentric hypertrophy. Systolic function was moderately reduced. The estimated ejection fraction was 35%. Moderate diffuse hypokinesis. Severe septal hypokinesis with abnormal motion at least partially related to RV paced rhythm. Cannot exclude apical thrombus, but likely represents ectopic chordae, a normal variant. -  Ventricular septum: Dyssynergic pattern of myocardial contraction. - Aortic valve: Cusp separation was moderately reduced. There was very mild stenosis. Very mild regurgitation. - Mitral valve: Calcified annulus. Mildly thickened leaflets . Mild regurgitation.  4. Angiogram of innominate artery OPERATIVE FINDINGS:  1. Innominate artery stenosis approximately 70%.  2. Retrograde filling of right vertebral artery.  3. Large left vertebral artery with antegrade flow.  4. Minimal carotid stenosis bilaterally.  Lab Results  Basic Metabolic Panel:  Recent Labs Lab 08/25/13 0948 08/26/13 0633  NA 129* 129*  K 5.3* 3.7  CL 92* 92*  CO2 28 29  GLUCOSE 104* 103*  BUN 16 18  CREATININE 0.84 0.93  CALCIUM 9.1 8.2*    Liver Function Tests:  Recent Labs Lab 08/25/13 0948  AST 28  ALT 18  ALKPHOS 86  BILITOT 0.5  PROT 6.9  ALBUMIN 3.6    CBC:  Recent Labs  Lab 08/25/13 0948  WBC 8.1  NEUTROABS 6.3  HGB 10.6*  HCT 30.9*  MCV 95.4  PLT 214    Cardiac Enzymes:  Recent Labs Lab 08/25/13 0948  TROPONINI <0.30    Radiology: Dg Chest 2 View  08/25/2013   CLINICAL DATA:  Dyspnea and chest pain and peripheral edema history of cardiac dysrhythmia, aortic stenosis, and pacemaker placement.  EXAM: CHEST  2 VIEW  COMPARISON:  Chest x-ray dated May 04, 2013.  FINDINGS: The left hemidiaphragm is elevated but this is stable. There is basilar atelectasis on the left. Both lungs exhibit mildly increased interstitial markings which is not a new finding. The cardiopericardial silhouette is and is enlarged. The central pulmonary vascularity is prominent. The permanent pacemaker appears in appropriate position. There is no significant pleural effusion.  IMPRESSION: The findings are consistent with congestive heart failure with mild pulmonary interstitial and early alveolar edema. One cannot exclude superimposed pneumonia in the appropriate clinical setting.   Electronically Signed    By: David  [place]   On: 08/25/2013 11:29     ECG: Ventricular Pacing   Impression and Recommendations 1. Systolic CHF: He is breathing and feeling better with IV diureses, with continued crackles in the bases, but no wheezes. Wt loss of 3 lbs.  Repeat echo for re-evaluation of LV and AoV with significant murmur. Continue I do not see that he is on an ACE or BB at this time. Creatinine 0.93. BP low normal. HR low at 64 bpm paced. He is hyponatremic with sodium of 129. Consider inotrope with milrinone if diureses slows.   2. CAD: Last cardiac cath 2001 demonstrating non-obstructive CAD.EF at that time 50%. Continue medical management at this time. Consider repeat nuclear study if symptoms persist. Await echo for changes in LV fx. He states chest pain was more pressure when he was trying to breath. Cardiac markers argue against ACS. Continue ASA and statin.  3. Hypotension: Currently not on any antihypertensive meds. He is on finestride 5 mg daily. Careful diureses to avoid further drop.        Signed: Bettey Mare. Lyman Bishop NP [facility] 08/26/2013, 10:31 AM Co-Sign MD

## 2013-08-26 NOTE — Consult Note (Signed)
The patient was seen and examined, and I agree with the assessment and plan as documented above, with modifications as noted below.  Andre Holder has been admitted with acute systolic heart failure. I personally interpreted his echocardiogram performed today, which revealed his EF to be in the 30-35% range, with no real changes from his prior echo. His aortic stenosis remains mild. He feels much better after the initiation of IV Lasix. He no longer has any chest pressure and says his breathing has improved. His pacemaker appears to be functioning appropriately. Given his tenuous blood pressure, I do not recommend beta blockers or ACEI's at this time. He had non-obstructive CAD by coronary angiography several years ago, and has had negative troponins. Continue present management for the time being.

## 2013-08-26 NOTE — Progress Notes (Signed)
*  PRELIMINARY RESULTS* Echocardiogram 2D Echocardiogram has been performed.  Andre Holder 08/26/2013, 9:52 AM

## 2013-08-26 NOTE — Care Management Note (Signed)
    Page 1 of 1   08/26/2013     3:31:35 PM   CARE MANAGEMENT NOTE 08/26/2013  Patient:  LARRI, YEHLE   Account Number:  [id number]  Date Initiated:  08/26/2013  Documentation initiated by:  Sharrie Rothman  Subjective/Objective Assessment:   Pt admitted from home with [facility]. Pt lives with his wife and will return home at discharge. Pt is connected with [facility] and consent to transfer form faxed to transfer coordinator.     Action/Plan:   Will continue to follow up for discharge planning needs. Will [facility] if [facility].   Anticipated DC Date:  08/29/2013   Anticipated DC Plan:  HOME/SELF CARE      DC Planning Services  CM consult      Choice offered to / List presented to:             Status of service:  Completed, signed off Medicare Important Message given?   (If response is "NO", the following Medicare IM given date fields will be blank) Date Medicare IM given:   Date Additional Medicare IM given:    Discharge Disposition:  HOME/SELF CARE  Per UR Regulation:    If discussed at Long Length of Stay Meetings, dates discussed:    Comments:  08/26/13 1530 Arlyss Queen, RN BSN CM

## 2013-08-27 LAB — BASIC METABOLIC PANEL
BUN: 19 mg/dL (ref 6–23)
CO2: 32 mEq/L (ref 19–32)
Calcium: 9.1 mg/dL (ref 8.4–10.5)
Chloride: 90 mEq/L — ABNORMAL LOW (ref 96–112)
GFR calc Af Amer: 84 mL/min — ABNORMAL LOW (ref >90)
Glucose, Bld: 109 mg/dL — ABNORMAL HIGH (ref 70–99)
Potassium: 4 mEq/L (ref 3.5–5.1)
Sodium: 129 mEq/L — ABNORMAL LOW (ref 135–145)

## 2013-08-27 MED ORDER — MAGNESIUM HYDROXIDE 400 MG/5ML PO SUSP: 30.0000 mL | Freq: Every day | ORAL | Status: DC | PRN

## 2013-08-27 MED ORDER — FUROSEMIDE 40 MG PO TABS: 40.0000 mg | ORAL_TABLET | Freq: Every day | ORAL | Status: DC

## 2013-08-27 MED ORDER — POTASSIUM CHLORIDE CRYS ER 20 MEQ PO TBCR: 20.0000 meq | EXTENDED_RELEASE_TABLET | Freq: Once | ORAL | Status: DC

## 2013-08-27 NOTE — Progress Notes (Signed)
RRT called to replace trach collar for patient because his fell in the trash. When RRT arrived to room, patient stated that he did not need the oxygen and did not want to go back on the trach collar. Spare trach collar left on sink at bedside.

## 2013-08-27 NOTE — Progress Notes (Signed)
Patient with orders to be discharge home. Discharge instructions given, patient verbalized understanding. Prescriptions given. Patient stable. Patient left with spouse in private vehicle.   

## 2013-08-29 NOTE — Discharge Summary (Signed)
NAMEMarland Kitchen  Holder, Andre NO.:  [id number]  MEDICAL RECORD NO.:  [id number]  LOCATION:                                 FACILITY:  PHYSICIAN:  Kingsley Callander. Ouida Sills, MD       DATE OF BIRTH:  June 04, 1923  DATE OF ADMISSION:  08/25/2013 DATE OF DISCHARGE:  12/20/2014LH                              DISCHARGE SUMMARY   DISCHARGE DIAGNOSES: 1. Acute on chronic systolic congestive heart failure. 2. Status post pacemaker placement. 3. Hypothyroidism. 4. Gastroesophageal reflux disease. 5. Hyperlipidemia. 6. Benign prostatic hyperplasia. 7. Carotid artery disease. 8. Aortic stenosis. 9. History of laryngeal carcinoma status post laryngectomy. 10.Hyponatremia. 11.Hyperkalemia. 12.Normocytic anemia.  DISCHARGE MEDICATIONS: 1. Lasix 40 mg daily. 2. Potassium 20 mEq daily. 3. Aspirin 81 mg daily. 4. Vitamin D 1000 units daily. 5. Finasteride 5 mg daily. 6. Gas Relief 15 mL daily p.r.n. 7. Norco 5/325 b.i.d. p.r.n. 8. Levothyroxine 175 mcg daily. 9. Multivitamin daily. 10.Pantoprazole 40 mg daily. 11.Pravastatin 20 mg daily. 12.Tamsulosin 0.4 mg daily. 13.Vitamin C 500 mg daily.  HOSPITAL COURSE:  This patient is an 79 year old male who presented with shortness of breath and dyspnea on exertion.  He was evaluated in the emergency room.  He had an elevated proBNP of 1186.  His chest x-ray revealed pulmonary edema.  He was started on intravenous Lasix.  He responded well.  He had an echocardiogram, which revealed an ejection fraction of 35%.  His aortic stenosis was mild and was not felt to contribute to his current symptoms.  He was hyponatremic with serum sodium, which remained stable at 129.  He was initially hyperkalemic at 5.3, but dropped to 3.7 and 4.0 on subsequent checks.  His renal function is normal.  BUN and creatinine are 19 and 0.94.  He has a normocytic anemia, which is stable with a hemoglobin of 10.6 and MCV of 95.  He was seen in consultation by  Cardiology.  The decision was made to forego initiation of an ACE inhibitor or beta- blocker because of his low normal blood pressures.  The patient has history of nonobstructive coronary disease, which has been stable.  His troponin was negative.  His symptoms are much improved.  He is oxygenating well.  He is stable for discharge on the 20th.  He will be seen in followup in my office in 1 week.  He will be converted to oral Lasix with an oral potassium supplement.  He has been counseled to avoid salt intake.     Kingsley Callander. Ouida Sills, MD     ROF/MEDQ  D:  08/27/2013  T:  08/28/2013  Job:  [id number]

## 2013-08-29 NOTE — H&P (Signed)
NAMEMarland Kitchen  PORFIRIO, BOLLIER NO.:  [id number]  MEDICAL RECORD NO.:  [id number]  LOCATION:                                 FACILITY:  PHYSICIAN:  Kingsley Callander. Ouida Sills, MD       DATE OF BIRTH:  1922/12/19  DATE OF ADMISSION:  08/25/2013 DATE OF DISCHARGE:  12/20/2014LH                             HISTORY & PHYSICAL   CHIEF COMPLAINT:  Shortness of breath.  HISTORY OF PRESENT ILLNESS:  This patient is an 79 year old, white male, who presented to the emergency room with increasing shortness of breath for several days prior to admission.  He had dyspnea on exertion.  He did not have paroxysmal nocturnal dyspnea or orthopnea.  He had noted some leg swelling.  He has a history of mild aortic stenosis and nonobstructive coronary artery disease by catheterization.  He had been having some intermittent chest pains.  He was evaluated in the emergency room, where his troponin was normal.  He was found to be in new onset heart failure, however, and was therefore admitted for IV diuretic therapy and further assessment.  PAST MEDICAL HISTORY: 1. Laryngeal carcinoma, status post laryngectomy. 2. Nonobstructive CAD. 3. Aortic stenosis. 4. Peripheral vascular disease. 5. Hypothyroidism. 6. Osteoarthritis, status post knee replacement. 7. GERD. 8. Hyperlipidemia. 9. Skin cancer. 10.BPH.  MEDICATIONS: 1. Aspirin 81 mg daily. 2. Vitamin D 1000 units daily. 3. Proscar 5 mg daily. 4. Norco 5/325 b.i.d. p.r.n. 5. Synthroid 175 mcg daily. 6. Multivitamin daily. 7. Protonix 40 mg daily. 8. Pravastatin 20 mg daily 9. Gas-X p.r.n. 10.Flomax 0.4 mg daily. 11.Vitamin C 500 mg daily.  ALLERGIES:  No known drug allergies.  SOCIAL HISTORY:  He does not use tobacco, alcohol, or recreational substances.  FAMILY HISTORY:  Remarkable for stroke in his mother and leukemia in his father.  REVIEW OF SYSTEMS:  At the time of my exam, he had no symptoms of chest pain.  He had recently  experienced cough.  No fever.  No difficulty eating or difficulty voiding.  Bowel habits are stable.  PHYSICAL EXAMINATION:  VITAL SIGNS:  Temperature 98.2, pulse 63, blood pressure 94/52, oxygen saturation 93%. GENERAL:  Alert and in no distress. HEENT:  Eyes, nose, and oropharynx are unremarkable. NECK:  Stable tracheostomy.  No masses in the neck. LUNGS:  Bibasilar rales are present.  No wheezes. HEART:  Regular with a grade 2 systolic murmur.  Chest wall is tender anteriorly. ABDOMEN:  Soft and nontender with no palpable organomegaly. EXTREMITIES:  Trace edema in the feet.  Pulses intact. NEURO:  At baseline. LYMPH NODES:  No enlargement. SKIN:  Warm and dry.  LABORATORY DATA:  Sodium 129, potassium 5.3, bicarb 28, BUN 16, creatinine 0.84, calcium 9.1, AST 28, albumin 3.6.  ProBNP 1186, troponin I less than 0.30.  White count 8.1, hemoglobin 10.6, platelets 214,000, 78 segs, 11 lymphs.  Glucose 104.  The chest x-ray reveals pulmonary edema.  The EKG reveals a paced ventricular rhythm.  IMPRESSION/PLAN: 1. New onset congestive heart failure.  He will be treated with IV     Lasix.  He will be further assessed with an echocardiogram and  Cardiology consultation. 2. Aortic stenosis.  Repeat echo. 3. Hyponatremia, will follow. 4. Hyperkalemia, recheck after Lasix. 5. Normocytic anemia. 6. History of laryngeal carcinoma. 7. Nonobstructive coronary artery disease by catheterization. 8. Recent noncardiac chest pain.  Cardiac enzymes are negative.  He     does have chest wall tenderness on palpation. 9. Hypothyroidism.  He is euthyroid. 10.Peripheral vascular disease.  His carotids have been followed at     the [place] and the decision was made not to proceed with surgery. 11.Gastroesophageal reflux disease.  Continue Protonix.    Kingsley Callander. Ouida Sills, MD    ROF/MEDQ  D:  08/26/2013  T:  08/27/2013  Job:  [id number]

## 2013-08-29 NOTE — Progress Notes (Signed)
NAMEMarland Kitchen  GLENDON, DUNWOODY NO.:  [id number]  MEDICAL RECORD NO.:  [id number]  LOCATION:                                 FACILITY:  PHYSICIAN:  Kingsley Callander. Ouida Sills, MD       DATE OF BIRTH:  1923/08/22  DATE OF PROCEDURE:  08/26/2013 DATE OF DISCHARGE:  08/27/2013                                PROGRESS NOTE   HISTORY:  Mr. Cavitt was admitted yesterday with new onset heart failure.  He has been treated with IV Lasix.  He is breathing comfortably this morning.  He has diuresed 800 mL.  His weight is down from 185-182.  PHYSICAL EXAMINATION:  VITAL SIGNS:  Temperature 98.2, pulse 79, blood pressure 117/63, oxygen saturation 100%. LUNGS:  Basilar rales are present. HEART:  Regular with a grade 2 systolic murmur. ABDOMEN:  Soft and nontender. EXTREMITIES:  Reveal no edema.  IMPRESSION/PLAN: 1. New onset congestive heart failure.  Echo today.  Cardiology     consult today.  Continue IV Lasix. 2. Hyponatremia, serum sodium is 129.  Metabolic panel pending. 3. Hyperkalemia, serum potassium was 5.3, again metabolic profile is     pending. 4. Normocytic anemia.  No evidence of bleeding. 5. History of laryngeal carcinoma, status post laryngectomy, stable. 6. Aortic stenosis.  Reassess by echo. 7. Status post pacemaker placement.     Kingsley Callander. Ouida Sills, MD     ROF/MEDQ  D:  08/26/2013  T:  08/27/2013  Job:  [id number]

## 2014-02-10 ENCOUNTER — Encounter (HOSPITAL_COMMUNITY): Payer: Self-pay | Admitting: Emergency Medicine

## 2014-02-10 ENCOUNTER — Emergency Department (HOSPITAL_COMMUNITY)
Admission: EM | Admit: 2014-02-10 | Discharge: 2014-02-10 | Disposition: A | Discharge status: Home / Self Care | Payer: Medicare HMO | Attending: Emergency Medicine | Admitting: Emergency Medicine

## 2014-02-10 ENCOUNTER — Emergency Department (HOSPITAL_COMMUNITY): Payer: Medicare HMO

## 2014-02-10 DIAGNOSIS — Z87891 Personal history of nicotine dependence: Secondary | ICD-10-CM | POA: Insufficient documentation

## 2014-02-10 DIAGNOSIS — Z8521 Personal history of malignant neoplasm of larynx: Secondary | ICD-10-CM | POA: Insufficient documentation

## 2014-02-10 DIAGNOSIS — R5383 Other fatigue: Secondary | ICD-10-CM

## 2014-02-10 DIAGNOSIS — R5381 Other malaise: Secondary | ICD-10-CM | POA: Insufficient documentation

## 2014-02-10 DIAGNOSIS — I739 Peripheral vascular disease, unspecified: Secondary | ICD-10-CM | POA: Insufficient documentation

## 2014-02-10 DIAGNOSIS — K219 Gastro-esophageal reflux disease without esophagitis: Secondary | ICD-10-CM | POA: Insufficient documentation

## 2014-02-10 DIAGNOSIS — R1031 Right lower quadrant pain: Secondary | ICD-10-CM | POA: Insufficient documentation

## 2014-02-10 DIAGNOSIS — M542 Cervicalgia: Secondary | ICD-10-CM | POA: Insufficient documentation

## 2014-02-10 DIAGNOSIS — I359 Nonrheumatic aortic valve disorder, unspecified: Secondary | ICD-10-CM | POA: Insufficient documentation

## 2014-02-10 DIAGNOSIS — E039 Hypothyroidism, unspecified: Secondary | ICD-10-CM | POA: Insufficient documentation

## 2014-02-10 DIAGNOSIS — E785 Hyperlipidemia, unspecified: Secondary | ICD-10-CM | POA: Insufficient documentation

## 2014-02-10 DIAGNOSIS — Z79899 Other long term (current) drug therapy: Secondary | ICD-10-CM | POA: Insufficient documentation

## 2014-02-10 DIAGNOSIS — I2510 Atherosclerotic heart disease of native coronary artery without angina pectoris: Secondary | ICD-10-CM | POA: Insufficient documentation

## 2014-02-10 DIAGNOSIS — Z8739 Personal history of other diseases of the musculoskeletal system and connective tissue: Secondary | ICD-10-CM | POA: Insufficient documentation

## 2014-02-10 DIAGNOSIS — I441 Atrioventricular block, second degree: Secondary | ICD-10-CM | POA: Insufficient documentation

## 2014-02-10 DIAGNOSIS — Z7982 Long term (current) use of aspirin: Secondary | ICD-10-CM | POA: Insufficient documentation

## 2014-02-10 DIAGNOSIS — Z85828 Personal history of other malignant neoplasm of skin: Secondary | ICD-10-CM | POA: Insufficient documentation

## 2014-02-10 DIAGNOSIS — Z950 Presence of cardiac pacemaker: Secondary | ICD-10-CM | POA: Insufficient documentation

## 2014-02-10 DIAGNOSIS — Q791 Other congenital malformations of diaphragm: Secondary | ICD-10-CM | POA: Insufficient documentation

## 2014-02-10 DIAGNOSIS — E259 Adrenogenital disorder, unspecified: Secondary | ICD-10-CM | POA: Insufficient documentation

## 2014-02-10 LAB — COMPREHENSIVE METABOLIC PANEL
ALK PHOS: 87 U/L (ref 39–117)
ALT: 23 U/L (ref 0–53)
AST: 28 U/L (ref 0–37)
Albumin: 3.5 g/dL (ref 3.5–5.2)
BUN: 26 mg/dL — ABNORMAL HIGH (ref 6–23)
CALCIUM: 9.2 mg/dL (ref 8.4–10.5)
CO2: 28 meq/L (ref 19–32)
Chloride: 98 mEq/L (ref 96–112)
Creatinine, Ser: 0.83 mg/dL (ref 0.50–1.35)
GFR calc Af Amer: 88 mL/min — ABNORMAL LOW (ref >90)
GFR calc non Af Amer: 76 mL/min — ABNORMAL LOW (ref >90)
GLUCOSE: 101 mg/dL — AB (ref 70–99)
Potassium: 4.7 mEq/L (ref 3.7–5.3)
Sodium: 137 mEq/L (ref 137–147)
TOTAL PROTEIN: 7 g/dL (ref 6.0–8.3)
Total Bilirubin: 0.4 mg/dL (ref 0.3–1.2)

## 2014-02-10 LAB — CBC WITH DIFFERENTIAL/PLATELET
BASOS ABS: 0 10*3/uL (ref 0.0–0.1)
Basophils Relative: 1 % (ref 0–1)
EOS ABS: 0.4 10*3/uL (ref 0.0–0.7)
EOS PCT: 6 % — AB (ref 0–5)
HCT: 33.9 % — ABNORMAL LOW (ref 39.0–52.0)
Hemoglobin: 11.6 g/dL — ABNORMAL LOW (ref 13.0–17.0)
LYMPHS ABS: 1.5 10*3/uL (ref 0.7–4.0)
LYMPHS PCT: 27 % (ref 12–46)
MCH: 33.1 pg (ref 26.0–34.0)
MCHC: 34.2 g/dL (ref 30.0–36.0)
MCV: 96.9 fL (ref 78.0–100.0)
Monocytes Absolute: 0.7 10*3/uL (ref 0.1–1.0)
Monocytes Relative: 12 % (ref 3–12)
NEUTROS PCT: 54 % (ref 43–77)
Neutro Abs: 3.1 10*3/uL (ref 1.7–7.7)
Platelets: 224 10*3/uL (ref 150–400)
RBC: 3.5 MIL/uL — AB (ref 4.22–5.81)
RDW: 13.5 % (ref 11.5–15.5)
WBC: 5.7 10*3/uL (ref 4.0–10.5)

## 2014-02-10 LAB — URINALYSIS, ROUTINE W REFLEX MICROSCOPIC
BILIRUBIN URINE: NEGATIVE
GLUCOSE, UA: NEGATIVE mg/dL
HGB URINE DIPSTICK: NEGATIVE
KETONES UR: NEGATIVE mg/dL
Leukocytes, UA: NEGATIVE
Nitrite: NEGATIVE
PH: 5.5 (ref 5.0–8.0)
Protein, ur: NEGATIVE mg/dL
SPECIFIC GRAVITY, URINE: 1.01 (ref 1.005–1.030)
Urobilinogen, UA: 0.2 mg/dL (ref 0.0–1.0)

## 2014-02-10 MED ORDER — IOHEXOL 300 MG/ML  SOLN
100.0000 mL | Freq: Once | INTRAMUSCULAR | Status: AC | PRN
  Administered 2014-02-10: 100 mL via INTRAVENOUS

## 2014-02-10 MED ORDER — SODIUM CHLORIDE 0.9 % IV SOLN
Freq: Once | INTRAVENOUS | Status: AC
  Administered 2014-02-10: 17:00:00 via INTRAVENOUS

## 2014-02-10 MED ORDER — IOHEXOL 300 MG/ML  SOLN
50.0000 mL | Freq: Once | INTRAMUSCULAR | Status: AC | PRN
  Administered 2014-02-10: 50 mL via ORAL

## 2014-02-10 NOTE — ED Notes (Signed)
Pt states he has a blockage in his neck and was told to come to the ED if he had any symptoms. Pt states pain to left side of the neck. Pain is on the same side as the blockage.

## 2014-02-10 NOTE — ED Provider Notes (Signed)
CSN: [id number]     Arrival date & time 02/10/14  1607 History  This chart was scribed for Maudry Diego, MD by Rolanda Lundborg, ED Scribe. This patient was seen in room APA01/APA01 and the patient's care was started at 4:54 PM.    Chief Complaint  Patient presents with  . Neck Pain   Patient is a 80 y.o. male presenting with musculoskeletal pain. The history is provided by the patient. No language interpreter was used.  Muscle Pain This is a new problem. The current episode started yesterday. The problem has not changed since onset.Associated symptoms include abdominal pain. Pertinent negatives include no chest pain and no headaches. Nothing aggravates the symptoms.   HPI Comments: Andre Holder is a 80 y.o. male who presents to the Emergency Department complaining of moderate right-sided neck pain onset yesterday with associated fatigue. He also reports RLQ abdominal pain.  PCP - Dr Joneen Caraway at the [facility]   Past Medical History  Diagnosis Date  . Arteriosclerotic cardiovascular disease (ASCVD)     Nonobstructive; 09/2008 50% proximal and 40% mid LAD; 25% circumflex; 30% RCA; mild global LV dysfunction with EF of 45%. No aortic stenosis.  . Mild aortic stenosis     not documented at catheterization; verified by echo in 2011  . Peripheral vascular disease     With a 70% innominate artery stenosis and nonobstructive carotid stenosis  . Hypothyroidism   . Degenerative joint disease     s/p bilateral TKR  . Mobitz (type) II atrioventricular block     With bradycardia; Medtronic pacemaker implanted in 09/2008  . Tobacco abuse, in remission     Remote  . GERD (gastroesophageal reflux disease)   . Hyperlipidemia     Lipid profile in 04/2010:115, 98, 43, 52.  . Cancer of larynx     laryngectomy in 1988; postoperative radiation therapy  . Weight loss     50 pounds between 1991 and 2011  . 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 Adrenal hyperplasia      Stable on serial imaging  . Anemia     minimal in 2011 with hemoglobin of 12.2 and high normal MCV  . Borderline hypertension     Normal CMet in 2011  . Skin cancer    Past Surgical History  Procedure Laterality Date  . Laryngectomy  1988    S/P laryngectomy and radiation therapy  . Appendectomy  1973  . Knee arthroscopy      Left  . Total knee arthroplasty      Bilateral, 19 years ago  . Cataract extraction, bilateral    . Decompression facial nerve      Right median  . Pacemaker insertion    . Insert / replace / remove pacemaker     Family History  Problem Relation Age of Onset  . Stroke Mother   . Leukemia Father   . Colon cancer Neg Hx   . Liver disease Neg Hx   . GI problems Neg Hx   . Stroke Other   . Diabetes Other    History  Substance Use Topics  . Smoking status: Former Smoker    Types: Cigarettes  . Smokeless tobacco: Former Systems developer    Quit date: 09/08/1982     Comment: Quit 30 years  . Alcohol Use: No    Review of Systems  Constitutional: Positive for fatigue. Negative for appetite change.  HENT: Negative for  congestion, ear discharge and sinus pressure.   Eyes: Negative for discharge.  Respiratory: Negative for cough.   Cardiovascular: Negative for chest pain.  Gastrointestinal: Positive for abdominal pain. Negative for diarrhea.  Genitourinary: Negative for frequency and hematuria.  Musculoskeletal: Positive for neck pain. Negative for back pain.  Skin: Negative for rash.  Neurological: Negative for seizures and headaches.  Psychiatric/Behavioral: Negative for hallucinations.      Allergies  Review of patient's allergies indicates no known allergies.  Home Medications   Prior to Admission medications   Medication Sig Start Date End Date Taking? Authorizing Provider  aspirin EC 81 MG tablet Take 81 mg by mouth daily.    Historical Provider, MD  cholecalciferol (VITAMIN D) 1000 UNITS tablet Take 1,000 Units by mouth daily.    Historical  Provider, MD  finasteride (PROSCAR) 5 MG tablet Take 5 mg by mouth at bedtime.    Historical Provider, MD  furosemide (LASIX) 40 MG tablet Take 1 tablet (40 mg total) by mouth daily. 08/27/13   Asencion Noble, MD  HYDROcodone-acetaminophen (NORCO/VICODIN) 5-325 MG per tablet Take 1 tablet by mouth 2 (two) times daily as needed for moderate pain.  07/15/13   Historical Provider, MD  levothyroxine (SYNTHROID, LEVOTHROID) 175 MCG tablet Take 175 mcg by mouth daily.    Historical Provider, MD  Multiple Vitamin (MULTIVITAMIN WITH MINERALS) TABS Take 1 tablet by mouth daily.    Historical Provider, MD  pantoprazole (PROTONIX) 40 MG tablet Take 1 tablet by mouth daily. 07/21/13   Historical Provider, MD  potassium chloride SA (K-DUR,KLOR-CON) 20 MEQ tablet Take 1 tablet (20 mEq total) by mouth once. 08/27/13   Asencion Noble, MD  pravastatin (PRAVACHOL) 20 MG tablet Take 10 mg by mouth at bedtime.    Historical Provider, MD  Simethicone (GAS RELIEF PO) Take 15 mLs by mouth daily as needed (gas).    Historical Provider, MD  Tamsulosin HCl (FLOMAX) 0.4 MG CAPS Take 0.4 mg by mouth at bedtime.    Historical Provider, MD  vitamin C (ASCORBIC ACID) 500 MG tablet Take 500 mg by mouth daily.    Historical Provider, MD   BP 118/57  Pulse 64  Temp(Src) 98.1 F (36.7 C) (Oral)  Resp 22  SpO2 98% Physical Exam  Constitutional: He is oriented to person, place, and time. He appears well-developed.  HENT:  Head: Normocephalic.  Eyes: Conjunctivae and EOM are normal. No scleral icterus.  Neck: Neck supple. No thyromegaly present.  moderate right anterior lateral neck tenderness. Stoma in place  Cardiovascular: Normal rate and regular rhythm.  Exam reveals no gallop and no friction rub.   No murmur heard. Pulmonary/Chest: No stridor. He has no wheezes. He has no rales. He exhibits no tenderness.  Abdominal: He exhibits no distension. There is tenderness (RLQ). There is no rebound.  Musculoskeletal: Normal range of  motion. He exhibits no edema.  Lymphadenopathy:    He has no cervical adenopathy.  Neurological: He is oriented to person, place, and time. He exhibits normal muscle tone. Coordination normal.  Skin: No rash noted. No erythema.  Psychiatric: He has a normal mood and affect. His behavior is normal.    ED Course  Procedures (including critical care time) Medications  0.9 %  sodium chloride infusion ( Intravenous New Bag/Given 02/10/14 1720)  iohexol (OMNIPAQUE) 300 MG/ML solution 50 mL (50 mLs Oral Contrast Given 02/10/14 1829)  iohexol (OMNIPAQUE) 300 MG/ML solution 100 mL (100 mLs Intravenous Contrast Given 02/10/14 1936)    DIAGNOSTIC  STUDIES: Oxygen Saturation is 98% on RA, normal by my interpretation.    COORDINATION OF CARE: 5:04 PM- Discussed treatment plan with pt. Pt agrees to plan.    Labs Review Labs Reviewed  CBC WITH DIFFERENTIAL - Abnormal; Notable for the following:    RBC 3.50 (*)    Hemoglobin 11.6 (*)    HCT 33.9 (*)    Eosinophils Relative 6 (*)    All other components within normal limits  COMPREHENSIVE METABOLIC PANEL - Abnormal; Notable for the following:    Glucose, Bld 101 (*)    BUN 26 (*)    GFR calc non Af Amer 76 (*)    GFR calc Af Amer 88 (*)    All other components within normal limits  URINALYSIS, ROUTINE W REFLEX MICROSCOPIC    Imaging Review Ct Soft Tissue Neck W Contrast  02/10/2014   CLINICAL DATA:  Throat soreness.  History of throat cancer.  EXAM: CT NECK WITH CONTRAST  TECHNIQUE: Multidetector CT imaging of the neck was performed using the standard protocol following the bolus administration of intravenous contrast.  CONTRAST:  35mL OMNIPAQUE IOHEXOL 300 MG/ML SOLN, 158mL OMNIPAQUE IOHEXOL 300 MG/ML SOLN  COMPARISON:  Chest CT of the same day.  FINDINGS: The patient is status post right-sided modified radical neck dissection. The right internal jugular vein has been resected. Portions of the sternocleidomastoid muscle have been resected.  Postradiation changes are noted in the neck as well. Dense atherosclerotic calcifications are present within the carotid arteries bilaterally. A moderate stenosis is present on the left.  The patient is status post total laryngectomy. A tracheostomy is noted.  No comparison studies of the neck are available. No significant soft tissue mass is present to suggest residual or recurrent disease.  Limited imaging of the brain is unremarkable. There is significant loss of disc height across multiple levels from the C3-4 through C7-T1. Moderate facet degenerative changes are noted on the left at C2-3. No focal lytic or blastic lesions are present.  IMPRESSION: 1. Status post total laryngectomy and modified right radical neck dissection. 2. No evidence for residual or recurrent tumor. 3. No acute abnormality. 4. Extensive atherosclerotic disease with left greater than right carotid artery stenosis. 5. Moderate spondylosis of the cervical spine.   Electronically Signed   By: Lawrence Santiago M.D.   On: 02/10/2014 20:17   Ct Abdomen Pelvis W Contrast  02/10/2014   CLINICAL DATA:  Lower abdominal pain and tenderness.  EXAM: CT ABDOMEN AND PELVIS WITH CONTRAST  TECHNIQUE: Multidetector CT imaging of the abdomen and pelvis was performed using the standard protocol following bolus administration of intravenous contrast.  CONTRAST:  64mL OMNIPAQUE IOHEXOL 300 MG/ML SOLN, 13mL OMNIPAQUE IOHEXOL 300 MG/ML SOLN  COMPARISON:  05/21/2010.  FINDINGS: Lung bases show subpleural reticulation, worsened from 05/21/2010. A 9 mm (7 x 10 mm) nodule in the lingula is not seen on prior exams (image 9, series 8). Ascending aorta measures 4.4 cm, stable. Extensive 3 vessel coronary artery calcification. Heart is enlarged. No pericardial or pleural effusion.  Liver, gallbladder and right adrenal gland are unremarkable. A 1.9 x 2.9 cm low-attenuation lesion in the left adrenal gland is unchanged. Kidneys, spleen, pancreas and stomach are  unremarkable. Mild prominence of proximal and mid small bowel without a discrete transition point to indicate obstruction. Possible small pelvic free fluid versus fluid-filled small bowel (series 7, image 83).  Prostate is enlarged but is incompletely imaged. Bladder is grossly unremarkable. Atherosclerotic calcification of the arterial  vasculature without an abdominal aortic aneurysm. Tubular low-attenuation in the region of the left inguinal canal (image 88) is unchanged. No worrisome lytic or sclerotic lesions. Degenerative changes are seen in the spine. Bilateral L5 pars defects with grade 2 anterolisthesis of L5 on S1.  IMPRESSION: 1. No findings to explain the patient's pain. 2. Worsening bibasilar subpleural pulmonary fibrosis. 3. 9 mm lingular nodule. Follow-up CT chest without contrast in 3 months is recommended, as clinically indicated. This recommendation follows ACR consensus guidelines: White Paper of the ACR Incidental Findings Committee II on Splenic and Nodal Findings. J Am Coll Radiol 8850;27:741-287. 4. Extensive 3 vessel coronary artery calcification. 5. Ascending aortic aneurysm, stable. 6. Left adrenal adenoma, stable. 7. Enlarged prostate.   Electronically Signed   By: Lorin Picket M.D.   On: 02/10/2014 19:56     EKG Interpretation None      MDM   Final diagnoses:  None   Neck pain and abd pain with nl ct. And blood.  Pt to follow up with pcp  The chart was scribed for me under my direct supervision.  I personally performed the history, physical, and medical decision making and all procedures in the evaluation of this patient.Maudry Diego, MD 02/10/14 2030

## 2014-02-10 NOTE — ED Notes (Signed)
Pt reports pain to throat x2-3 days. Pt states he had throat cancer removed 30 years ago and has used voice box since. Pt also reports pain RLQ and is tender to touch in area.

## 2014-02-10 NOTE — Discharge Instructions (Signed)
Continue your present pain medicine and follow up with dr. Willey Blade next week.

## 2014-05-18 ENCOUNTER — Other Ambulatory Visit (HOSPITAL_COMMUNITY): Payer: Self-pay | Admitting: Internal Medicine

## 2014-05-18 DIAGNOSIS — R911 Solitary pulmonary nodule: Secondary | ICD-10-CM

## 2014-05-18 DIAGNOSIS — J84112 Idiopathic pulmonary fibrosis: Secondary | ICD-10-CM

## 2014-05-23 ENCOUNTER — Ambulatory Visit (HOSPITAL_COMMUNITY)
Admission: RE | Admit: 2014-05-23 | Discharge: 2014-05-23 | Disposition: A | Discharge status: Home / Self Care | Payer: Medicare HMO | Source: Ambulatory Visit | Attending: Internal Medicine | Admitting: Internal Medicine

## 2014-05-23 DIAGNOSIS — R911 Solitary pulmonary nodule: Secondary | ICD-10-CM

## 2014-05-23 DIAGNOSIS — J8410 Pulmonary fibrosis, unspecified: Secondary | ICD-10-CM | POA: Insufficient documentation

## 2014-05-23 DIAGNOSIS — E279 Disorder of adrenal gland, unspecified: Secondary | ICD-10-CM | POA: Diagnosis not present

## 2014-05-23 DIAGNOSIS — J84112 Idiopathic pulmonary fibrosis: Secondary | ICD-10-CM

## 2014-05-23 DIAGNOSIS — I719 Aortic aneurysm of unspecified site, without rupture: Secondary | ICD-10-CM | POA: Diagnosis not present

## 2014-07-17 ENCOUNTER — Emergency Department (HOSPITAL_COMMUNITY)
Admission: EM | Admit: 2014-07-17 | Discharge: 2014-07-18 | Disposition: A | Discharge status: Home / Self Care | Payer: Non-veteran care | Attending: Emergency Medicine | Admitting: Emergency Medicine

## 2014-07-17 ENCOUNTER — Encounter (HOSPITAL_COMMUNITY): Payer: Self-pay

## 2014-07-17 ENCOUNTER — Emergency Department (HOSPITAL_COMMUNITY): Payer: Non-veteran care

## 2014-07-17 DIAGNOSIS — Z7982 Long term (current) use of aspirin: Secondary | ICD-10-CM | POA: Insufficient documentation

## 2014-07-17 DIAGNOSIS — Z85828 Personal history of other malignant neoplasm of skin: Secondary | ICD-10-CM | POA: Insufficient documentation

## 2014-07-17 DIAGNOSIS — Z79899 Other long term (current) drug therapy: Secondary | ICD-10-CM | POA: Diagnosis not present

## 2014-07-17 DIAGNOSIS — M1990 Unspecified osteoarthritis, unspecified site: Secondary | ICD-10-CM | POA: Diagnosis not present

## 2014-07-17 DIAGNOSIS — Z87891 Personal history of nicotine dependence: Secondary | ICD-10-CM | POA: Insufficient documentation

## 2014-07-17 DIAGNOSIS — K219 Gastro-esophageal reflux disease without esophagitis: Secondary | ICD-10-CM | POA: Insufficient documentation

## 2014-07-17 DIAGNOSIS — Z9889 Other specified postprocedural states: Secondary | ICD-10-CM | POA: Insufficient documentation

## 2014-07-17 DIAGNOSIS — Q791 Other congenital malformations of diaphragm: Secondary | ICD-10-CM | POA: Insufficient documentation

## 2014-07-17 DIAGNOSIS — E039 Hypothyroidism, unspecified: Secondary | ICD-10-CM | POA: Insufficient documentation

## 2014-07-17 DIAGNOSIS — E785 Hyperlipidemia, unspecified: Secondary | ICD-10-CM | POA: Diagnosis not present

## 2014-07-17 DIAGNOSIS — R0602 Shortness of breath: Secondary | ICD-10-CM | POA: Diagnosis present

## 2014-07-17 DIAGNOSIS — M542 Cervicalgia: Secondary | ICD-10-CM | POA: Insufficient documentation

## 2014-07-17 DIAGNOSIS — Z950 Presence of cardiac pacemaker: Secondary | ICD-10-CM | POA: Diagnosis not present

## 2014-07-17 DIAGNOSIS — I2510 Atherosclerotic heart disease of native coronary artery without angina pectoris: Secondary | ICD-10-CM | POA: Diagnosis not present

## 2014-07-17 DIAGNOSIS — R079 Chest pain, unspecified: Secondary | ICD-10-CM | POA: Diagnosis not present

## 2014-07-17 DIAGNOSIS — Z8521 Personal history of malignant neoplasm of larynx: Secondary | ICD-10-CM | POA: Diagnosis not present

## 2014-07-17 NOTE — ED Provider Notes (Signed)
CSN: [id number]     Arrival date & time 07/17/14  2311 History  This chart was scribe for Nat Christen, MD by Judithann Sauger, ED Scribe. The patient was seen in room APA18/APA18 and the patient's care was started at 11:23 PM.    Chief Complaint  Patient presents with  . Shortness of Breath   The history is provided by the patient. No language interpreter was used.   HPI Comments: Andre Holder is a 80 y.o. male who presents to the Emergency Department complaining of discomfort due to a sense of fullness in his left lateral neck onset 1 week ago. He denies an URI.  He is status post laryngectomy in 1988 for laryngeal cancer.Marland Kitchen  He reports the ability to swallow, eat and drink but feels as though it is hard to get around the "blockage" in his throat. He states that he is a former smoker.no fever, chills, chest pain, dyspnea  Past Medical History  Diagnosis Date  . Arteriosclerotic cardiovascular disease (ASCVD)     Nonobstructive; 09/2008 50% proximal and 40% mid LAD; 25% circumflex; 30% RCA; mild global LV dysfunction with EF of 45%. No aortic stenosis.  . Mild aortic stenosis     not documented at catheterization; verified by echo in 2011  . Peripheral vascular disease     With a 70% innominate artery stenosis and nonobstructive carotid stenosis  . Hypothyroidism   . Degenerative joint disease     s/p bilateral TKR  . Mobitz (type) II atrioventricular block     With bradycardia; Medtronic pacemaker implanted in 09/2008  . Tobacco abuse, in remission     Remote  . GERD (gastroesophageal reflux disease)   . Hyperlipidemia     Lipid profile in 04/2010:115, 98, 43, 52.  . Cancer of larynx     laryngectomy in 1988; postoperative radiation therapy  . Weight loss     50 pounds between 1991 and 2011  . 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 Adrenal hyperplasia     Stable on serial imaging  . Anemia     minimal in 2011 with hemoglobin of 12.2  and high normal MCV  . Borderline hypertension     Normal CMet in 2011  . Skin cancer    Past Surgical History  Procedure Laterality Date  . Laryngectomy  1988    S/P laryngectomy and radiation therapy  . Appendectomy  1973  . Knee arthroscopy      Left  . Total knee arthroplasty      Bilateral, 19 years ago  . Cataract extraction, bilateral    . Decompression facial nerve      Right median  . Pacemaker insertion    . Insert / replace / remove pacemaker     Family History  Problem Relation Age of Onset  . Stroke Mother   . Leukemia Father   . Colon cancer Neg Hx   . Liver disease Neg Hx   . GI problems Neg Hx   . Stroke Other   . Diabetes Other    History  Substance Use Topics  . Smoking status: Former Smoker    Types: Cigarettes  . Smokeless tobacco: Former Systems developer    Quit date: 09/08/1982     Comment: Quit 30 years  . Alcohol Use: No    Review of Systems  Constitutional: Negative for fever.  Cardiovascular: Negative for chest pain.  Gastrointestinal: Negative for  nausea and vomiting.      Allergies  Review of patient's allergies indicates no known allergies.  Home Medications   Prior to Admission medications   Medication Sig Start Date End Date Taking? Authorizing Provider  aspirin EC 81 MG tablet Take 81 mg by mouth daily.    Historical Provider, MD  B Complex-C (B-COMPLEX WITH VITAMIN C) tablet Take 1 tablet by mouth daily.    Historical Provider, MD  cholecalciferol (VITAMIN D) 1000 UNITS tablet Take 1,000 Units by mouth daily.    Historical Provider, MD  docusate sodium (COLACE) 100 MG capsule Take 100 mg by mouth 2 (two) times daily.    Historical Provider, MD  finasteride (PROSCAR) 5 MG tablet Take 5 mg by mouth at bedtime.    Historical Provider, MD  HYDROcodone-acetaminophen (NORCO/VICODIN) 5-325 MG per tablet Take 1 tablet by mouth every 8 (eight) hours as needed for moderate pain or severe pain.  07/15/13   Historical Provider, MD  levothyroxine  (SYNTHROID, LEVOTHROID) 175 MCG tablet Take 175 mcg by mouth daily.    Historical Provider, MD  magnesium oxide (MAG-OX) 400 MG tablet Take 400 mg by mouth 2 (two) times daily.    Historical Provider, MD  methocarbamol (ROBAXIN) 750 MG tablet Take 750 mg by mouth 2 (two) times daily.    Historical Provider, MD  Multiple Vitamin (MULTIVITAMIN WITH MINERALS) TABS Take 1 tablet by mouth daily.    Historical Provider, MD  omeprazole (PRILOSEC) 20 MG capsule Take 20 mg by mouth daily.    Historical Provider, MD  Tamsulosin HCl (FLOMAX) 0.4 MG CAPS Take 0.4 mg by mouth 2 (two) times daily.     Historical Provider, MD  vitamin C (ASCORBIC ACID) 500 MG tablet Take 500 mg by mouth daily.    Historical Provider, MD   BP 136/64 mmHg  Pulse 60  Temp(Src) 98.2 F (36.8 C)  Resp 11  Ht 6\' 2"  (1.88 m)  Wt 170 lb (77.111 kg)  BMI 21.82 kg/m2  SpO2 92% Physical Exam  Constitutional: He is oriented to person, place, and time. He appears well-developed and well-nourished.  HENT:  Head: Normocephalic and atraumatic.  Eyes: Conjunctivae and EOM are normal. Pupils are equal, round, and reactive to light.  Neck: Normal range of motion. Neck supple.  No masses or tenderness  Cardiovascular: Normal rate, regular rhythm and normal heart sounds.   Pulmonary/Chest: Effort normal and breath sounds normal.  Abdominal: Soft. Bowel sounds are normal.  Musculoskeletal: Normal range of motion.  Neurological: He is alert and oriented to person, place, and time.  Skin: Skin is warm and dry.  Psychiatric: He has a normal mood and affect. His behavior is normal.  Nursing note and vitals reviewed.   ED Course  Procedures (including critical care time) DIAGNOSTIC STUDIES: Oxygen Saturation is 90% on RA, low by my interpretation.    COORDINATION OF CARE: 11:30 PM- Pt advised of plan for treatment and pt agrees. Discussed a CT scan of the neck  Labs Review Labs Reviewed  BASIC METABOLIC PANEL - Abnormal; Notable  for the following:    Sodium 128 (*)    Chloride 93 (*)    GFR calc non Af Amer 79 (*)    All other components within normal limits  CBC WITH DIFFERENTIAL - Abnormal; Notable for the following:    RBC 2.94 (*)    Hemoglobin 9.8 (*)    HCT 28.2 (*)    Eosinophils Relative 7 (*)    All  other components within normal limits  PRO B NATRIURETIC PEPTIDE - Abnormal; Notable for the following:    Pro B Natriuretic peptide (BNP) 1363.0 (*)    All other components within normal limits  TROPONIN I  POC OCCULT BLOOD, ED    Imaging Review Dg Chest 2 View  07/18/2014   CLINICAL DATA:  Cough. Left-sided neck swelling. Duration of symptoms 1 week. History of vascular disease and laryngeal cancer. Pacemaker.  EXAM: CHEST  2 VIEW  COMPARISON:  08/25/2013  FINDINGS: Dual lead pacemaker appears unchanged. The heart is mildly enlarged. There is calcification of the thoracic aorta. There is chronic elevation of the left hemidiaphragm. There is chronic interstitial pulmonary scarring in both lower lungs and to a lesser extent at the lung apices. No sign of dense consolidation, collapse or measurable effusion. Certainly, mild pneumonia could be hidden within the extensive scarring.  IMPRESSION: Extensive chronic pulmonary scarring. No definite pneumonia visible. However, mild pneumonia could be hidden within the abnormal interstitial lung density.   Electronically Signed   By: Nelson Chimes M.D.   On: 07/18/2014 02:56   Ct Soft Tissue Neck W Contrast  07/18/2014   CLINICAL DATA:  Feeling of fullness in the left lateral neck, 1 week duration. Unable to swallow. History of laryngectomy for cancer in 1988 with radiation.  EXAM: CT NECK WITH CONTRAST  TECHNIQUE: Multidetector CT imaging of the neck was performed using the standard protocol following the bolus administration of intravenous contrast.  CONTRAST:  34mL OMNIPAQUE IOHEXOL 300 MG/ML  SOLN  COMPARISON:  02/10/2014  FINDINGS: Visualized intracranial contents are  unremarkable. There is mild mucosal thickening affecting the left maxillary sinus an along the floor of the right maxillary sinus, but there is no layering fluid. There are a few opacified ethmoid air cells. No fluid in the middle ears or mastoids.  Both parotid glands are normal. The left submandibular gland is atrophic. The right submandibular gland has probably been resected.  There has been previous total laryngectomy and moderate divided right neck dissection. There are no enlarged lymph nodes or apparent developing masses. The esophageal lumen, which was previously seen to be completely air-filled, now shows constriction in the lower cervical region. This could be insignificant or could represent a true stricture. Contrast study or endoscopy would be necessary to determine that.  There is advanced atherosclerotic disease at both carotid bifurcations and common carotid arteries. The study was not done in the form of a CT angiogram, but there would be potential for flow-limiting stenosis. The left jugular vein is widely patent. The patient has developed bilateral pleural effusions, moderate in size, layering dependently. There is associated dependent pulmonary atelectasis. There is interstitial pulmonary edema.  The upper thoracic esophagus is full of fluid. There are numerous small mediastinal lymph nodes as seen previously.  IMPRESSION: No evidence of developing mass or lymphadenopathy. Question the possibility of a stricture in the lower cervical esophagus. This could be evaluated with swallowing study or endoscopy non emergently.  The upper thoracic esophagus is full of fluid, suggesting reflux.  Development of bilateral pleural effusions and interstitial pulmonary edema, findings not present on the previous study.   Electronically Signed   By: Nelson Chimes M.D.   On: 07/18/2014 01:43     EKG Interpretation   Date/Time:  Monday July 17 2014 23:21:57 EST Ventricular Rate:  63 PR Interval:   364 QRS Duration: 191 QT Interval:  487 QTC Calculation: 499 R Axis:   155 Text Interpretation:  Sinus rhythm Prolonged PR interval Consider left  ventricular hypertrophy Abnormal T, consider ischemia, lateral leads  Confirmed by Jaquisha Frech  MD, Jonavin Seder ([id number]) on 07/18/2014 1:52:28 AM      MDM   Final diagnoses:  Neck pain  Chest pain    No obvious respiratory or upper airway obstruction.CT scan shows no masses or lymphadenopathy. There may be some stricturing in the lower cervical esophagus.  Patient has follow-up with Dr. Willey Blade. He is awaiting a ride home. Discussed with Dr. Venora Maples  I personally performed the services described in this documentation, which was scribed in my presence. The recorded information has been reviewed and is accurate.    Nat Christen, MD 07/18/14 [phone]

## 2014-07-17 NOTE — ED Notes (Signed)
C/o shortness of breath all day with cough

## 2014-07-18 ENCOUNTER — Emergency Department (HOSPITAL_COMMUNITY): Payer: Non-veteran care

## 2014-07-18 LAB — CBC WITH DIFFERENTIAL/PLATELET
Basophils Absolute: 0 10*3/uL (ref 0.0–0.1)
Basophils Relative: 1 % (ref 0–1)
EOS ABS: 0.4 10*3/uL (ref 0.0–0.7)
Eosinophils Relative: 7 % — ABNORMAL HIGH (ref 0–5)
HEMATOCRIT: 28.2 % — AB (ref 39.0–52.0)
HEMOGLOBIN: 9.8 g/dL — AB (ref 13.0–17.0)
Lymphocytes Relative: 22 % (ref 12–46)
Lymphs Abs: 1.2 10*3/uL (ref 0.7–4.0)
MCH: 33.3 pg (ref 26.0–34.0)
MCHC: 34.8 g/dL (ref 30.0–36.0)
MCV: 95.9 fL (ref 78.0–100.0)
MONO ABS: 0.6 10*3/uL (ref 0.1–1.0)
MONOS PCT: 11 % (ref 3–12)
Neutro Abs: 3.3 10*3/uL (ref 1.7–7.7)
Neutrophils Relative %: 59 % (ref 43–77)
Platelets: 214 10*3/uL (ref 150–400)
RBC: 2.94 MIL/uL — AB (ref 4.22–5.81)
RDW: 12.6 % (ref 11.5–15.5)
WBC: 5.5 10*3/uL (ref 4.0–10.5)

## 2014-07-18 LAB — TROPONIN I: Troponin I: <0.3 ng/mL (ref <0.30)

## 2014-07-18 LAB — BASIC METABOLIC PANEL
Anion gap: 9 (ref 5–15)
BUN: 16 mg/dL (ref 6–23)
CHLORIDE: 93 meq/L — AB (ref 96–112)
CO2: 26 meq/L (ref 19–32)
CREATININE: 0.75 mg/dL (ref 0.50–1.35)
Calcium: 8.7 mg/dL (ref 8.4–10.5)
GFR calc Af Amer: >90 mL/min (ref >90)
GFR calc non Af Amer: 79 mL/min — ABNORMAL LOW (ref >90)
GLUCOSE: 95 mg/dL (ref 70–99)
POTASSIUM: 4.5 meq/L (ref 3.7–5.3)
Sodium: 128 mEq/L — ABNORMAL LOW (ref 137–147)

## 2014-07-18 LAB — POC OCCULT BLOOD, ED: Fecal Occult Bld: NEGATIVE

## 2014-07-18 LAB — PRO B NATRIURETIC PEPTIDE: Pro B Natriuretic peptide (BNP): 1363 pg/mL — ABNORMAL HIGH (ref 0–450)

## 2014-07-18 MED ORDER — IOHEXOL 300 MG/ML  SOLN
75.0000 mL | Freq: Once | INTRAMUSCULAR | Status: AC | PRN
  Administered 2014-07-18: 75 mL via INTRAVENOUS

## 2014-07-18 MED ORDER — FUROSEMIDE 10 MG/ML IJ SOLN
40.0000 mg | Freq: Once | INTRAMUSCULAR | Status: AC
  Administered 2014-07-18: 40 mg via INTRAVENOUS
  Filled 2014-07-18: qty 4

## 2014-07-18 NOTE — Discharge Instructions (Signed)
Tests showed the possibility of some narrowing in your esophagus.    Follow-up with Dr. Willey Blade

## 2014-07-25 ENCOUNTER — Encounter (HOSPITAL_COMMUNITY): Payer: Self-pay | Admitting: Emergency Medicine

## 2014-07-25 ENCOUNTER — Emergency Department (HOSPITAL_COMMUNITY)
Admission: EM | Admit: 2014-07-25 | Discharge: 2014-07-25 | Disposition: A | Discharge status: Home / Self Care | Payer: Medicare HMO | Attending: Emergency Medicine | Admitting: Emergency Medicine

## 2014-07-25 DIAGNOSIS — Z7982 Long term (current) use of aspirin: Secondary | ICD-10-CM | POA: Diagnosis not present

## 2014-07-25 DIAGNOSIS — Q791 Other congenital malformations of diaphragm: Secondary | ICD-10-CM | POA: Insufficient documentation

## 2014-07-25 DIAGNOSIS — M1990 Unspecified osteoarthritis, unspecified site: Secondary | ICD-10-CM | POA: Insufficient documentation

## 2014-07-25 DIAGNOSIS — Z79899 Other long term (current) drug therapy: Secondary | ICD-10-CM | POA: Diagnosis not present

## 2014-07-25 DIAGNOSIS — J029 Acute pharyngitis, unspecified: Secondary | ICD-10-CM | POA: Diagnosis not present

## 2014-07-25 DIAGNOSIS — R1310 Dysphagia, unspecified: Secondary | ICD-10-CM

## 2014-07-25 DIAGNOSIS — Z85828 Personal history of other malignant neoplasm of skin: Secondary | ICD-10-CM | POA: Insufficient documentation

## 2014-07-25 DIAGNOSIS — E039 Hypothyroidism, unspecified: Secondary | ICD-10-CM | POA: Diagnosis not present

## 2014-07-25 DIAGNOSIS — Z9002 Acquired absence of larynx: Secondary | ICD-10-CM | POA: Diagnosis not present

## 2014-07-25 DIAGNOSIS — K219 Gastro-esophageal reflux disease without esophagitis: Secondary | ICD-10-CM | POA: Insufficient documentation

## 2014-07-25 DIAGNOSIS — Z8521 Personal history of malignant neoplasm of larynx: Secondary | ICD-10-CM | POA: Insufficient documentation

## 2014-07-25 DIAGNOSIS — Z87891 Personal history of nicotine dependence: Secondary | ICD-10-CM | POA: Diagnosis not present

## 2014-07-25 DIAGNOSIS — Z862 Personal history of diseases of the blood and blood-forming organs and certain disorders involving the immune mechanism: Secondary | ICD-10-CM | POA: Diagnosis not present

## 2014-07-25 DIAGNOSIS — I2510 Atherosclerotic heart disease of native coronary artery without angina pectoris: Secondary | ICD-10-CM | POA: Diagnosis not present

## 2014-07-25 NOTE — ED Provider Notes (Signed)
CSN: [id number]     Arrival date & time 07/25/14  1812 History  This chart was scribed for Dorie Rank, MD by Lowella Petties, ED Scribe. The patient was seen in room APA06/APA06. [name] was started at 8:44 PM.   Chief Complaint  Patient presents with  . Dysphagia   The history is provided by the patient. No language interpreter was used.   HPI Comments: Andre Holder is a 80 y.o. male with a history of laryngectomy who presents to the Emergency Department complaining of SOB earlier today. He states that it felt like something was stuck in his throat. He reports associated cough, swollen lymph nodes, and sore throat. He states that he was seen here 1 week ago for similar symptoms. His wife states that he has an appointment with his ENT next Monday. He states that he feels ok currently.   Past Medical History  Diagnosis Date  . Arteriosclerotic cardiovascular disease (ASCVD)     Nonobstructive; 09/2008 50% proximal and 40% mid LAD; 25% circumflex; 30% RCA; mild global LV dysfunction with EF of 45%. No aortic stenosis.  . Mild aortic stenosis     not documented at catheterization; verified by echo in 2011  . Peripheral vascular disease     With a 70% innominate artery stenosis and nonobstructive carotid stenosis  . Hypothyroidism   . Degenerative joint disease     s/p bilateral TKR  . Mobitz (type) II atrioventricular block     With bradycardia; Medtronic pacemaker implanted in 09/2008  . Tobacco abuse, in remission     Remote  . GERD (gastroesophageal reflux disease)   . Hyperlipidemia     Lipid profile in 04/2010:115, 98, 43, 52.  . Cancer of larynx     laryngectomy in 1988; postoperative radiation therapy  . Weight loss     50 pounds between 1991 and 2011  . 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 Adrenal hyperplasia     Stable on serial imaging  . Anemia     minimal in 2011 with hemoglobin of 12.2 and high normal MCV  .  Borderline hypertension     Normal CMet in 2011  . Skin cancer    Past Surgical History  Procedure Laterality Date  . Laryngectomy  1988    S/P laryngectomy and radiation therapy  . Appendectomy  1973  . Knee arthroscopy      Left  . Total knee arthroplasty      Bilateral, 19 years ago  . Cataract extraction, bilateral    . Decompression facial nerve      Right median  . Pacemaker insertion    . Insert / replace / remove pacemaker     Family History  Problem Relation Age of Onset  . Stroke Mother   . Leukemia Father   . Colon cancer Neg Hx   . Liver disease Neg Hx   . GI problems Neg Hx   . Stroke Other   . Diabetes Other    History  Substance Use Topics  . Smoking status: Former Smoker    Types: Cigarettes  . Smokeless tobacco: Former Systems developer    Quit date: 09/08/1982     Comment: Quit 30 years  . Alcohol Use: No    Review of Systems  HENT: Positive for sore throat.   Respiratory: Positive for cough and shortness of breath.    A complete 10 system review  of systems was obtained and all systems are negative except as noted in the HPI and PMH.   Allergies  Review of patient's allergies indicates no known allergies.  Home Medications   Prior to Admission medications   Medication Sig Start Date End Date Taking? Authorizing Provider  aspirin EC 81 MG tablet Take 81 mg by mouth daily.    Historical Provider, MD  B Complex-C (B-COMPLEX WITH VITAMIN C) tablet Take 1 tablet by mouth daily.    Historical Provider, MD  cholecalciferol (VITAMIN D) 1000 UNITS tablet Take 1,000 Units by mouth daily.    Historical Provider, MD  docusate sodium (COLACE) 100 MG capsule Take 100 mg by mouth 2 (two) times daily.    Historical Provider, MD  finasteride (PROSCAR) 5 MG tablet Take 5 mg by mouth at bedtime.    Historical Provider, MD  HYDROcodone-acetaminophen (NORCO/VICODIN) 5-325 MG per tablet Take 1 tablet by mouth every 8 (eight) hours as needed for moderate pain or severe pain.   07/15/13   Historical Provider, MD  levothyroxine (SYNTHROID, LEVOTHROID) 175 MCG tablet Take 175 mcg by mouth daily.    Historical Provider, MD  magnesium oxide (MAG-OX) 400 MG tablet Take 400 mg by mouth 2 (two) times daily.    Historical Provider, MD  methocarbamol (ROBAXIN) 750 MG tablet Take 750 mg by mouth 2 (two) times daily.    Historical Provider, MD  Multiple Vitamin (MULTIVITAMIN WITH MINERALS) TABS Take 1 tablet by mouth daily.    Historical Provider, MD  omeprazole (PRILOSEC) 20 MG capsule Take 20 mg by mouth daily.    Historical Provider, MD  Tamsulosin HCl (FLOMAX) 0.4 MG CAPS Take 0.4 mg by mouth 2 (two) times daily.     Historical Provider, MD  vitamin C (ASCORBIC ACID) 500 MG tablet Take 500 mg by mouth daily.    Historical Provider, MD   BP 120/58 mmHg  Pulse 53  Temp(Src) 98.8 F (37.1 C) (Oral)  Resp 18  Ht 6' 2.5" (1.892 m)  Wt 165 lb (74.844 kg)  BMI 20.91 kg/m2  SpO2 97% Physical Exam  Constitutional: He appears well-developed and well-nourished. No distress.  HENT:  Head: Normocephalic and atraumatic.  Right Ear: External ear normal.  Left Ear: External ear normal.  Mouth/Throat: Oropharynx is clear and moist. No oropharyngeal exudate.  Eyes: Conjunctivae are normal. Right eye exhibits no discharge. Left eye exhibits no discharge. No scleral icterus.  Neck: Neck supple. No tracheal deviation present.  Tracheostomy, no drainage.   Cardiovascular: Normal rate, regular rhythm and intact distal pulses.   Pulmonary/Chest: Effort normal and breath sounds normal. No stridor. No respiratory distress. He has no wheezes. He has no rales.  Abdominal: Soft. Bowel sounds are normal. He exhibits no distension. There is no tenderness. There is no rebound and no guarding.  Musculoskeletal: He exhibits no edema or tenderness.  Neurological: He is alert. He has normal strength. No cranial nerve deficit (no facial droop, extraocular movements intact, no slurred speech) or sensory  deficit. He exhibits normal muscle tone. He displays no seizure activity. Coordination normal.  Skin: Skin is warm and dry. No rash noted.  Psychiatric: He has a normal mood and affect.  Nursing note and vitals reviewed.   ED Course  Procedures (including critical care time) DIAGNOSTIC STUDIES: Oxygen Saturation is 97% on room air, normal by my interpretation.    COORDINATION OF CARE: 8:56 PM-Discussed treatment plan with pt at bedside and pt agreed to plan.    MDM  Final diagnoses:  Dysphagia   I have reviewed the imaging studies that were performed on the patient was seen in the emergency room on November 9. The patient had a CT scan of the neck as well as a chest x-ray.the chest x-ray did not show any evidence of pneumonia. CT scan of the neck did not show any evidence of a neck mass.  There was no lymphadenopathy.  There was the suggestion of a possible stricture in the lower esophagus.patient was told this at the time of his previous visit.  I'm not entirely sure why the patient was sent to the emergency room. He does not want to remain in the ED any longer and wants to go home. He does not want any testing. The patient denies feeling short of breath.   At this time there does not appear to be any evidence of an acute emergency medical condition and the patient appears stable for discharge with appropriate outpatient follow up.   I personally performed the services described in this documentation, which was scribed in my presence.  The recorded information has been reviewed and is accurate.    Dorie Rank, MD 07/25/14 2108

## 2014-07-25 NOTE — ED Notes (Signed)
PT also reports a sore throat/dry cough and has a trach.

## 2014-07-25 NOTE — ED Notes (Signed)
MD at bedside. 

## 2014-07-25 NOTE — ED Notes (Signed)
Patient was in waiting room eating chips and drinking.

## 2014-07-25 NOTE — ED Notes (Signed)
Pt c/o "choking" feeling while swallowing. Able to communicate via voicebox clearly. No SOB or respiratory distress noted.

## 2014-07-25 NOTE — Discharge Instructions (Signed)

## 2014-07-25 NOTE — ED Notes (Signed)
Pt left ED via wheelchair by private vehicle to home. No signs of distress. Pt and family verbalizes discharge instructions.

## 2014-07-25 NOTE — ED Notes (Signed)
PT reports difficulty swallowing today and [facility] come to [facility] for [facility]. PT denies any SOB but feels choked when swallowing.

## 2014-07-25 NOTE — ED Notes (Signed)
Eating chips in lobby.  Told pt that he was not supposed to eat or drink prior to seeing MD

## 2014-08-31 ENCOUNTER — Encounter (HOSPITAL_COMMUNITY): Payer: Self-pay | Admitting: Emergency Medicine

## 2014-08-31 ENCOUNTER — Inpatient Hospital Stay (HOSPITAL_COMMUNITY)
Admission: EM | Admit: 2014-08-31 | Discharge: 2014-09-02 | DRG: 292 | Disposition: A | Discharge status: Home / Self Care | Payer: Medicare HMO | Attending: Internal Medicine | Admitting: Internal Medicine

## 2014-08-31 ENCOUNTER — Emergency Department (HOSPITAL_COMMUNITY): Payer: Medicare HMO

## 2014-08-31 DIAGNOSIS — Z823 Family history of stroke: Secondary | ICD-10-CM

## 2014-08-31 DIAGNOSIS — Z806 Family history of leukemia: Secondary | ICD-10-CM

## 2014-08-31 DIAGNOSIS — Z923 Personal history of irradiation: Secondary | ICD-10-CM | POA: Diagnosis not present

## 2014-08-31 DIAGNOSIS — M1990 Unspecified osteoarthritis, unspecified site: Secondary | ICD-10-CM | POA: Diagnosis present

## 2014-08-31 DIAGNOSIS — Z8521 Personal history of malignant neoplasm of larynx: Secondary | ICD-10-CM | POA: Diagnosis not present

## 2014-08-31 DIAGNOSIS — I2510 Atherosclerotic heart disease of native coronary artery without angina pectoris: Secondary | ICD-10-CM | POA: Diagnosis present

## 2014-08-31 DIAGNOSIS — E785 Hyperlipidemia, unspecified: Secondary | ICD-10-CM | POA: Diagnosis present

## 2014-08-31 DIAGNOSIS — Z833 Family history of diabetes mellitus: Secondary | ICD-10-CM

## 2014-08-31 DIAGNOSIS — R0789 Other chest pain: Secondary | ICD-10-CM | POA: Diagnosis present

## 2014-08-31 DIAGNOSIS — Z96653 Presence of artificial knee joint, bilateral: Secondary | ICD-10-CM | POA: Diagnosis present

## 2014-08-31 DIAGNOSIS — Z85828 Personal history of other malignant neoplasm of skin: Secondary | ICD-10-CM

## 2014-08-31 DIAGNOSIS — Z79891 Long term (current) use of opiate analgesic: Secondary | ICD-10-CM

## 2014-08-31 DIAGNOSIS — I739 Peripheral vascular disease, unspecified: Secondary | ICD-10-CM | POA: Diagnosis present

## 2014-08-31 DIAGNOSIS — Z79899 Other long term (current) drug therapy: Secondary | ICD-10-CM

## 2014-08-31 DIAGNOSIS — R079 Chest pain, unspecified: Secondary | ICD-10-CM | POA: Diagnosis not present

## 2014-08-31 DIAGNOSIS — I509 Heart failure, unspecified: Secondary | ICD-10-CM

## 2014-08-31 DIAGNOSIS — D649 Anemia, unspecified: Secondary | ICD-10-CM

## 2014-08-31 DIAGNOSIS — I5023 Acute on chronic systolic (congestive) heart failure: Principal | ICD-10-CM | POA: Diagnosis present

## 2014-08-31 DIAGNOSIS — K219 Gastro-esophageal reflux disease without esophagitis: Secondary | ICD-10-CM | POA: Diagnosis present

## 2014-08-31 DIAGNOSIS — E871 Hypo-osmolality and hyponatremia: Secondary | ICD-10-CM | POA: Diagnosis present

## 2014-08-31 DIAGNOSIS — Z7982 Long term (current) use of aspirin: Secondary | ICD-10-CM | POA: Diagnosis not present

## 2014-08-31 DIAGNOSIS — E039 Hypothyroidism, unspecified: Secondary | ICD-10-CM | POA: Diagnosis present

## 2014-08-31 DIAGNOSIS — Z87891 Personal history of nicotine dependence: Secondary | ICD-10-CM | POA: Diagnosis not present

## 2014-08-31 DIAGNOSIS — R0600 Dyspnea, unspecified: Secondary | ICD-10-CM | POA: Diagnosis present

## 2014-08-31 DIAGNOSIS — Z66 Do not resuscitate: Secondary | ICD-10-CM | POA: Diagnosis present

## 2014-08-31 DIAGNOSIS — Z950 Presence of cardiac pacemaker: Secondary | ICD-10-CM | POA: Diagnosis not present

## 2014-08-31 DIAGNOSIS — I059 Rheumatic mitral valve disease, unspecified: Secondary | ICD-10-CM

## 2014-08-31 LAB — BASIC METABOLIC PANEL
Anion gap: 6 (ref 5–15)
BUN: 19 mg/dL (ref 6–23)
CO2: 27 mmol/L (ref 19–32)
Calcium: 8.8 mg/dL (ref 8.4–10.5)
Chloride: 98 mEq/L (ref 96–112)
Creatinine, Ser: 0.7 mg/dL (ref 0.50–1.35)
GFR calc Af Amer: >90 mL/min (ref >90)
GFR, EST NON AFRICAN AMERICAN: 81 mL/min — AB (ref >90)
GLUCOSE: 96 mg/dL (ref 70–99)
POTASSIUM: 4.2 mmol/L (ref 3.5–5.1)
Sodium: 131 mmol/L — ABNORMAL LOW (ref 135–145)

## 2014-08-31 LAB — CBC WITH DIFFERENTIAL/PLATELET
Basophils Absolute: 0 10*3/uL (ref 0.0–0.1)
Basophils Relative: 1 % (ref 0–1)
EOS PCT: 9 % — AB (ref 0–5)
Eosinophils Absolute: 0.5 10*3/uL (ref 0.0–0.7)
HEMATOCRIT: 29.9 % — AB (ref 39.0–52.0)
HEMOGLOBIN: 10.3 g/dL — AB (ref 13.0–17.0)
LYMPHS PCT: 19 % (ref 12–46)
Lymphs Abs: 1 10*3/uL (ref 0.7–4.0)
MCH: 33.8 pg (ref 26.0–34.0)
MCHC: 34.4 g/dL (ref 30.0–36.0)
MCV: 98 fL (ref 78.0–100.0)
MONO ABS: 0.5 10*3/uL (ref 0.1–1.0)
MONOS PCT: 9 % (ref 3–12)
NEUTROS ABS: 3.2 10*3/uL (ref 1.7–7.7)
Neutrophils Relative %: 62 % (ref 43–77)
Platelets: 207 10*3/uL (ref 150–400)
RBC: 3.05 MIL/uL — ABNORMAL LOW (ref 4.22–5.81)
RDW: 12.9 % (ref 11.5–15.5)
WBC: 5.2 10*3/uL (ref 4.0–10.5)

## 2014-08-31 LAB — CBC
HCT: 31.9 % — ABNORMAL LOW (ref 39.0–52.0)
Hemoglobin: 10.8 g/dL — ABNORMAL LOW (ref 13.0–17.0)
MCH: 33.1 pg (ref 26.0–34.0)
MCHC: 33.9 g/dL (ref 30.0–36.0)
MCV: 97.9 fL (ref 78.0–100.0)
PLATELETS: 228 10*3/uL (ref 150–400)
RBC: 3.26 MIL/uL — AB (ref 4.22–5.81)
RDW: 12.8 % (ref 11.5–15.5)
WBC: 5.6 10*3/uL (ref 4.0–10.5)

## 2014-08-31 LAB — CREATININE, SERUM
CREATININE: 0.81 mg/dL (ref 0.50–1.35)
GFR, EST AFRICAN AMERICAN: 89 mL/min — AB (ref >90)
GFR, EST NON AFRICAN AMERICAN: 77 mL/min — AB (ref >90)

## 2014-08-31 LAB — TSH: TSH: 7.674 u[IU]/mL — AB (ref 0.350–4.500)

## 2014-08-31 LAB — TROPONIN I: Troponin I: <0.03 ng/mL (ref <0.031)

## 2014-08-31 LAB — BRAIN NATRIURETIC PEPTIDE: B Natriuretic Peptide: 1103 pg/mL — ABNORMAL HIGH (ref 0.0–100.0)

## 2014-08-31 MED ORDER — MORPHINE SULFATE 2 MG/ML IJ SOLN
1.0000 mg | Freq: Once | INTRAMUSCULAR | Status: AC
  Administered 2014-08-31: 1 mg via INTRAVENOUS
  Filled 2014-08-31: qty 1

## 2014-08-31 MED ORDER — HYDROCODONE-ACETAMINOPHEN 5-325 MG PO TABS
1.0000 | ORAL_TABLET | Freq: Three times a day (TID) | ORAL | Status: DC | PRN
  Filled 2014-08-31: qty 1

## 2014-08-31 MED ORDER — SODIUM CHLORIDE 0.9 % IJ SOLN
3.0000 mL | INTRAMUSCULAR | Status: DC | PRN
  Administered 2014-08-31: 3 mL via INTRAVENOUS
  Filled 2014-08-31: qty 3

## 2014-08-31 MED ORDER — LEVOTHYROXINE SODIUM 75 MCG PO TABS
175.0000 ug | ORAL_TABLET | Freq: Every day | ORAL | Status: DC
  Administered 2014-08-31 – 2014-09-02 (×3): 175 ug via ORAL
  Filled 2014-08-31 (×6): qty 1

## 2014-08-31 MED ORDER — FUROSEMIDE 10 MG/ML IJ SOLN
40.0000 mg | Freq: Once | INTRAMUSCULAR | Status: AC
  Administered 2014-08-31: 40 mg via INTRAVENOUS
  Filled 2014-08-31: qty 4

## 2014-08-31 MED ORDER — NITROGLYCERIN 0.4 MG SL SUBL: 0.4000 mg | SUBLINGUAL_TABLET | SUBLINGUAL | Status: DC | PRN

## 2014-08-31 MED ORDER — TAMSULOSIN HCL 0.4 MG PO CAPS
0.4000 mg | ORAL_CAPSULE | Freq: Two times a day (BID) | ORAL | Status: DC
  Administered 2014-08-31 – 2014-09-02 (×5): 0.4 mg via ORAL
  Filled 2014-08-31 (×5): qty 1

## 2014-08-31 MED ORDER — ASPIRIN 81 MG PO CHEW
324.0000 mg | CHEWABLE_TABLET | Freq: Once | ORAL | Status: AC
  Administered 2014-08-31: 324 mg via ORAL
  Filled 2014-08-31: qty 4

## 2014-08-31 MED ORDER — MUSCLE RUB 10-15 % EX CREA
TOPICAL_CREAM | CUTANEOUS | Status: DC | PRN
  Filled 2014-08-31 (×2): qty 85

## 2014-08-31 MED ORDER — DOCUSATE SODIUM 100 MG PO CAPS
100.0000 mg | ORAL_CAPSULE | Freq: Two times a day (BID) | ORAL | Status: DC
  Administered 2014-08-31 – 2014-09-02 (×5): 100 mg via ORAL
  Filled 2014-08-31 (×5): qty 1

## 2014-08-31 MED ORDER — FINASTERIDE 5 MG PO TABS
5.0000 mg | ORAL_TABLET | Freq: Every day | ORAL | Status: DC
  Administered 2014-08-31 – 2014-09-01 (×2): 5 mg via ORAL
  Filled 2014-08-31 (×4): qty 1

## 2014-08-31 MED ORDER — INFLUENZA VAC SPLIT QUAD 0.5 ML IM SUSY
0.5000 mL | PREFILLED_SYRINGE | INTRAMUSCULAR | Status: AC
  Administered 2014-09-01: 0.5 mL via INTRAMUSCULAR
  Filled 2014-08-31: qty 0.5

## 2014-08-31 MED ORDER — LISINOPRIL 5 MG PO TABS
2.5000 mg | ORAL_TABLET | Freq: Every day | ORAL | Status: DC
  Administered 2014-08-31 – 2014-09-01 (×2): 2.5 mg via ORAL
  Filled 2014-08-31 (×2): qty 1

## 2014-08-31 MED ORDER — METHOCARBAMOL 500 MG PO TABS
750.0000 mg | ORAL_TABLET | Freq: Two times a day (BID) | ORAL | Status: DC
  Administered 2014-08-31 – 2014-09-02 (×5): 750 mg via ORAL
  Filled 2014-08-31 (×5): qty 2

## 2014-08-31 MED ORDER — METOPROLOL TARTRATE 25 MG PO TABS
12.5000 mg | ORAL_TABLET | Freq: Two times a day (BID) | ORAL | Status: DC
  Administered 2014-08-31 – 2014-09-01 (×4): 12.5 mg via ORAL
  Filled 2014-08-31 (×4): qty 1

## 2014-08-31 MED ORDER — FUROSEMIDE 10 MG/ML IJ SOLN
40.0000 mg | Freq: Two times a day (BID) | INTRAMUSCULAR | Status: DC
  Administered 2014-08-31 – 2014-09-02 (×4): 40 mg via INTRAVENOUS
  Filled 2014-08-31 (×4): qty 4

## 2014-08-31 MED ORDER — MAGNESIUM OXIDE 400 (241.3 MG) MG PO TABS
400.0000 mg | ORAL_TABLET | Freq: Two times a day (BID) | ORAL | Status: DC
  Administered 2014-08-31 – 2014-09-02 (×5): 400 mg via ORAL
  Filled 2014-08-31 (×5): qty 1

## 2014-08-31 MED ORDER — SODIUM CHLORIDE 0.9 % IV SOLN: 250.0000 mL | INTRAVENOUS | Status: DC | PRN

## 2014-08-31 MED ORDER — ENOXAPARIN SODIUM 40 MG/0.4ML ~~LOC~~ SOLN
40.0000 mg | Freq: Every day | SUBCUTANEOUS | Status: DC
  Administered 2014-08-31 – 2014-09-02 (×3): 40 mg via SUBCUTANEOUS
  Filled 2014-08-31 (×3): qty 0.4

## 2014-08-31 MED ORDER — PANTOPRAZOLE SODIUM 40 MG PO TBEC
40.0000 mg | DELAYED_RELEASE_TABLET | Freq: Every day | ORAL | Status: DC
  Administered 2014-08-31 – 2014-09-02 (×3): 40 mg via ORAL
  Filled 2014-08-31 (×3): qty 1

## 2014-08-31 MED ORDER — ACETAMINOPHEN 325 MG PO TABS: 650.0000 mg | ORAL_TABLET | ORAL | Status: DC | PRN

## 2014-08-31 MED ORDER — ONDANSETRON HCL 4 MG/2ML IJ SOLN: 4.0000 mg | Freq: Four times a day (QID) | INTRAMUSCULAR | Status: DC | PRN

## 2014-08-31 MED ORDER — ASPIRIN EC 81 MG PO TBEC
81.0000 mg | DELAYED_RELEASE_TABLET | Freq: Every day | ORAL | Status: DC
  Administered 2014-08-31 – 2014-09-02 (×3): 81 mg via ORAL
  Filled 2014-08-31 (×3): qty 1

## 2014-08-31 MED ORDER — SODIUM CHLORIDE 0.9 % IJ SOLN
3.0000 mL | Freq: Two times a day (BID) | INTRAMUSCULAR | Status: DC
  Administered 2014-08-31 – 2014-09-02 (×4): 3 mL via INTRAVENOUS

## 2014-08-31 MED ORDER — VITAMIN D 1000 UNITS PO TABS
1000.0000 [IU] | ORAL_TABLET | Freq: Every day | ORAL | Status: DC
  Administered 2014-08-31 – 2014-09-02 (×3): 1000 [IU] via ORAL
  Filled 2014-08-31 (×3): qty 1

## 2014-08-31 NOTE — ED Provider Notes (Signed)
CSN: [id number]     Arrival date & time 08/31/14  0539 History   First MD Initiated Contact with Patient 08/31/14 0555     Chief Complaint  Patient presents with  . Chest Pain     (Consider location/radiation/quality/duration/timing/severity/associated sxs/prior Treatment) Patient is a 80 y.o. male presenting with chest pain. The history is provided by the patient.  Chest Pain He has been complaining of waxing and waning left-sided chest pain since last night. He has difficulty characterizing the pain. Pain is moderate in intensity but he cannot put a number to it. There is associated dyspnea and nausea but no diaphoresis. Nothing makes pain better nothing makes worse. He is not attempted any treatments at home. He is status post laryngectomy for cancer in the remote past.  Past Medical History  Diagnosis Date  . Arteriosclerotic cardiovascular disease (ASCVD)     Nonobstructive; 09/2008 50% proximal and 40% mid LAD; 25% circumflex; 30% RCA; mild global LV dysfunction with EF of 45%. No aortic stenosis.  . Mild aortic stenosis     not documented at catheterization; verified by echo in 2011  . Peripheral vascular disease     With a 70% innominate artery stenosis and nonobstructive carotid stenosis  . Hypothyroidism   . Degenerative joint disease     s/p bilateral TKR  . Mobitz (type) II atrioventricular block     With bradycardia; Medtronic pacemaker implanted in 09/2008  . Tobacco abuse, in remission     Remote  . GERD (gastroesophageal reflux disease)   . Hyperlipidemia     Lipid profile in 04/2010:115, 98, 43, 52.  . Cancer of larynx     laryngectomy in 1988; postoperative radiation therapy  . Weight loss     50 pounds between 1991 and 2011  . 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 Adrenal hyperplasia     Stable on serial imaging  . Anemia     minimal in 2011 with hemoglobin of 12.2 and high normal MCV  . Borderline hypertension      Normal CMet in 2011  . Skin cancer    Past Surgical History  Procedure Laterality Date  . Laryngectomy  1988    S/P laryngectomy and radiation therapy  . Appendectomy  1973  . Knee arthroscopy      Left  . Total knee arthroplasty      Bilateral, 19 years ago  . Cataract extraction, bilateral    . Decompression facial nerve      Right median  . Pacemaker insertion    . Insert / replace / remove pacemaker     Family History  Problem Relation Age of Onset  . Stroke Mother   . Leukemia Father   . Colon cancer Neg Hx   . Liver disease Neg Hx   . GI problems Neg Hx   . Stroke Other   . Diabetes Other    History  Substance Use Topics  . Smoking status: Former Smoker    Types: Cigarettes  . Smokeless tobacco: Former Systems developer    Quit date: 09/08/1982     Comment: Quit 30 years  . Alcohol Use: No    Review of Systems  Cardiovascular: Positive for chest pain.  All other systems reviewed and are negative.     Allergies  Review of patient's allergies indicates no known allergies.  Home Medications   Prior to Admission medications   Medication Sig Start  Date End Date Taking? Authorizing Provider  aspirin EC 81 MG tablet Take 81 mg by mouth daily.    Historical Provider, MD  B Complex-C (B-COMPLEX WITH VITAMIN C) tablet Take 1 tablet by mouth daily.    Historical Provider, MD  cholecalciferol (VITAMIN D) 1000 UNITS tablet Take 1,000 Units by mouth daily.    Historical Provider, MD  docusate sodium (COLACE) 100 MG capsule Take 100 mg by mouth 2 (two) times daily.    Historical Provider, MD  finasteride (PROSCAR) 5 MG tablet Take 5 mg by mouth at bedtime.    Historical Provider, MD  HYDROcodone-acetaminophen (NORCO/VICODIN) 5-325 MG per tablet Take 1 tablet by mouth every 8 (eight) hours as needed for moderate pain or severe pain.  07/15/13   Historical Provider, MD  levothyroxine (SYNTHROID, LEVOTHROID) 175 MCG tablet Take 175 mcg by mouth daily.    Historical Provider, MD   magnesium oxide (MAG-OX) 400 MG tablet Take 400 mg by mouth 2 (two) times daily.    Historical Provider, MD  methocarbamol (ROBAXIN) 750 MG tablet Take 750 mg by mouth 2 (two) times daily.    Historical Provider, MD  Multiple Vitamin (MULTIVITAMIN WITH MINERALS) TABS Take 1 tablet by mouth daily.    Historical Provider, MD  omeprazole (PRILOSEC) 20 MG capsule Take 20 mg by mouth daily.    Historical Provider, MD  Tamsulosin HCl (FLOMAX) 0.4 MG CAPS Take 0.4 mg by mouth 2 (two) times daily.     Historical Provider, MD  vitamin C (ASCORBIC ACID) 500 MG tablet Take 500 mg by mouth daily.    Historical Provider, MD   BP 129/65 mmHg  Pulse 60  Temp(Src) 97.7 F (36.5 C) (Oral)  Resp 13 Physical Exam  Nursing note and vitals reviewed.  80 year old male, resting comfortably and in no acute distress. Vital signs are normal. Oxygen saturation is 94%, which is normal. Head is normocephalic and atraumatic. PERRLA, EOMI. Oropharynx is clear. Neck is nontender and supple without adenopathy or JVD. Tracheostomy present. Back is nontender and there is no CVA tenderness. Lungs are clear without rales, wheezes, or rhonchi. Chest is nontender. Pacemaker present in left subclavian area. Heart has regular rate and rhythm with 1/6 holosystolic murmur. Abdomen is soft, flat, nontender without masses or hepatosplenomegaly and peristalsis is normoactive. Extremities have trace edema, full range of motion is present. Skin is warm and dry without rash. Neurologic: Mental status is normal, cranial nerves are intact, there are no motor or sensory deficits.  ED Course  Procedures (including critical care time) Labs Review Results for orders placed or performed during the hospital encounter of 17/49/44  Basic metabolic panel  Result Value Ref Range   Sodium 131 (L) 135 - 145 mmol/L   Potassium 4.2 3.5 - 5.1 mmol/L   Chloride 98 96 - 112 mEq/L   CO2 27 19 - 32 mmol/L   Glucose, Bld 96 70 - 99 mg/dL   BUN 19  6 - 23 mg/dL   Creatinine, Ser 0.70 0.50 - 1.35 mg/dL   Calcium 8.8 8.4 - 10.5 mg/dL   GFR calc non Af Amer 81 (L) >90 mL/min   GFR calc Af Amer >90 >90 mL/min   Anion gap 6 5 - 15  Troponin I  Result Value Ref Range   Troponin I <0.03 <0.031 ng/mL  CBC with Differential  Result Value Ref Range   WBC 5.2 4.0 - 10.5 K/uL   RBC 3.05 (L) 4.22 - 5.81 MIL/uL  Hemoglobin 10.3 (L) 13.0 - 17.0 g/dL   HCT 29.9 (L) 39.0 - 52.0 %   MCV 98.0 78.0 - 100.0 fL   MCH 33.8 26.0 - 34.0 pg   MCHC 34.4 30.0 - 36.0 g/dL   RDW 12.9 11.5 - 15.5 %   Platelets 207 150 - 400 K/uL   Neutrophils Relative % 62 43 - 77 %   Neutro Abs 3.2 1.7 - 7.7 K/uL   Lymphocytes Relative 19 12 - 46 %   Lymphs Abs 1.0 0.7 - 4.0 K/uL   Monocytes Relative 9 3 - 12 %   Monocytes Absolute 0.5 0.1 - 1.0 K/uL   Eosinophils Relative 9 (H) 0 - 5 %   Eosinophils Absolute 0.5 0.0 - 0.7 K/uL   Basophils Relative 1 0 - 1 %   Basophils Absolute 0.0 0.0 - 0.1 K/uL  Brain natriuretic peptide  Result Value Ref Range   B Natriuretic Peptide 1103.0 (H) 0.0 - 100.0 pg/mL   Imaging Review Dg Chest Port 1 View  08/31/2014   CLINICAL DATA:  Acute onset of generalized chest pain. Initial encounter.  EXAM: PORTABLE CHEST - 1 VIEW  COMPARISON:  Chest radiograph performed 07/18/2014  FINDINGS: The lungs are well-aerated. There is elevation of the left hemidiaphragm. Increased interstitial markings are more prominent than on prior studies, concerning for mild interstitial edema superimposed on the patient's known chronic interstitial lung changes. Pneumonia could have a similar appearance. No definite pleural effusion or pneumothorax is seen.  The cardiomediastinal silhouette is enlarged. A pacemaker is noted overlying the left chest wall, with leads ending overlying the right atrium and right ventricle. No acute osseous abnormalities are seen.  IMPRESSION: Increased prominence of interstitial markings. This raises concern for mild interstitial  edema, superimposed on known chronic lung changes. Pneumonia could have a similar appearance. Chronic elevation of the left hemidiaphragm. Cardiomegaly noted.   Electronically Signed   By: Garald Balding M.D.   On: 08/31/2014 06:52   Images viewed by me.   EKG Interpretation   Date/Time:  Thursday August 31 2014 05:52:35 EST Ventricular Rate:  65 PR Interval:  362 QRS Duration: 193 QT Interval:  449 QTC Calculation: 467 R Axis:   -67 Text Interpretation:  Sinus rhythm Prolonged PR interval ATRIAL SENSING  Electronic ventricular pacemaker When compared with ECG of 07/16/2014, No  significant change was found Confirmed by [facility]  MD, Pasco Marchitto ([id number]) on  08/31/2014 6:00:34 AM      MDM   Final diagnoses:  Chest pain, unspecified chest pain type  CHF exacerbation  Hyponatremia  Normochromic normocytic anemia    Chest pain of uncertain cause. Old records are reviewed and he has a dual-chamber pacemaker and is status post laryngectomy. Cardiac catheterization in 2012 showed his worst lesion was in the LAD with 40% stenosis. Current pain is unlikely to be ischemic coronary disease but cardiac workup was initiated. He'll begin aspirin and nitroglycerin.  Troponin has come back normal but BNP is moderately elevated and chest surgery has findings consistent with CHF and early pulmonary edema. He is given a dose of furosemide. He is currently pain-free. Case is discussed with Dr. Roderic Palau [facility] who agrees to admit the patient under observation status.  Delora Fuel, MD 03/54/65 [id number]

## 2014-08-31 NOTE — H&P (Signed)
[facility] History and Physical  Andre Holder YFV:[id number] DOB: 11-Feb-1923 DOA: 08/31/2014  Referring physician: Dr. Roxanne Mins, ER physician PCP: Asencion Noble, MD   Chief Complaint: chest pain  HPI: Andre Holder is a 80 y.o. male who presents to hospital with complaints of substernal chest pain that began last night. Patient's discomfort woke him up from sleep. He has associated shortness of breath, no nausea, no vomiting no diaphoresis. Since arriving to the emergency room, he feels that his chest pain has resolved and shortness of breath is also improved. In the emergency room, he was noted to have him evidence of volume overload and admitted for further evaluation.   Review of Systems:  Pertinent positives as per HPI, otherwise negative  Past Medical History  Diagnosis Date  . Arteriosclerotic cardiovascular disease (ASCVD)     Nonobstructive; 09/2008 50% proximal and 40% mid LAD; 25% circumflex; 30% RCA; mild global LV dysfunction with EF of 45%. No aortic stenosis.  . Mild aortic stenosis     not documented at catheterization; verified by echo in 2011  . Peripheral vascular disease     With a 70% innominate artery stenosis and nonobstructive carotid stenosis  . Hypothyroidism   . Degenerative joint disease     s/p bilateral TKR  . Mobitz (type) II atrioventricular block     With bradycardia; Medtronic pacemaker implanted in 09/2008  . Tobacco abuse, in remission     Remote  . GERD (gastroesophageal reflux disease)   . Hyperlipidemia     Lipid profile in 04/2010:115, 98, 43, 52.  . Cancer of larynx     laryngectomy in 1988; postoperative radiation therapy  . Weight loss     50 pounds between 1991 and 2011  . 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 Adrenal hyperplasia     Stable on serial imaging  . Anemia     minimal in 2011 with hemoglobin of 12.2 and high normal MCV  . Borderline hypertension     Normal CMet in 2011    . Skin cancer    Past Surgical History  Procedure Laterality Date  . Laryngectomy  1988    S/P laryngectomy and radiation therapy  . Appendectomy  1973  . Knee arthroscopy      Left  . Total knee arthroplasty      Bilateral, 19 years ago  . Cataract extraction, bilateral    . Decompression facial nerve      Right median  . Pacemaker insertion    . Insert / replace / remove pacemaker     Social History:  reports that he has quit smoking. His smoking use included Cigarettes. He smoked 0.00 packs per day. He quit smokeless tobacco use about 32 years ago. He reports that he does not drink alcohol or use illicit drugs.  No Known Allergies  Family History  Problem Relation Age of Onset  . Stroke Mother   . Leukemia Father   . Colon cancer Neg Hx   . Liver disease Neg Hx   . GI problems Neg Hx   . Stroke Other   . Diabetes Other      Prior to Admission medications   Medication Sig Start Date End Date Taking? Authorizing Provider  aspirin EC 81 MG tablet Take 81 mg by mouth daily.   Yes Historical Provider, MD  B Complex-C (B-COMPLEX WITH VITAMIN C) tablet Take 1 tablet by mouth daily.  Yes Historical Provider, MD  cholecalciferol (VITAMIN D) 1000 UNITS tablet Take 1,000 Units by mouth daily.   Yes Historical Provider, MD  docusate sodium (COLACE) 100 MG capsule Take 100 mg by mouth 2 (two) times daily.   Yes Historical Provider, MD  finasteride (PROSCAR) 5 MG tablet Take 5 mg by mouth at bedtime.   Yes Historical Provider, MD  HYDROcodone-acetaminophen (NORCO/VICODIN) 5-325 MG per tablet Take 1 tablet by mouth every 8 (eight) hours as needed for moderate pain or severe pain.  07/15/13  Yes Historical Provider, MD  levothyroxine (SYNTHROID, LEVOTHROID) 175 MCG tablet Take 175 mcg by mouth daily.   Yes Historical Provider, MD  magnesium oxide (MAG-OX) 400 MG tablet Take 400 mg by mouth 2 (two) times daily.   Yes Historical Provider, MD  methocarbamol (ROBAXIN) 750 MG tablet Take  750 mg by mouth 2 (two) times daily.   Yes Historical Provider, MD  Multiple Vitamin (MULTIVITAMIN WITH MINERALS) TABS Take 1 tablet by mouth daily.   Yes Historical Provider, MD  omeprazole (PRILOSEC) 20 MG capsule Take 20 mg by mouth daily.   Yes Historical Provider, MD  Tamsulosin HCl (FLOMAX) 0.4 MG CAPS Take 0.4 mg by mouth 2 (two) times daily.    Yes Historical Provider, MD  vitamin C (ASCORBIC ACID) 500 MG tablet Take 500 mg by mouth daily.   Yes Historical Provider, MD   Physical Exam: Filed Vitals:   08/31/14 0600 08/31/14 0603 08/31/14 0615 08/31/14 0905  BP: 129/65   114/80  Pulse: 60  60 82  Temp:  97.7 F (36.5 C)  97.9 F (36.6 C)  TempSrc:  Oral  Oral  Resp: 13  14 20   Height:    6\' 2"  (1.88 m)  Weight:    78.019 kg (172 lb)  SpO2:   96% 98%    Wt Readings from Last 3 Encounters:  08/31/14 78.019 kg (172 lb)  07/25/14 74.844 kg (165 lb)  07/17/14 77.111 kg (170 lb)    General:  Appears calm and comfortable Eyes: PERRL, normal lids, irises & conjunctiva ENT: grossly normal hearing, lips & tongue Neck: large patch noted over anterior neck Cardiovascular: RRR, no m/r/g. 1+ LE edema. Telemetry: SR, no arrhythmias  Respiratory: crackles at bases bilaterally. Normal respiratory effort. Abdomen: soft, ntnd Skin: no rash or induration seen on limited exam Musculoskeletal: grossly normal tone BUE/BLE Psychiatric: grossly normal mood and affect, speech difficult to comprehend since patient uses electronic voice box Neurologic: grossly non-focal.          Labs on Admission:  Basic Metabolic Panel:  Recent Labs Lab 08/31/14 0650  NA 131*  K 4.2  CL 98  CO2 27  GLUCOSE 96  BUN 19  CREATININE 0.70  CALCIUM 8.8   Liver Function Tests: No results for input(s): AST, ALT, ALKPHOS, BILITOT, PROT, ALBUMIN in the last 168 hours. No results for input(s): LIPASE, AMYLASE in the last 168 hours. No results for input(s): AMMONIA in the last 168 hours. CBC:  Recent  Labs Lab 08/31/14 0650  WBC 5.2  NEUTROABS 3.2  HGB 10.3*  HCT 29.9*  MCV 98.0  PLT 207   Cardiac Enzymes:  Recent Labs Lab 08/31/14 0650  TROPONINI <0.03    BNP (last 3 results)  Recent Labs  07/18/14 0002  PROBNP 1363.0*   CBG: No results for input(s): GLUCAP in the last 168 hours.  Radiological Exams on Admission: Dg Chest Port 1 View  08/31/2014   CLINICAL DATA:  Acute  onset of generalized chest pain. Initial encounter.  EXAM: PORTABLE CHEST - 1 VIEW  COMPARISON:  Chest radiograph performed 07/18/2014  FINDINGS: The lungs are well-aerated. There is elevation of the left hemidiaphragm. Increased interstitial markings are more prominent than on prior studies, concerning for mild interstitial edema superimposed on the patient's known chronic interstitial lung changes. Pneumonia could have a similar appearance. No definite pleural effusion or pneumothorax is seen.  The cardiomediastinal silhouette is enlarged. A pacemaker is noted overlying the left chest wall, with leads ending overlying the right atrium and right ventricle. No acute osseous abnormalities are seen.  IMPRESSION: Increased prominence of interstitial markings. This raises concern for mild interstitial edema, superimposed on known chronic lung changes. Pneumonia could have a similar appearance. Chronic elevation of the left hemidiaphragm. Cardiomegaly noted.   Electronically Signed   By: Garald Balding M.D.   On: 08/31/2014 06:52    EKG: Independently reviewed. Paced rhythm  Assessment/Plan Active Problems:   Hypothyroidism   GERD   PACEMAKER, PERMANENT   Hyponatremia   CHF exacerbation   Acute on chronic systolic CHF (congestive heart failure)   Chest pain   Dyspnea   1. Acute on chronic systolic congestive heart failure. Previous ejection fraction noted to be 30-35% approximately 1 year ago. Will start the patient on intravenous Lasix. Per CHF order set, he'll be started on low-dose lisinopril as well  as low-dose Lopressor. Will monitor intake and output. Repeat echocardiogram. 2. Chest pain. Likely related to #1. Cycle cardiac markers. 3. Shortness of breath. Related to #1. He is breathing comfortably on room air at this time. 4. Hyponatremia. Likely hypervolemic. Continue with diuresis and follow serum sodium. 5. GERD. Continue with proton pump inhibitors. 6. Hypothyroidism. Continue Synthroid.    Code Status: DNR DVT Prophylaxis: lovenox Family Communication: discussed with patient Disposition Plan: discharge home once improved  Time spent: 34mins  MEMON,JEHANZEB [facility] Pager [phone]

## 2014-08-31 NOTE — ED Notes (Signed)
Nitroglycerin not given at this time due to BP.

## 2014-08-31 NOTE — Progress Notes (Signed)
  Echocardiogram 2D Echocardiogram has been performed.  [place], [place] 08/31/2014, 2:35 PM

## 2014-08-31 NOTE — ED Notes (Signed)
Dr. Glick at bedside.  

## 2014-08-31 NOTE — ED Notes (Signed)
States he has had pain to left chest on and off since last night with sob associated with pain.

## 2014-08-31 NOTE — Progress Notes (Signed)
Pt. Complaining of left leg pain/cramps.  Applied heat.  Notified MD.  Will continue to monitor.

## 2014-09-01 DIAGNOSIS — I5023 Acute on chronic systolic (congestive) heart failure: Secondary | ICD-10-CM | POA: Diagnosis not present

## 2014-09-01 LAB — TROPONIN I

## 2014-09-01 LAB — BASIC METABOLIC PANEL
ANION GAP: 8 (ref 5–15)
BUN: 21 mg/dL (ref 6–23)
CO2: 29 mmol/L (ref 19–32)
Calcium: 8.7 mg/dL (ref 8.4–10.5)
Chloride: 93 mEq/L — ABNORMAL LOW (ref 96–112)
Creatinine, Ser: 0.73 mg/dL (ref 0.50–1.35)
GFR calc Af Amer: >90 mL/min (ref >90)
GFR, EST NON AFRICAN AMERICAN: 80 mL/min — AB (ref >90)
Glucose, Bld: 98 mg/dL (ref 70–99)
Potassium: 3.8 mmol/L (ref 3.5–5.1)
SODIUM: 130 mmol/L — AB (ref 135–145)

## 2014-09-01 MED ORDER — TRAZODONE HCL 50 MG PO TABS
50.0000 mg | ORAL_TABLET | Freq: Every evening | ORAL | Status: DC | PRN
  Administered 2014-09-01: 50 mg via ORAL
  Filled 2014-09-01: qty 1

## 2014-09-01 NOTE — Progress Notes (Signed)
Subjective: Andre Holder states that his chest feels better. Troponins are negative. Breathing is better. He has been treated with intravenous Lasix for his systolic heart failure. He has been compliant with oral therapy at home. He has a net negative fluid balance of 2095 ML's. Oxygen saturations are excellent.  Objective: Vital signs in last 24 hours: Filed Vitals:   08/31/14 2154 08/31/14 2200 09/01/14 0356 09/01/14 0530  BP: 80/52 93/53 99/49    Pulse: 67  60   Temp: 98.3 F (36.8 C)  97.7 F (36.5 C)   TempSrc: Oral  Oral   Resp: 20  18   Height:      Weight:    167 lb 15.9 oz (76.2 kg)  SpO2: 99%  97%    Weight change:   Intake/Output Summary (Last 24 hours) at 09/01/14 0752 Last data filed at 09/01/14 0300  Gross per 24 hour  Intake    480 ml  Output   2575 ml  Net  -2095 ml    Physical Exam: No distress. Lungs clear. Heart regular with a grade 2 systolic murmur. Abdomen is soft and nontender. Extremities reveal no edema.  He complains of pain in his feet last night. No palpable tenderness.  Lab Results:    Results for orders placed or performed during the hospital encounter of 08/31/14 (from the past 24 hour(s))  TSH     Status: Abnormal   Collection Time: 08/31/14 12:42 PM  Result Value Ref Range   TSH 7.674 (H) 0.350 - 4.500 uIU/mL  Troponin I     Status: None   Collection Time: 08/31/14 12:42 PM  Result Value Ref Range   Troponin I <0.03 <0.031 ng/mL  CBC     Status: Abnormal   Collection Time: 08/31/14 12:42 PM  Result Value Ref Range   WBC 5.6 4.0 - 10.5 K/uL   RBC 3.26 (L) 4.22 - 5.81 MIL/uL   Hemoglobin 10.8 (L) 13.0 - 17.0 g/dL   HCT 31.9 (L) 39.0 - 52.0 %   MCV 97.9 78.0 - 100.0 fL   MCH 33.1 26.0 - 34.0 pg   MCHC 33.9 30.0 - 36.0 g/dL   RDW 12.8 11.5 - 15.5 %   Platelets 228 150 - 400 K/uL  Creatinine, serum     Status: Abnormal   Collection Time: 08/31/14 12:42 PM  Result Value Ref Range   Creatinine, Ser 0.81 0.50 - 1.35 mg/dL   GFR calc  non Af Amer 77 (L) >90 mL/min   GFR calc Af Amer 89 (L) >90 mL/min  Troponin I     Status: None   Collection Time: 08/31/14  6:49 PM  Result Value Ref Range   Troponin I <0.03 <0.031 ng/mL  Troponin I     Status: None   Collection Time: 09/01/14 12:18 AM  Result Value Ref Range   Troponin I <0.03 <0.031 ng/mL  Basic metabolic panel     Status: Abnormal   Collection Time: 09/01/14  6:23 AM  Result Value Ref Range   Sodium 130 (L) 135 - 145 mmol/L   Potassium 3.8 3.5 - 5.1 mmol/L   Chloride 93 (L) 96 - 112 mEq/L   CO2 29 19 - 32 mmol/L   Glucose, Bld 98 70 - 99 mg/dL   BUN 21 6 - 23 mg/dL   Creatinine, Ser 0.73 0.50 - 1.35 mg/dL   Calcium 8.7 8.4 - 10.5 mg/dL   GFR calc non Af Amer 80 (L) >90 mL/min   GFR  calc Af Amer >90 >90 mL/min   Anion gap 8 5 - 15     ABGS No results for input(s): PHART, PO2ART, TCO2, HCO3 in the last 72 hours.  Invalid input(s): PCO2 CULTURES No results found for this or any previous visit (from the past 240 hour(s)). Studies/Results: Dg Chest Port 1 View  08/31/2014   CLINICAL DATA:  Acute onset of generalized chest pain. Initial encounter.  EXAM: PORTABLE CHEST - 1 VIEW  COMPARISON:  Chest radiograph performed 07/18/2014  FINDINGS: The lungs are well-aerated. There is elevation of the left hemidiaphragm. Increased interstitial markings are more prominent than on prior studies, concerning for mild interstitial edema superimposed on the patient's known chronic interstitial lung changes. Pneumonia could have a similar appearance. No definite pleural effusion or pneumothorax is seen.  The cardiomediastinal silhouette is enlarged. A pacemaker is noted overlying the left chest wall, with leads ending overlying the right atrium and right ventricle. No acute osseous abnormalities are seen.  IMPRESSION: Increased prominence of interstitial markings. This raises concern for mild interstitial edema, superimposed on known chronic lung changes. Pneumonia could have a  similar appearance. Chronic elevation of the left hemidiaphragm. Cardiomegaly noted.   Electronically Signed   By: Garald Balding M.D.   On: 08/31/2014 06:52   Micro Results: No results found for this or any previous visit (from the past 240 hour(s)). Studies/Results: Dg Chest Port 1 View  08/31/2014   CLINICAL DATA:  Acute onset of generalized chest pain. Initial encounter.  EXAM: PORTABLE CHEST - 1 VIEW  COMPARISON:  Chest radiograph performed 07/18/2014  FINDINGS: The lungs are well-aerated. There is elevation of the left hemidiaphragm. Increased interstitial markings are more prominent than on prior studies, concerning for mild interstitial edema superimposed on the patient's known chronic interstitial lung changes. Pneumonia could have a similar appearance. No definite pleural effusion or pneumothorax is seen.  The cardiomediastinal silhouette is enlarged. A pacemaker is noted overlying the left chest wall, with leads ending overlying the right atrium and right ventricle. No acute osseous abnormalities are seen.  IMPRESSION: Increased prominence of interstitial markings. This raises concern for mild interstitial edema, superimposed on known chronic lung changes. Pneumonia could have a similar appearance. Chronic elevation of the left hemidiaphragm. Cardiomegaly noted.   Electronically Signed   By: Garald Balding M.D.   On: 08/31/2014 06:52   Medications:  I have reviewed the patient's current medications Scheduled Meds: . aspirin EC  81 mg Oral Daily  . cholecalciferol  1,000 Units Oral Daily  . docusate sodium  100 mg Oral BID  . enoxaparin (LOVENOX) injection  40 mg Subcutaneous Daily  . finasteride  5 mg Oral QHS  . furosemide  40 mg Intravenous BID  . Influenza vac split quadrivalent PF  0.5 mL Intramuscular Tomorrow-1000  . levothyroxine  175 mcg Oral QAC breakfast  . lisinopril  2.5 mg Oral Daily  . magnesium oxide  400 mg Oral BID  . methocarbamol  750 mg Oral BID  . metoprolol  tartrate  12.5 mg Oral BID  . pantoprazole  40 mg Oral Daily  . sodium chloride  3 mL Intravenous Q12H  . tamsulosin  0.4 mg Oral BID   Continuous Infusions:  PRN Meds:.sodium chloride, acetaminophen, HYDROcodone-acetaminophen, MUSCLE RUB, nitroGLYCERIN, ondansetron (ZOFRAN) IV, sodium chloride   Assessment/Plan: #1. Chest pain. MI ruled out with negative troponins. He admits to having some heartburn. He is on Protonix. Continue Protonix. Reassured. #2. Congestive heart failure. Repeat echo reveals an  ejection fraction of 20-25% with mild aortic stenosis, mild to moderate aortic insufficiency and moderate mitral regurgitation. Continue IV Lasix. #3. Hyponatremia. Stable at 1:30. #4. Hypothyroidism. TSH is mildly elevated at 7. He is taking Synthroid 175 g daily. Active Problems:   Hypothyroidism   GERD   PACEMAKER, PERMANENT   Hyponatremia   CHF exacerbation   Acute on chronic systolic CHF (congestive heart failure)   Chest pain   Dyspnea     LOS: 1 day   Jobin Montelongo 09/01/2014, 7:52 AM

## 2014-09-01 NOTE — Progress Notes (Signed)
Patient has been informed several times not to get out of bed alone. Informed patient of safety concerns of him ambulating alone. Patient provided with urinal and call bell. Instructed patient how to use call bell. Patient continues to get up alone. Bed alarm in use.

## 2014-09-02 ENCOUNTER — Inpatient Hospital Stay (HOSPITAL_COMMUNITY)
Admission: EM | Admit: 2014-09-02 | Discharge: 2014-09-05 | DRG: 641 | Disposition: A | Discharge status: Home / Self Care | Payer: Non-veteran care | Attending: Internal Medicine | Admitting: Internal Medicine

## 2014-09-02 ENCOUNTER — Emergency Department (HOSPITAL_COMMUNITY): Payer: Non-veteran care

## 2014-09-02 ENCOUNTER — Encounter (HOSPITAL_COMMUNITY): Payer: Self-pay | Admitting: Emergency Medicine

## 2014-09-02 DIAGNOSIS — R51 Headache: Secondary | ICD-10-CM

## 2014-09-02 DIAGNOSIS — M1990 Unspecified osteoarthritis, unspecified site: Secondary | ICD-10-CM | POA: Diagnosis present

## 2014-09-02 DIAGNOSIS — Z96653 Presence of artificial knee joint, bilateral: Secondary | ICD-10-CM | POA: Diagnosis present

## 2014-09-02 DIAGNOSIS — E039 Hypothyroidism, unspecified: Secondary | ICD-10-CM | POA: Diagnosis present

## 2014-09-02 DIAGNOSIS — E871 Hypo-osmolality and hyponatremia: Secondary | ICD-10-CM | POA: Diagnosis not present

## 2014-09-02 DIAGNOSIS — I2510 Atherosclerotic heart disease of native coronary artery without angina pectoris: Secondary | ICD-10-CM | POA: Diagnosis present

## 2014-09-02 DIAGNOSIS — Z9842 Cataract extraction status, left eye: Secondary | ICD-10-CM

## 2014-09-02 DIAGNOSIS — E861 Hypovolemia: Secondary | ICD-10-CM | POA: Diagnosis present

## 2014-09-02 DIAGNOSIS — I5042 Chronic combined systolic (congestive) and diastolic (congestive) heart failure: Secondary | ICD-10-CM | POA: Diagnosis present

## 2014-09-02 DIAGNOSIS — Z950 Presence of cardiac pacemaker: Secondary | ICD-10-CM

## 2014-09-02 DIAGNOSIS — E278 Other specified disorders of adrenal gland: Secondary | ICD-10-CM | POA: Diagnosis present

## 2014-09-02 DIAGNOSIS — D649 Anemia, unspecified: Secondary | ICD-10-CM | POA: Diagnosis present

## 2014-09-02 DIAGNOSIS — I441 Atrioventricular block, second degree: Secondary | ICD-10-CM | POA: Diagnosis present

## 2014-09-02 DIAGNOSIS — R519 Headache, unspecified: Secondary | ICD-10-CM

## 2014-09-02 DIAGNOSIS — I429 Cardiomyopathy, unspecified: Secondary | ICD-10-CM | POA: Diagnosis present

## 2014-09-02 DIAGNOSIS — Z87891 Personal history of nicotine dependence: Secondary | ICD-10-CM

## 2014-09-02 DIAGNOSIS — Z8521 Personal history of malignant neoplasm of larynx: Secondary | ICD-10-CM

## 2014-09-02 DIAGNOSIS — Z85828 Personal history of other malignant neoplasm of skin: Secondary | ICD-10-CM

## 2014-09-02 DIAGNOSIS — Z9841 Cataract extraction status, right eye: Secondary | ICD-10-CM

## 2014-09-02 DIAGNOSIS — I350 Nonrheumatic aortic (valve) stenosis: Secondary | ICD-10-CM | POA: Diagnosis present

## 2014-09-02 DIAGNOSIS — E785 Hyperlipidemia, unspecified: Secondary | ICD-10-CM | POA: Diagnosis present

## 2014-09-02 DIAGNOSIS — R634 Abnormal weight loss: Secondary | ICD-10-CM | POA: Diagnosis present

## 2014-09-02 DIAGNOSIS — Z7982 Long term (current) use of aspirin: Secondary | ICD-10-CM

## 2014-09-02 DIAGNOSIS — K219 Gastro-esophageal reflux disease without esophagitis: Secondary | ICD-10-CM | POA: Diagnosis present

## 2014-09-02 DIAGNOSIS — R5383 Other fatigue: Secondary | ICD-10-CM | POA: Diagnosis not present

## 2014-09-02 DIAGNOSIS — I472 Ventricular tachycardia: Secondary | ICD-10-CM | POA: Diagnosis present

## 2014-09-02 DIAGNOSIS — I739 Peripheral vascular disease, unspecified: Secondary | ICD-10-CM | POA: Diagnosis present

## 2014-09-02 LAB — BASIC METABOLIC PANEL
ANION GAP: 7 (ref 5–15)
BUN: 22 mg/dL (ref 6–23)
CHLORIDE: 93 meq/L — AB (ref 96–112)
CO2: 29 mmol/L (ref 19–32)
CREATININE: 0.92 mg/dL (ref 0.50–1.35)
Calcium: 8.6 mg/dL (ref 8.4–10.5)
GFR calc non Af Amer: 73 mL/min — ABNORMAL LOW (ref >90)
GFR, EST AFRICAN AMERICAN: 84 mL/min — AB (ref >90)
Glucose, Bld: 98 mg/dL (ref 70–99)
Potassium: 3.7 mmol/L (ref 3.5–5.1)
Sodium: 129 mmol/L — ABNORMAL LOW (ref 135–145)

## 2014-09-02 LAB — CBC WITH DIFFERENTIAL/PLATELET
Basophils Absolute: 0 10*3/uL (ref 0.0–0.1)
Basophils Relative: 0 % (ref 0–1)
EOS ABS: 0.4 10*3/uL (ref 0.0–0.7)
EOS PCT: 6 % — AB (ref 0–5)
HCT: 31.1 % — ABNORMAL LOW (ref 39.0–52.0)
HEMOGLOBIN: 10.9 g/dL — AB (ref 13.0–17.0)
LYMPHS ABS: 1.1 10*3/uL (ref 0.7–4.0)
Lymphocytes Relative: 16 % (ref 12–46)
MCH: 33.6 pg (ref 26.0–34.0)
MCHC: 35 g/dL (ref 30.0–36.0)
MCV: 96 fL (ref 78.0–100.0)
MONOS PCT: 10 % (ref 3–12)
Monocytes Absolute: 0.7 10*3/uL (ref 0.1–1.0)
Neutro Abs: 4.7 10*3/uL (ref 1.7–7.7)
Neutrophils Relative %: 68 % (ref 43–77)
PLATELETS: 229 10*3/uL (ref 150–400)
RBC: 3.24 MIL/uL — ABNORMAL LOW (ref 4.22–5.81)
RDW: 12.6 % (ref 11.5–15.5)
WBC: 7 10*3/uL (ref 4.0–10.5)

## 2014-09-02 LAB — COMPREHENSIVE METABOLIC PANEL
ALT: 16 U/L (ref 0–53)
ANION GAP: 7 (ref 5–15)
AST: 20 U/L (ref 0–37)
Albumin: 3.6 g/dL (ref 3.5–5.2)
Alkaline Phosphatase: 70 U/L (ref 39–117)
BUN: 26 mg/dL — AB (ref 6–23)
CO2: 25 mmol/L (ref 19–32)
CREATININE: 1.04 mg/dL (ref 0.50–1.35)
Calcium: 8.6 mg/dL (ref 8.4–10.5)
Chloride: 93 mEq/L — ABNORMAL LOW (ref 96–112)
GFR calc non Af Amer: 62 mL/min — ABNORMAL LOW (ref >90)
GFR, EST AFRICAN AMERICAN: 71 mL/min — AB (ref >90)
GLUCOSE: 110 mg/dL — AB (ref 70–99)
Potassium: 3.6 mmol/L (ref 3.5–5.1)
Sodium: 125 mmol/L — ABNORMAL LOW (ref 135–145)
Total Bilirubin: 0.5 mg/dL (ref 0.3–1.2)
Total Protein: 6.4 g/dL (ref 6.0–8.3)

## 2014-09-02 LAB — BRAIN NATRIURETIC PEPTIDE: B NATRIURETIC PEPTIDE 5: 357 pg/mL — AB (ref 0.0–100.0)

## 2014-09-02 LAB — TROPONIN I

## 2014-09-02 LAB — MAGNESIUM: Magnesium: 1.9 mg/dL (ref 1.5–2.5)

## 2014-09-02 MED ORDER — METOPROLOL TARTRATE 25 MG PO TABS: 25.0000 mg | ORAL_TABLET | Freq: Two times a day (BID) | ORAL | Status: DC

## 2014-09-02 MED ORDER — LISINOPRIL 10 MG PO TABS
10.0000 mg | ORAL_TABLET | Freq: Every day | ORAL | Status: DC
  Administered 2014-09-02: 10 mg via ORAL
  Filled 2014-09-02: qty 1

## 2014-09-02 MED ORDER — METOPROLOL TARTRATE 25 MG PO TABS
25.0000 mg | ORAL_TABLET | Freq: Two times a day (BID) | ORAL | Status: DC
  Administered 2014-09-02: 25 mg via ORAL
  Filled 2014-09-02: qty 1

## 2014-09-02 MED ORDER — TETRACAINE HCL 0.5 % OP SOLN
2.0000 [drp] | Freq: Once | OPHTHALMIC | Status: AC
  Administered 2014-09-02: 2 [drp] via OPHTHALMIC
  Filled 2014-09-02: qty 2

## 2014-09-02 MED ORDER — SODIUM CHLORIDE 0.9 % IV SOLN
Freq: Once | INTRAVENOUS | Status: AC
  Administered 2014-09-03: via INTRAVENOUS

## 2014-09-02 MED ORDER — SODIUM CHLORIDE 0.9 % IV BOLUS (SEPSIS)
500.0000 mL | Freq: Once | INTRAVENOUS | Status: AC
  Administered 2014-09-02: 500 mL via INTRAVENOUS

## 2014-09-02 MED ORDER — LISINOPRIL 10 MG PO TABS: 10.0000 mg | ORAL_TABLET | Freq: Every day | ORAL | Status: DC

## 2014-09-02 NOTE — Progress Notes (Signed)
Andre Holder discharged home with wife per MD order.  Discharge instructions reviewed and discussed with the patient and wife at bedside, all questions and concerns answered. Copy of instructions and scripts given to patient and wife.    Medication List    STOP taking these medications        multivitamin with minerals Tabs tablet      TAKE these medications        aspirin EC 81 MG tablet  Take 81 mg by mouth daily.     B-complex with vitamin C tablet  Take 1 tablet by mouth daily.     cholecalciferol 1000 UNITS tablet  Commonly known as:  VITAMIN D  Take 1,000 Units by mouth daily.     docusate sodium 100 MG capsule  Commonly known as:  COLACE  Take 100 mg by mouth 2 (two) times daily.     finasteride 5 MG tablet  Commonly known as:  PROSCAR  Take 5 mg by mouth at bedtime.     HYDROcodone-acetaminophen 5-325 MG per tablet  Commonly known as:  NORCO/VICODIN  Take 1 tablet by mouth every 8 (eight) hours as needed for moderate pain or severe pain.     levothyroxine 175 MCG tablet  Commonly known as:  SYNTHROID, LEVOTHROID  Take 175 mcg by mouth daily.     lisinopril 10 MG tablet  Commonly known as:  PRINIVIL,ZESTRIL  Take 1 tablet (10 mg total) by mouth daily.     magnesium oxide 400 MG tablet  Commonly known as:  MAG-OX  Take 400 mg by mouth 2 (two) times daily.     methocarbamol 750 MG tablet  Commonly known as:  ROBAXIN  Take 750 mg by mouth 2 (two) times daily.     metoprolol tartrate 25 MG tablet  Commonly known as:  LOPRESSOR  Take 1 tablet (25 mg total) by mouth 2 (two) times daily.     omeprazole 20 MG capsule  Commonly known as:  PRILOSEC  Take 20 mg by mouth daily.     tamsulosin 0.4 MG Caps capsule  Commonly known as:  FLOMAX  Take 0.4 mg by mouth 2 (two) times daily.     vitamin C 500 MG tablet  Commonly known as:  ASCORBIC ACID  Take 500 mg by mouth daily.        Patients skin is clean, dry and intact, no evidence of skin break  down. IV site discontinued and catheter remains intact. Site without signs and symptoms of complications. Dressing and pressure applied.  Patient escorted to car by Lovena Le, RN in a wheelchair,  no distress noted upon discharge.  [name] 09/02/2014 1:10 PM

## 2014-09-02 NOTE — Discharge Summary (Signed)
Physician Discharge Summary  Andre Holder MWN:[id number] DOB: 12-27-1922 DOA: 08/31/2014   Admit date: 08/31/2014 Discharge date: 09/02/2014  Discharge Diagnoses:  Active Problems:   Hypothyroidism   GERD   PACEMAKER, PERMANENT   Hyponatremia   CHF exacerbation   Acute on chronic systolic CHF (congestive heart failure)   Chest pain   Dyspnea    Wt Readings from Last 3 Encounters:  09/02/14 167 lb 3.2 oz (75.841 kg)  07/25/14 165 lb (74.844 kg)  07/17/14 170 lb (77.111 kg)     Hospital Course:  This patient is an 80 year old white male who was admitted with chest pain. He ruled out for an MI with negative troponins. A pacemaker is in place. His symptoms resolved. He underwent an echocardiogram which revealed an ejection fraction of 20-25%. He has been on Lasix chronically for chronic systolic heart failure. He was treated with intravenous Lasix. He has responded well to it. There was evidence initially of volume overload by chest x-ray and elevation of his BNP. His discharge exam reveals significant improvement with no shortness of breath currently. Lasix will be continued orally. Lisinopril and metoprolol have been added. He has a mild hyponatremia at 129-130. TSH is mildly elevated at 7 on his current Synthroid dose of 175 g daily he has previously been euthyroid on this dose. This will be followed up as an outpatient.  His condition at discharge is much improved. He will be seen in follow-up in my office in one week.   Discharge Instructions     Medication List    STOP taking these medications        multivitamin with minerals Tabs tablet      TAKE these medications        aspirin EC 81 MG tablet  Take 81 mg by mouth daily.     B-complex with vitamin C tablet  Take 1 tablet by mouth daily.     cholecalciferol 1000 UNITS tablet  Commonly known as:  VITAMIN D  Take 1,000 Units by mouth daily.     docusate sodium 100 MG capsule  Commonly known as:  COLACE   Take 100 mg by mouth 2 (two) times daily.     finasteride 5 MG tablet  Commonly known as:  PROSCAR  Take 5 mg by mouth at bedtime.     HYDROcodone-acetaminophen 5-325 MG per tablet  Commonly known as:  NORCO/VICODIN  Take 1 tablet by mouth every 8 (eight) hours as needed for moderate pain or severe pain.     levothyroxine 175 MCG tablet  Commonly known as:  SYNTHROID, LEVOTHROID  Take 175 mcg by mouth daily.     lisinopril 10 MG tablet  Commonly known as:  PRINIVIL,ZESTRIL  Take 1 tablet (10 mg total) by mouth daily.     magnesium oxide 400 MG tablet  Commonly known as:  MAG-OX  Take 400 mg by mouth 2 (two) times daily.     methocarbamol 750 MG tablet  Commonly known as:  ROBAXIN  Take 750 mg by mouth 2 (two) times daily.     metoprolol tartrate 25 MG tablet  Commonly known as:  LOPRESSOR  Take 1 tablet (25 mg total) by mouth 2 (two) times daily.     omeprazole 20 MG capsule  Commonly known as:  PRILOSEC  Take 20 mg by mouth daily.     tamsulosin 0.4 MG Caps capsule  Commonly known as:  FLOMAX  Take 0.4 mg by mouth 2 (two) times daily.  vitamin C 500 MG tablet  Commonly known as:  ASCORBIC ACID  Take 500 mg by mouth daily.         Andre Holder 09/02/2014

## 2014-09-02 NOTE — ED Notes (Signed)
MD at the bedside to check pressure in eyes

## 2014-09-02 NOTE — ED Provider Notes (Signed)
CSN: [id number]     Arrival date & time 09/02/14  2003 History  This chart was scribed for Andre Essex, MD by Peyton Bottoms, ED Scribe. This patient was seen in room APA14/[facility]14 and the patient's care was started at 8:21 PM.   Chief Complaint  Patient presents with  . Headache  . Fatigue   Patient is a 80 y.o. male presenting with headaches. The history is provided by the patient. No language interpreter was used.  Headache   HPI Comments: Andre Holder is a 80 y.o. male who presents to the Emergency Department complaining of shortness of breath, fatigue, chest tightness and moderate headache that began 2 hours ago. He reports associated nausea. He reports blurry vision in his right eye but states this is consistent with baseline. He states that his "chest feels full". He also reports associated dizziness. He also reports "fluid in my lungs".   Past Medical History  Diagnosis Date  . Arteriosclerotic cardiovascular disease (ASCVD)     Nonobstructive; 09/2008 50% proximal and 40% mid LAD; 25% circumflex; 30% RCA; mild global LV dysfunction with EF of 45%. No aortic stenosis.  . Mild aortic stenosis     not documented at catheterization; verified by echo in 2011  . Peripheral vascular disease     With a 70% innominate artery stenosis and nonobstructive carotid stenosis  . Hypothyroidism   . Degenerative joint disease     s/p bilateral TKR  . Mobitz (type) II atrioventricular block     With bradycardia; Medtronic pacemaker implanted in 09/2008  . Tobacco abuse, in remission     Remote  . GERD (gastroesophageal reflux disease)   . Hyperlipidemia     Lipid profile in 04/2010:115, 98, 43, 52.  . Cancer of larynx     laryngectomy in 1988; postoperative radiation therapy  . Weight loss     50 pounds between 1991 and 2011  . 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 Adrenal hyperplasia     Stable on serial imaging  . Anemia     minimal in  2011 with hemoglobin of 12.2 and high normal MCV  . Borderline hypertension     Normal CMet in 2011  . Skin cancer    Past Surgical History  Procedure Laterality Date  . Laryngectomy  1988    S/P laryngectomy and radiation therapy  . Appendectomy  1973  . Knee arthroscopy      Left  . Total knee arthroplasty      Bilateral, 19 years ago  . Cataract extraction, bilateral    . Decompression facial nerve      Right median  . Pacemaker insertion    . Insert / replace / remove pacemaker     Family History  Problem Relation Age of Onset  . Stroke Mother   . Leukemia Father   . Colon cancer Neg Hx   . Liver disease Neg Hx   . GI problems Neg Hx   . Stroke Other   . Diabetes Other    History  Substance Use Topics  . Smoking status: Former Smoker    Types: Cigarettes  . Smokeless tobacco: Former Systems developer    Quit date: 09/08/1982     Comment: Quit 30 years  . Alcohol Use: No   Review of Systems  Neurological: Positive for headaches.   A complete 10 system review of systems was obtained and all systems are negative  except as noted in the HPI and PMH.    Allergies  Review of patient's allergies indicates no known allergies.  Home Medications   Prior to Admission medications   Medication Sig Start Date End Date Taking? Authorizing Provider  aspirin EC 81 MG tablet Take 81 mg by mouth daily.   Yes Historical Provider, MD  B Complex-C (B-COMPLEX WITH VITAMIN C) tablet Take 1 tablet by mouth daily.   Yes Historical Provider, MD  docusate sodium (COLACE) 100 MG capsule Take 100 mg by mouth 2 (two) times daily.   Yes Historical Provider, MD  finasteride (PROSCAR) 5 MG tablet Take 5 mg by mouth at bedtime.   Yes Historical Provider, MD  Fish Oil-Cholecalciferol (FISH OIL + D3) 1200-1000 MG-UNIT CAPS Take 1 capsule by mouth daily.   Yes Historical Provider, MD  HYDROcodone-acetaminophen (NORCO/VICODIN) 5-325 MG per tablet Take 1 tablet by mouth every 8 (eight) hours as needed for  moderate pain or severe pain.  07/15/13  Yes Historical Provider, MD  levothyroxine (SYNTHROID, LEVOTHROID) 175 MCG tablet Take 175 mcg by mouth every evening.    Yes Historical Provider, MD  lisinopril (PRINIVIL,ZESTRIL) 10 MG tablet Take 1 tablet (10 mg total) by mouth daily. 09/02/14  Yes Asencion Noble, MD  magnesium oxide (MAG-OX) 400 MG tablet Take 400 mg by mouth 2 (two) times daily.   Yes Historical Provider, MD  methocarbamol (ROBAXIN) 750 MG tablet Take 750 mg by mouth 2 (two) times daily.   Yes Historical Provider, MD  metoprolol tartrate (LOPRESSOR) 25 MG tablet Take 1 tablet (25 mg total) by mouth 2 (two) times daily. 09/02/14  Yes Asencion Noble, MD  omeprazole (PRILOSEC) 20 MG capsule Take 20 mg by mouth daily.   Yes Historical Provider, MD  Tamsulosin HCl (FLOMAX) 0.4 MG CAPS Take 0.4 mg by mouth 2 (two) times daily.    Yes Historical Provider, MD  vitamin C (ASCORBIC ACID) 500 MG tablet Take 500 mg by mouth daily.   Yes Historical Provider, MD   Triage Vitals: BP 99/55 mmHg  Pulse 58  Temp(Src) 98.2 F (36.8 C) (Oral)  Resp 24  SpO2 96%  Physical Exam  Constitutional: He is oriented to person, place, and time. He appears well-developed and well-nourished. No distress.  HENT:  Head: Normocephalic and atraumatic.  Mouth/Throat: Oropharynx is clear and moist. No oropharyngeal exudate.  IOP 14 on R  Eyes: Conjunctivae and EOM are normal. Pupils are equal, round, and reactive to light.  Pupils are irregular and non reactive. No temporal artery tenderness noted.  Neck: Normal range of motion. Neck supple.  No meningismus.  Cardiovascular: Normal rate, regular rhythm, normal heart sounds and intact distal pulses.   No murmur heard. Pulmonary/Chest: Effort normal and breath sounds normal. No respiratory distress.  Abdominal: Soft. There is no tenderness. There is no rebound and no guarding.  Musculoskeletal: Normal range of motion. He exhibits no edema or tenderness.  Neurological: He  is alert and oriented to person, place, and time. No cranial nerve deficit. He exhibits normal muscle tone. Coordination normal.  No ataxia on finger to nose bilaterally. No pronator drift. 5/5 strength throughout. CN 2-12 intact. Equal grip strength. Sensation intact.  Skin: Skin is warm.  Psychiatric: He has a normal mood and affect. His behavior is normal.  Nursing note and vitals reviewed.  ED Course  Procedures (including critical care time)  DIAGNOSTIC STUDIES: Oxygen Saturation is 96% on RA, normal by my interpretation.    COORDINATION OF CARE:  8:25 PM- Discussed plans to order diagnostic CXR, CT of head, EKG and lab work. Pt advised of plan for treatment and pt agrees.  Labs Review Labs Reviewed  CBC WITH DIFFERENTIAL - Abnormal; Notable for the following:    RBC 3.24 (*)    Hemoglobin 10.9 (*)    HCT 31.1 (*)    Eosinophils Relative 6 (*)    All other components within normal limits  COMPREHENSIVE METABOLIC PANEL - Abnormal; Notable for the following:    Sodium 125 (*)    Chloride 93 (*)    Glucose, Bld 110 (*)    BUN 26 (*)    GFR calc non Af Amer 62 (*)    GFR calc Af Amer 71 (*)    All other components within normal limits  BRAIN NATRIURETIC PEPTIDE - Abnormal; Notable for the following:    B Natriuretic Peptide 357.0 (*)    All other components within normal limits  TROPONIN I  COMPREHENSIVE METABOLIC PANEL  CBC WITH DIFFERENTIAL    Imaging Review Dg Chest 2 View  09/02/2014   CLINICAL DATA:  Shortness of breath and fatigue 4 2 hr  EXAM: CHEST  2 VIEW  COMPARISON:  08/31/2014  FINDINGS: Cardiac shadow is at the upper limits of normal in size. A pacing device is again seen and stable. Elevation of left hemidiaphragm is again seen. No focal infiltrate or sizable effusion is noted. Mild hyperinflation consistent with COPD is seen. Mild interstitial changes are noted without focal confluent infiltrate.  IMPRESSION: No acute abnormality noted.   Electronically Signed    By: Inez Catalina M.D.   On: 09/02/2014 21:37   Ct Head Wo Contrast  09/02/2014   CLINICAL DATA:  Shortness of breath, fatigue, headache, dizziness.  EXAM: CT HEAD WITHOUT CONTRAST  TECHNIQUE: Contiguous axial images were obtained from the base of the skull through the vertex without intravenous contrast.  COMPARISON:  04/13/2012  FINDINGS: There is atrophy and chronic small vessel disease changes. 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 No acute infarct. No hemorrhage or hydrocephalus. No acute calvarial abnormality.  Mucosal thickening throughout the ethmoid air cells. Mastoid air cells are clear.  IMPRESSION: No acute intracranial abnormality.   Electronically Signed   By: Rolm Baptise M.D.   On: 09/02/2014 21:50     EKG Interpretation   Date/Time:  Saturday September 02 2014 20:55:39 EST Ventricular Rate:  60 PR Interval:  214 QRS Duration: 205 QT Interval:  478 QTC Calculation: 478 R Axis:   7 Text Interpretation:  Atrial-ventricular dual-paced rhythm No further  analysis attempted due to paced rhythm Baseline wander in lead(s) II aVR  V5 paced No significant change was found Confirmed by Morrison  [phone]) on 09/02/2014 9:51:40 PM     MDM   Final diagnoses:  Headache  Hyponatremia  discharged from hospital today after CHF exxacerbation.  Developed headache 2 hours ago with fatigue.  Endorse blurry vision in R eye. No fever.  No CP or SOB.  EKG paced. CXR clear. nonfocal neuro exam.  CT head negative. Sodium 125.  Baseline appears to be 130s. BP soft in 80s and 90s.  Gentle IVF given.  EF 25% on last echo.  Suspect overdiuresis.  Gentle hydration continued to correct BP and sodium.  Admission dw Dr. Ernestina Patches.   I personally performed the services described in this documentation, which was scribed in my presence. The recorded information has been reviewed and is accurate.  Andre Essex, MD  09/03/14 0232 

## 2014-09-02 NOTE — Progress Notes (Signed)
Patient has a run of a 10 beat VTach. Pt was asleep at the time and asymptomatic. MD notified. Magnesium lab ordered for this morning. Will continue to monitor.

## 2014-09-02 NOTE — ED Notes (Signed)
Pt was discharged today for hospital. BP 116/78, Hr 66. RR16 97% on room air in route with EMS.

## 2014-09-03 DIAGNOSIS — Z9841 Cataract extraction status, right eye: Secondary | ICD-10-CM | POA: Diagnosis not present

## 2014-09-03 DIAGNOSIS — R634 Abnormal weight loss: Secondary | ICD-10-CM | POA: Diagnosis present

## 2014-09-03 DIAGNOSIS — Z87891 Personal history of nicotine dependence: Secondary | ICD-10-CM | POA: Diagnosis not present

## 2014-09-03 DIAGNOSIS — I350 Nonrheumatic aortic (valve) stenosis: Secondary | ICD-10-CM | POA: Diagnosis present

## 2014-09-03 DIAGNOSIS — E039 Hypothyroidism, unspecified: Secondary | ICD-10-CM | POA: Diagnosis present

## 2014-09-03 DIAGNOSIS — I472 Ventricular tachycardia: Secondary | ICD-10-CM | POA: Diagnosis present

## 2014-09-03 DIAGNOSIS — Z950 Presence of cardiac pacemaker: Secondary | ICD-10-CM | POA: Diagnosis not present

## 2014-09-03 DIAGNOSIS — Z96653 Presence of artificial knee joint, bilateral: Secondary | ICD-10-CM | POA: Diagnosis present

## 2014-09-03 DIAGNOSIS — I441 Atrioventricular block, second degree: Secondary | ICD-10-CM | POA: Diagnosis present

## 2014-09-03 DIAGNOSIS — Z7982 Long term (current) use of aspirin: Secondary | ICD-10-CM | POA: Diagnosis not present

## 2014-09-03 DIAGNOSIS — Z8521 Personal history of malignant neoplasm of larynx: Secondary | ICD-10-CM | POA: Diagnosis not present

## 2014-09-03 DIAGNOSIS — K219 Gastro-esophageal reflux disease without esophagitis: Secondary | ICD-10-CM | POA: Diagnosis present

## 2014-09-03 DIAGNOSIS — I2510 Atherosclerotic heart disease of native coronary artery without angina pectoris: Secondary | ICD-10-CM | POA: Diagnosis present

## 2014-09-03 DIAGNOSIS — M1990 Unspecified osteoarthritis, unspecified site: Secondary | ICD-10-CM | POA: Diagnosis present

## 2014-09-03 DIAGNOSIS — E871 Hypo-osmolality and hyponatremia: Secondary | ICD-10-CM | POA: Diagnosis present

## 2014-09-03 DIAGNOSIS — D649 Anemia, unspecified: Secondary | ICD-10-CM | POA: Diagnosis present

## 2014-09-03 DIAGNOSIS — I739 Peripheral vascular disease, unspecified: Secondary | ICD-10-CM | POA: Diagnosis present

## 2014-09-03 DIAGNOSIS — R079 Chest pain, unspecified: Secondary | ICD-10-CM

## 2014-09-03 DIAGNOSIS — R5383 Other fatigue: Secondary | ICD-10-CM | POA: Diagnosis present

## 2014-09-03 DIAGNOSIS — E861 Hypovolemia: Secondary | ICD-10-CM | POA: Diagnosis present

## 2014-09-03 DIAGNOSIS — Z9842 Cataract extraction status, left eye: Secondary | ICD-10-CM | POA: Diagnosis not present

## 2014-09-03 DIAGNOSIS — E278 Other specified disorders of adrenal gland: Secondary | ICD-10-CM | POA: Diagnosis present

## 2014-09-03 DIAGNOSIS — I429 Cardiomyopathy, unspecified: Secondary | ICD-10-CM | POA: Diagnosis present

## 2014-09-03 DIAGNOSIS — E785 Hyperlipidemia, unspecified: Secondary | ICD-10-CM | POA: Diagnosis present

## 2014-09-03 DIAGNOSIS — I5042 Chronic combined systolic (congestive) and diastolic (congestive) heart failure: Secondary | ICD-10-CM | POA: Diagnosis present

## 2014-09-03 DIAGNOSIS — Z85828 Personal history of other malignant neoplasm of skin: Secondary | ICD-10-CM | POA: Diagnosis not present

## 2014-09-03 LAB — COMPREHENSIVE METABOLIC PANEL
ALT: 17 U/L (ref 0–53)
AST: 21 U/L (ref 0–37)
Albumin: 3.8 g/dL (ref 3.5–5.2)
Alkaline Phosphatase: 75 U/L (ref 39–117)
Anion gap: 7 (ref 5–15)
BUN: 23 mg/dL (ref 6–23)
CALCIUM: 8.7 mg/dL (ref 8.4–10.5)
CO2: 25 mmol/L (ref 19–32)
CREATININE: 0.83 mg/dL (ref 0.50–1.35)
Chloride: 94 mEq/L — ABNORMAL LOW (ref 96–112)
GFR, EST AFRICAN AMERICAN: 88 mL/min — AB (ref >90)
GFR, EST NON AFRICAN AMERICAN: 76 mL/min — AB (ref >90)
GLUCOSE: 97 mg/dL (ref 70–99)
Potassium: 3.8 mmol/L (ref 3.5–5.1)
Sodium: 126 mmol/L — ABNORMAL LOW (ref 135–145)
Total Bilirubin: 0.4 mg/dL (ref 0.3–1.2)
Total Protein: 6.7 g/dL (ref 6.0–8.3)

## 2014-09-03 LAB — CBC WITH DIFFERENTIAL/PLATELET
BASOS ABS: 0.1 10*3/uL (ref 0.0–0.1)
Basophils Relative: 1 % (ref 0–1)
Eosinophils Absolute: 0.5 10*3/uL (ref 0.0–0.7)
Eosinophils Relative: 8 % — ABNORMAL HIGH (ref 0–5)
HCT: 31.8 % — ABNORMAL LOW (ref 39.0–52.0)
Hemoglobin: 11 g/dL — ABNORMAL LOW (ref 13.0–17.0)
LYMPHS ABS: 0.9 10*3/uL (ref 0.7–4.0)
LYMPHS PCT: 14 % (ref 12–46)
MCH: 33.3 pg (ref 26.0–34.0)
MCHC: 34.6 g/dL (ref 30.0–36.0)
MCV: 96.4 fL (ref 78.0–100.0)
Monocytes Absolute: 0.8 10*3/uL (ref 0.1–1.0)
Monocytes Relative: 12 % (ref 3–12)
Neutro Abs: 4.6 10*3/uL (ref 1.7–7.7)
Neutrophils Relative %: 65 % (ref 43–77)
PLATELETS: 239 10*3/uL (ref 150–400)
RBC: 3.3 MIL/uL — AB (ref 4.22–5.81)
RDW: 12.7 % (ref 11.5–15.5)
WBC: 6.9 10*3/uL (ref 4.0–10.5)

## 2014-09-03 MED ORDER — SODIUM CHLORIDE 0.9 % IV SOLN
INTRAVENOUS | Status: DC
  Administered 2014-09-03 – 2014-09-04 (×3): via INTRAVENOUS

## 2014-09-03 MED ORDER — PANTOPRAZOLE SODIUM 40 MG PO TBEC
40.0000 mg | DELAYED_RELEASE_TABLET | Freq: Every day | ORAL | Status: DC
  Administered 2014-09-03 – 2014-09-04 (×2): 40 mg via ORAL
  Filled 2014-09-03 (×2): qty 1

## 2014-09-03 MED ORDER — HEPARIN SODIUM (PORCINE) 5000 UNIT/ML IJ SOLN: 5000.0000 [IU] | Freq: Three times a day (TID) | INTRAMUSCULAR | Status: DC

## 2014-09-03 MED ORDER — LEVOTHYROXINE SODIUM 75 MCG PO TABS
175.0000 ug | ORAL_TABLET | Freq: Every evening | ORAL | Status: DC
  Administered 2014-09-03 – 2014-09-04 (×2): 175 ug via ORAL
  Filled 2014-09-03 (×4): qty 1

## 2014-09-03 MED ORDER — ASPIRIN EC 81 MG PO TBEC
81.0000 mg | DELAYED_RELEASE_TABLET | Freq: Every day | ORAL | Status: DC
  Administered 2014-09-03 – 2014-09-04 (×2): 81 mg via ORAL
  Filled 2014-09-03 (×2): qty 1

## 2014-09-03 MED ORDER — HEPARIN SODIUM (PORCINE) 5000 UNIT/ML IJ SOLN
5000.0000 [IU] | Freq: Three times a day (TID) | INTRAMUSCULAR | Status: DC
  Administered 2014-09-03 – 2014-09-05 (×7): 5000 [IU] via SUBCUTANEOUS
  Filled 2014-09-03 (×7): qty 1

## 2014-09-03 MED ORDER — FINASTERIDE 5 MG PO TABS
5.0000 mg | ORAL_TABLET | Freq: Every day | ORAL | Status: DC
  Administered 2014-09-03 – 2014-09-04 (×2): 5 mg via ORAL
  Filled 2014-09-03 (×4): qty 1

## 2014-09-03 MED ORDER — HYDROCODONE-ACETAMINOPHEN 5-325 MG PO TABS
1.0000 | ORAL_TABLET | Freq: Three times a day (TID) | ORAL | Status: DC | PRN
  Administered 2014-09-04: 1 via ORAL
  Filled 2014-09-03 (×2): qty 1

## 2014-09-03 MED ORDER — SODIUM CHLORIDE 0.9 % IJ SOLN
3.0000 mL | Freq: Two times a day (BID) | INTRAMUSCULAR | Status: DC
  Administered 2014-09-03 – 2014-09-04 (×4): 3 mL via INTRAVENOUS

## 2014-09-03 MED ORDER — TAMSULOSIN HCL 0.4 MG PO CAPS
0.4000 mg | ORAL_CAPSULE | Freq: Two times a day (BID) | ORAL | Status: DC
  Administered 2014-09-03 – 2014-09-04 (×4): 0.4 mg via ORAL
  Filled 2014-09-03 (×5): qty 1

## 2014-09-03 MED ORDER — VITAMIN C 500 MG PO TABS
500.0000 mg | ORAL_TABLET | Freq: Every day | ORAL | Status: DC
  Administered 2014-09-03 – 2014-09-04 (×2): 500 mg via ORAL
  Filled 2014-09-03 (×2): qty 1

## 2014-09-03 MED ORDER — SODIUM CHLORIDE 0.9 % IV SOLN: INTRAVENOUS | Status: AC

## 2014-09-03 NOTE — Progress Notes (Signed)
UR completed 

## 2014-09-03 NOTE — Progress Notes (Signed)
Subjective: Andre Holder was discharged after treatment of his CHF and after ruling out for an MI. He was readmitted after presenting with weakness and a hyponatremia. His serum sodium a drop from 1:30 to 126. Lasix has been held and he has been treated with IV fluids. He has had systolic pressures in the 70s and 80s but is now improved to 100. Lisinopril and metoprolol have been held. He is breathing comfortably now.  Objective: Vital signs in last 24 hours: Filed Vitals:   09/03/14 0015 09/03/14 0030 09/03/14 0127 09/03/14 0624  BP: 75/53 74/53 74/52  102/58  Pulse:   60 72  Temp:   98.7 F (37.1 C) 98.1 F (36.7 C)  TempSrc:   Oral Oral  Resp: 13 13 14 14   Height:   6' 2.5" (1.892 m)   Weight:   172 lb 9.9 oz (78.3 kg)   SpO2:   93% 98%   Weight change:   Intake/Output Summary (Last 24 hours) at 09/03/14 0815 Last data filed at 09/03/14 0600  Gross per 24 hour  Intake  582.5 ml  Output    300 ml  Net  282.5 ml    Physical Exam: No distress. No dyspnea. Lungs clear. Heart regular with a grade 2 systolic murmur consistent with his mild aortic stenosis. Abdomen soft and nontender. Extremities reveal no edema. Neurologic status at baseline.  Lab Results:    Results for orders placed or performed during the hospital encounter of 09/02/14 (from the past 24 hour(s))  CBC with Differential     Status: Abnormal   Collection Time: 09/02/14 10:03 PM  Result Value Ref Range   WBC 7.0 4.0 - 10.5 K/uL   RBC 3.24 (L) 4.22 - 5.81 MIL/uL   Hemoglobin 10.9 (L) 13.0 - 17.0 g/dL   HCT 31.1 (L) 39.0 - 52.0 %   MCV 96.0 78.0 - 100.0 fL   MCH 33.6 26.0 - 34.0 pg   MCHC 35.0 30.0 - 36.0 g/dL   RDW 12.6 11.5 - 15.5 %   Platelets 229 150 - 400 K/uL   Neutrophils Relative % 68 43 - 77 %   Neutro Abs 4.7 1.7 - 7.7 K/uL   Lymphocytes Relative 16 12 - 46 %   Lymphs Abs 1.1 0.7 - 4.0 K/uL   Monocytes Relative 10 3 - 12 %   Monocytes Absolute 0.7 0.1 - 1.0 K/uL   Eosinophils Relative 6 (H) 0 - 5 %    Eosinophils Absolute 0.4 0.0 - 0.7 K/uL   Basophils Relative 0 0 - 1 %   Basophils Absolute 0.0 0.0 - 0.1 K/uL  Comprehensive metabolic panel     Status: Abnormal   Collection Time: 09/02/14 10:03 PM  Result Value Ref Range   Sodium 125 (L) 135 - 145 mmol/L   Potassium 3.6 3.5 - 5.1 mmol/L   Chloride 93 (L) 96 - 112 mEq/L   CO2 25 19 - 32 mmol/L   Glucose, Bld 110 (H) 70 - 99 mg/dL   BUN 26 (H) 6 - 23 mg/dL   Creatinine, Ser 1.04 0.50 - 1.35 mg/dL   Calcium 8.6 8.4 - 10.5 mg/dL   Total Protein 6.4 6.0 - 8.3 g/dL   Albumin 3.6 3.5 - 5.2 g/dL   AST 20 0 - 37 U/L   ALT 16 0 - 53 U/L   Alkaline Phosphatase 70 39 - 117 U/L   Total Bilirubin 0.5 0.3 - 1.2 mg/dL   GFR calc non Af Amer 62 (  L) >90 mL/min   GFR calc Af Amer 71 (L) >90 mL/min   Anion gap 7 5 - 15  Troponin I     Status: None   Collection Time: 09/02/14 10:03 PM  Result Value Ref Range   Troponin I <0.03 <0.031 ng/mL  Brain natriuretic peptide     Status: Abnormal   Collection Time: 09/02/14 10:07 PM  Result Value Ref Range   B Natriuretic Peptide 357.0 (H) 0.0 - 100.0 pg/mL  Comprehensive metabolic panel     Status: Abnormal   Collection Time: 09/03/14  6:11 AM  Result Value Ref Range   Sodium 126 (L) 135 - 145 mmol/L   Potassium 3.8 3.5 - 5.1 mmol/L   Chloride 94 (L) 96 - 112 mEq/L   CO2 25 19 - 32 mmol/L   Glucose, Bld 97 70 - 99 mg/dL   BUN 23 6 - 23 mg/dL   Creatinine, Ser 0.83 0.50 - 1.35 mg/dL   Calcium 8.7 8.4 - 10.5 mg/dL   Total Protein 6.7 6.0 - 8.3 g/dL   Albumin 3.8 3.5 - 5.2 g/dL   AST 21 0 - 37 U/L   ALT 17 0 - 53 U/L   Alkaline Phosphatase 75 39 - 117 U/L   Total Bilirubin 0.4 0.3 - 1.2 mg/dL   GFR calc non Af Amer 76 (L) >90 mL/min   GFR calc Af Amer 88 (L) >90 mL/min   Anion gap 7 5 - 15  CBC WITH DIFFERENTIAL     Status: Abnormal   Collection Time: 09/03/14  6:11 AM  Result Value Ref Range   WBC 6.9 4.0 - 10.5 K/uL   RBC 3.30 (L) 4.22 - 5.81 MIL/uL   Hemoglobin 11.0 (L) 13.0 - 17.0  g/dL   HCT 31.8 (L) 39.0 - 52.0 %   MCV 96.4 78.0 - 100.0 fL   MCH 33.3 26.0 - 34.0 pg   MCHC 34.6 30.0 - 36.0 g/dL   RDW 12.7 11.5 - 15.5 %   Platelets 239 150 - 400 K/uL   Neutrophils Relative % 65 43 - 77 %   Neutro Abs 4.6 1.7 - 7.7 K/uL   Lymphocytes Relative 14 12 - 46 %   Lymphs Abs 0.9 0.7 - 4.0 K/uL   Monocytes Relative 12 3 - 12 %   Monocytes Absolute 0.8 0.1 - 1.0 K/uL   Eosinophils Relative 8 (H) 0 - 5 %   Eosinophils Absolute 0.5 0.0 - 0.7 K/uL   Basophils Relative 1 0 - 1 %   Basophils Absolute 0.1 0.0 - 0.1 K/uL     ABGS No results for input(s): PHART, PO2ART, TCO2, HCO3 in the last 72 hours.  Invalid input(s): PCO2 CULTURES No results found for this or any previous visit (from the past 240 hour(s)). Studies/Results: Dg Chest 2 View  09/02/2014   CLINICAL DATA:  Shortness of breath and fatigue 4 2 hr  EXAM: CHEST  2 VIEW  COMPARISON:  08/31/2014  FINDINGS: Cardiac shadow is at the upper limits of normal in size. A pacing device is again seen and stable. Elevation of left hemidiaphragm is again seen. No focal infiltrate or sizable effusion is noted. Mild hyperinflation consistent with COPD is seen. Mild interstitial changes are noted without focal confluent infiltrate.  IMPRESSION: No acute abnormality noted.   Electronically Signed   By: Inez Catalina M.D.   On: 09/02/2014 21:37   Ct Head Wo Contrast  09/02/2014   CLINICAL DATA:  Shortness of  breath, fatigue, headache, dizziness.  EXAM: CT HEAD WITHOUT CONTRAST  TECHNIQUE: Contiguous axial images were obtained from the base of the skull through the vertex without intravenous contrast.  COMPARISON:  04/13/2012  FINDINGS: There is atrophy and chronic small vessel disease changes. 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 No acute infarct. No hemorrhage or hydrocephalus. No acute calvarial abnormality.  Mucosal thickening throughout the ethmoid air cells. Mastoid air cells are clear.  IMPRESSION: No acute intracranial  abnormality.   Electronically Signed   By: Rolm Baptise M.D.   On: 09/02/2014 21:50   Micro Results: No results found for this or any previous visit (from the past 240 hour(s)). Studies/Results: Dg Chest 2 View  09/02/2014   CLINICAL DATA:  Shortness of breath and fatigue 4 2 hr  EXAM: CHEST  2 VIEW  COMPARISON:  08/31/2014  FINDINGS: Cardiac shadow is at the upper limits of normal in size. A pacing device is again seen and stable. Elevation of left hemidiaphragm is again seen. No focal infiltrate or sizable effusion is noted. Mild hyperinflation consistent with COPD is seen. Mild interstitial changes are noted without focal confluent infiltrate.  IMPRESSION: No acute abnormality noted.   Electronically Signed   By: Inez Catalina M.D.   On: 09/02/2014 21:37   Ct Head Wo Contrast  09/02/2014   CLINICAL DATA:  Shortness of breath, fatigue, headache, dizziness.  EXAM: CT HEAD WITHOUT CONTRAST  TECHNIQUE: Contiguous axial images were obtained from the base of the skull through the vertex without intravenous contrast.  COMPARISON:  04/13/2012  FINDINGS: There is atrophy and chronic small vessel disease changes. 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 No acute infarct. No hemorrhage or hydrocephalus. No acute calvarial abnormality.  Mucosal thickening throughout the ethmoid air cells. Mastoid air cells are clear.  IMPRESSION: No acute intracranial abnormality.   Electronically Signed   By: Rolm Baptise M.D.   On: 09/02/2014 21:50   Medications:  I have reviewed the patient's current medications Scheduled Meds: . sodium chloride   Intravenous STAT  . aspirin EC  81 mg Oral Daily  . finasteride  5 mg Oral QHS  . heparin  5,000 Units Subcutaneous 3 times per day  . levothyroxine  175 mcg Oral QPM  . pantoprazole  40 mg Oral Daily  . sodium chloride  3 mL Intravenous Q12H  . tamsulosin  0.4 mg Oral BID  . vitamin C  500 mg Oral Daily   Continuous Infusions: . sodium chloride 75 mL/hr at 09/03/14  0126   PRN Meds:.HYDROcodone-acetaminophen   Assessment/Plan: #1. Hyponatremia. Decrease saline rate. Continue holding Lasix. #2. Chronic systolic heart failure. EF 25%. #3. Status post pacemaker placement. He is paced on telemetry at 60 bpm now. #4. Mild aortic stenosis. #5. Normocytic anemia. Stable. Active Problems:   Hyponatremia     LOS: 1 day   Andre Holder 09/03/2014, 8:15 AM

## 2014-09-03 NOTE — H&P (Signed)
Hospitalist Admission History and Physical  Patient name: Andre Holder record number: [id number] Date of birth: [birth year] Age: 80 y.o. Gender: male  Primary Care Provider: Asencion Noble, MD  Chief Complaint: hyponatremia, fatigue  History of Present Illness:This is a 80 y.o. year old male with significant past medical history of chronic combined diastolic and systolic heart failure w/ EF 20-25% s/p cardiac pacemaker, chronic hyponatremia, hypothyroidism, laryngeal ca s/p laryngectomy  presenting with hyponatremia, fatigue. Pt was dicscharged from the hospital yesterday for CHF exacerbation. Was diuresed w/ IV lasix with noted weight change below. Also w/ chronic Na @ 129-130.Per report, when pt came home, he was complaining of headache and fatigue.  Presented back to [facility] Afebrile, HR 50s-60s, BP 70s-100s-mainly in 70s, satting 94% on RA. Na 125, Cr 1.04. Hgb 10.8. Trop neg x1. Head CT WNL. CXR no cardiomegaly or pulm vascular congestion. EKG w/ dually paced rhythm at HR 60. BNP 357 (1100 from last admission).     Wt Readings from Last 3 Encounters:  09/02/14 167 lb 3.2 oz (75.841 kg)  07/25/14 165 lb (74.844 kg)  07/17/14 170 lb (77.111 kg)     Assessment and Plan: Andre Holder is a 80 y.o. year old male presenting with hyponatremia, fatigue    Active Problems:   Hyponatremia   1- Hyponatremia  -Acute on chronic in setting of recent diuresis -baseline na around 130  -Hypovolemic clinically  -gently hydrate  -reassess   2- Fatigue  -likely secondary to over-diuresis  -Dry on exam -gently hydrate  -head CT WNL  -follow   3- Chronic combined systolic and diastolic heart failure -s/p pacemaker -dry on exam in setting of recent diuresis  -fairly narrow volume window -gently hydrate -trend weight -hold BB/ACEi given BP  -cards consult prn    FEN/GI: heart healthy diet. PPI  Prophylaxis: sub q heparin  Disposition: pending further evaluation  Code  Status:Full Code    Patient Active Problem List   Diagnosis Date Noted  . CHF exacerbation 08/31/2014  . Acute on chronic systolic CHF (congestive heart failure) 08/31/2014  . Chest pain 08/31/2014  . Dyspnea 08/31/2014  . CHF (congestive heart failure) 08/25/2013  . Syncope 10/09/2011  . Hyponatremia 10/09/2011  . GERD 06/17/2010  . WEIGHT LOSS, ABNORMAL 06/17/2010  . ANEMIA 04/08/2010  . SECOND DEGREE AV BLOCK, MOBITZ II 04/08/2010  . Hyperlipidemia 10/24/2009  . PACEMAKER, PERMANENT 09/20/2009  . CANCER, LARYNX 03/07/2009  . AORTIC STENOSIS, MILD 03/07/2009  . CEREBROVASCULAR DISEASE 03/07/2009  . PERIPHERAL VASCULAR DISEASE 03/07/2009  . Tobacco abuse, in remission 03/07/2009  . Hypothyroidism 12/27/2008  . OSTEOARTHRITIS 12/27/2008   Past Medical History: Past Medical History  Diagnosis Date  . Arteriosclerotic cardiovascular disease (ASCVD)     Nonobstructive; 09/2008 50% proximal and 40% mid LAD; 25% circumflex; 30% RCA; mild global LV dysfunction with EF of 45%. No aortic stenosis.  . Mild aortic stenosis     not documented at catheterization; verified by echo in 2011  . Peripheral vascular disease     With a 70% innominate artery stenosis and nonobstructive carotid stenosis  . Hypothyroidism   . Degenerative joint disease     s/p bilateral TKR  . Mobitz (type) II atrioventricular block     With bradycardia; Medtronic pacemaker implanted in 09/2008  . Tobacco abuse, in remission     Remote  . GERD (gastroesophageal reflux disease)   . Hyperlipidemia     Lipid profile in 04/2010:115, 98, 43, 52.  Marland Kitchen  Cancer of larynx     laryngectomy in 1988; postoperative radiation therapy  . Weight loss     50 pounds between 1991 and 2011  . 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 Adrenal hyperplasia     Stable on serial imaging  . Anemia     minimal in 2011 with hemoglobin of 12.2 and high normal MCV  . Borderline hypertension      Normal CMet in 2011  . Skin cancer     Past Surgical History: Past Surgical History  Procedure Laterality Date  . Laryngectomy  1988    S/P laryngectomy and radiation therapy  . Appendectomy  1973  . Knee arthroscopy      Left  . Total knee arthroplasty      Bilateral, 19 years ago  . Cataract extraction, bilateral    . Decompression facial nerve      Right median  . Pacemaker insertion    . Insert / replace / remove pacemaker      Social History: History   Social History  . Marital Status: Married    Spouse Name: N/A    Number of Children: N/A  . Years of Education: N/A   Occupational History  . Retired from [employer]    Social History Main Topics  . Smoking status: Former Smoker    Types: Cigarettes  . Smokeless tobacco: Former Systems developer    Quit date: 09/08/1982     Comment: Quit 30 years  . Alcohol Use: No  . Drug Use: No  . Sexual Activity: Not Currently   Other Topics Concern  . None   Social History Narrative   Lives in [place] with spouse of 69 years    Family History: Family History  Problem Relation Age of Onset  . Stroke Mother   . Leukemia Father   . Colon cancer Neg Hx   . Liver disease Neg Hx   . GI problems Neg Hx   . Stroke Other   . Diabetes Other     Allergies: No Known Allergies  Current Facility-Administered Medications  Medication Dose Route Frequency Provider Last Rate Last Dose  . 0.9 %  sodium chloride infusion   Intravenous STAT Ezequiel Essex, MD      . 0.9 %  sodium chloride infusion   Intravenous Continuous Shanda Howells, MD      . heparin injection 5,000 Units  5,000 Units Subcutaneous 3 times per day Shanda Howells, MD      . sodium chloride 0.9 % injection 3 mL  3 mL Intravenous Q12H Shanda Howells, MD       Current Outpatient Prescriptions  Medication Sig Dispense Refill  . aspirin EC 81 MG tablet Take 81 mg by mouth daily.    . B Complex-C (B-COMPLEX WITH VITAMIN C) tablet Take 1 tablet by mouth daily.    Marland Kitchen  docusate sodium (COLACE) 100 MG capsule Take 100 mg by mouth 2 (two) times daily.    . finasteride (PROSCAR) 5 MG tablet Take 5 mg by mouth at bedtime.    . Fish Oil-Cholecalciferol (FISH OIL + D3) 1200-1000 MG-UNIT CAPS Take 1 capsule by mouth daily.    Marland Kitchen HYDROcodone-acetaminophen (NORCO/VICODIN) 5-325 MG per tablet Take 1 tablet by mouth every 8 (eight) hours as needed for moderate pain or severe pain.     Marland Kitchen levothyroxine (SYNTHROID, LEVOTHROID) 175 MCG tablet Take 175 mcg by mouth every evening.     Marland Kitchen  lisinopril (PRINIVIL,ZESTRIL) 10 MG tablet Take 1 tablet (10 mg total) by mouth daily. 30 tablet 12  . magnesium oxide (MAG-OX) 400 MG tablet Take 400 mg by mouth 2 (two) times daily.    . methocarbamol (ROBAXIN) 750 MG tablet Take 750 mg by mouth 2 (two) times daily.    . metoprolol tartrate (LOPRESSOR) 25 MG tablet Take 1 tablet (25 mg total) by mouth 2 (two) times daily. 60 tablet 12  . omeprazole (PRILOSEC) 20 MG capsule Take 20 mg by mouth daily.    . Tamsulosin HCl (FLOMAX) 0.4 MG CAPS Take 0.4 mg by mouth 2 (two) times daily.     . vitamin C (ASCORBIC ACID) 500 MG tablet Take 500 mg by mouth daily.     Review Of Systems: 12 point ROS negative except as noted above in HPI.  Physical Exam: Filed Vitals:   09/03/14 0030  BP: 74/53  Pulse:   Temp:   Resp: 13    General: cooperative and sleeping, but arousable  HEENT: PERRLA, extra ocular movement intact and s/p tracheostomy  Heart: S1, S2 normal, no murmur, rub or gallop, regular rate and rhythm Lungs: clear to auscultation, no wheezes or rales and unlabored breathing Abdomen: abdomen is soft without significant tenderness, masses, organomegaly or guarding Extremities: extremities normal, atraumatic, no cyanosis or edema Skin:no rashes, no ecchymoses Neurology: normal without focal findings  Labs and Imaging: Lab Results  Component Value Date/Time   NA 125* 09/02/2014 10:03 PM   K 3.6 09/02/2014 10:03 PM   CL 93* 09/02/2014  10:03 PM   CO2 25 09/02/2014 10:03 PM   BUN 26* 09/02/2014 10:03 PM   CREATININE 1.04 09/02/2014 10:03 PM   GLUCOSE 110* 09/02/2014 10:03 PM   Lab Results  Component Value Date   WBC 7.0 09/02/2014   HGB 10.9* 09/02/2014   HCT 31.1* 09/02/2014   MCV 96.0 09/02/2014   PLT 229 09/02/2014    Dg Chest 2 View  09/02/2014   CLINICAL DATA:  Shortness of breath and fatigue 4 2 hr  EXAM: CHEST  2 VIEW  COMPARISON:  08/31/2014  FINDINGS: Cardiac shadow is at the upper limits of normal in size. A pacing device is again seen and stable. Elevation of left hemidiaphragm is again seen. No focal infiltrate or sizable effusion is noted. Mild hyperinflation consistent with COPD is seen. Mild interstitial changes are noted without focal confluent infiltrate.  IMPRESSION: No acute abnormality noted.   Electronically Signed   By: Inez Catalina M.D.   On: 09/02/2014 21:37   Ct Head Wo Contrast  09/02/2014   CLINICAL DATA:  Shortness of breath, fatigue, headache, dizziness.  EXAM: CT HEAD WITHOUT CONTRAST  TECHNIQUE: Contiguous axial images were obtained from the base of the skull through the vertex without intravenous contrast.  COMPARISON:  04/13/2012  FINDINGS: There is atrophy and chronic small vessel disease changes. 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 No acute infarct. No hemorrhage or hydrocephalus. No acute calvarial abnormality.  Mucosal thickening throughout the ethmoid air cells. Mastoid air cells are clear.  IMPRESSION: No acute intracranial abnormality.   Electronically Signed   By: Rolm Baptise M.D.   On: 09/02/2014 21:50           Shanda Howells MD  Pager: [phone]

## 2014-09-03 NOTE — Progress Notes (Signed)
Notified by telemetry that pt experienced a 10 beat run of Vtach. MD notified. Will continuee to monitor

## 2014-09-04 DIAGNOSIS — I5022 Chronic systolic (congestive) heart failure: Secondary | ICD-10-CM

## 2014-09-04 DIAGNOSIS — E871 Hypo-osmolality and hyponatremia: Principal | ICD-10-CM

## 2014-09-04 DIAGNOSIS — I350 Nonrheumatic aortic (valve) stenosis: Secondary | ICD-10-CM

## 2014-09-04 LAB — BASIC METABOLIC PANEL
ANION GAP: 4 — AB (ref 5–15)
BUN: 14 mg/dL (ref 6–23)
CHLORIDE: 97 meq/L (ref 96–112)
CO2: 25 mmol/L (ref 19–32)
Calcium: 8.5 mg/dL (ref 8.4–10.5)
Creatinine, Ser: 0.62 mg/dL (ref 0.50–1.35)
GFR calc non Af Amer: 85 mL/min — ABNORMAL LOW (ref >90)
Glucose, Bld: 104 mg/dL — ABNORMAL HIGH (ref 70–99)
POTASSIUM: 4.4 mmol/L (ref 3.5–5.1)
SODIUM: 126 mmol/L — AB (ref 135–145)

## 2014-09-04 MED ORDER — TRAZODONE HCL 50 MG PO TABS
25.0000 mg | ORAL_TABLET | Freq: Every day | ORAL | Status: DC
  Administered 2014-09-04: 25 mg via ORAL
  Filled 2014-09-04: qty 1

## 2014-09-04 NOTE — Progress Notes (Signed)
Subjective: Mr. Yost has had systolic blood pressures from 82-114. He is not receiving metoprolol, lisinopril or Lasix for now. He had a 10 beat run of V. tach. He has a pacemaker in place.  Objective: Vital signs in last 24 hours: Filed Vitals:   09/03/14 0624 09/03/14 1421 09/03/14 2124 09/04/14 0549  BP: 102/58 89/51 114/57 82/45  Pulse: 72 66 65 62  Temp: 98.1 F (36.7 C) 98.7 F (37.1 C) 98.6 F (37 C) 98.6 F (37 C)  TempSrc: Oral Oral Oral Oral  Resp: 14 18 18 18   Height:      Weight:    167 lb (75.751 kg)  SpO2: 98% 97% 98% 95%   Weight change: -5 lb 9.9 oz (-2.549 kg)  Intake/Output Summary (Last 24 hours) at 09/04/14 0738 Last data filed at 09/04/14 0600  Gross per 24 hour  Intake 1236.25 ml  Output    675 ml  Net 561.25 ml    Physical Exam: Alert. Breathing comfortably. Lungs clear. Heart regular with a grade 2 systolic murmur. Abdomen is nontender. Extremities reveal no edema.  Lab Results:   No results found for this or any previous visit (from the past 24 hour(s)).   ABGS No results for input(s): PHART, PO2ART, TCO2, HCO3 in the last 72 hours.  Invalid input(s): PCO2 CULTURES No results found for this or any previous visit (from the past 240 hour(s)). Studies/Results: Dg Chest 2 View  09/02/2014   CLINICAL DATA:  Shortness of breath and fatigue 4 2 hr  EXAM: CHEST  2 VIEW  COMPARISON:  08/31/2014  FINDINGS: Cardiac shadow is at the upper limits of normal in size. A pacing device is again seen and stable. Elevation of left hemidiaphragm is again seen. No focal infiltrate or sizable effusion is noted. Mild hyperinflation consistent with COPD is seen. Mild interstitial changes are noted without focal confluent infiltrate.  IMPRESSION: No acute abnormality noted.   Electronically Signed   By: Inez Catalina M.D.   On: 09/02/2014 21:37   Ct Head Wo Contrast  09/02/2014   CLINICAL DATA:  Shortness of breath, fatigue, headache, dizziness.  EXAM: CT HEAD  WITHOUT CONTRAST  TECHNIQUE: Contiguous axial images were obtained from the base of the skull through the vertex without intravenous contrast.  COMPARISON:  04/13/2012  FINDINGS: There is atrophy and chronic small vessel disease changes. 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 No acute infarct. No hemorrhage or hydrocephalus. No acute calvarial abnormality.  Mucosal thickening throughout the ethmoid air cells. Mastoid air cells are clear.  IMPRESSION: No acute intracranial abnormality.   Electronically Signed   By: Rolm Baptise M.D.   On: 09/02/2014 21:50   Micro Results: No results found for this or any previous visit (from the past 240 hour(s)). Studies/Results: Dg Chest 2 View  09/02/2014   CLINICAL DATA:  Shortness of breath and fatigue 4 2 hr  EXAM: CHEST  2 VIEW  COMPARISON:  08/31/2014  FINDINGS: Cardiac shadow is at the upper limits of normal in size. A pacing device is again seen and stable. Elevation of left hemidiaphragm is again seen. No focal infiltrate or sizable effusion is noted. Mild hyperinflation consistent with COPD is seen. Mild interstitial changes are noted without focal confluent infiltrate.  IMPRESSION: No acute abnormality noted.   Electronically Signed   By: Inez Catalina M.D.   On: 09/02/2014 21:37   Ct Head Wo Contrast  09/02/2014   CLINICAL DATA:  Shortness of breath, fatigue, headache, dizziness.  EXAM: CT HEAD WITHOUT CONTRAST  TECHNIQUE: Contiguous axial images were obtained from the base of the skull through the vertex without intravenous contrast.  COMPARISON:  04/13/2012  FINDINGS: There is atrophy and chronic small vessel disease changes. 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 No acute infarct. No hemorrhage or hydrocephalus. No acute calvarial abnormality.  Mucosal thickening throughout the ethmoid air cells. Mastoid air cells are clear.  IMPRESSION: No acute intracranial abnormality.   Electronically Signed   By: Rolm Baptise M.D.   On: 09/02/2014 21:50    Medications:  I have reviewed the patient's current medications Scheduled Meds: . aspirin EC  81 mg Oral Daily  . finasteride  5 mg Oral QHS  . heparin  5,000 Units Subcutaneous 3 times per day  . levothyroxine  175 mcg Oral QPM  . pantoprazole  40 mg Oral Daily  . sodium chloride  3 mL Intravenous Q12H  . tamsulosin  0.4 mg Oral BID  . vitamin C  500 mg Oral Daily   Continuous Infusions: . sodium chloride 50 mL/hr at 09/03/14 1857   PRN Meds:.HYDROcodone-acetaminophen   Assessment/Plan: #1. Chronic systolic heart failure. Continue holding Lasix today with his low blood pressure. Consult cardiology regarding his nonsustained V. tach. #2. Hyponatremia. Repeat metabolic profile pending. Active Problems:   Hyponatremia     LOS: 2 days   Kaliope Quinonez 09/04/2014, 7:38 AM

## 2014-09-04 NOTE — Consult Note (Signed)
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 He presents to ER with fatigue and hyponatremia  Lasix was held  Yesterday on telemetry he had 10 beat NSVT  Meds held (metoprolol, lisinopril and lasix) Patinet still weak.  Says he still has difficulty breathing   No change than when left hospitall  Is lying flat  Denies palpitations.         Past Medical History  Diagnosis Date  . Arteriosclerotic cardiovascular disease (ASCVD)     Nonobstructive; 09/2008 50% proximal and 40% mid LAD; 25% circumflex; 30% RCA; mild global LV dysfunction with EF of 45%. No aortic stenosis.  . Mild aortic stenosis     not documented at catheterization; verified by echo in 2011  . Peripheral vascular disease     With a 70% innominate artery stenosis and nonobstructive carotid stenosis  . Hypothyroidism   . Degenerative joint disease     s/p bilateral TKR  . Mobitz (type) II atrioventricular block     With bradycardia; Medtronic pacemaker implanted in 09/2008  . Tobacco abuse, in remission     Remote  . GERD (gastroesophageal reflux disease)   . Hyperlipidemia     Lipid profile in 04/2010:115, 98, 43, 52.  . Cancer of larynx     laryngectomy in 1988; postoperative radiation therapy  . Weight loss     50 pounds between 1991 and 2011  . 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 Adrenal hyperplasia     Stable on serial imaging  . Anemia     minimal in 2011 with hemoglobin of 12.2 and high normal MCV  . Borderline hypertension     Normal CMet in 2011  . Skin cancer     Medications Prior to Admission  Medication Sig Dispense Refill  . aspirin EC 81 MG tablet Take 81 mg by mouth daily.    . B Complex-C (B-COMPLEX WITH VITAMIN  C) tablet Take 1 tablet by mouth daily.    Marland Kitchen docusate sodium (COLACE) 100 MG capsule Take 100 mg by mouth 2 (two) times daily.    . finasteride (PROSCAR) 5 MG tablet Take 5 mg by mouth at bedtime.    . Fish Oil-Cholecalciferol (FISH OIL + D3) 1200-1000 MG-UNIT CAPS Take 1 capsule by mouth daily.    Marland Kitchen HYDROcodone-acetaminophen (NORCO/VICODIN) 5-325 MG per tablet Take 1 tablet by mouth every 8 (eight) hours as needed for moderate pain or severe pain.     Marland Kitchen levothyroxine (SYNTHROID, LEVOTHROID) 175 MCG tablet Take 175 mcg by mouth every evening.     Marland Kitchen lisinopril (PRINIVIL,ZESTRIL) 10 MG tablet Take 1 tablet (10 mg total) by mouth daily. 30 tablet 12  . magnesium oxide (MAG-OX) 400 MG tablet Take 400 mg by mouth 2 (two) times daily.    . methocarbamol (ROBAXIN) 750 MG tablet Take 750 mg by mouth 2 (two) times daily.    . metoprolol tartrate (LOPRESSOR) 25 MG tablet Take 1 tablet (25 mg total) by mouth 2 (two) times daily. 60 tablet 12  . omeprazole (PRILOSEC) 20 MG capsule Take 20 mg by mouth daily.    . Tamsulosin HCl (FLOMAX) 0.4 MG CAPS Take 0.4 mg by mouth  2 (two) times daily.     . vitamin C (ASCORBIC ACID) 500 MG tablet Take 500 mg by mouth daily.       Marland Kitchen aspirin EC  81 mg Oral Daily  . finasteride  5 mg Oral QHS  . heparin  5,000 Units Subcutaneous 3 times per day  . levothyroxine  175 mcg Oral QPM  . pantoprazole  40 mg Oral Daily  . sodium chloride  3 mL Intravenous Q12H  . tamsulosin  0.4 mg Oral BID  . vitamin C  500 mg Oral Daily    Infusions: . sodium chloride 50 mL/hr at 09/04/14 1449    No Known Allergies  History   Social History  . Marital Status: Married    Spouse Name: N/A    Number of Children: N/A  . Years of Education: N/A   Occupational History  . Retired from [employer]    Social History Main Topics  . Smoking status: Former Smoker    Types: Cigarettes  . Smokeless tobacco: Former Systems developer    Quit date: 09/08/1982     Comment: Quit 30 years  .  Alcohol Use: No  . Drug Use: No  . Sexual Activity: Not Currently   Other Topics Concern  . Not on file   Social History Narrative   Lives in [place] with spouse of 43 years    Family History  Problem Relation Age of Onset  . Stroke Mother   . Leukemia Father   . Colon cancer Neg Hx   . Liver disease Neg Hx   . GI problems Neg Hx   . Stroke Other   . Diabetes Other     REVIEW OF SYSTEMS:  All systems reviewed  Negative to the above problem except as noted above.    PHYSICAL EXAM: Filed Vitals:   09/04/14 1300  BP: 95/58  Pulse: 73  Temp: 98.1 F (36.7 C)  Resp: 18     Intake/Output Summary (Last 24 hours) at 09/04/14 1550 Last data filed at 09/04/14 0600  Gross per 24 hour  Intake    790 ml  Output    500 ml  Net    290 ml    General:  Thin  No respiratory difficulty HEENT: normal Neck: . no JVD. Carotids 2+ bilat; no bruits.  S/p laryngectomy   Cor: PMI nondisplaced. Regular rate & rhythm. No rubs, gallops Gri II/VI systolic murmur LSB   Lungs: Rhonchi bilaterally  No rales   Abdomen: soft, nontender, nondistended. No hepatosplenomegaly. No bruits or masses. Good bowel sounds. Extremities: no cyanosis, clubbing, rash, edema Neuro: alert & oriented x 3, cranial nerves grossly intact. moves all 4 extremities w/o difficulty. Affect pleasant.  ECG:  12/26  AV sequential PM  Tele:  SR  Paced at times.  NSVT  Longest 10 beats    Results for orders placed or performed during the hospital encounter of 09/02/14 (from the past 24 hour(s))  Basic metabolic panel     Status: Abnormal   Collection Time: 09/04/14  7:53 AM  Result Value Ref Range   Sodium 126 (L) 135 - 145 mmol/L   Potassium 4.4 3.5 - 5.1 mmol/L   Chloride 97 96 - 112 mEq/L   CO2 25 19 - 32 mmol/L   Glucose, Bld 104 (H) 70 - 99 mg/dL   BUN 14 6 - 23 mg/dL   Creatinine, Ser 0.62 0.50 - 1.35 mg/dL   Calcium 8.5 8.4 - 10.5 mg/dL   GFR  calc non Af Amer 85 (L) >90 mL/min   GFR calc Af Amer >90 >90  mL/min   Anion gap 4 (L) 5 - 15   Dg Chest 2 View  09/02/2014   CLINICAL DATA:  Shortness of breath and fatigue 4 2 hr  EXAM: CHEST  2 VIEW  COMPARISON:  08/31/2014  FINDINGS: Cardiac shadow is at the upper limits of normal in size. A pacing device is again seen and stable. Elevation of left hemidiaphragm is again seen. No focal infiltrate or sizable effusion is noted. Mild hyperinflation consistent with COPD is seen. Mild interstitial changes are noted without focal confluent infiltrate.  IMPRESSION: No acute abnormality noted.   Electronically Signed   By: Inez Catalina M.D.   On: 09/02/2014 21:37   Ct Head Wo Contrast  09/02/2014   CLINICAL DATA:  Shortness of breath, fatigue, headache, dizziness.  EXAM: CT HEAD WITHOUT CONTRAST  TECHNIQUE: Contiguous axial images were obtained from the base of the skull through the vertex without intravenous contrast.  COMPARISON:  04/13/2012  FINDINGS: There is atrophy and chronic small vessel disease changes. 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 No acute infarct. No hemorrhage or hydrocephalus. No acute calvarial abnormality.  Mucosal thickening throughout the ethmoid air cells. Mastoid air cells are clear.  IMPRESSION: No acute intracranial abnormality.   Electronically Signed   By: Rolm Baptise M.D.   On: 09/02/2014 21:50     ASSESSMENT:  Patient is an 80 yo with mild CAD, nonischemic cardiomyopathy  Admitted last week with volume overload  Represented to ER with weakness less than 24 hours after admit Still with difficulty breathing he says and weak   Tele with PVCs, NSVT  Longest 10 beats.    Agree with gentle hydration and holding meds. With severe LV dysfunction NSVT not unexpected.  B Blocker currently on hold  When BP improves would add low dose.  Follow  WOuld not recomm antiarrhythmic Rx at present  Not a candidate for invasive Rx.    2 . CAD  I am not convinced of active ischemia  Follow  3.  Mild aortic stenosis.    4.   Hyponatremia.  Follow with replacement.

## 2014-09-04 NOTE — Care Management Note (Signed)
    Page 1 of 1   09/04/2014     4:36:43 PM CARE MANAGEMENT NOTE 09/04/2014  Patient:  Andre Holder, Andre Holder   Account Number:  [id number]  Date Initiated:  09/04/2014  Documentation initiated by:  Jolene Provost  Subjective/Objective Assessment:   Pt is from home, lives with family. Pt is nonverbal and uses a voice box. Pt is independent at home, uses a walker and has no HH services. Attempted to contact family but no one answered and no voicemail picked up.     Action/Plan:   Plan for pt to return home with current set up. Will continue to follow for CM needs.   Anticipated DC Date:  09/06/2014   Anticipated DC Plan:  [place]  CM consult      Choice offered to / List presented to:             Status of service:  In process, will continue to follow Medicare Important Message given?   (If response is "NO", the following Medicare IM given date fields will be blank) Date Medicare IM given:   Medicare IM given by:   Date Additional Medicare IM given:   Additional Medicare IM given by:    Discharge Disposition:  HOME/SELF CARE  Per UR Regulation:    If discussed at Long Length of Stay Meetings, dates discussed:    Comments:  09/04/2014 [place], RN, MSN, [employer]

## 2014-09-05 LAB — BASIC METABOLIC PANEL
ANION GAP: 5 (ref 5–15)
BUN: 13 mg/dL (ref 6–23)
CHLORIDE: 99 meq/L (ref 96–112)
CO2: 24 mmol/L (ref 19–32)
Calcium: 8.5 mg/dL (ref 8.4–10.5)
Creatinine, Ser: 0.61 mg/dL (ref 0.50–1.35)
GFR calc non Af Amer: 86 mL/min — ABNORMAL LOW (ref >90)
Glucose, Bld: 91 mg/dL (ref 70–99)
POTASSIUM: 4.4 mmol/L (ref 3.5–5.1)
SODIUM: 128 mmol/L — AB (ref 135–145)

## 2014-09-05 MED ORDER — FUROSEMIDE 40 MG PO TABS: 40.0000 mg | ORAL_TABLET | Freq: Every day | ORAL | Status: DC

## 2014-09-05 NOTE — Care Management Utilization Note (Signed)
UR complete 

## 2014-09-05 NOTE — Progress Notes (Signed)
Patient and family states understanding of discharge instructions 

## 2014-09-05 NOTE — Discharge Summary (Signed)
Physician Discharge Summary  Andre Holder LFY:[id number] DOB: [birth year] DOA: 09/02/2014   Admit date: 09/02/2014 Discharge date: 09/05/2014  Discharge Diagnoses: #1. Hypotension. #2. Hyponatremia. #3. Chronic systolic congestive heart failure. #4. Hypothyroidism. #5. Laryngeal carcinoma. #6. Status post pacemaker placement. #7. Nonsustained ventricular tachycardia. Active Problems:   Hyponatremia    Wt Readings from Last 3 Encounters:  09/05/14 165 lb 3.2 oz (74.934 kg)  09/02/14 167 lb 3.2 oz (75.841 kg)  07/25/14 165 lb (74.844 kg)     Hospital Course:  This patient is an 80 year old male who presented with weakness and hyponatremia at 125. He had been discharged earlier in the day after treatment of chest pain and congestive heart failure. He was hypotensive initially. He was treated with IV normal saline. His sodium level improved to 128 and his blood pressure normalized. Metoprolol and lisinopril were held as these had been started during his last admission. Lasix was held. He had an episode of ventricular tachycardia of 10 beats duration. He was seen in consultation by cardiology.  His general condition improved and he was stable for discharge on the morning of December 29. He will be seen in follow-up in my office in one week. His discharge exam reveals clear lung fields and no peripheral edema. His heart rhythm is paced. Lasix will be restarted at 40 mg daily.   Discharge Instructions     Medication List    STOP taking these medications        lisinopril 10 MG tablet  Commonly known as:  PRINIVIL,ZESTRIL     metoprolol tartrate 25 MG tablet  Commonly known as:  LOPRESSOR      TAKE these medications        aspirin EC 81 MG tablet  Take 81 mg by mouth daily.     B-complex with vitamin C tablet  Take 1 tablet by mouth daily.     docusate sodium 100 MG capsule  Commonly known as:  COLACE  Take 100 mg by mouth 2 (two) times daily.     finasteride 5 MG  tablet  Commonly known as:  PROSCAR  Take 5 mg by mouth at bedtime.     FISH OIL + D3 1200-1000 MG-UNIT Caps  Take 1 capsule by mouth daily.     furosemide 40 MG tablet  Commonly known as:  LASIX  Take 1 tablet (40 mg total) by mouth daily.     HYDROcodone-acetaminophen 5-325 MG per tablet  Commonly known as:  NORCO/VICODIN  Take 1 tablet by mouth every 8 (eight) hours as needed for moderate pain or severe pain.     levothyroxine 175 MCG tablet  Commonly known as:  SYNTHROID, LEVOTHROID  Take 175 mcg by mouth every evening.     magnesium oxide 400 MG tablet  Commonly known as:  MAG-OX  Take 400 mg by mouth 2 (two) times daily.     methocarbamol 750 MG tablet  Commonly known as:  ROBAXIN  Take 750 mg by mouth 2 (two) times daily.     omeprazole 20 MG capsule  Commonly known as:  PRILOSEC  Take 20 mg by mouth daily.     tamsulosin 0.4 MG Caps capsule  Commonly known as:  FLOMAX  Take 0.4 mg by mouth 2 (two) times daily.     vitamin C 500 MG tablet  Commonly known as:  ASCORBIC ACID  Take 500 mg by mouth daily.         Kemiyah Tarazon 09/05/2014

## 2014-12-23 ENCOUNTER — Emergency Department (HOSPITAL_COMMUNITY): Payer: Non-veteran care

## 2014-12-23 ENCOUNTER — Encounter (HOSPITAL_COMMUNITY): Payer: Self-pay | Admitting: Cardiology

## 2014-12-23 ENCOUNTER — Inpatient Hospital Stay (HOSPITAL_COMMUNITY)
Admission: EM | Admit: 2014-12-23 | Discharge: 2014-12-26 | DRG: 389 | Disposition: A | Discharge status: Home / Self Care | Payer: Non-veteran care | Attending: Internal Medicine | Admitting: Internal Medicine

## 2014-12-23 DIAGNOSIS — Z930 Tracheostomy status: Secondary | ICD-10-CM | POA: Diagnosis not present

## 2014-12-23 DIAGNOSIS — I5042 Chronic combined systolic (congestive) and diastolic (congestive) heart failure: Secondary | ICD-10-CM | POA: Diagnosis present

## 2014-12-23 DIAGNOSIS — I350 Nonrheumatic aortic (valve) stenosis: Secondary | ICD-10-CM | POA: Diagnosis present

## 2014-12-23 DIAGNOSIS — E871 Hypo-osmolality and hyponatremia: Secondary | ICD-10-CM

## 2014-12-23 DIAGNOSIS — R030 Elevated blood-pressure reading, without diagnosis of hypertension: Secondary | ICD-10-CM | POA: Diagnosis present

## 2014-12-23 DIAGNOSIS — E785 Hyperlipidemia, unspecified: Secondary | ICD-10-CM | POA: Diagnosis present

## 2014-12-23 DIAGNOSIS — I359 Nonrheumatic aortic valve disorder, unspecified: Secondary | ICD-10-CM

## 2014-12-23 DIAGNOSIS — E039 Hypothyroidism, unspecified: Secondary | ICD-10-CM | POA: Diagnosis present

## 2014-12-23 DIAGNOSIS — I441 Atrioventricular block, second degree: Secondary | ICD-10-CM

## 2014-12-23 DIAGNOSIS — E278 Other specified disorders of adrenal gland: Secondary | ICD-10-CM | POA: Diagnosis present

## 2014-12-23 DIAGNOSIS — K5669 Other intestinal obstruction: Secondary | ICD-10-CM | POA: Diagnosis not present

## 2014-12-23 DIAGNOSIS — D649 Anemia, unspecified: Secondary | ICD-10-CM | POA: Diagnosis present

## 2014-12-23 DIAGNOSIS — I5022 Chronic systolic (congestive) heart failure: Secondary | ICD-10-CM

## 2014-12-23 DIAGNOSIS — Z950 Presence of cardiac pacemaker: Secondary | ICD-10-CM | POA: Diagnosis not present

## 2014-12-23 DIAGNOSIS — M1990 Unspecified osteoarthritis, unspecified site: Secondary | ICD-10-CM | POA: Diagnosis present

## 2014-12-23 DIAGNOSIS — Z85828 Personal history of other malignant neoplasm of skin: Secondary | ICD-10-CM | POA: Diagnosis not present

## 2014-12-23 DIAGNOSIS — Z87891 Personal history of nicotine dependence: Secondary | ICD-10-CM | POA: Diagnosis not present

## 2014-12-23 DIAGNOSIS — I2510 Atherosclerotic heart disease of native coronary artery without angina pectoris: Secondary | ICD-10-CM | POA: Diagnosis present

## 2014-12-23 DIAGNOSIS — I739 Peripheral vascular disease, unspecified: Secondary | ICD-10-CM | POA: Diagnosis present

## 2014-12-23 DIAGNOSIS — Z8521 Personal history of malignant neoplasm of larynx: Secondary | ICD-10-CM | POA: Diagnosis not present

## 2014-12-23 DIAGNOSIS — R109 Unspecified abdominal pain: Secondary | ICD-10-CM | POA: Diagnosis present

## 2014-12-23 DIAGNOSIS — K5660 Unspecified intestinal obstruction: Principal | ICD-10-CM | POA: Diagnosis present

## 2014-12-23 DIAGNOSIS — K56609 Unspecified intestinal obstruction, unspecified as to partial versus complete obstruction: Secondary | ICD-10-CM | POA: Diagnosis present

## 2014-12-23 DIAGNOSIS — Z923 Personal history of irradiation: Secondary | ICD-10-CM

## 2014-12-23 DIAGNOSIS — K219 Gastro-esophageal reflux disease without esophagitis: Secondary | ICD-10-CM | POA: Diagnosis present

## 2014-12-23 LAB — CBC WITH DIFFERENTIAL/PLATELET
BASOS ABS: 0 10*3/uL (ref 0.0–0.1)
BASOS PCT: 0 % (ref 0–1)
Eosinophils Absolute: 0.3 10*3/uL (ref 0.0–0.7)
Eosinophils Relative: 4 % (ref 0–5)
HEMATOCRIT: 32.6 % — AB (ref 39.0–52.0)
Hemoglobin: 11.2 g/dL — ABNORMAL LOW (ref 13.0–17.0)
LYMPHS PCT: 14 % (ref 12–46)
Lymphs Abs: 1 10*3/uL (ref 0.7–4.0)
MCH: 33.8 pg (ref 26.0–34.0)
MCHC: 34.4 g/dL (ref 30.0–36.0)
MCV: 98.5 fL (ref 78.0–100.0)
Monocytes Absolute: 0.5 10*3/uL (ref 0.1–1.0)
Monocytes Relative: 7 % (ref 3–12)
NEUTROS PCT: 75 % (ref 43–77)
Neutro Abs: 5.1 10*3/uL (ref 1.7–7.7)
PLATELETS: 224 10*3/uL (ref 150–400)
RBC: 3.31 MIL/uL — AB (ref 4.22–5.81)
RDW: 12.9 % (ref 11.5–15.5)
WBC: 6.8 10*3/uL (ref 4.0–10.5)

## 2014-12-23 LAB — COMPREHENSIVE METABOLIC PANEL
ALBUMIN: 3.8 g/dL (ref 3.5–5.2)
ALT: 18 U/L (ref 0–53)
AST: 26 U/L (ref 0–37)
Alkaline Phosphatase: 88 U/L (ref 39–117)
Anion gap: 7 (ref 5–15)
BUN: 21 mg/dL (ref 6–23)
CHLORIDE: 96 mmol/L (ref 96–112)
CO2: 27 mmol/L (ref 19–32)
Calcium: 8.9 mg/dL (ref 8.4–10.5)
Creatinine, Ser: 0.75 mg/dL (ref 0.50–1.35)
GFR calc non Af Amer: 79 mL/min — ABNORMAL LOW (ref >90)
GLUCOSE: 118 mg/dL — AB (ref 70–99)
POTASSIUM: 4.5 mmol/L (ref 3.5–5.1)
SODIUM: 130 mmol/L — AB (ref 135–145)
Total Bilirubin: 0.7 mg/dL (ref 0.3–1.2)
Total Protein: 7 g/dL (ref 6.0–8.3)

## 2014-12-23 LAB — BRAIN NATRIURETIC PEPTIDE: B Natriuretic Peptide: 1166 pg/mL — ABNORMAL HIGH (ref 0.0–100.0)

## 2014-12-23 LAB — TROPONIN I

## 2014-12-23 LAB — LIPASE, BLOOD: LIPASE: 23 U/L (ref 11–59)

## 2014-12-23 MED ORDER — HEPARIN SODIUM (PORCINE) 5000 UNIT/ML IJ SOLN
5000.0000 [IU] | Freq: Three times a day (TID) | INTRAMUSCULAR | Status: DC
  Administered 2014-12-23 – 2014-12-26 (×8): 5000 [IU] via SUBCUTANEOUS
  Filled 2014-12-23 (×9): qty 1

## 2014-12-23 MED ORDER — LEVOTHYROXINE SODIUM 100 MCG IV SOLR
87.5000 ug | Freq: Every day | INTRAVENOUS | Status: DC
  Administered 2014-12-24: 87.5 ug via INTRAVENOUS
  Filled 2014-12-23 (×2): qty 5

## 2014-12-23 MED ORDER — ONDANSETRON HCL 4 MG/2ML IJ SOLN
4.0000 mg | Freq: Four times a day (QID) | INTRAMUSCULAR | Status: DC | PRN
  Filled 2014-12-23: qty 2

## 2014-12-23 MED ORDER — ONDANSETRON HCL 4 MG PO TABS: 4.0000 mg | ORAL_TABLET | Freq: Four times a day (QID) | ORAL | Status: DC | PRN

## 2014-12-23 MED ORDER — METHOCARBAMOL 500 MG PO TABS
750.0000 mg | ORAL_TABLET | Freq: Two times a day (BID) | ORAL | Status: DC
  Administered 2014-12-23 – 2014-12-24 (×2): 750 mg via ORAL
  Filled 2014-12-23 (×2): qty 2

## 2014-12-23 MED ORDER — IOHEXOL 300 MG/ML  SOLN
100.0000 mL | Freq: Once | INTRAMUSCULAR | Status: AC | PRN
  Administered 2014-12-23: 100 mL via INTRAVENOUS

## 2014-12-23 MED ORDER — MORPHINE SULFATE 2 MG/ML IJ SOLN
1.0000 mg | INTRAMUSCULAR | Status: DC | PRN
  Administered 2014-12-23 – 2014-12-25 (×4): 1 mg via INTRAVENOUS
  Filled 2014-12-23 (×4): qty 1

## 2014-12-23 MED ORDER — PANTOPRAZOLE SODIUM 40 MG IV SOLR
40.0000 mg | Freq: Every day | INTRAVENOUS | Status: DC
  Administered 2014-12-23 – 2014-12-24 (×2): 40 mg via INTRAVENOUS
  Filled 2014-12-23 (×2): qty 40

## 2014-12-23 MED ORDER — SODIUM CHLORIDE 0.9 % IV SOLN
INTRAVENOUS | Status: DC
  Administered 2014-12-23 – 2014-12-25 (×3): via INTRAVENOUS

## 2014-12-23 NOTE — ED Provider Notes (Signed)
CSN: [id number]     Arrival date & time 12/23/14  3235 History  This chart was scribed for Jola Schmidt, MD by Jeanell Sparrow, ED Scribe. This patient was seen in room APA02/APA02 and the patient's care was started at 10:28 AM.   Chief Complaint  Patient presents with  . Shortness of Breath  . Chest Pain   The history is provided by the patient and the spouse. No language interpreter was used.   HPI Comments: Andre Holder is a 81 y.o. male who presents to the Emergency Department complaining of constant moderate epigastric abdominal pain that started this morning. Reports nausea without vomiting. Hx of appendectomy. . Pt's wife states that pt reported feeling bad this morning with abdominal pain and transient chest pain. She also states that pt's breathing has been irregular lately. Pt reports that breathing hard exacerbates the pain.  He denies any current chest pain. Pt's wife reports that pt has a tracheostomy. She denies any nausea, vomiting, diarrhea, decreased appetite in pt.    Past Medical History  Diagnosis Date  . Arteriosclerotic cardiovascular disease (ASCVD)     Nonobstructive; 09/2008 50% proximal and 40% mid LAD; 25% circumflex; 30% RCA; mild global LV dysfunction with EF of 45%. No aortic stenosis.  . Mild aortic stenosis     not documented at catheterization; verified by echo in 2011  . Peripheral vascular disease     With a 70% innominate artery stenosis and nonobstructive carotid stenosis  . Hypothyroidism   . Degenerative joint disease     s/p bilateral TKR  . Mobitz (type) II atrioventricular block     With bradycardia; Medtronic pacemaker implanted in 09/2008  . Tobacco abuse, in remission     Remote  . GERD (gastroesophageal reflux disease)   . Hyperlipidemia     Lipid profile in 04/2010:115, 98, 43, 52.  . Cancer of larynx     laryngectomy in 1988; postoperative radiation therapy  . Weight loss     50 pounds between 1991 and 2011  . Congenital eventration of  left crus of diaphragm     Scarring at left lung base  . Adrenal hyperplasia     Stable on serial imaging  . Anemia     minimal in 2011 with hemoglobin of 12.2 and high normal MCV  . Borderline hypertension     Normal CMet in 2011  . Skin cancer    Past Surgical History  Procedure Laterality Date  . Laryngectomy  1988    S/P laryngectomy and radiation therapy  . Appendectomy  1973  . Knee arthroscopy      Left  . Total knee arthroplasty      Bilateral, 19 years ago  . Cataract extraction, bilateral    . Decompression facial nerve      Right median  . Pacemaker insertion    . Insert / replace / remove pacemaker     Family History  Problem Relation Age of Onset  . Stroke Mother   . Leukemia Father   . Colon cancer Neg Hx   . Liver disease Neg Hx   . GI problems Neg Hx   . Stroke Other   . Diabetes Other    History  Substance Use Topics  . Smoking status: Former Smoker    Types: Cigarettes  . Smokeless tobacco: Former Systems developer    Quit date: 09/08/1982     Comment: Quit 30 years  . Alcohol Use: No    Review of  Systems  All other systems reviewed and are negative.  A complete 10 system review of systems was obtained and all systems are negative except as noted in the HPI and PMH.   Allergies  Review of patient's allergies indicates no known allergies.  Home Medications   Prior to Admission medications   Medication Sig Start Date End Date Taking? Authorizing Provider  aspirin EC 81 MG tablet Take 81 mg by mouth daily.   Yes Historical Provider, MD  B Complex-C (B-COMPLEX WITH VITAMIN C) tablet Take 1 tablet by mouth daily.   Yes Historical Provider, MD  docusate sodium (COLACE) 100 MG capsule Take 100 mg by mouth 2 (two) times daily.   Yes Historical Provider, MD  finasteride (PROSCAR) 5 MG tablet Take 5 mg by mouth at bedtime.   Yes Historical Provider, MD  Fish Oil-Cholecalciferol (FISH OIL + D3) 1200-1000 MG-UNIT CAPS Take 1 capsule by mouth daily.   Yes  Historical Provider, MD  furosemide (LASIX) 40 MG tablet Take 1 tablet (40 mg total) by mouth daily. Patient taking differently: Take 20 mg by mouth daily.  09/05/14   Asencion Noble, MD  HYDROcodone-acetaminophen (NORCO/VICODIN) 5-325 MG per tablet Take 1 tablet by mouth every 8 (eight) hours as needed for moderate pain or severe pain.  07/15/13   Historical Provider, MD  levothyroxine (SYNTHROID, LEVOTHROID) 175 MCG tablet Take 175 mcg by mouth every evening.     Historical Provider, MD  magnesium oxide (MAG-OX) 400 MG tablet Take 400 mg by mouth 2 (two) times daily.    Historical Provider, MD  methocarbamol (ROBAXIN) 750 MG tablet Take 750 mg by mouth 2 (two) times daily.    Historical Provider, MD  omeprazole (PRILOSEC) 20 MG capsule Take 20 mg by mouth daily.    Historical Provider, MD  Tamsulosin HCl (FLOMAX) 0.4 MG CAPS Take 0.4 mg by mouth 2 (two) times daily.     Historical Provider, MD  vitamin C (ASCORBIC ACID) 500 MG tablet Take 500 mg by mouth daily.    Historical Provider, MD   BP 117/83 mmHg  Pulse 61  Temp(Src) 97.5 F (36.4 C) (Oral)  Resp 10  SpO2 93% Physical Exam  Constitutional: He is oriented to person, place, and time. He appears well-developed and well-nourished.  HENT:  Head: Normocephalic and atraumatic.  Eyes: EOM are normal.  Neck: Normal range of motion.  Tracheostomy stoma without any signs of tracheitis.   Cardiovascular: Normal rate, regular rhythm, normal heart sounds and intact distal pulses.   Pulmonary/Chest: Effort normal and breath sounds normal. No respiratory distress.  Rhonchi at the right base.   Abdominal: Soft. He exhibits no distension. There is tenderness.  Epigastric and RUQ TTP.   Musculoskeletal: Normal range of motion.  Neurological: He is alert and oriented to person, place, and time.  Skin: Skin is warm and dry.  Psychiatric: He has a normal mood and affect. Judgment normal.  Nursing note and vitals reviewed.   ED Course  Procedures  (including critical care time) DIAGNOSTIC STUDIES: Oxygen Saturation is 93% on RA, normal by my interpretation.    COORDINATION OF CARE: 10:32 AM- Pt advised of plan for treatment which includes labs and radiology and pt agrees.  Labs Review Labs Reviewed  COMPREHENSIVE METABOLIC PANEL - Abnormal; Notable for the following:    Sodium 130 (*)    Glucose, Bld 118 (*)    GFR calc non Af Amer 79 (*)    All other components within normal limits  CBC WITH DIFFERENTIAL/PLATELET - Abnormal; Notable for the following:    RBC 3.31 (*)    Hemoglobin 11.2 (*)    HCT 32.6 (*)    All other components within normal limits  TROPONIN I  BRAIN NATRIURETIC PEPTIDE  LIPASE, BLOOD    Imaging Review Dg Chest 2 View  12/23/2014   CLINICAL DATA:  Shortness of breath with chest pain  EXAM: CHEST  2 VIEW  COMPARISON:  09/02/2014  FINDINGS: Cardiac shadow is again enlarged. A pacing device is stable. Elevation of left hemidiaphragm is again noted. Minimal bibasilar atelectatic changes are seen. No focal infiltrate is noted. A skin fold is noted along the right chest wall simulating a pneumothorax  IMPRESSION: Mild bibasilar atelectatic changes   Electronically Signed   By: Inez Catalina M.D.   On: 12/23/2014 10:54   Ct Abdomen Pelvis W Contrast  12/23/2014   CLINICAL DATA:  81 year old male with right upper quadrant pain  EXAM: CT ABDOMEN AND PELVIS WITH CONTRAST  TECHNIQUE: Multidetector CT imaging of the abdomen and pelvis was performed using the standard protocol following bolus administration of intravenous contrast.  CONTRAST:  144mL OMNIPAQUE IOHEXOL 300 MG/ML  SOLN  COMPARISON:  Prior CT abdomen and pelvis 02/10/2014  FINDINGS: Lower Chest: Elevation of the left hemidiaphragm. Combination of dependent atelectasis, subpleural reticulation and early honeycombing. Trace right pleural effusion. Incompletely imaged cardiomegaly. Pacer wires present within the right atrium and right ventricle. No pericardial  effusion. Unremarkable distal esophagus.  Abdomen: Unremarkable CT appearance of the stomach, duodenum, spleen, and pancreas save for fatty atrophy. Adreniform thickening of the glands bilaterally. Normal hepatic contour and morphology. No discrete hepatic lesion. Gallbladder is unremarkable. No intra or extrahepatic biliary ductal dilatation.  Numerous loops of fluid-filled small bowel throughout the abdomen consistent with small bowel obstruction. Transition point noted in the right lower quadrant below the level of the iliac crest at the distal ileum. Small volume free fluid noted about the distal ileum and edema is noted within the distal ileal mesentery. No evidence of free air. Focal intussusception noted incidentally in the left upper quadrant.  Unremarkable appearance of the bilateral kidneys. No focal solid lesion, hydronephrosis or nephrolithiasis.  Pelvis: Moderate volume stool in the rectal vault. No free fluid or suspicious adenopathy.  Bones/Soft Tissues: No acute fracture or aggressive appearing lytic or blastic osseous lesion. Severe advanced multilevel degenerative disc disease. Chronic bilateral L5 pars defects with grade 2 anterolisthesis of L5 on S1. Levoconvex scoliosis.  Vascular: Extensive atherosclerotic vascular calcifications without aneurysmal dilatation. 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 Additionally, there is heavy calcification at the origin of the celiac artery. The IMA appears patent.  IMPRESSION: 1. High-grade distal small-bowel obstruction just proximal to the terminal ileum. Small volume surrounding free fluid and mesenteric edema. No definite bowel ischemia or evidence of free air. 2. 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 The IMA is relatively robust and likely fills the SMA via collateral flow. 3. Focal small bowel intussusception noted incidentally in the left  upper quadrant. This is of dubious clinical significance. 4. Advanced multilevel degenerative disc disease with chronic bilateral L5 pars defects and grade 2 anterolisthesis of L5 on S1.   Electronically Signed   By: Jacqulynn Cadet M.D.   On: 12/23/2014 13:57  I personally reviewed the imaging tests through PACS system I reviewed available ER/hospitalization records through the EMR    EKG Interpretation  Date/Time:  Saturday December 23 2014 09:53:08 EDT Ventricular Rate:  62 PR Interval:  360 QRS Duration: 187 QT Interval:  473 QTC Calculation: 480 R Axis:   -38 Text Interpretation:  Sinus rhythm Prolonged PR interval IVCD, consider  atypical LBBB Baseline wander in lead(s) II V2 V3 V4 Confirmed by Luster Hechler   MD, Rick Warnick ([id number]) on 12/23/2014 10:02:54 AM      MDM   Final diagnoses:  None    Patient with high-grade small bowel obstruction.  NG tube place at this time.  Spoke with Dr. Arnoldo Morale of general surgery who will see the patient in consultation.  Patient will be admitted to the hospitalist service.   I personally performed the services described in this documentation, which was scribed in my presence. The recorded information has been reviewed and is accurate.       Jola Schmidt, MD 12/23/14 [phone]

## 2014-12-23 NOTE — Progress Notes (Signed)
Utilization review Completed Taima Rada RN BSN   

## 2014-12-23 NOTE — ED Notes (Signed)
Sob and chest pain since last night

## 2014-12-23 NOTE — ED Notes (Signed)
Wife took pt's wallet home.

## 2014-12-23 NOTE — ED Notes (Signed)
Pt refuses NGT, Dr Venora Maples informed.

## 2014-12-23 NOTE — ED Notes (Signed)
Completed oral contrast. 

## 2014-12-23 NOTE — H&P (Signed)
[facility] History and Physical  Andre Holder CWU:[id number] DOB: 11/04/1922 DOA: 12/23/2014  Referring physician: Dr. Venora Maples PCP: Asencion Noble, MD   Chief Complaint: abd pain  HPI: Andre Holder is a 81 y.o. male past medical history of nonobstructive CAD, with mild systolic heart failure with an EF of 25% and grade 2 diastolic heart failure, mild aortic stenosis, Mobitz type II AV block status post pacemaker implantation in January 2010, cancer of the larynx with laryngectomy in 1988 status post radiation, appendectomy in 1973 that comes in for epigastric abdominal pain that started the day prior to admission. He reports some nausea but no vomiting. He relates his last bowel movement was the day prior to admission, he is not passing gas. He relates that the pain is not made at her worst sometimes bothered by taking a deep breath. He denies any vomiting diarrhea, fever or chest pain or shortness of breath.  In the ED: His metabolic panel was done that shows mild hyponatremia, CT scan was done that showed high-grade small bowel obstruction, no leukocytosis and mildly elevated BNP.  Review of Systems:  Constitutional:  No weight loss, night sweats, Fevers, chills, fatigue.  HEENT:  No headaches, Difficulty swallowing,Tooth/dental problems,Sore throat,  No sneezing, itching, ear ache, nasal congestion, post nasal drip,  Cardio-vascular:  No chest pain, Orthopnea, PND, swelling in lower extremities, anasarca, dizziness, palpitations  GI:  No heartburn, indigestion,  nausea,  diarrhea, change in bowel habits, loss of appetite  Resp:  No shortness of breath with exertion or at rest. No excess mucus, no productive cough, No non-productive cough, No coughing up of blood.No change in color of mucus.No wheezing.No chest wall deformity  Skin:  no rash or lesions.  GU:  no dysuria, change in color of urine, no urgency or frequency. No flank pain.  Musculoskeletal:  No joint pain or  swelling. No decreased range of motion. No back pain.  Psych:  No change in mood or affect. No depression or anxiety. No memory loss.   Past Medical History  Diagnosis Date  . Arteriosclerotic cardiovascular disease (ASCVD)     Nonobstructive; 09/2008 50% proximal and 40% mid LAD; 25% circumflex; 30% RCA; mild global LV dysfunction with EF of 45%. No aortic stenosis.  . Mild aortic stenosis     not documented at catheterization; verified by echo in 2011  . Peripheral vascular disease     With a 70% innominate artery stenosis and nonobstructive carotid stenosis  . Hypothyroidism   . Degenerative joint disease     s/p bilateral TKR  . Mobitz (type) II atrioventricular block     With bradycardia; Medtronic pacemaker implanted in 09/2008  . Tobacco abuse, in remission     Remote  . GERD (gastroesophageal reflux disease)   . Hyperlipidemia     Lipid profile in 04/2010:115, 98, 43, 52.  . Cancer of larynx     laryngectomy in 1988; postoperative radiation therapy  . Weight loss     50 pounds between 1991 and 2011  . 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 Adrenal hyperplasia     Stable on serial imaging  . Anemia     minimal in 2011 with hemoglobin of 12.2 and high normal MCV  . Borderline hypertension     Normal CMet in 2011  . Skin cancer    Past Surgical History  Procedure Laterality Date  . Laryngectomy  1988  S/P laryngectomy and radiation therapy  . Appendectomy  1973  . Knee arthroscopy      Left  . Total knee arthroplasty      Bilateral, 19 years ago  . Cataract extraction, bilateral    . Decompression facial nerve      Right median  . Pacemaker insertion    . Insert / replace / remove pacemaker     Social History:  reports that he has quit smoking. His smoking use included Cigarettes. He quit smokeless tobacco use about 32 years ago. He reports that he does not drink alcohol or use illicit drugs.  No Known  Allergies  Family History  Problem Relation Age of Onset  . Stroke Mother   . Leukemia Father   . Colon cancer Neg Hx   . Liver disease Neg Hx   . GI problems Neg Hx   . Stroke Other   . Diabetes Other     Prior to Admission medications   Medication Sig Start Date End Date Taking? Authorizing Provider  aspirin EC 81 MG tablet Take 81 mg by mouth daily.   Yes Historical Provider, MD  B Complex-C (B-COMPLEX WITH VITAMIN C) tablet Take 1 tablet by mouth daily.   Yes Historical Provider, MD  docusate sodium (COLACE) 100 MG capsule Take 100 mg by mouth 2 (two) times daily.   Yes Historical Provider, MD  finasteride (PROSCAR) 5 MG tablet Take 5 mg by mouth at bedtime.   Yes Historical Provider, MD  Fish Oil-Cholecalciferol (FISH OIL + D3) 1200-1000 MG-UNIT CAPS Take 1 capsule by mouth daily.   Yes Historical Provider, MD  furosemide (LASIX) 40 MG tablet Take 1 tablet (40 mg total) by mouth daily. Patient taking differently: Take 20 mg by mouth daily.  09/05/14  Yes Asencion Noble, MD  HYDROcodone-acetaminophen (NORCO/VICODIN) 5-325 MG per tablet Take 1 tablet by mouth every 8 (eight) hours as needed for moderate pain or severe pain.  07/15/13  Yes Historical Provider, MD  levothyroxine (SYNTHROID, LEVOTHROID) 175 MCG tablet Take 175 mcg by mouth every evening.    Yes Historical Provider, MD  magnesium oxide (MAG-OX) 400 MG tablet Take 400 mg by mouth 2 (two) times daily.   Yes Historical Provider, MD  Melatonin 3 MG TABS Take 2 tablets by mouth daily.   Yes Historical Provider, MD  methocarbamol (ROBAXIN) 750 MG tablet Take 750 mg by mouth 2 (two) times daily.   Yes Historical Provider, MD  omeprazole (PRILOSEC) 20 MG capsule Take 20 mg by mouth daily.   Yes Historical Provider, MD  OVER THE COUNTER MEDICATION Take 1 tablet by mouth at bedtime. For restless leg.   Yes Historical Provider, MD  Tamsulosin HCl (FLOMAX) 0.4 MG CAPS Take 0.4 mg by mouth 2 (two) times daily.    Yes Historical Provider, MD   vitamin C (ASCORBIC ACID) 500 MG tablet Take 500 mg by mouth daily.   Yes Historical Provider, MD   Physical Exam: Filed Vitals:   12/23/14 1312 12/23/14 1313 12/23/14 1335 12/23/14 1405  BP: 116/83 121/79  125/77  Pulse:  79  80  Temp:      TempSrc:      Resp: 18 19  15   Height:   6\' 2"  (1.88 m)   Weight:   81.647 kg (180 lb)   SpO2:  94%  94%    Wt Readings from Last 3 Encounters:  12/23/14 81.647 kg (180 lb)  09/05/14 74.934 kg (165 lb 3.2 oz)  07/25/14 74.844 kg (165 lb)    General:  Appears calm and comfortable Eyes: PERRL, normal lids, irises & conjunctiva OHY:WVPXTG membranes are dry.  Neck: status post laryngectomy with a lower fist at the sternal notch Cardiovascular: RRR, no m/r/g. No LE edema. Telemetry: SR, no arrhythmias  Respiratory: CTA bilaterally, no w/r/r. Normal respiratory effort. Abdomen: Abdomen is soft with diffuse tenderness no rebound or guarding.: no rash or induration seen on limited exam Musculoskeletal: grossly normal tone BUE/BLE Psychiatric: grossly normal mood and affect, speech fluent and appropriate Neurologic: grossly non-focal.          Labs on Admission:  Basic Metabolic Panel:  Recent Labs Lab 12/23/14 1018  NA 130*  K 4.5  CL 96  CO2 27  GLUCOSE 118*  BUN 21  CREATININE 0.75  CALCIUM 8.9   Liver Function Tests:  Recent Labs Lab 12/23/14 1018  AST 26  ALT 18  ALKPHOS 88  BILITOT 0.7  PROT 7.0  ALBUMIN 3.8    Recent Labs Lab 12/23/14 1018  LIPASE 23   No results for input(s): AMMONIA in the last 168 hours. CBC:  Recent Labs Lab 12/23/14 1018  WBC 6.8  NEUTROABS 5.1  HGB 11.2*  HCT 32.6*  MCV 98.5  PLT 224   Cardiac Enzymes:  Recent Labs Lab 12/23/14 1018  TROPONINI <0.03    BNP (last 3 results)  Recent Labs  08/31/14 0650 09/02/14 2207 12/23/14 1018  BNP 1103.0* 357.0* 1166.0*    ProBNP (last 3 results)  Recent Labs  07/18/14 0002  PROBNP 1363.0*    CBG: No results for  input(s): GLUCAP in the last 168 hours.  Radiological Exams on Admission: Dg Chest 2 View  12/23/2014   CLINICAL DATA:  Shortness of breath with chest pain  EXAM: CHEST  2 VIEW  COMPARISON:  09/02/2014  FINDINGS: Cardiac shadow is again enlarged. A pacing device is stable. Elevation of left hemidiaphragm is again noted. Minimal bibasilar atelectatic changes are seen. No focal infiltrate is noted. A skin fold is noted along the right chest wall simulating a pneumothorax  IMPRESSION: Mild bibasilar atelectatic changes   Electronically Signed   By: Inez Catalina M.D.   On: 12/23/2014 10:54   Ct Abdomen Pelvis W Contrast  12/23/2014   CLINICAL DATA:  81 year old male with right upper quadrant pain  EXAM: CT ABDOMEN AND PELVIS WITH CONTRAST  TECHNIQUE: Multidetector CT imaging of the abdomen and pelvis was performed using the standard protocol following bolus administration of intravenous contrast.  CONTRAST:  180mL OMNIPAQUE IOHEXOL 300 MG/ML  SOLN  COMPARISON:  Prior CT abdomen and pelvis 02/10/2014  FINDINGS: Lower Chest: Elevation of the left hemidiaphragm. Combination of dependent atelectasis, subpleural reticulation and early honeycombing. Trace right pleural effusion. Incompletely imaged cardiomegaly. Pacer wires present within the right atrium and right ventricle. No pericardial effusion. Unremarkable distal esophagus.  Abdomen: Unremarkable CT appearance of the stomach, duodenum, spleen, and pancreas save for fatty atrophy. Adreniform thickening of the glands bilaterally. Normal hepatic contour and morphology. No discrete hepatic lesion. Gallbladder is unremarkable. No intra or extrahepatic biliary ductal dilatation.  Numerous loops of fluid-filled small bowel throughout the abdomen consistent with small bowel obstruction. Transition point noted in the right lower quadrant below the level of the iliac crest at the distal ileum. Small volume free fluid noted about the distal ileum and edema is noted within  the distal ileal mesentery. No evidence of free air. Focal intussusception noted incidentally in the left upper quadrant.  Unremarkable appearance of the bilateral kidneys. No focal solid lesion, hydronephrosis or nephrolithiasis.  Pelvis: Moderate volume stool in the rectal vault. No free fluid or suspicious adenopathy.  Bones/Soft Tissues: No acute fracture or aggressive appearing lytic or blastic osseous lesion. Severe advanced multilevel degenerative disc disease. Chronic bilateral L5 pars defects with grade 2 anterolisthesis of L5 on S1. Levoconvex scoliosis.  Vascular: Extensive atherosclerotic vascular calcifications without aneurysmal dilatation. 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 Additionally, there is heavy calcification at the origin of the celiac artery. The IMA appears patent.  IMPRESSION: 1. High-grade distal small-bowel obstruction just proximal to the terminal ileum. Small volume surrounding free fluid and mesenteric edema. No definite bowel ischemia or evidence of free air. 2. 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 The IMA is relatively robust and likely fills the SMA via collateral flow. 3. Focal small bowel intussusception noted incidentally in the left upper quadrant. This is of dubious clinical significance. 4. Advanced multilevel degenerative disc disease with chronic bilateral L5 pars defects and grade 2 anterolisthesis of L5 on S1.   Electronically Signed   By: Jacqulynn Cadet M.D.   On: 12/23/2014 13:57    EKG: Independently reviewed. Paced rhythm prolonged PR  Assessment/Plan Small bowel obstruction - High-grade stenosis by CT scan, will place an NG tube in place intermittent suction surgery has already been consulted. - Start gentle hydration as he does have a history of systolic heart failure.  - Try to avoid narcotics replete electrolytes as needed.  - High  risk for cardiopulmonary palpitations. The patient is currently not on beta blocker with a low EF.  - Dr. Willey Blade to take over in the morning.   Aortic valve disorder - Holosystolic murmur currently stable.   PACEMAKER, PERMANENT/  Mobitz type II atrioventricular block: - Paced rhythm.   Hyponatremia - Most likely prerenal in etiology we'll go get and start gentle hydration. - Hold Lasix.  Hypothyroidism - Change synthroid to IV.  Chronic systolic congestive heart failure, NYHA class 2 - No JVD lungs are clear, seems to be euvolemic.  Not on a beta blocker or ACE inhibitor. Unclear will defer to PCP in the morning. - His BMP is lower than it was before he was discharged on December 2015.     Dr. Arnoldo Morale general surgery has already been consulted by the ED physician.  Code Status: DNR DVT Prophylaxis:heparin Family Communication: wife Disposition Plan:cannot determine length of stay.  Time spent: 80 minutes  Charlynne Cousins [facility] Pager [phone]

## 2014-12-24 ENCOUNTER — Inpatient Hospital Stay (HOSPITAL_COMMUNITY): Payer: Non-veteran care

## 2014-12-24 LAB — CBC
HCT: 34.3 % — ABNORMAL LOW (ref 39.0–52.0)
Hemoglobin: 11.7 g/dL — ABNORMAL LOW (ref 13.0–17.0)
MCH: 33.3 pg (ref 26.0–34.0)
MCHC: 34.1 g/dL (ref 30.0–36.0)
MCV: 97.7 fL (ref 78.0–100.0)
PLATELETS: 249 10*3/uL (ref 150–400)
RBC: 3.51 MIL/uL — AB (ref 4.22–5.81)
RDW: 13 % (ref 11.5–15.5)
WBC: 9.4 10*3/uL (ref 4.0–10.5)

## 2014-12-24 LAB — BASIC METABOLIC PANEL
ANION GAP: 8 (ref 5–15)
BUN: 22 mg/dL (ref 6–23)
CO2: 26 mmol/L (ref 19–32)
CREATININE: 0.87 mg/dL (ref 0.50–1.35)
Calcium: 8.9 mg/dL (ref 8.4–10.5)
Chloride: 95 mmol/L — ABNORMAL LOW (ref 96–112)
GFR, EST AFRICAN AMERICAN: 86 mL/min — AB (ref >90)
GFR, EST NON AFRICAN AMERICAN: 74 mL/min — AB (ref >90)
Glucose, Bld: 118 mg/dL — ABNORMAL HIGH (ref 70–99)
Potassium: 4.6 mmol/L (ref 3.5–5.1)
Sodium: 129 mmol/L — ABNORMAL LOW (ref 135–145)

## 2014-12-24 MED ORDER — BISACODYL 10 MG RE SUPP
10.0000 mg | Freq: Once | RECTAL | Status: AC
  Administered 2014-12-24: 10 mg via RECTAL
  Filled 2014-12-24: qty 1

## 2014-12-24 NOTE — Progress Notes (Signed)
Subjective: Mr. Jawad feels less pain today. He denies vomiting. His nasogastric tube has been removed. He has had no fever. CT scan reveals a high-grade small bowel obstruction proximal to the ileum. He also has significant atherosclerotic changes as well as a possible intussusception. Symptoms began 2 days ago. He denies vomiting prior to admission and denies significant distention of his abdomen prior to admission. NG tube drainage was reportedly negligible.  Objective: Vital signs in last 24 hours: Filed Vitals:   12/23/14 1513 12/23/14 1605 12/23/14 2219 12/24/14 0653  BP: 112/77 144/76 128/70 132/60  Pulse: 70 78 77 74  Temp:  99 F (37.2 C) 98.9 F (37.2 C) 98.5 F (36.9 C)  TempSrc:  Oral Oral Oral  Resp: 14  18 18   Height:  6\' 2"  (1.88 m)    Weight:  182 lb 8.7 oz (82.8 kg)    SpO2: 92% 92% 94% 95%   Weight change:  No intake or output data in the 24 hours ending 12/24/14 1053  Physical Exam: Alert. No distress. Pharynx moist. Lungs reveal bilateral crackles. Heart regular with a grade 2 systolic murmur. Abdomen is soft and nondistended. Minimal lower abdominal tenderness. No hepatosplenomegaly. Extremities reveal no edema.  Lab Results:    Results for orders placed or performed during the hospital encounter of 12/23/14 (from the past 24 hour(s))  CBC     Status: Abnormal   Collection Time: 12/24/14  6:18 AM  Result Value Ref Range   WBC 9.4 4.0 - 10.5 K/uL   RBC 3.51 (L) 4.22 - 5.81 MIL/uL   Hemoglobin 11.7 (L) 13.0 - 17.0 g/dL   HCT 34.3 (L) 39.0 - 52.0 %   MCV 97.7 78.0 - 100.0 fL   MCH 33.3 26.0 - 34.0 pg   MCHC 34.1 30.0 - 36.0 g/dL   RDW 13.0 11.5 - 15.5 %   Platelets 249 150 - 400 K/uL  Basic metabolic panel     Status: Abnormal   Collection Time: 12/24/14  6:18 AM  Result Value Ref Range   Sodium 129 (L) 135 - 145 mmol/L   Potassium 4.6 3.5 - 5.1 mmol/L   Chloride 95 (L) 96 - 112 mmol/L   CO2 26 19 - 32 mmol/L   Glucose, Bld 118 (H) 70 - 99 mg/dL    BUN 22 6 - 23 mg/dL   Creatinine, Ser 0.87 0.50 - 1.35 mg/dL   Calcium 8.9 8.4 - 10.5 mg/dL   GFR calc non Af Amer 74 (L) >90 mL/min   GFR calc Af Amer 86 (L) >90 mL/min   Anion gap 8 5 - 15     ABGS No results for input(s): PHART, PO2ART, TCO2, HCO3 in the last 72 hours.  Invalid input(s): PCO2 CULTURES No results found for this or any previous visit (from the past 240 hour(s)). Studies/Results: Dg Chest 2 View  12/23/2014   CLINICAL DATA:  Shortness of breath with chest pain  EXAM: CHEST  2 VIEW  COMPARISON:  09/02/2014  FINDINGS: Cardiac shadow is again enlarged. A pacing device is stable. Elevation of left hemidiaphragm is again noted. Minimal bibasilar atelectatic changes are seen. No focal infiltrate is noted. A skin fold is noted along the right chest wall simulating a pneumothorax  IMPRESSION: Mild bibasilar atelectatic changes   Electronically Signed   By: Inez Catalina M.D.   On: 12/23/2014 10:54   Abd 1 View (kub)  12/24/2014   CLINICAL DATA:  Followup small bowel obstruction.  EXAM: ABDOMEN -  1 VIEW  COMPARISON:  CT, 12/23/2014  FINDINGS: There is persistent dilation of the small bowel. Air is seen within a nondistended colon and in the rectum. Findings remain consistent with a partial small bowel obstruction.  Soft tissues demonstrate vascular calcifications but are otherwise unremarkable. There are degenerative changes throughout the visualized spine.  IMPRESSION: Persistent partial small bowel obstruction.   Electronically Signed   By: Lajean Manes M.D.   On: 12/24/2014 07:27   Ct Abdomen Pelvis W Contrast  12/23/2014   CLINICAL DATA:  81 year old male with right upper quadrant pain  EXAM: CT ABDOMEN AND PELVIS WITH CONTRAST  TECHNIQUE: Multidetector CT imaging of the abdomen and pelvis was performed using the standard protocol following bolus administration of intravenous contrast.  CONTRAST:  157mL OMNIPAQUE IOHEXOL 300 MG/ML  SOLN  COMPARISON:  Prior CT abdomen and pelvis  02/10/2014  FINDINGS: Lower Chest: Elevation of the left hemidiaphragm. Combination of dependent atelectasis, subpleural reticulation and early honeycombing. Trace right pleural effusion. Incompletely imaged cardiomegaly. Pacer wires present within the right atrium and right ventricle. No pericardial effusion. Unremarkable distal esophagus.  Abdomen: Unremarkable CT appearance of the stomach, duodenum, spleen, and pancreas save for fatty atrophy. Adreniform thickening of the glands bilaterally. Normal hepatic contour and morphology. No discrete hepatic lesion. Gallbladder is unremarkable. No intra or extrahepatic biliary ductal dilatation.  Numerous loops of fluid-filled small bowel throughout the abdomen consistent with small bowel obstruction. Transition point noted in the right lower quadrant below the level of the iliac crest at the distal ileum. Small volume free fluid noted about the distal ileum and edema is noted within the distal ileal mesentery. No evidence of free air. Focal intussusception noted incidentally in the left upper quadrant.  Unremarkable appearance of the bilateral kidneys. No focal solid lesion, hydronephrosis or nephrolithiasis.  Pelvis: Moderate volume stool in the rectal vault. No free fluid or suspicious adenopathy.  Bones/Soft Tissues: No acute fracture or aggressive appearing lytic or blastic osseous lesion. Severe advanced multilevel degenerative disc disease. Chronic bilateral L5 pars defects with grade 2 anterolisthesis of L5 on S1. Levoconvex scoliosis.  Vascular: Extensive atherosclerotic vascular calcifications without aneurysmal dilatation. 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 Additionally, there is heavy calcification at the origin of the celiac artery. The IMA appears patent.  IMPRESSION: 1. High-grade distal small-bowel obstruction just proximal to the terminal ileum. Small volume surrounding free fluid and mesenteric edema. No definite bowel ischemia  or evidence of free air. 2. 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 The IMA is relatively robust and likely fills the SMA via collateral flow. 3. Focal small bowel intussusception noted incidentally in the left upper quadrant. This is of dubious clinical significance. 4. Advanced multilevel degenerative disc disease with chronic bilateral L5 pars defects and grade 2 anterolisthesis of L5 on S1.   Electronically Signed   By: Jacqulynn Cadet M.D.   On: 12/23/2014 13:57   Micro Results: No results found for this or any previous visit (from the past 240 hour(s)). Studies/Results: Dg Chest 2 View  12/23/2014   CLINICAL DATA:  Shortness of breath with chest pain  EXAM: CHEST  2 VIEW  COMPARISON:  09/02/2014  FINDINGS: Cardiac shadow is again enlarged. A pacing device is stable. Elevation of left hemidiaphragm is again noted. Minimal bibasilar atelectatic changes are seen. No focal infiltrate is noted. A skin fold is noted along the right chest wall simulating a pneumothorax  IMPRESSION: Mild bibasilar atelectatic changes   Electronically Signed   By: Inez Catalina M.D.   On: 12/23/2014 10:54   Abd 1 View (kub)  12/24/2014   CLINICAL DATA:  Followup small bowel obstruction.  EXAM: ABDOMEN - 1 VIEW  COMPARISON:  CT, 12/23/2014  FINDINGS: There is persistent dilation of the small bowel. Air is seen within a nondistended colon and in the rectum. Findings remain consistent with a partial small bowel obstruction.  Soft tissues demonstrate vascular calcifications but are otherwise unremarkable. There are degenerative changes throughout the visualized spine.  IMPRESSION: Persistent partial small bowel obstruction.   Electronically Signed   By: Lajean Manes M.D.   On: 12/24/2014 07:27   Ct Abdomen Pelvis W Contrast  12/23/2014   CLINICAL DATA:  81 year old male with right upper quadrant pain  EXAM: CT ABDOMEN AND PELVIS WITH  CONTRAST  TECHNIQUE: Multidetector CT imaging of the abdomen and pelvis was performed using the standard protocol following bolus administration of intravenous contrast.  CONTRAST:  147mL OMNIPAQUE IOHEXOL 300 MG/ML  SOLN  COMPARISON:  Prior CT abdomen and pelvis 02/10/2014  FINDINGS: Lower Chest: Elevation of the left hemidiaphragm. Combination of dependent atelectasis, subpleural reticulation and early honeycombing. Trace right pleural effusion. Incompletely imaged cardiomegaly. Pacer wires present within the right atrium and right ventricle. No pericardial effusion. Unremarkable distal esophagus.  Abdomen: Unremarkable CT appearance of the stomach, duodenum, spleen, and pancreas save for fatty atrophy. Adreniform thickening of the glands bilaterally. Normal hepatic contour and morphology. No discrete hepatic lesion. Gallbladder is unremarkable. No intra or extrahepatic biliary ductal dilatation.  Numerous loops of fluid-filled small bowel throughout the abdomen consistent with small bowel obstruction. Transition point noted in the right lower quadrant below the level of the iliac crest at the distal ileum. Small volume free fluid noted about the distal ileum and edema is noted within the distal ileal mesentery. No evidence of free air. Focal intussusception noted incidentally in the left upper quadrant.  Unremarkable appearance of the bilateral kidneys. No focal solid lesion, hydronephrosis or nephrolithiasis.  Pelvis: Moderate volume stool in the rectal vault. No free fluid or suspicious adenopathy.  Bones/Soft Tissues: No acute fracture or aggressive appearing lytic or blastic osseous lesion. Severe advanced multilevel degenerative disc disease. Chronic bilateral L5 pars defects with grade 2 anterolisthesis of L5 on S1. Levoconvex scoliosis.  Vascular: Extensive atherosclerotic vascular calcifications without aneurysmal dilatation. 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 Additionally, there  is heavy calcification at the origin of the celiac artery. The IMA appears patent.  IMPRESSION: 1. High-grade distal small-bowel obstruction just proximal to the terminal ileum. Small volume surrounding free fluid and mesenteric edema. No definite bowel ischemia or evidence of free air. 2. 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 The IMA is relatively robust and likely fills the SMA via collateral flow. 3. Focal small bowel intussusception noted incidentally in the left upper quadrant. This is of dubious clinical significance. 4. Advanced multilevel degenerative disc disease with chronic bilateral L5 pars defects and grade 2 anterolisthesis of L5 on S1.   Electronically Signed   By: Jacqulynn Cadet M.D.   On: 12/23/2014 13:57   Medications:  I have reviewed the patient's current medications Scheduled Meds: . heparin  5,000 Units Subcutaneous 3 times per day  . levothyroxine  87.5 mcg Intravenous Daily  . methocarbamol  750 mg Oral BID  . pantoprazole (PROTONIX) IV  40 mg  Intravenous QHS   Continuous Infusions: . sodium chloride 50 mL/hr at 12/23/14 1840   PRN Meds:.morphine injection, ondansetron **OR** ondansetron (ZOFRAN) IV   Assessment/Plan: #1. Small bowel obstruction. Exam is less impressive than the CT scan. Discussed with Dr. Arnoldo Morale. As stated, the NG tube has been removed. Subsequent clear liquids will be allowed for now. #2. Chronic systolic heart failure. Will minimize IV hydration. #3. Abdominal atherosclerosis. #4. Hypothyroidism. Continue levothyroxine. 5. Aortic stenosis. Asymptomatic. #6. Chest post pacemaker placement. #7. Chronic hyponatremia. Principal Problem:   Small bowel obstruction Active Problems:   Hypothyroidism   Aortic valve disorder   Mobitz type II atrioventricular block   PACEMAKER, PERMANENT   Hyponatremia   Chronic systolic congestive heart failure, NYHA class  2   SBO (small bowel obstruction)     LOS: 1 day   Andre Holder 12/24/2014, 10:53 AM

## 2014-12-24 NOTE — Consult Note (Signed)
Reason for Consult: Small bowel obstruction Referring Physician: Dr. Asencion Noble  Andre Holder is an 81 y.o. male.  HPI: Patient is an 81 year old white male multiple medical problems who presents with nonspecific abdominal pain and nausea. He has not had no emesis. A CT scan the abdomen was performed which revealed a small bowel obstruction with possible adhesive disease in the right lower quadrant, though there was gas in the colon. He also had significant mesenteric arterial disease. He states that this has been occurring over the past few days. He last had a bowel movement 2 days ago.  Past Medical History  Diagnosis Date  . Arteriosclerotic cardiovascular disease (ASCVD)     Nonobstructive; 09/2008 50% proximal and 40% mid LAD; 25% circumflex; 30% RCA; mild global LV dysfunction with EF of 45%. No aortic stenosis.  . Mild aortic stenosis     not documented at catheterization; verified by echo in 2011  . Peripheral vascular disease     With a 70% innominate artery stenosis and nonobstructive carotid stenosis  . Hypothyroidism   . Degenerative joint disease     s/p bilateral TKR  . Mobitz (type) II atrioventricular block     With bradycardia; [employer] pacemaker implanted in 09/2008  . Tobacco abuse, in remission     Remote  . GERD (gastroesophageal reflux disease)   . Hyperlipidemia     Lipid profile in 04/2010:115, 98, 43, 52.  . Weight loss     50 pounds between 1991 and 2011  . 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 Adrenal hyperplasia     Stable on serial imaging  . Anemia     minimal in 2011 with hemoglobin of 12.2 and high normal MCV  . Borderline hypertension     Normal CMet in 2011  . Cancer of larynx     laryngectomy in 1988; postoperative radiation therapy  . Skin cancer     Past Surgical History  Procedure Laterality Date  . Laryngectomy  1988    S/P laryngectomy and radiation therapy  . Appendectomy  1973  . Knee  arthroscopy      Left  . Total knee arthroplasty      Bilateral, 19 years ago  . Cataract extraction, bilateral    . Decompression facial nerve      Right median  . Pacemaker insertion    . Insert / replace / remove pacemaker      Family History  Problem Relation Age of Onset  . Stroke Mother   . Leukemia Father   . Colon cancer Neg Hx   . Liver disease Neg Hx   . GI problems Neg Hx   . Stroke Other   . Diabetes Other     Social History:  reports that he has quit smoking. His smoking use included Cigarettes. He quit smokeless tobacco use about 32 years ago. He reports that he does not drink alcohol or use illicit drugs.  Allergies: No Known Allergies  Medications: I have reviewed the patient's current medications.  Results for orders placed or performed during the hospital encounter of 12/23/14 (from the past 48 hour(s))  Comprehensive metabolic panel     Status: Abnormal   Collection Time: 12/23/14 10:18 AM  Result Value Ref Range   Sodium 130 (L) 135 - 145 mmol/L   Potassium 4.5 3.5 - 5.1 mmol/L   Chloride 96 96 - 112 mmol/L   CO2 27  19 - 32 mmol/L   Glucose, Bld 118 (H) 70 - 99 mg/dL   BUN 21 6 - 23 mg/dL   Creatinine, Ser 0.75 0.50 - 1.35 mg/dL   Calcium 8.9 8.4 - 10.5 mg/dL   Total Protein 7.0 6.0 - 8.3 g/dL   Albumin 3.8 3.5 - 5.2 g/dL   AST 26 0 - 37 U/L   ALT 18 0 - 53 U/L   Alkaline Phosphatase 88 39 - 117 U/L   Total Bilirubin 0.7 0.3 - 1.2 mg/dL   GFR calc non Af Amer 79 (L) >90 mL/min   GFR calc Af Amer >90 >90 mL/min    Comment: (NOTE) The eGFR has been calculated using the CKD EPI equation. This calculation has not been validated in all clinical situations. eGFR's persistently <90 mL/min signify possible Chronic Kidney Disease.    Anion gap 7 5 - 15  CBC with Differential     Status: Abnormal   Collection Time: 12/23/14 10:18 AM  Result Value Ref Range   WBC 6.8 4.0 - 10.5 K/uL   RBC 3.31 (L) 4.22 - 5.81 MIL/uL   Hemoglobin 11.2 (L) 13.0 -  17.0 g/dL   HCT 32.6 (L) 39.0 - 52.0 %   MCV 98.5 78.0 - 100.0 fL   MCH 33.8 26.0 - 34.0 pg   MCHC 34.4 30.0 - 36.0 g/dL   RDW 12.9 11.5 - 15.5 %   Platelets 224 150 - 400 K/uL   Neutrophils Relative % 75 43 - 77 %   Neutro Abs 5.1 1.7 - 7.7 K/uL   Lymphocytes Relative 14 12 - 46 %   Lymphs Abs 1.0 0.7 - 4.0 K/uL   Monocytes Relative 7 3 - 12 %   Monocytes Absolute 0.5 0.1 - 1.0 K/uL   Eosinophils Relative 4 0 - 5 %   Eosinophils Absolute 0.3 0.0 - 0.7 K/uL   Basophils Relative 0 0 - 1 %   Basophils Absolute 0.0 0.0 - 0.1 K/uL  Troponin I     Status: None   Collection Time: 12/23/14 10:18 AM  Result Value Ref Range   Troponin I <0.03 <0.031 ng/mL    Comment:        NO INDICATION OF MYOCARDIAL INJURY.   Brain natriuretic peptide     Status: Abnormal   Collection Time: 12/23/14 10:18 AM  Result Value Ref Range   B Natriuretic Peptide 1166.0 (H) 0.0 - 100.0 pg/mL  Lipase, blood     Status: None   Collection Time: 12/23/14 10:18 AM  Result Value Ref Range   Lipase 23 11 - 59 U/L  CBC     Status: Abnormal   Collection Time: 12/24/14  6:18 AM  Result Value Ref Range   WBC 9.4 4.0 - 10.5 K/uL   RBC 3.51 (L) 4.22 - 5.81 MIL/uL   Hemoglobin 11.7 (L) 13.0 - 17.0 g/dL   HCT 34.3 (L) 39.0 - 52.0 %   MCV 97.7 78.0 - 100.0 fL   MCH 33.3 26.0 - 34.0 pg   MCHC 34.1 30.0 - 36.0 g/dL   RDW 13.0 11.5 - 15.5 %   Platelets 249 150 - 400 K/uL  Basic metabolic panel     Status: Abnormal   Collection Time: 12/24/14  6:18 AM  Result Value Ref Range   Sodium 129 (L) 135 - 145 mmol/L   Potassium 4.6 3.5 - 5.1 mmol/L   Chloride 95 (L) 96 - 112 mmol/L   CO2 26 19 -  32 mmol/L   Glucose, Bld 118 (H) 70 - 99 mg/dL   BUN 22 6 - 23 mg/dL   Creatinine, Ser 0.87 0.50 - 1.35 mg/dL   Calcium 8.9 8.4 - 10.5 mg/dL   GFR calc non Af Amer 74 (L) >90 mL/min   GFR calc Af Amer 86 (L) >90 mL/min    Comment: (NOTE) The eGFR has been calculated using the CKD EPI equation. This calculation has not been  validated in all clinical situations. eGFR's persistently <90 mL/min signify possible Chronic Kidney Disease.    Anion gap 8 5 - 15    Dg Chest 2 View  12/23/2014   CLINICAL DATA:  Shortness of breath with chest pain  EXAM: CHEST  2 VIEW  COMPARISON:  09/02/2014  FINDINGS: Cardiac shadow is again enlarged. A pacing device is stable. Elevation of left hemidiaphragm is again noted. Minimal bibasilar atelectatic changes are seen. No focal infiltrate is noted. A skin fold is noted along the right chest wall simulating a pneumothorax  IMPRESSION: Mild bibasilar atelectatic changes   Electronically Signed   By: Inez Catalina M.D.   On: 12/23/2014 10:54   Abd 1 View (kub)  12/24/2014   CLINICAL DATA:  Followup small bowel obstruction.  EXAM: ABDOMEN - 1 VIEW  COMPARISON:  CT, 12/23/2014  FINDINGS: There is persistent dilation of the small bowel. Air is seen within a nondistended colon and in the rectum. Findings remain consistent with a partial small bowel obstruction.  Soft tissues demonstrate vascular calcifications but are otherwise unremarkable. There are degenerative changes throughout the visualized spine.  IMPRESSION: Persistent partial small bowel obstruction.   Electronically Signed   By: Lajean Manes M.D.   On: 12/24/2014 07:27   Ct Abdomen Pelvis W Contrast  12/23/2014   CLINICAL DATA:  81 year old male with right upper quadrant pain  EXAM: CT ABDOMEN AND PELVIS WITH CONTRAST  TECHNIQUE: Multidetector CT imaging of the abdomen and pelvis was performed using the standard protocol following bolus administration of intravenous contrast.  CONTRAST:  140m OMNIPAQUE IOHEXOL 300 MG/ML  SOLN  COMPARISON:  Prior CT abdomen and pelvis 02/10/2014  FINDINGS: Lower Chest: Elevation of the left hemidiaphragm. Combination of dependent atelectasis, subpleural reticulation and early honeycombing. Trace right pleural effusion. Incompletely imaged cardiomegaly. Pacer wires present within the right atrium and right  ventricle. No pericardial effusion. Unremarkable distal esophagus.  Abdomen: Unremarkable CT appearance of the stomach, duodenum, spleen, and pancreas save for fatty atrophy. Adreniform thickening of the glands bilaterally. Normal hepatic contour and morphology. No discrete hepatic lesion. Gallbladder is unremarkable. No intra or extrahepatic biliary ductal dilatation.  Numerous loops of fluid-filled small bowel throughout the abdomen consistent with small bowel obstruction. Transition point noted in the right lower quadrant below the level of the iliac crest at the distal ileum. Small volume free fluid noted about the distal ileum and edema is noted within the distal ileal mesentery. No evidence of free air. Focal intussusception noted incidentally in the left upper quadrant.  Unremarkable appearance of the bilateral kidneys. No focal solid lesion, hydronephrosis or nephrolithiasis.  Pelvis: Moderate volume stool in the rectal vault. No free fluid or suspicious adenopathy.  Bones/Soft Tissues: No acute fracture or aggressive appearing lytic or blastic osseous lesion. Severe advanced multilevel degenerative disc disease. Chronic bilateral L5 pars defects with grade 2 anterolisthesis of L5 on S1. Levoconvex scoliosis.  Vascular: Extensive atherosclerotic vascular calcifications without aneurysmal dilatation. 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 Additionally, there is heavy  calcification at the origin of the celiac artery. The IMA appears patent.  IMPRESSION: 1. High-grade distal small-bowel obstruction just proximal to the terminal ileum. Small volume surrounding free fluid and mesenteric edema. No definite bowel ischemia or evidence of free air. 2. 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 The IMA is relatively robust and likely fills the SMA via collateral flow. 3. Focal small bowel intussusception noted  incidentally in the left upper quadrant. This is of dubious clinical significance. 4. Advanced multilevel degenerative disc disease with chronic bilateral L5 pars defects and grade 2 anterolisthesis of L5 on S1.   Electronically Signed   By: Jacqulynn Cadet M.D.   On: 12/23/2014 13:57    ROS: See chart Blood pressure 132/60, pulse 74, temperature 98.5 F (36.9 C), temperature source Oral, resp. rate 18, height '6\' 2"'  (1.88 m), weight 82.8 kg (182 lb 8.7 oz), SpO2 95 %. Physical Exam: Pleasant white male in no acute distress. His abdomen is soft and not particularly distended. Minimal bowel sounds appreciated. He points to the upper portion of his abdomen is being sore. No rigidity is noted. No hernias are noted.  Assessment/Plan: Impression: Small bowel obstruction, partial. There may be a function of bowel dysmotility secondary to his mesenteric arterial disease. I do not think he has an acute ischemic disease of the bowel requiring any surgical intervention at this time. Given his multiple medical problems, would like to avoid surgical intervention at this time. His NG tube was removed as it was not in adequate position. We will try sips of clears. This was discussed with Dr. Asencion Noble.  Rebeckah Masih A 12/24/2014, 11:43 AM

## 2014-12-25 LAB — BASIC METABOLIC PANEL
ANION GAP: 6 (ref 5–15)
BUN: 24 mg/dL — AB (ref 6–23)
CHLORIDE: 101 mmol/L (ref 96–112)
CO2: 24 mmol/L (ref 19–32)
Calcium: 8.3 mg/dL — ABNORMAL LOW (ref 8.4–10.5)
Creatinine, Ser: 0.84 mg/dL (ref 0.50–1.35)
GFR calc non Af Amer: 75 mL/min — ABNORMAL LOW (ref >90)
GFR, EST AFRICAN AMERICAN: 87 mL/min — AB (ref >90)
Glucose, Bld: 100 mg/dL — ABNORMAL HIGH (ref 70–99)
Potassium: 4.6 mmol/L (ref 3.5–5.1)
Sodium: 131 mmol/L — ABNORMAL LOW (ref 135–145)

## 2014-12-25 MED ORDER — TAMSULOSIN HCL 0.4 MG PO CAPS
0.4000 mg | ORAL_CAPSULE | Freq: Two times a day (BID) | ORAL | Status: DC
  Administered 2014-12-25 – 2014-12-26 (×3): 0.4 mg via ORAL
  Filled 2014-12-25 (×3): qty 1

## 2014-12-25 MED ORDER — PANTOPRAZOLE SODIUM 40 MG PO TBEC
40.0000 mg | DELAYED_RELEASE_TABLET | Freq: Every day | ORAL | Status: DC
  Administered 2014-12-25 – 2014-12-26 (×2): 40 mg via ORAL
  Filled 2014-12-25 (×2): qty 1

## 2014-12-25 MED ORDER — LEVOTHYROXINE SODIUM 75 MCG PO TABS
175.0000 ug | ORAL_TABLET | Freq: Every day | ORAL | Status: DC
  Administered 2014-12-25 – 2014-12-26 (×2): 175 ug via ORAL
  Filled 2014-12-25 (×4): qty 1

## 2014-12-25 MED ORDER — TRAZODONE HCL 50 MG PO TABS
25.0000 mg | ORAL_TABLET | Freq: Once | ORAL | Status: AC
  Administered 2014-12-25: 25 mg via ORAL
  Filled 2014-12-25: qty 1

## 2014-12-25 MED ORDER — FINASTERIDE 5 MG PO TABS
5.0000 mg | ORAL_TABLET | Freq: Every day | ORAL | Status: DC
  Administered 2014-12-25: 5 mg via ORAL
  Filled 2014-12-25 (×3): qty 1

## 2014-12-25 NOTE — Progress Notes (Signed)
Subjective: [name] is feeling better. He states he had runny stools last night. He is not having abdominal pain now. He denies nausea or vomiting. Vital signs are normal.  Objective: Vital signs in last 24 hours: Filed Vitals:   12/23/14 2219 12/24/14 0653 12/24/14 2139 12/25/14 0650  BP: 128/70 132/60 112/55 118/58  Pulse: 77 74 71 62  Temp: 98.9 F (37.2 C) 98.5 F (36.9 C) 98.7 F (37.1 C) 98.8 F (37.1 C)  TempSrc: Oral Oral Oral Oral  Resp: 18 18 18 18   Height:      Weight:      SpO2: 94% 95% 91% 97%   Weight change:   Intake/Output Summary (Last 24 hours) at 12/25/14 0743 Last data filed at 12/24/14 1856  Gross per 24 hour  Intake 1213.33 ml  Output      0 ml  Net 1213.33 ml    Physical Exam: Alert. No distress. Lungs reveal bilateral crackles. Heart regular with a grade 2 systolic murmur. Abdomen reveals audible bowel sounds. No distention. Nontender. No hepatosplenomegaly.  Lab Results:    Results for orders placed or performed during the hospital encounter of 12/23/14 (from the past 24 hour(s))  Basic metabolic panel     Status: Abnormal   Collection Time: 12/25/14  6:10 AM  Result Value Ref Range   Sodium 131 (L) 135 - 145 mmol/L   Potassium 4.6 3.5 - 5.1 mmol/L   Chloride 101 96 - 112 mmol/L   CO2 24 19 - 32 mmol/L   Glucose, Bld 100 (H) 70 - 99 mg/dL   BUN 24 (H) 6 - 23 mg/dL   Creatinine, Ser 0.84 0.50 - 1.35 mg/dL   Calcium 8.3 (L) 8.4 - 10.5 mg/dL   GFR calc non Af Amer 75 (L) >90 mL/min   GFR calc Af Amer 87 (L) >90 mL/min   Anion gap 6 5 - 15     ABGS No results for input(s): PHART, PO2ART, TCO2, HCO3 in the last 72 hours.  Invalid input(s): PCO2 CULTURES No results found for this or any previous visit (from the past 240 hour(s)). Studies/Results: Dg Chest 2 View  12/23/2014   CLINICAL DATA:  Shortness of breath with chest pain  EXAM: CHEST  2 VIEW  COMPARISON:  09/02/2014  FINDINGS: Cardiac shadow is again enlarged. A pacing device  is stable. Elevation of left hemidiaphragm is again noted. Minimal bibasilar atelectatic changes are seen. No focal infiltrate is noted. A skin fold is noted along the right chest wall simulating a pneumothorax  IMPRESSION: Mild bibasilar atelectatic changes   Electronically Signed   By: Inez Catalina M.D.   On: 12/23/2014 10:54   Abd 1 View (kub)  12/24/2014   CLINICAL DATA:  Followup small bowel obstruction.  EXAM: ABDOMEN - 1 VIEW  COMPARISON:  CT, 12/23/2014  FINDINGS: There is persistent dilation of the small bowel. Air is seen within a nondistended colon and in the rectum. Findings remain consistent with a partial small bowel obstruction.  Soft tissues demonstrate vascular calcifications but are otherwise unremarkable. There are degenerative changes throughout the visualized spine.  IMPRESSION: Persistent partial small bowel obstruction.   Electronically Signed   By: Lajean Manes M.D.   On: 12/24/2014 07:27   Ct Abdomen Pelvis W Contrast  12/23/2014   CLINICAL DATA:  81 year old male with right upper quadrant pain  EXAM: CT ABDOMEN AND PELVIS WITH CONTRAST  TECHNIQUE: Multidetector CT imaging of the abdomen and pelvis was performed using the  standard protocol following bolus administration of intravenous contrast.  CONTRAST:  154mL OMNIPAQUE IOHEXOL 300 MG/ML  SOLN  COMPARISON:  Prior CT abdomen and pelvis 02/10/2014  FINDINGS: Lower Chest: Elevation of the left hemidiaphragm. Combination of dependent atelectasis, subpleural reticulation and early honeycombing. Trace right pleural effusion. Incompletely imaged cardiomegaly. Pacer wires present within the right atrium and right ventricle. No pericardial effusion. Unremarkable distal esophagus.  Abdomen: Unremarkable CT appearance of the stomach, duodenum, spleen, and pancreas save for fatty atrophy. Adreniform thickening of the glands bilaterally. Normal hepatic contour and morphology. No discrete hepatic lesion. Gallbladder is unremarkable. No intra or  extrahepatic biliary ductal dilatation.  Numerous loops of fluid-filled small bowel throughout the abdomen consistent with small bowel obstruction. Transition point noted in the right lower quadrant below the level of the iliac crest at the distal ileum. Small volume free fluid noted about the distal ileum and edema is noted within the distal ileal mesentery. No evidence of free air. Focal intussusception noted incidentally in the left upper quadrant.  Unremarkable appearance of the bilateral kidneys. No focal solid lesion, hydronephrosis or nephrolithiasis.  Pelvis: Moderate volume stool in the rectal vault. No free fluid or suspicious adenopathy.  Bones/Soft Tissues: No acute fracture or aggressive appearing lytic or blastic osseous lesion. Severe advanced multilevel degenerative disc disease. Chronic bilateral L5 pars defects with grade 2 anterolisthesis of L5 on S1. Levoconvex scoliosis.  Vascular: Extensive atherosclerotic vascular calcifications without aneurysmal dilatation. 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 Additionally, there is heavy calcification at the origin of the celiac artery. The IMA appears patent.  IMPRESSION: 1. High-grade distal small-bowel obstruction just proximal to the terminal ileum. Small volume surrounding free fluid and mesenteric edema. No definite bowel ischemia or evidence of free air. 2. 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 The IMA is relatively robust and likely fills the SMA via collateral flow. 3. Focal small bowel intussusception noted incidentally in the left upper quadrant. This is of dubious clinical significance. 4. Advanced multilevel degenerative disc disease with chronic bilateral L5 pars defects and grade 2 anterolisthesis of L5 on S1.   Electronically Signed   By: Jacqulynn Cadet M.D.   On: 12/23/2014 13:57   Micro Results: No results found for  this or any previous visit (from the past 240 hour(s)). Studies/Results: Dg Chest 2 View  12/23/2014   CLINICAL DATA:  Shortness of breath with chest pain  EXAM: CHEST  2 VIEW  COMPARISON:  09/02/2014  FINDINGS: Cardiac shadow is again enlarged. A pacing device is stable. Elevation of left hemidiaphragm is again noted. Minimal bibasilar atelectatic changes are seen. No focal infiltrate is noted. A skin fold is noted along the right chest wall simulating a pneumothorax  IMPRESSION: Mild bibasilar atelectatic changes   Electronically Signed   By: Inez Catalina M.D.   On: 12/23/2014 10:54   Abd 1 View (kub)  12/24/2014   CLINICAL DATA:  Followup small bowel obstruction.  EXAM: ABDOMEN - 1 VIEW  COMPARISON:  CT, 12/23/2014  FINDINGS: There is persistent dilation of the small bowel. Air is seen within a nondistended colon and in the rectum. Findings remain consistent with a partial small bowel obstruction.  Soft tissues demonstrate vascular calcifications but are otherwise unremarkable. There are degenerative changes throughout the visualized spine.  IMPRESSION: Persistent partial small bowel obstruction.   Electronically Signed   By: Lajean Manes M.D.   On: 12/24/2014 07:27  Ct Abdomen Pelvis W Contrast  12/23/2014   CLINICAL DATA:  81 year old male with right upper quadrant pain  EXAM: CT ABDOMEN AND PELVIS WITH CONTRAST  TECHNIQUE: Multidetector CT imaging of the abdomen and pelvis was performed using the standard protocol following bolus administration of intravenous contrast.  CONTRAST:  141mL OMNIPAQUE IOHEXOL 300 MG/ML  SOLN  COMPARISON:  Prior CT abdomen and pelvis 02/10/2014  FINDINGS: Lower Chest: Elevation of the left hemidiaphragm. Combination of dependent atelectasis, subpleural reticulation and early honeycombing. Trace right pleural effusion. Incompletely imaged cardiomegaly. Pacer wires present within the right atrium and right ventricle. No pericardial effusion. Unremarkable distal esophagus.   Abdomen: Unremarkable CT appearance of the stomach, duodenum, spleen, and pancreas save for fatty atrophy. Adreniform thickening of the glands bilaterally. Normal hepatic contour and morphology. No discrete hepatic lesion. Gallbladder is unremarkable. No intra or extrahepatic biliary ductal dilatation.  Numerous loops of fluid-filled small bowel throughout the abdomen consistent with small bowel obstruction. Transition point noted in the right lower quadrant below the level of the iliac crest at the distal ileum. Small volume free fluid noted about the distal ileum and edema is noted within the distal ileal mesentery. No evidence of free air. Focal intussusception noted incidentally in the left upper quadrant.  Unremarkable appearance of the bilateral kidneys. No focal solid lesion, hydronephrosis or nephrolithiasis.  Pelvis: Moderate volume stool in the rectal vault. No free fluid or suspicious adenopathy.  Bones/Soft Tissues: No acute fracture or aggressive appearing lytic or blastic osseous lesion. Severe advanced multilevel degenerative disc disease. Chronic bilateral L5 pars defects with grade 2 anterolisthesis of L5 on S1. Levoconvex scoliosis.  Vascular: Extensive atherosclerotic vascular calcifications without aneurysmal dilatation. 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 Additionally, there is heavy calcification at the origin of the celiac artery. The IMA appears patent.  IMPRESSION: 1. High-grade distal small-bowel obstruction just proximal to the terminal ileum. Small volume surrounding free fluid and mesenteric edema. No definite bowel ischemia or evidence of free air. 2. 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 The IMA is relatively robust and likely fills the SMA via collateral flow. 3. Focal small bowel intussusception noted incidentally in the left upper quadrant. This is of dubious clinical  significance. 4. Advanced multilevel degenerative disc disease with chronic bilateral L5 pars defects and grade 2 anterolisthesis of L5 on S1.   Electronically Signed   By: Jacqulynn Cadet M.D.   On: 12/23/2014 13:57   Medications:  I have reviewed the patient's current medications Scheduled Meds: . heparin  5,000 Units Subcutaneous 3 times per day  . levothyroxine  87.5 mcg Intravenous Daily  . methocarbamol  750 mg Oral BID  . pantoprazole (PROTONIX) IV  40 mg Intravenous QHS   Continuous Infusions: . sodium chloride 50 mL/hr at 12/25/14 0648   PRN Meds:.morphine injection, ondansetron **OR** ondansetron (ZOFRAN) IV   Assessment/Plan: #1. Small bowel obstruction. Improved. He tolerated liquids well. Advanced to a soft diet. #2. Hyponatremia. Improved to 131. Discontinue IV fluids. #3. Chronic systolic heart failure. Stable. Principal Problem:   Small bowel obstruction Active Problems:   Hypothyroidism   Aortic valve disorder   Mobitz type II atrioventricular block   PACEMAKER, PERMANENT   Hyponatremia   Chronic systolic congestive heart failure, NYHA class 2   SBO (small bowel obstruction)     LOS: 2 days   Tomie Spizzirri 12/25/2014, 7:43 AM

## 2014-12-25 NOTE — Progress Notes (Signed)
Subjective: Denies any abdominal pain. Had multiple bowel movements over the past 24 hours.  Objective: Vital signs in last 24 hours: Temp:  [98.7 F (37.1 C)-98.8 F (37.1 C)] 98.8 F (37.1 C) (04/18 0650) Pulse Rate:  [62-71] 62 (04/18 0650) Resp:  [18] 18 (04/18 0650) BP: (112-118)/(55-58) 118/58 mmHg (04/18 0650) SpO2:  [91 %-97 %] 97 % (04/18 0650) Last BM Date: 12/24/14  Intake/Output from previous day: 04/17 0701 - 04/18 0700 In: 1213.3 [I.V.:1213.3] Out: -  Intake/Output this shift:    General appearance: alert, cooperative and no distress GI: soft, non-tender; bowel sounds normal; no masses,  no organomegaly  Lab Results:   Recent Labs  12/23/14 1018 12/24/14 0618  WBC 6.8 9.4  HGB 11.2* 11.7*  HCT 32.6* 34.3*  PLT 224 249   BMET  Recent Labs  12/24/14 0618 12/25/14 0610  NA 129* 131*  K 4.6 4.6  CL 95* 101  CO2 26 24  GLUCOSE 118* 100*  BUN 22 24*  CREATININE 0.87 0.84  CALCIUM 8.9 8.3*   PT/INR No results for input(s): LABPROT, INR in the last 72 hours.  Studies/Results: Dg Chest 2 View  12/23/2014   CLINICAL DATA:  Shortness of breath with chest pain  EXAM: CHEST  2 VIEW  COMPARISON:  09/02/2014  FINDINGS: Cardiac shadow is again enlarged. A pacing device is stable. Elevation of left hemidiaphragm is again noted. Minimal bibasilar atelectatic changes are seen. No focal infiltrate is noted. A skin fold is noted along the right chest wall simulating a pneumothorax  IMPRESSION: Mild bibasilar atelectatic changes   Electronically Signed   By: Inez Catalina M.D.   On: 12/23/2014 10:54   Abd 1 View (kub)  12/24/2014   CLINICAL DATA:  Followup small bowel obstruction.  EXAM: ABDOMEN - 1 VIEW  COMPARISON:  CT, 12/23/2014  FINDINGS: There is persistent dilation of the small bowel. Air is seen within a nondistended colon and in the rectum. Findings remain consistent with a partial small bowel obstruction.  Soft tissues demonstrate vascular  calcifications but are otherwise unremarkable. There are degenerative changes throughout the visualized spine.  IMPRESSION: Persistent partial small bowel obstruction.   Electronically Signed   By: Lajean Manes M.D.   On: 12/24/2014 07:27   Ct Abdomen Pelvis W Contrast  12/23/2014   CLINICAL DATA:  81 year old male with right upper quadrant pain  EXAM: CT ABDOMEN AND PELVIS WITH CONTRAST  TECHNIQUE: Multidetector CT imaging of the abdomen and pelvis was performed using the standard protocol following bolus administration of intravenous contrast.  CONTRAST:  125mL OMNIPAQUE IOHEXOL 300 MG/ML  SOLN  COMPARISON:  Prior CT abdomen and pelvis 02/10/2014  FINDINGS: Lower Chest: Elevation of the left hemidiaphragm. Combination of dependent atelectasis, subpleural reticulation and early honeycombing. Trace right pleural effusion. Incompletely imaged cardiomegaly. Pacer wires present within the right atrium and right ventricle. No pericardial effusion. Unremarkable distal esophagus.  Abdomen: Unremarkable CT appearance of the stomach, duodenum, spleen, and pancreas save for fatty atrophy. Adreniform thickening of the glands bilaterally. Normal hepatic contour and morphology. No discrete hepatic lesion. Gallbladder is unremarkable. No intra or extrahepatic biliary ductal dilatation.  Numerous loops of fluid-filled small bowel throughout the abdomen consistent with small bowel obstruction. Transition point noted in the right lower quadrant below the level of the iliac crest at the distal ileum. Small volume free fluid noted about the distal ileum and edema is noted within the distal ileal mesentery. No evidence of free air. Focal intussusception noted  incidentally in the left upper quadrant.  Unremarkable appearance of the bilateral kidneys. No focal solid lesion, hydronephrosis or nephrolithiasis.  Pelvis: Moderate volume stool in the rectal vault. No free fluid or suspicious adenopathy.  Bones/Soft Tissues: No acute  fracture or aggressive appearing lytic or blastic osseous lesion. Severe advanced multilevel degenerative disc disease. Chronic bilateral L5 pars defects with grade 2 anterolisthesis of L5 on S1. Levoconvex scoliosis.  Vascular: Extensive atherosclerotic vascular calcifications without aneurysmal dilatation. 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 Additionally, there is heavy calcification at the origin of the celiac artery. The IMA appears patent.  IMPRESSION: 1. High-grade distal small-bowel obstruction just proximal to the terminal ileum. Small volume surrounding free fluid and mesenteric edema. No definite bowel ischemia or evidence of free air. 2. 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 The IMA is relatively robust and likely fills the SMA via collateral flow. 3. Focal small bowel intussusception noted incidentally in the left upper quadrant. This is of dubious clinical significance. 4. Advanced multilevel degenerative disc disease with chronic bilateral L5 pars defects and grade 2 anterolisthesis of L5 on S1.   Electronically Signed   By: Jacqulynn Cadet M.D.   On: 12/23/2014 13:57    Anti-infectives: Anti-infectives    None      Assessment/Plan: Impression: Small bowel obstruction, resolved. No need for acute surgical intervention Plan: Agree with advancing diet as tolerated. Will follow peripherally with you.  LOS: 2 days    Andre Holder A 12/25/2014

## 2014-12-25 NOTE — Care Management Note (Addendum)
    Page 1 of 1   12/26/2014     7:50:17 AM CARE MANAGEMENT NOTE 12/26/2014  Patient:  Andre Holder, Andre Holder   Account Number:  [id number]  Date Initiated:  12/25/2014  Documentation initiated by:  Theophilus Kinds  Subjective/Objective Assessment:   Pt admitted from home with SBO. Pt lives with his wife and will return home at discharge. Pt uses a cane and walker when needed. Pt is connected with [facility]. Pts wife stated that she has called the [place] and made them aware that pt is in     Action/Plan:   the hospital. Anticipate discharge within 24 hours. No CM needs noted.   Anticipated DC Date:  12/26/2014   Anticipated DC Plan:  [place]  CM consult      Choice offered to / List presented to:             Status of service:  Completed, signed off Medicare Important Message given?  YES (If response is "NO", the following Medicare IM given date fields will be blank) Date Medicare IM given:  12/26/2014 Medicare IM given by:  Theophilus Kinds Date Additional Medicare IM given:   Additional Medicare IM given by:    Discharge Disposition:  HOME/SELF CARE  Per UR Regulation:    If discussed at Long Length of Stay Meetings, dates discussed:    Comments:  12/26/14 [place], RN BSN CM Pt discharged home today. No CM needs noted.  12/25/14 [place], RN BSN CM

## 2014-12-26 NOTE — Progress Notes (Signed)
Discharged home with instructions given on medications,and follow up visits,patient verbalized understanding. No C/O pain or discomfort noted. Accompanied by staff to an awaiting vehicle.

## 2014-12-26 NOTE — Discharge Summary (Signed)
Physician Discharge Summary  Andre Holder WIO:[id number] DOB: 29-Oct-1922 DOA: 12/23/2014   Admit date: 12/23/2014 Discharge date: 12/26/2014  Discharge Diagnoses:  Principal Problem:   Small bowel obstruction Active Problems:   Hypothyroidism   Aortic valve disorder   Mobitz type II atrioventricular block   PACEMAKER, PERMANENT   Hyponatremia   Chronic systolic congestive heart failure, NYHA class 2   SBO (small bowel obstruction)    Wt Readings from Last 3 Encounters:  12/23/14 182 lb 8.7 oz (82.8 kg)  09/05/14 165 lb 3.2 oz (74.934 kg)  07/25/14 165 lb (74.844 kg)     Hospital Course:  This patient is an 81 year old white male who presented with abdominal pain and vomiting. He was found to have evidence of a small bowel obstruction by CT scan. He was treated initially with IV fluids and an NG tube. He was seen in surgical consultation by Dr. Arnoldo Morale. His NG tube was not felt to be in proper position and was removed after minimal drainage. Bowel sounds improved. He was given sips of clear liquids. He had multiple bowel movements and resolution of his abdominal discomfort. He remained hemodynamically stable. He has chronic systolic heart failure which has been stable on Lasix. This was held will be resumed at discharge.  His diet was advanced to a soft diet the day before discharge. He tolerated this well. He had no nausea or recurrent vomiting with eating.  On the morning of discharge his abdomen is soft and nontender with no evidence of distention. Condition at discharge is much improved. He will be seen in follow-up in office in one week.   Discharge Instructions     Medication List    STOP taking these medications        FISH OIL + D3 1200-1000 MG-UNIT Caps     methocarbamol 750 MG tablet  Commonly known as:  ROBAXIN     OVER THE COUNTER MEDICATION     vitamin C 500 MG tablet  Commonly known as:  ASCORBIC ACID      TAKE these medications        aspirin EC 81  MG tablet  Take 81 mg by mouth daily.     B-complex with vitamin C tablet  Take 1 tablet by mouth daily.     docusate sodium 100 MG capsule  Commonly known as:  COLACE  Take 100 mg by mouth 2 (two) times daily.     finasteride 5 MG tablet  Commonly known as:  PROSCAR  Take 5 mg by mouth at bedtime.     furosemide 40 MG tablet  Commonly known as:  LASIX  Take 1 tablet (40 mg total) by mouth daily.     HYDROcodone-acetaminophen 5-325 MG per tablet  Commonly known as:  NORCO/VICODIN  Take 1 tablet by mouth every 8 (eight) hours as needed for moderate pain or severe pain.     levothyroxine 175 MCG tablet  Commonly known as:  SYNTHROID, LEVOTHROID  Take 175 mcg by mouth every evening.     magnesium oxide 400 MG tablet  Commonly known as:  MAG-OX  Take 400 mg by mouth 2 (two) times daily.     Melatonin 3 MG Tabs  Take 2 tablets by mouth daily.     omeprazole 20 MG capsule  Commonly known as:  PRILOSEC  Take 20 mg by mouth daily.     tamsulosin 0.4 MG Caps capsule  Commonly known as:  FLOMAX  Take 0.4 mg by  mouth 2 (two) times daily.         [name] 12/26/2014

## 2015-01-10 DIAGNOSIS — I2510 Atherosclerotic heart disease of native coronary artery without angina pectoris: Secondary | ICD-10-CM | POA: Diagnosis present

## 2015-01-10 DIAGNOSIS — R0902 Hypoxemia: Secondary | ICD-10-CM | POA: Diagnosis not present

## 2015-01-10 DIAGNOSIS — Z833 Family history of diabetes mellitus: Secondary | ICD-10-CM

## 2015-01-10 DIAGNOSIS — E785 Hyperlipidemia, unspecified: Secondary | ICD-10-CM | POA: Diagnosis present

## 2015-01-10 DIAGNOSIS — Z23 Encounter for immunization: Secondary | ICD-10-CM

## 2015-01-10 DIAGNOSIS — E440 Moderate protein-calorie malnutrition: Secondary | ICD-10-CM | POA: Diagnosis present

## 2015-01-10 DIAGNOSIS — Z8521 Personal history of malignant neoplasm of larynx: Secondary | ICD-10-CM

## 2015-01-10 DIAGNOSIS — I5023 Acute on chronic systolic (congestive) heart failure: Secondary | ICD-10-CM | POA: Diagnosis not present

## 2015-01-10 DIAGNOSIS — E039 Hypothyroidism, unspecified: Secondary | ICD-10-CM | POA: Diagnosis present

## 2015-01-10 DIAGNOSIS — N400 Enlarged prostate without lower urinary tract symptoms: Secondary | ICD-10-CM | POA: Diagnosis present

## 2015-01-10 DIAGNOSIS — Z87891 Personal history of nicotine dependence: Secondary | ICD-10-CM

## 2015-01-10 DIAGNOSIS — Z6820 Body mass index (BMI) 20.0-20.9, adult: Secondary | ICD-10-CM

## 2015-01-10 DIAGNOSIS — Z923 Personal history of irradiation: Secondary | ICD-10-CM

## 2015-01-10 DIAGNOSIS — Z806 Family history of leukemia: Secondary | ICD-10-CM

## 2015-01-10 DIAGNOSIS — E871 Hypo-osmolality and hyponatremia: Secondary | ICD-10-CM | POA: Diagnosis present

## 2015-01-10 DIAGNOSIS — Z823 Family history of stroke: Secondary | ICD-10-CM

## 2015-01-10 DIAGNOSIS — K219 Gastro-esophageal reflux disease without esophagitis: Secondary | ICD-10-CM | POA: Diagnosis present

## 2015-01-10 DIAGNOSIS — Z930 Tracheostomy status: Secondary | ICD-10-CM

## 2015-01-10 DIAGNOSIS — Z96653 Presence of artificial knee joint, bilateral: Secondary | ICD-10-CM | POA: Diagnosis present

## 2015-01-10 DIAGNOSIS — Z85828 Personal history of other malignant neoplasm of skin: Secondary | ICD-10-CM

## 2015-01-11 ENCOUNTER — Emergency Department (HOSPITAL_COMMUNITY): Payer: Medicare HMO

## 2015-01-11 ENCOUNTER — Encounter (HOSPITAL_COMMUNITY): Payer: Self-pay

## 2015-01-11 ENCOUNTER — Other Ambulatory Visit (HOSPITAL_COMMUNITY): Payer: Self-pay

## 2015-01-11 ENCOUNTER — Inpatient Hospital Stay (HOSPITAL_COMMUNITY)
Admission: EM | Admit: 2015-01-11 | Discharge: 2015-01-12 | DRG: 292 | Disposition: A | Discharge status: Home / Self Care | Payer: Medicare HMO | Attending: Internal Medicine | Admitting: Internal Medicine

## 2015-01-11 DIAGNOSIS — Z23 Encounter for immunization: Secondary | ICD-10-CM | POA: Diagnosis not present

## 2015-01-11 DIAGNOSIS — I509 Heart failure, unspecified: Secondary | ICD-10-CM

## 2015-01-11 DIAGNOSIS — K219 Gastro-esophageal reflux disease without esophagitis: Secondary | ICD-10-CM | POA: Diagnosis present

## 2015-01-11 DIAGNOSIS — Z923 Personal history of irradiation: Secondary | ICD-10-CM | POA: Diagnosis not present

## 2015-01-11 DIAGNOSIS — Z87891 Personal history of nicotine dependence: Secondary | ICD-10-CM | POA: Diagnosis not present

## 2015-01-11 DIAGNOSIS — E440 Moderate protein-calorie malnutrition: Secondary | ICD-10-CM | POA: Diagnosis present

## 2015-01-11 DIAGNOSIS — Z806 Family history of leukemia: Secondary | ICD-10-CM | POA: Diagnosis not present

## 2015-01-11 DIAGNOSIS — E039 Hypothyroidism, unspecified: Secondary | ICD-10-CM | POA: Diagnosis present

## 2015-01-11 DIAGNOSIS — Z8521 Personal history of malignant neoplasm of larynx: Secondary | ICD-10-CM | POA: Diagnosis not present

## 2015-01-11 DIAGNOSIS — Z6820 Body mass index (BMI) 20.0-20.9, adult: Secondary | ICD-10-CM | POA: Diagnosis not present

## 2015-01-11 DIAGNOSIS — Z823 Family history of stroke: Secondary | ICD-10-CM | POA: Diagnosis not present

## 2015-01-11 DIAGNOSIS — E871 Hypo-osmolality and hyponatremia: Secondary | ICD-10-CM | POA: Diagnosis present

## 2015-01-11 DIAGNOSIS — R0602 Shortness of breath: Secondary | ICD-10-CM

## 2015-01-11 DIAGNOSIS — I2510 Atherosclerotic heart disease of native coronary artery without angina pectoris: Secondary | ICD-10-CM | POA: Diagnosis present

## 2015-01-11 DIAGNOSIS — Z833 Family history of diabetes mellitus: Secondary | ICD-10-CM | POA: Diagnosis not present

## 2015-01-11 DIAGNOSIS — K625 Hemorrhage of anus and rectum: Secondary | ICD-10-CM

## 2015-01-11 DIAGNOSIS — E785 Hyperlipidemia, unspecified: Secondary | ICD-10-CM | POA: Diagnosis present

## 2015-01-11 DIAGNOSIS — R0902 Hypoxemia: Secondary | ICD-10-CM | POA: Diagnosis present

## 2015-01-11 DIAGNOSIS — Z85828 Personal history of other malignant neoplasm of skin: Secondary | ICD-10-CM | POA: Diagnosis not present

## 2015-01-11 DIAGNOSIS — Z96653 Presence of artificial knee joint, bilateral: Secondary | ICD-10-CM | POA: Diagnosis present

## 2015-01-11 DIAGNOSIS — Z930 Tracheostomy status: Secondary | ICD-10-CM | POA: Diagnosis not present

## 2015-01-11 DIAGNOSIS — R109 Unspecified abdominal pain: Secondary | ICD-10-CM

## 2015-01-11 DIAGNOSIS — I5023 Acute on chronic systolic (congestive) heart failure: Secondary | ICD-10-CM | POA: Diagnosis present

## 2015-01-11 DIAGNOSIS — N400 Enlarged prostate without lower urinary tract symptoms: Secondary | ICD-10-CM | POA: Diagnosis present

## 2015-01-11 LAB — TYPE AND SCREEN
ABO/RH(D): B POS
ANTIBODY SCREEN: NEGATIVE

## 2015-01-11 LAB — COMPREHENSIVE METABOLIC PANEL
ALBUMIN: 3.7 g/dL (ref 3.5–5.0)
ALK PHOS: 78 U/L (ref 38–126)
ALT: 18 U/L (ref 17–63)
ANION GAP: 6 (ref 5–15)
AST: 28 U/L (ref 15–41)
BUN: 26 mg/dL — ABNORMAL HIGH (ref 6–20)
CALCIUM: 8.7 mg/dL — AB (ref 8.9–10.3)
CO2: 25 mmol/L (ref 22–32)
Chloride: 104 mmol/L (ref 101–111)
Creatinine, Ser: 0.79 mg/dL (ref 0.61–1.24)
GFR calc non Af Amer: >60 mL/min (ref >60)
GLUCOSE: 128 mg/dL — AB (ref 70–99)
Potassium: 3.9 mmol/L (ref 3.5–5.1)
SODIUM: 135 mmol/L (ref 135–145)
Total Bilirubin: 0.7 mg/dL (ref 0.3–1.2)
Total Protein: 6.9 g/dL (ref 6.5–8.1)

## 2015-01-11 LAB — CBC
HEMATOCRIT: 31.7 % — AB (ref 39.0–52.0)
Hemoglobin: 11 g/dL — ABNORMAL LOW (ref 13.0–17.0)
MCH: 33.8 pg (ref 26.0–34.0)
MCHC: 34.7 g/dL (ref 30.0–36.0)
MCV: 97.5 fL (ref 78.0–100.0)
Platelets: 212 10*3/uL (ref 150–400)
RBC: 3.25 MIL/uL — ABNORMAL LOW (ref 4.22–5.81)
RDW: 13.1 % (ref 11.5–15.5)
WBC: 7.1 10*3/uL (ref 4.0–10.5)

## 2015-01-11 LAB — TROPONIN I: TROPONIN I: 0.03 ng/mL (ref <0.031)

## 2015-01-11 LAB — BRAIN NATRIURETIC PEPTIDE: B NATRIURETIC PEPTIDE 5: 1853 pg/mL — AB (ref 0.0–100.0)

## 2015-01-11 LAB — POC OCCULT BLOOD, ED: Fecal Occult Bld: POSITIVE — AB

## 2015-01-11 MED ORDER — TAMSULOSIN HCL 0.4 MG PO CAPS
0.4000 mg | ORAL_CAPSULE | Freq: Two times a day (BID) | ORAL | Status: DC
  Administered 2015-01-11 – 2015-01-12 (×3): 0.4 mg via ORAL
  Filled 2015-01-11 (×3): qty 1

## 2015-01-11 MED ORDER — ENSURE ENLIVE PO LIQD: 237.0000 mL | Freq: Two times a day (BID) | ORAL | Status: DC

## 2015-01-11 MED ORDER — ALBUTEROL SULFATE (2.5 MG/3ML) 0.083% IN NEBU
5.0000 mg | INHALATION_SOLUTION | Freq: Once | RESPIRATORY_TRACT | Status: AC
  Administered 2015-01-11: 5 mg via RESPIRATORY_TRACT
  Filled 2015-01-11: qty 6

## 2015-01-11 MED ORDER — ACETAMINOPHEN 650 MG RE SUPP: 650.0000 mg | Freq: Four times a day (QID) | RECTAL | Status: DC | PRN

## 2015-01-11 MED ORDER — FUROSEMIDE 10 MG/ML IJ SOLN
40.0000 mg | Freq: Two times a day (BID) | INTRAMUSCULAR | Status: DC
  Administered 2015-01-11 – 2015-01-12 (×2): 40 mg via INTRAVENOUS
  Filled 2015-01-11 (×2): qty 4

## 2015-01-11 MED ORDER — ROPINIROLE HCL 0.5 MG PO TABS
0.5000 mg | ORAL_TABLET | Freq: Every day | ORAL | Status: DC
  Administered 2015-01-11: 0.5 mg via ORAL
  Filled 2015-01-11: qty 1

## 2015-01-11 MED ORDER — ROPINIROLE HCL 0.25 MG PO TABS
ORAL_TABLET | ORAL | Status: AC
  Filled 2015-01-11: qty 2

## 2015-01-11 MED ORDER — LEVOTHYROXINE SODIUM 75 MCG PO TABS
175.0000 ug | ORAL_TABLET | Freq: Every evening | ORAL | Status: DC
  Administered 2015-01-11: 175 ug via ORAL
  Filled 2015-01-11 (×2): qty 1

## 2015-01-11 MED ORDER — PNEUMOCOCCAL VAC POLYVALENT 25 MCG/0.5ML IJ INJ
0.5000 mL | INJECTION | INTRAMUSCULAR | Status: DC
  Filled 2015-01-11: qty 0.5

## 2015-01-11 MED ORDER — IOHEXOL 300 MG/ML  SOLN
100.0000 mL | Freq: Once | INTRAMUSCULAR | Status: AC | PRN
  Administered 2015-01-11: 100 mL via INTRAVENOUS

## 2015-01-11 MED ORDER — PANTOPRAZOLE SODIUM 40 MG PO TBEC
40.0000 mg | DELAYED_RELEASE_TABLET | Freq: Every day | ORAL | Status: DC
  Administered 2015-01-11 – 2015-01-12 (×2): 40 mg via ORAL
  Filled 2015-01-11 (×2): qty 1

## 2015-01-11 MED ORDER — SODIUM CHLORIDE 0.9 % IJ SOLN
3.0000 mL | Freq: Two times a day (BID) | INTRAMUSCULAR | Status: DC
  Administered 2015-01-11 – 2015-01-12 (×3): 3 mL via INTRAVENOUS

## 2015-01-11 MED ORDER — SODIUM CHLORIDE 0.9 % IJ SOLN: 3.0000 mL | INTRAMUSCULAR | Status: DC | PRN

## 2015-01-11 MED ORDER — IPRATROPIUM BROMIDE 0.02 % IN SOLN
0.5000 mg | Freq: Once | RESPIRATORY_TRACT | Status: AC
  Administered 2015-01-11: 0.5 mg via RESPIRATORY_TRACT
  Filled 2015-01-11: qty 2.5

## 2015-01-11 MED ORDER — IOHEXOL 300 MG/ML  SOLN
50.0000 mL | Freq: Once | INTRAMUSCULAR | Status: AC | PRN
  Administered 2015-01-11: 50 mL via ORAL

## 2015-01-11 MED ORDER — FINASTERIDE 5 MG PO TABS
5.0000 mg | ORAL_TABLET | Freq: Every day | ORAL | Status: DC
  Administered 2015-01-11: 5 mg via ORAL
  Filled 2015-01-11 (×4): qty 1

## 2015-01-11 MED ORDER — FUROSEMIDE 40 MG PO TABS
40.0000 mg | ORAL_TABLET | Freq: Every day | ORAL | Status: DC
  Administered 2015-01-11: 40 mg via ORAL
  Filled 2015-01-11: qty 1

## 2015-01-11 MED ORDER — ROPINIROLE HCL 1 MG PO TABS
5.0000 mg | ORAL_TABLET | Freq: Every day | ORAL | Status: DC
  Administered 2015-01-11: 5 mg via ORAL
  Filled 2015-01-11 (×4): qty 5

## 2015-01-11 MED ORDER — ROPINIROLE HCL 0.25 MG PO TABS: 0.2500 mg | ORAL_TABLET | Freq: Every day | ORAL | Status: DC

## 2015-01-11 MED ORDER — SODIUM CHLORIDE 0.9 % IV SOLN: 250.0000 mL | INTRAVENOUS | Status: DC | PRN

## 2015-01-11 MED ORDER — ACETAMINOPHEN 325 MG PO TABS: 650.0000 mg | ORAL_TABLET | Freq: Four times a day (QID) | ORAL | Status: DC | PRN

## 2015-01-11 NOTE — ED Provider Notes (Addendum)
CSN: [id number]     Arrival date & time 01/10/15  2350 History   First MD Initiated Contact with Patient 01/11/15 0036    This chart was scribed for Veryl Speak, MD by Terressa Koyanagi, ED Scribe. This patient was seen in room APA04/[facility]04 and the patient's care was started at 12:37 AM.  Chief Complaint  Patient presents with  . GI Bleeding   The history is provided by a relative. No language interpreter was used.   PCP: Asencion Noble, MD HPI Comments: Andre Holder is a 81 y.o. male, with PMH noted below and who is nonverbal, accompanied by his nephew, who presents to the Emergency Department complaining of red blood in his stool with associated lower abd pain onset today. Per nephew, pt was admitted to the hospital a couple of weeks ago for a bowel obstruction; pt returned home from the hospital 1.5 weeks ago. Pt's nephew denies fever, chills, n/v.   During exam pt's BP is 111/75.   Past Medical History  Diagnosis Date  . Arteriosclerotic cardiovascular disease (ASCVD)     Nonobstructive; 09/2008 50% proximal and 40% mid LAD; 25% circumflex; 30% RCA; mild global LV dysfunction with EF of 45%. No aortic stenosis.  . Mild aortic stenosis     not documented at catheterization; verified by echo in 2011  . Peripheral vascular disease     With a 70% innominate artery stenosis and nonobstructive carotid stenosis  . Hypothyroidism   . Degenerative joint disease     s/p bilateral TKR  . Mobitz (type) II atrioventricular block     With bradycardia; Medtronic pacemaker implanted in 09/2008  . Tobacco abuse, in remission     Remote  . GERD (gastroesophageal reflux disease)   . Hyperlipidemia     Lipid profile in 04/2010:115, 98, 43, 52.  . Weight loss     50 pounds between 1991 and 2011  . 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 Adrenal hyperplasia     Stable on serial imaging  . Anemia     minimal in 2011 with hemoglobin of 12.2 and high normal MCV  .  Borderline hypertension     Normal CMet in 2011  . Cancer of larynx     laryngectomy in 1988; postoperative radiation therapy  . Skin cancer    Past Surgical History  Procedure Laterality Date  . Laryngectomy  1988    S/P laryngectomy and radiation therapy  . Appendectomy  1973  . Knee arthroscopy      Left  . Total knee arthroplasty      Bilateral, 19 years ago  . Cataract extraction, bilateral    . Decompression facial nerve      Right median  . Pacemaker insertion    . Insert / replace / remove pacemaker     Family History  Problem Relation Age of Onset  . Stroke Mother   . Leukemia Father   . Colon cancer Neg Hx   . Liver disease Neg Hx   . GI problems Neg Hx   . Stroke Other   . Diabetes Other    History  Substance Use Topics  . Smoking status: Former Smoker    Types: Cigarettes  . Smokeless tobacco: Former Systems developer    Quit date: 09/08/1982     Comment: Quit 30 years  . Alcohol Use: No    Review of Systems A complete 10 system review of systems  was obtained and all systems are negative except as noted in the HPI and PMH.   Allergies  Review of patient's allergies indicates no known allergies.  Home Medications   Prior to Admission medications   Medication Sig Start Date End Date Taking? Authorizing Provider  aspirin EC 81 MG tablet Take 81 mg by mouth daily.    Historical Provider, MD  B Complex-C (B-COMPLEX WITH VITAMIN C) tablet Take 1 tablet by mouth daily.    Historical Provider, MD  docusate sodium (COLACE) 100 MG capsule Take 100 mg by mouth 2 (two) times daily.    Historical Provider, MD  finasteride (PROSCAR) 5 MG tablet Take 5 mg by mouth at bedtime.    Historical Provider, MD  furosemide (LASIX) 40 MG tablet Take 1 tablet (40 mg total) by mouth daily. Patient taking differently: Take 20 mg by mouth daily.  09/05/14   Asencion Noble, MD  HYDROcodone-acetaminophen (NORCO/VICODIN) 5-325 MG per tablet Take 1 tablet by mouth every 8 (eight) hours as needed  for moderate pain or severe pain.  07/15/13   Historical Provider, MD  levothyroxine (SYNTHROID, LEVOTHROID) 175 MCG tablet Take 175 mcg by mouth every evening.     Historical Provider, MD  magnesium oxide (MAG-OX) 400 MG tablet Take 400 mg by mouth 2 (two) times daily.    Historical Provider, MD  Melatonin 3 MG TABS Take 2 tablets by mouth daily.    Historical Provider, MD  omeprazole (PRILOSEC) 20 MG capsule Take 20 mg by mouth daily.    Historical Provider, MD  Tamsulosin HCl (FLOMAX) 0.4 MG CAPS Take 0.4 mg by mouth 2 (two) times daily.     Historical Provider, MD   Triage Vitals: BP 99/64 mmHg  Pulse 78  Temp(Src) 98.4 F (36.9 C) (Oral)  Resp 16  Ht 6\' 2"  (1.88 m)  Wt 160 lb (72.576 kg)  BMI 20.53 kg/m2  SpO2 97% Physical Exam  Constitutional: He is oriented to person, place, and time. He appears well-developed and well-nourished. No distress.  HENT:  Head: Normocephalic and atraumatic.  Eyes: Conjunctivae and EOM are normal.  Neck: Neck supple.  Cardiovascular: Normal rate.   Pulmonary/Chest: Effort normal. No respiratory distress.  Abdominal: There is tenderness in the right lower quadrant. There is no rebound and no guarding.  Genitourinary:  Chaperone was present. Patient with no pain around the rectal area. There are no external fissures noted. No induration of the skin or swelling. No external hemorrhoids seen. Patient able to tolerate examination. I was able to feel the first 2-3cm of the rectum digitally without gross abnormality. There is no gross blood.  NO signs of perirectal abscess. Brown stool noted.     Musculoskeletal: Normal range of motion.  Neurological: He is alert and oriented to person, place, and time.  Skin: Skin is warm and dry.  Nursing note and vitals reviewed.   ED Course  Procedures (including critical care time) DIAGNOSTIC STUDIES: Oxygen Saturation is 97% on RA, nl by my interpretation.    COORDINATION OF CARE: 12:42 AM-Discussed treatment  plan which includes  (CXR, CBC panel, CMP, UA) with pt at bedside and pt agreed to plan.   Labs Review Labs Reviewed  CBC  COMPREHENSIVE METABOLIC PANEL  POC OCCULT BLOOD, ED  TYPE AND SCREEN    Imaging Review No results found.  ED ECG REPORT   Date: 01/11/2015  Rate: 69  Rhythm: normal sinus rhythm  QRS Axis: indeterminate  Intervals: normal  ST/T Wave abnormalities:  nonspecific T wave changes  Conduction Disutrbances:left bundle branch block  Narrative Interpretation:   Old EKG Reviewed: none available  I have personally reviewed the EKG tracing and agree with the computerized printout as noted.   MDM   Final diagnoses:  None    Patient is an 81 year old male brought for evaluation of rectal bleeding. Is also complaining of lower abdominal discomfort and is tender in the left lower quadrant and right lower quadrant. His exam reveals no gross blood, however slightly heme positive stools.  Workup reveals no leukocytosis, essentially unremarkable electrolytes, and CT of the abdomen and pelvis which reveals only a feculent action. Patient had a large bowel movement while in the emergency department and I believe this has resolved. Shortly after returning from the CT machine, he was found to be hypoxic and tachypneic with oxygen saturations in the 70s. He was then placed on a trach collar and his oxygen saturations improved. I then added on a BNP and chest x-ray, both which were reflective of congestive heart failure. Why this started so abruptly, I am uncertain, however the patient will require admission due to his hypoxia.  I personally performed the services described in this documentation, which was scribed in my presence. The recorded information has been reviewed and is accurate.      Veryl Speak, MD 01/11/15 1102  Veryl Speak, MD 01/11/15 [phone]

## 2015-01-11 NOTE — ED Notes (Signed)
Pt reports bright red blood in his stool all day with lower abd pain

## 2015-01-11 NOTE — ED Notes (Signed)
Pt's o2 level dropped to 65% on room air. Pt was repositioned in bed and EdP notified. Resp called and trach collar applied.

## 2015-01-11 NOTE — Progress Notes (Signed)
Subjective: Andre Holder was admitted last night by the hospitalist after presenting with abdominal pain and constipation. He subsequently had shortness of breath and a drop in his oxygen saturation. X-ray was consistent with CHF. He had been admitted last month with a partial small bowel obstruction which responded well to conservative therapy. He had a CT last night which revealed a possible ileus. He subsequently had a large bowel movement and is now abdominal pain free. He was treated with Lasix 40 mg IV once. He is feeling much better now and is not feeling short of breath this afternoon.  Objective: Vital signs in last 24 hours: Filed Vitals:   01/11/15 0430 01/11/15 0600 01/11/15 0747 01/11/15 1434  BP: 88/58 107/74 106/67 110/74  Pulse: 59 66 68 64  Temp:   98.7 F (37.1 C) 98.4 F (36.9 C)  TempSrc:   Oral Oral  Resp:  13 17 16   Height:   6\' 2"  (1.88 m)   Weight:   159 lb 9.6 oz (72.394 kg)   SpO2: 95% 98% 96% 97%   Weight change:   Intake/Output Summary (Last 24 hours) at 01/11/15 1800 Last data filed at 01/11/15 1700  Gross per 24 hour  Intake    240 ml  Output   1800 ml  Net  -1560 ml    Physical Exam: Alert. No distress. Lungs reveal bilateral crackles. Heart regular with a grade 2 systolic murmur. Abdomen is soft and nondistended and nontender. Extremities reveal no edema.  Lab Results:    Results for orders placed or performed during the hospital encounter of 01/11/15 (from the past 24 hour(s))  POC occult blood, ED Provider will collect     Status: Abnormal   Collection Time: 01/11/15 12:46 AM  Result Value Ref Range   Fecal Occult Bld POSITIVE (A) NEGATIVE  CBC     Status: Abnormal   Collection Time: 01/11/15  1:03 AM  Result Value Ref Range   WBC 7.1 4.0 - 10.5 K/uL   RBC 3.25 (L) 4.22 - 5.81 MIL/uL   Hemoglobin 11.0 (L) 13.0 - 17.0 g/dL   HCT 31.7 (L) 39.0 - 52.0 %   MCV 97.5 78.0 - 100.0 fL   MCH 33.8 26.0 - 34.0 pg   MCHC 34.7 30.0 - 36.0 g/dL   RDW 13.1  11.5 - 15.5 %   Platelets 212 150 - 400 K/uL  Comprehensive metabolic panel     Status: Abnormal   Collection Time: 01/11/15  1:03 AM  Result Value Ref Range   Sodium 135 135 - 145 mmol/L   Potassium 3.9 3.5 - 5.1 mmol/L   Chloride 104 101 - 111 mmol/L   CO2 25 22 - 32 mmol/L   Glucose, Bld 128 (H) 70 - 99 mg/dL   BUN 26 (H) 6 - 20 mg/dL   Creatinine, Ser 0.79 0.61 - 1.24 mg/dL   Calcium 8.7 (L) 8.9 - 10.3 mg/dL   Total Protein 6.9 6.5 - 8.1 g/dL   Albumin 3.7 3.5 - 5.0 g/dL   AST 28 15 - 41 U/L   ALT 18 17 - 63 U/L   Alkaline Phosphatase 78 38 - 126 U/L   Total Bilirubin 0.7 0.3 - 1.2 mg/dL   GFR calc non Af Amer >60 >60 mL/min   GFR calc Af Amer >60 >60 mL/min   Anion gap 6 5 - 15  Brain natriuretic peptide     Status: Abnormal   Collection Time: 01/11/15  1:03 AM  Result  Value Ref Range   B Natriuretic Peptide 1853.0 (H) 0.0 - 100.0 pg/mL  Troponin I     Status: None   Collection Time: 01/11/15  1:03 AM  Result Value Ref Range   Troponin I 0.03 <0.031 ng/mL  Type and screen     Status: None   Collection Time: 01/11/15  1:10 AM  Result Value Ref Range   ABO/RH(D) B POS    Antibody Screen NEG    Sample Expiration 01/14/2015      ABGS No results for input(s): PHART, PO2ART, TCO2, HCO3 in the last 72 hours.  Invalid input(s): PCO2 CULTURES No results found for this or any previous visit (from the past 240 hour(s)). Studies/Results: Ct Abdomen Pelvis W Contrast  01/11/2015   CLINICAL DATA:  Blood in stool. Lower abdominal pain. Recent bowel obstruction on 12/23/2014  EXAM: CT ABDOMEN AND PELVIS WITH CONTRAST  TECHNIQUE: Multidetector CT imaging of the abdomen and pelvis was performed using the standard protocol following bolus administration of intravenous contrast.  CONTRAST:  37mL OMNIPAQUE IOHEXOL 300 MG/ML SOLN, 183mL OMNIPAQUE IOHEXOL 300 MG/ML SOLN  COMPARISON:  CT 12/23/2014  FINDINGS: Lower chest: There is interlobular septal thickening and architectural distortion  at lung bases. Heart is enlarged.  Hepatobiliary: There is reflux of contrast into the hepatic veins which can indicate right heart failure. The liver appears normal. There is sludge within the gallbladder.  Pancreas: Pancreas is normal. No ductal dilatation. No pancreatic inflammation.  Spleen: Normal spleen  Adrenals/urinary tract: Benign adenoma of the left adrenal gland. Right adrenal glands normal. There is bilateral renal cortical thinning. No renal obstruction. The bladder is distended. No hydroureter.  Stomach/Bowel: The stomach is normal. There is slow progression of the oral contrast through the small bowel. No evidence of high-grade obstruction. There is no significantly dilated loops of small bowel. There is fluid in the right colon. There is a large stool ball in the rectum measuring 10 cm x 8.9 cm by 12 cm. This is increased from comparison exam. No pneumatosis or intraperitoneal free air.  Vascular/Lymphatic: Dense calcification aorta and its branches. No retroperitoneal periportal lymphadenopathy.  Reproductive: Prostate gland is normal.  Musculoskeletal: No aggressive osseous lesion.  Other: No free fluid.  IMPRESSION: 1. Slow progression of oral contrast suggest ileus. No evidence of high-grade obstruction. 2. Large stool ball in the rectum is new from prior. Considered disimpaction or enema. 3. Interstitial edema of the lung bases and cardiomegaly,   Electronically Signed   By: Suzy Bouchard M.D.   On: 01/11/2015 02:25   Dg Chest Port 1 View  01/11/2015   CLINICAL DATA:  Dyspnea and cough  EXAM: PORTABLE CHEST - 1 VIEW  COMPARISON:  12/23/2014  FINDINGS: There is marked cardiomegaly. There is unchanged marked left hemidiaphragm elevation. There are intact appearances of the transvenous leads. There is moderate vascular and interstitial congestion. There is ground-glass opacity in the central and basilar regions. The findings likely represent congestive heart failure.  IMPRESSION: Probable  congestive heart failure.   Electronically Signed   By: Andreas Newport M.D.   On: 01/11/2015 05:22   Micro Results: No results found for this or any previous visit (from the past 240 hour(s)). Studies/Results: Ct Abdomen Pelvis W Contrast  01/11/2015   CLINICAL DATA:  Blood in stool. Lower abdominal pain. Recent bowel obstruction on 12/23/2014  EXAM: CT ABDOMEN AND PELVIS WITH CONTRAST  TECHNIQUE: Multidetector CT imaging of the abdomen and pelvis was performed using the standard protocol  following bolus administration of intravenous contrast.  CONTRAST:  41mL OMNIPAQUE IOHEXOL 300 MG/ML SOLN, 159mL OMNIPAQUE IOHEXOL 300 MG/ML SOLN  COMPARISON:  CT 12/23/2014  FINDINGS: Lower chest: There is interlobular septal thickening and architectural distortion at lung bases. Heart is enlarged.  Hepatobiliary: There is reflux of contrast into the hepatic veins which can indicate right heart failure. The liver appears normal. There is sludge within the gallbladder.  Pancreas: Pancreas is normal. No ductal dilatation. No pancreatic inflammation.  Spleen: Normal spleen  Adrenals/urinary tract: Benign adenoma of the left adrenal gland. Right adrenal glands normal. There is bilateral renal cortical thinning. No renal obstruction. The bladder is distended. No hydroureter.  Stomach/Bowel: The stomach is normal. There is slow progression of the oral contrast through the small bowel. No evidence of high-grade obstruction. There is no significantly dilated loops of small bowel. There is fluid in the right colon. There is a large stool ball in the rectum measuring 10 cm x 8.9 cm by 12 cm. This is increased from comparison exam. No pneumatosis or intraperitoneal free air.  Vascular/Lymphatic: Dense calcification aorta and its branches. No retroperitoneal periportal lymphadenopathy.  Reproductive: Prostate gland is normal.  Musculoskeletal: No aggressive osseous lesion.  Other: No free fluid.  IMPRESSION: 1. Slow progression of  oral contrast suggest ileus. No evidence of high-grade obstruction. 2. Large stool ball in the rectum is new from prior. Considered disimpaction or enema. 3. Interstitial edema of the lung bases and cardiomegaly,   Electronically Signed   By: Suzy Bouchard M.D.   On: 01/11/2015 02:25   Dg Chest Port 1 View  01/11/2015   CLINICAL DATA:  Dyspnea and cough  EXAM: PORTABLE CHEST - 1 VIEW  COMPARISON:  12/23/2014  FINDINGS: There is marked cardiomegaly. There is unchanged marked left hemidiaphragm elevation. There are intact appearances of the transvenous leads. There is moderate vascular and interstitial congestion. There is ground-glass opacity in the central and basilar regions. The findings likely represent congestive heart failure.  IMPRESSION: Probable congestive heart failure.   Electronically Signed   By: Andreas Newport M.D.   On: 01/11/2015 05:22   Medications:  I have reviewed the patient's current medications Scheduled Meds: . [START ON 01/12/2015] feeding supplement (ENSURE ENLIVE)  237 mL Oral BID BM  . finasteride  5 mg Oral QHS  . furosemide  40 mg Intravenous Q12H  . levothyroxine  175 mcg Oral QPM  . pantoprazole  40 mg Oral Daily  . ropinirole  5 mg Oral QHS  . sodium chloride  3 mL Intravenous Q12H  . tamsulosin  0.4 mg Oral BID   Continuous Infusions:  PRN Meds:.sodium chloride, acetaminophen **OR** acetaminophen, sodium chloride   Assessment/Plan: #1. Abdominal pain/constipation. Resolved. #2. CHF. Continue IV Lasix. #3. Malnutrition. Albumin 3.7. Continue working on improving oral intake. #4. Heme positive stool. Hemoglobin is 11 with an MCV of 97. #5. BPH. He was unable to void well at home. Foley catheter in place. Active Problems:   CHF exacerbation   Malnutrition of moderate degree     LOS: 0 days   Terita Hejl 01/11/2015, 6:00 PM

## 2015-01-11 NOTE — H&P (Addendum)
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 Today was brought to the hospital for abdominal pain. Patient has not been able to move his bowels for a few days. There was also question of possible red colored stool. FOBT was positive in the ED. Patient went for CT scan of the abdomen which showed fecal impaction, but patient had last bowel movement after he came back from CT. Patient also became very hypoxic with O2 sats dropping to 70s requiring oxygen. Patient has a tracheostomy in place after patient had cancer of larynx with laryngectomy in 1988. Chest x-ray showed CHF and patient was given Lasix in the ED. Patient denies chest pain, no nausea vomiting or diarrhea. No fever. Patient was unable to urinate so a Foley catheter was placed  in the ED  Allergies:  No Known Allergies    Past Medical History  Diagnosis Date  . Arteriosclerotic cardiovascular disease (ASCVD)     Nonobstructive; 09/2008 50% proximal and 40% mid LAD; 25% circumflex; 30% RCA; mild global LV dysfunction with EF of 45%. No aortic stenosis.  . Mild aortic stenosis     not documented at catheterization; verified by echo in 2011  . Peripheral vascular disease     With a 70% innominate artery stenosis and nonobstructive carotid stenosis  . Hypothyroidism   . Degenerative joint disease     s/p bilateral TKR  . Mobitz (type) II atrioventricular block     With bradycardia; Medtronic pacemaker implanted in 09/2008  . Tobacco abuse, in  remission     Remote  . GERD (gastroesophageal reflux disease)   . Hyperlipidemia     Lipid profile in 04/2010:115, 98, 43, 52.  . Weight loss     50 pounds between 1991 and 2011  . 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 Adrenal hyperplasia     Stable on serial imaging  . Anemia     minimal in 2011 with hemoglobin of 12.2 and high normal MCV  . Borderline hypertension     Normal CMet in 2011  . Cancer of larynx     laryngectomy in 1988; postoperative radiation therapy  . Skin cancer     Past Surgical History  Procedure Laterality Date  . Laryngectomy  1988    S/P laryngectomy and radiation therapy  . Appendectomy  1973  . Knee arthroscopy      Left  . Total knee arthroplasty      Bilateral, 19 years ago  . Cataract extraction, bilateral    . Decompression facial nerve      Right median  . Pacemaker insertion    . Insert / replace / remove pacemaker      Prior to Admission medications   Medication Sig Start Date End Date Taking? Authorizing Provider  ropinirole (REQUIP) 5 MG tablet Take 5 mg by mouth at bedtime.   Yes Historical Provider, MD  aspirin EC 81 MG tablet Take 81 mg by mouth daily.    Historical Provider, MD  B Complex-C (B-COMPLEX WITH VITAMIN C) tablet Take 1  tablet by mouth daily.    Historical Provider, MD  docusate sodium (COLACE) 100 MG capsule Take 100 mg by mouth 2 (two) times daily.    Historical Provider, MD  finasteride (PROSCAR) 5 MG tablet Take 5 mg by mouth at bedtime.    Historical Provider, MD  furosemide (LASIX) 40 MG tablet Take 1 tablet (40 mg total) by mouth daily. Patient taking differently: Take 20 mg by mouth daily.  09/05/14   Asencion Noble, MD  HYDROcodone-acetaminophen (NORCO/VICODIN) 5-325 MG per tablet Take 1 tablet by mouth every 8 (eight) hours as needed for moderate pain or severe pain.  07/15/13   Historical Provider, MD  levothyroxine (SYNTHROID, LEVOTHROID) 175 MCG tablet Take 175 mcg by  mouth every evening.     Historical Provider, MD  magnesium oxide (MAG-OX) 400 MG tablet Take 400 mg by mouth 2 (two) times daily.    Historical Provider, MD  Melatonin 3 MG TABS Take 2 tablets by mouth daily.    Historical Provider, MD  omeprazole (PRILOSEC) 20 MG capsule Take 20 mg by mouth daily.    Historical Provider, MD  Tamsulosin HCl (FLOMAX) 0.4 MG CAPS Take 0.4 mg by mouth 2 (two) times daily.     Historical Provider, MD    Social History:  reports that he has quit smoking. His smoking use included Cigarettes. He quit smokeless tobacco use about 32 years ago. He reports that he does not drink alcohol or use illicit drugs.  Family History  Problem Relation Age of Onset  . Stroke Mother   . Leukemia Father   . Colon cancer Neg Hx   . Liver disease Neg Hx   . GI problems Neg Hx   . Stroke Other   . Diabetes Other      All the positives are listed in BOLD  Review of Systems:  HEENT: Headache, blurred vision, runny nose, sore throat Neck: Hypothyroidism, hyperthyroidism,,lymphadenopathy Chest : Shortness of breath, history of COPD, Asthma Heart : Chest pain, history of coronary arterey disease GI:  Nausea, vomiting, diarrhea, constipation, GERD GU: Dysuria, urgency, frequency of urination, hematuria Neuro: Stroke, seizures, syncope Psych: Depression, anxiety, hallucinations   Physical Exam: Blood pressure 107/74, pulse 66, temperature 98.4 F (36.9 C), temperature source Oral, resp. rate 13, height 6\' 2"  (1.88 m), weight 72.576 kg (160 lb), SpO2 98 %. Constitutional:   Patient is a well-developed and well-nourished male* in no acute distress and cooperative with exam. Head: Normocephalic and atraumatic Mouth: Mucus membranes moist Eyes: PERRL, EOMI, conjunctivae normal Neck: Supple, No Thyromegaly, tracheostomy  Cardiovascular: RRR, S1 normal, S2 normal Pulmonary/Chest: Bibasilar crackles Abdominal: Soft. Non-tender, non-distended, bowel sounds are normal, no masses,  organomegaly, or guarding present.  Neurological: A&O x3, Strength is normal and symmetric bilaterally, cranial nerve II-XII are grossly intact, no focal motor deficit, sensory intact to light touch bilaterally.  Extremities : No Cyanosis, Clubbing or Edema  Labs on Admission:  Basic Metabolic Panel:  Recent Labs Lab 01/11/15 0103  NA 135  K 3.9  CL 104  CO2 25  GLUCOSE 128*  BUN 26*  CREATININE 0.79  CALCIUM 8.7*   Liver Function Tests:  Recent Labs Lab 01/11/15 0103  AST 28  ALT 18  ALKPHOS 78  BILITOT 0.7  PROT 6.9  ALBUMIN 3.7   No results for input(s): LIPASE, AMYLASE in the last 168 hours. No results for input(s): AMMONIA in the last 168 hours. CBC:  Recent Labs Lab 01/11/15 0103  WBC 7.1  HGB  11.0*  HCT 31.7*  MCV 97.5  PLT 212   Cardiac Enzymes:  Recent Labs Lab 01/11/15 0103  TROPONINI 0.03    BNP (last 3 results)  Recent Labs  09/02/14 2207 12/23/14 1018 01/11/15 0103  BNP 357.0* 1166.0* 1853.0*    ProBNP (last 3 results)  Recent Labs  07/18/14 0002  PROBNP 1363.0*    CBG: No results for input(s): GLUCAP in the last 168 hours.  Radiological Exams on Admission: Ct Abdomen Pelvis W Contrast  01/11/2015   CLINICAL DATA:  Blood in stool. Lower abdominal pain. Recent bowel obstruction on 12/23/2014  EXAM: CT ABDOMEN AND PELVIS WITH CONTRAST  TECHNIQUE: Multidetector CT imaging of the abdomen and pelvis was performed using the standard protocol following bolus administration of intravenous contrast.  CONTRAST:  38mL OMNIPAQUE IOHEXOL 300 MG/ML SOLN, 156mL OMNIPAQUE IOHEXOL 300 MG/ML SOLN  COMPARISON:  CT 12/23/2014  FINDINGS: Lower chest: There is interlobular septal thickening and architectural distortion at lung bases. Heart is enlarged.  Hepatobiliary: There is reflux of contrast into the hepatic veins which can indicate right heart failure. The liver appears normal. There is sludge within the gallbladder.  Pancreas: Pancreas is normal.  No ductal dilatation. No pancreatic inflammation.  Spleen: Normal spleen  Adrenals/urinary tract: Benign adenoma of the left adrenal gland. Right adrenal glands normal. There is bilateral renal cortical thinning. No renal obstruction. The bladder is distended. No hydroureter.  Stomach/Bowel: The stomach is normal. There is slow progression of the oral contrast through the small bowel. No evidence of high-grade obstruction. There is no significantly dilated loops of small bowel. There is fluid in the right colon. There is a large stool ball in the rectum measuring 10 cm x 8.9 cm by 12 cm. This is increased from comparison exam. No pneumatosis or intraperitoneal free air.  Vascular/Lymphatic: Dense calcification aorta and its branches. No retroperitoneal periportal lymphadenopathy.  Reproductive: Prostate gland is normal.  Musculoskeletal: No aggressive osseous lesion.  Other: No free fluid.  IMPRESSION: 1. Slow progression of oral contrast suggest ileus. No evidence of high-grade obstruction. 2. Large stool ball in the rectum is new from prior. Considered disimpaction or enema. 3. Interstitial edema of the lung bases and cardiomegaly,   Electronically Signed   By: Suzy Bouchard M.D.   On: 01/11/2015 02:25   Dg Chest Port 1 View  01/11/2015   CLINICAL DATA:  Dyspnea and cough  EXAM: PORTABLE CHEST - 1 VIEW  COMPARISON:  12/23/2014  FINDINGS: There is marked cardiomegaly. There is unchanged marked left hemidiaphragm elevation. There are intact appearances of the transvenous leads. There is moderate vascular and interstitial congestion. There is ground-glass opacity in the central and basilar regions. The findings likely represent congestive heart failure.  IMPRESSION: Probable congestive heart failure.   Electronically Signed   By: Andreas Newport M.D.   On: 01/11/2015 05:22    EKG: Independently reviewed. Junctional rhythm, LVH   Assessment/Plan Active Problems:   CHF exacerbation     Constipation  CHF exacerbation Will admit the patient in telemetry, continue Lasix 40 mg by mouth daily. Patient received IV Lasix in the ED and has a Foley catheter in place.   Constipation  Resolved Patient had large bowel movement in the ED.   ? Lower GI bleed FOBT is positive. Hemoglobin is stable, will check h&H q  8 hr.  Code status: Full code  Family discussion: Admission, patients condition and plan of care including tests being ordered have been discussed with the  patient and his nephew at bedside* who indicate understanding and agree with the plan and Code Status.   Time Spent on Admission: 55 min [facility] Pager: [phone] 01/11/2015, 6:30 AM  If 7PM-7AM, please contact night-coverage  [email or web address]  Password [email or web address]

## 2015-01-11 NOTE — Progress Notes (Signed)
INITIAL NUTRITION ASSESSMENT Pt meets criteria for MODERATE MALNUTRITION in the context of Chronic illness as evidenced by estimated oral intake that meets <75% of needs for =/<1 month and mild muscle wasting. DOCUMENTATION CODES Per approved criteria  -Non-severe (moderate) malnutrition in the context of chronic illness   INTERVENTION: Ensure Enlive po BID, each supplement provides 350 kcal and 20 grams of protein  Snx per pt request  NUTRITION DIAGNOSIS: Inadequate oral intake related to decrease in appetite as evidenced by loss of 10 pounds in six months.   Goal: Pt to meet >/= 90% of their estimated nutrition needs   Monitor:  Oral intake, snack/supp preference, labs  Reason for Assessment: MST  81 y.o. male  Admitting Dx: <principal problem not specified>  ASSESSMENT: 81 y/o PMHx significant for CVD, PVD, hypothyroisism, Tobacco abuse, Gerd, HLD, wt loss, Cancer of larynx and skin s/p largygectomy. Pt has tracheostomy. Pt admits with abdominal pain  It was slightly hard to communicate with patient as he is very HOH. It is also sometimes hard to answer him even though he uses an electro-larynx.  Pt reports a decrease in appetite recently, though he is unsure why. He states he usually eats well, over 3 meals a day, and also supplements his diet with ensure.   Pt states he has a normal wt range of 173-185. He has insignificant wt loss. I question his recorded height. He didn't look as tall as 6\' 2"  and does not look as thin as his BMI would suggest. Will ask him next time I visit.   He accpeted snack offer and was Okay with ensure as well. He also requested ginger ale  Nutrition Focused Physical Exam: Muscle wasting may be age related vs result of malnutriiton  Subcutaneous Fat:  Orbital Region: age appropriate Upper Arm Region: age appropriate Thoracic and Lumbar Region: n/a  Muscle:  Temple Region: Mild muscle loss Clavicle Bone Region: mild muscle loss Clavicle  and Acromion Bone Region: mild muscle loss Scapular Bone Region: n/a Dorsal Hand: n/a Patellar Region: well nourished Anterior Thigh Region: well nourished Posterior Calf Region: well nourished  Edema: none   Height: Ht Readings from Last 1 Encounters:  01/11/15 6\' 2"  (1.88 m)    Weight: Wt Readings from Last 1 Encounters:  01/11/15 159 lb 9.6 oz (72.394 kg)    Ideal Body Weight: 190 lbs  % Ideal Body Weight: 84%  Wt Readings from Last 10 Encounters:  01/11/15 159 lb 9.6 oz (72.394 kg)  12/23/14 182 lb 8.7 oz (82.8 kg)  09/05/14 165 lb 3.2 oz (74.934 kg)  07/25/14 165 lb (74.844 kg)  07/17/14 170 lb (77.111 kg)  08/26/13 182 lb 12.2 oz (82.9 kg)  05/04/13 185 lb (83.915 kg)  08/25/12 176 lb (79.833 kg)  05/15/12 176 lb 2.4 oz (79.9 kg)  05/03/12 183 lb (83.008 kg)  Loss of 10 pounds x 6 months  Usual Body Weight: 173-185  % Usual Body Weight: 92%  BMI:  Body mass index is 20.48 kg/(m^2).  Estimated Nutritional Needs: Kcal: 1800-1950 (25-27 kcal/kg) Protein: 72-87 g Pro (1-1.2 g/kg) Fluid: 1.8 -2 liters  Skin: WDL  Diet Order: Diet Heart Room service appropriate?: Yes; Fluid consistency:: Thin  EDUCATION NEEDS: -No education needs identified at this time   Intake/Output Summary (Last 24 hours) at 01/11/15 1554 Last data filed at 01/11/15 1100  Gross per 24 hour  Intake    240 ml  Output      0 ml  Net  240 ml    Last BM:5/4  Labs:   Recent Labs Lab 01/11/15 0103  NA 135  K 3.9  CL 104  CO2 25  BUN 26*  CREATININE 0.79  CALCIUM 8.7*  GLUCOSE 128*    CBG (last 3)  No results for input(s): GLUCAP in the last 72 hours.  Scheduled Meds: . finasteride  5 mg Oral QHS  . furosemide  40 mg Oral Daily  . levothyroxine  175 mcg Oral QPM  . pantoprazole  40 mg Oral Daily  . [START ON 01/12/2015] pneumococcal 23 valent vaccine  0.5 mL Intramuscular Tomorrow-1000  . ropinirole  5 mg Oral QHS  . sodium chloride  3 mL Intravenous Q12H  .  tamsulosin  0.4 mg Oral BID    Continuous Infusions:   Past Medical History  Diagnosis Date  . Arteriosclerotic cardiovascular disease (ASCVD)     Nonobstructive; 09/2008 50% proximal and 40% mid LAD; 25% circumflex; 30% RCA; mild global LV dysfunction with EF of 45%. No aortic stenosis.  . Mild aortic stenosis     not documented at catheterization; verified by echo in 2011  . Peripheral vascular disease     With a 70% innominate artery stenosis and nonobstructive carotid stenosis  . Hypothyroidism   . Degenerative joint disease     s/p bilateral TKR  . Mobitz (type) II atrioventricular block     With bradycardia; Medtronic pacemaker implanted in 09/2008  . Tobacco abuse, in remission     Remote  . GERD (gastroesophageal reflux disease)   . Hyperlipidemia     Lipid profile in 04/2010:115, 98, 43, 52.  . Weight loss     50 pounds between 1991 and 2011  . 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 Adrenal hyperplasia     Stable on serial imaging  . Anemia     minimal in 2011 with hemoglobin of 12.2 and high normal MCV  . Borderline hypertension     Normal CMet in 2011  . Cancer of larynx     laryngectomy in 1988; postoperative radiation therapy  . Skin cancer     Past Surgical History  Procedure Laterality Date  . Laryngectomy  1988    S/P laryngectomy and radiation therapy  . Appendectomy  1973  . Knee arthroscopy      Left  . Total knee arthroplasty      Bilateral, 19 years ago  . Cataract extraction, bilateral    . Decompression facial nerve      Right median  . Pacemaker insertion    . Insert / replace / remove pacemaker      Andre Holder RD, LDN Nutrition Pager: [phone] 01/11/2015 3:54 PM

## 2015-01-12 LAB — BASIC METABOLIC PANEL
Anion gap: 7 (ref 5–15)
BUN: 20 mg/dL (ref 6–20)
CALCIUM: 8.4 mg/dL — AB (ref 8.9–10.3)
CO2: 28 mmol/L (ref 22–32)
Chloride: 97 mmol/L — ABNORMAL LOW (ref 101–111)
Creatinine, Ser: 0.84 mg/dL (ref 0.61–1.24)
GLUCOSE: 106 mg/dL — AB (ref 70–99)
Potassium: 4.1 mmol/L (ref 3.5–5.1)
SODIUM: 132 mmol/L — AB (ref 135–145)

## 2015-01-12 NOTE — Progress Notes (Signed)
Discharge instructions reviewed with patient's wife. No distress noted. Patient requesting NS be sprayed in trach stoma. Stoma suctioned by respiratory.

## 2015-01-12 NOTE — Care Management Note (Signed)
Case Management Note  Patient Details  Name: Andre Holder MRN: [id number] Date of Birth: 07-28-1923  Subjective/Objective:                  Pt admitted from home with CHF. Pt lives with his wife and will return home at discharge. Pt is independent with ADL's. Pt is active with [facility] with Dr. Niger Ried.  Action/Plan: Pt discharged home today. Pt does not qualify for home O2 at this time (see RN oxygen saturations note). No other CM needs noted.  Expected Discharge Date:  01/12/15               Expected Discharge Plan:  Home/Self Care  In-House Referral:  NA  Discharge planning Services  CM Consult  Post Acute Care Choice:  NA Choice offered to:  NA  DME Arranged:    DME Agency:     HH Arranged:    HH Agency:     Status of Service:     Medicare Important Message Given:  N/A - LOS <3 / Initial given by admissions Date Medicare IM Given:    Medicare IM give by:    Date Additional Medicare IM Given:    Additional Medicare Important Message give by:     If discussed at [place] of Stay Meetings, dates discussed:    Additional Comments:  Joylene Draft, RN 01/12/2015, 10:48 AM

## 2015-01-12 NOTE — Progress Notes (Signed)
SATURATION QUALIFICATIONS: (This note is used to comply with regulatory documentation for home oxygen)  Patient Saturations on Room Air at Rest = 91%  Patient Saturations on Room Air while Ambulating = 94%  Patient Saturations on 5 Liters of oxygen while Ambulating = 96%  Please briefly explain why patient needs home oxygen:

## 2015-01-12 NOTE — Progress Notes (Signed)
Room air oxygen saturation 88% at rest.

## 2015-01-12 NOTE — Discharge Summary (Signed)
Physician Discharge Summary  Andre Holder ZGY:[id number] DOB: 22-Mar-1923 DOA: 01/11/2015   Admit date: 01/11/2015 Discharge date: 01/12/2015  Discharge Diagnoses: #1. Acute on chronic systolic heart failure. #2. Constipation. #3. Malnutrition. #4. Hyponatremia. #5. Laryngeal carcinoma. Active Problems:   CHF exacerbation   Malnutrition of moderate degree    Wt Readings from Last 3 Encounters:  01/11/15 159 lb 9.6 oz (72.394 kg)  12/23/14 182 lb 8.7 oz (82.8 kg)  09/05/14 165 lb 3.2 oz (74.934 kg)     Hospital Course:  This patient is an 81 year old male who presented with abdominal pain. He had been admitted last month with a partial small bowel obstruction. He had responded to conservative therapy. He underwent a CT scan which revealed possible ileus with slow progression of oral contrast. Following his CT he had a large bowel movement and resolution of his symptoms. He developed shortness of breath however and was found to have a CHF exacerbation. He was treated with IV Lasix. His BNP was elevated. Renal function was normal. He diuresed well and is now breathing comfortably. He has a tracheostomy following laryngectomy for laryngeal carcinoma.  On the day of discharge his lungs are clear. Creatinine 0.84 with an estimated GFR of over 60. Potassium is 4.1. Sodium is 132. Condition at discharge is much improved. He will have follow-up in the office in one week. His heart failure will be treated with Lasix 40 mg daily.   Discharge Instructions     Medication List    STOP taking these medications        HYDROcodone-acetaminophen 5-325 MG per tablet  Commonly known as:  NORCO/VICODIN      TAKE these medications        aspirin EC 81 MG tablet  Take 81 mg by mouth daily.     B-complex with vitamin C tablet  Take 1 tablet by mouth daily.     docusate sodium 100 MG capsule  Commonly known as:  COLACE  Take 100 mg by mouth 2 (two) times daily.     finasteride 5 MG tablet   Commonly known as:  PROSCAR  Take 5 mg by mouth at bedtime.     furosemide 40 MG tablet  Commonly known as:  LASIX  Take 1 tablet (40 mg total) by mouth daily.     levothyroxine 175 MCG tablet  Commonly known as:  SYNTHROID, LEVOTHROID  Take 175 mcg by mouth every evening.     magnesium oxide 400 MG tablet  Commonly known as:  MAG-OX  Take 400 mg by mouth 2 (two) times daily.     Melatonin 3 MG Tabs  Take 2 tablets by mouth daily.     omeprazole 20 MG capsule  Commonly known as:  PRILOSEC  Take 20 mg by mouth daily.     ropinirole 5 MG tablet  Commonly known as:  REQUIP  Take 5 mg by mouth at bedtime.     tamsulosin 0.4 MG Caps capsule  Commonly known as:  FLOMAX  Take 0.4 mg by mouth 2 (two) times daily.         [name] 01/12/2015

## 2015-01-12 NOTE — Progress Notes (Signed)
Pharmacist Heart Failure Core Measure Documentation  Assessment: Andre Holder has an EF documented as 20-25% on 08/31/14 by 2D ECHO.  Rationale: Heart failure patients with left ventricular systolic dysfunction (LVSD) and an EF < 40% should be prescribed an angiotensin converting enzyme inhibitor (ACEI) or angiotensin receptor blocker (ARB) at discharge unless a contraindication is documented in the medical record.  This patient is not currently on an ACEI or ARB for HF.  This note is being placed in the record in order to provide documentation that a contraindication to the use of these agents is present for this encounter.  ACE Inhibitor or Angiotensin Receptor Blocker is contraindicated (specify all that apply)  []   ACEI allergy AND ARB allergy []   Angioedema []   Moderate or severe aortic stenosis []   Hyperkalemia [x]   Hypotension []   Renal artery stenosis []   Worsening renal function, preexisting renal disease or dysfunction   [name] 01/12/2015 1:31 PM

## 2015-01-12 NOTE — Progress Notes (Signed)
Taken to lobby via wheelchair.

## 2015-02-20 ENCOUNTER — Other Ambulatory Visit (HOSPITAL_COMMUNITY): Payer: Self-pay | Admitting: Internal Medicine

## 2015-02-20 DIAGNOSIS — R911 Solitary pulmonary nodule: Secondary | ICD-10-CM

## 2015-02-23 ENCOUNTER — Ambulatory Visit (HOSPITAL_COMMUNITY): Payer: Medicare HMO

## 2015-03-06 ENCOUNTER — Ambulatory Visit (HOSPITAL_COMMUNITY): Payer: Medicare HMO | Attending: Internal Medicine | Admitting: Physical Therapy

## 2015-03-06 DIAGNOSIS — R2681 Unsteadiness on feet: Secondary | ICD-10-CM | POA: Insufficient documentation

## 2015-03-06 DIAGNOSIS — K624 Stenosis of anus and rectum: Secondary | ICD-10-CM | POA: Insufficient documentation

## 2015-03-06 DIAGNOSIS — K6289 Other specified diseases of anus and rectum: Secondary | ICD-10-CM

## 2015-03-06 DIAGNOSIS — M549 Dorsalgia, unspecified: Secondary | ICD-10-CM | POA: Insufficient documentation

## 2015-03-06 DIAGNOSIS — R29898 Other symptoms and signs involving the musculoskeletal system: Secondary | ICD-10-CM | POA: Insufficient documentation

## 2015-03-06 DIAGNOSIS — G8929 Other chronic pain: Secondary | ICD-10-CM | POA: Insufficient documentation

## 2015-03-06 NOTE — Therapy (Signed)
[place], [place], [address] Phone: [phone]   Fax:  [phone]  Physical Therapy Evaluation  Patient Details  Name: Andre Holder MRN: [id number] Date of Birth: 1923/02/04 Referring Provider:  Reid, [place], MD  Encounter Date: 03/06/2015      PT End of Session - 03/06/15 1501    Visit Number 1   Number of Visits 8   Date for PT Re-Evaluation 04/05/15   Authorization Type [facility] 8 visits   PT Start Time 1400   PT Stop Time 1500   PT Time Calculation (min) 60 min   Activity Tolerance Patient tolerated treatment well      Past Medical History  Diagnosis Date  . Arteriosclerotic cardiovascular disease (ASCVD)     Nonobstructive; 09/2008 50% proximal and 40% mid LAD; 25% circumflex; 30% RCA; mild global LV dysfunction with EF of 45%. No aortic stenosis.  . Mild aortic stenosis     not documented at catheterization; verified by echo in 2011  . Peripheral vascular disease     With a 70% innominate artery stenosis and nonobstructive carotid stenosis  . Hypothyroidism   . Degenerative joint disease     s/p bilateral TKR  . Mobitz (type) II atrioventricular block     With bradycardia; Medtronic pacemaker implanted in 09/2008  . Tobacco abuse, in remission     Remote  . GERD (gastroesophageal reflux disease)   . Hyperlipidemia     Lipid profile in 04/2010:115, 98, 43, 52.  . Weight loss     50 pounds between 1991 and 2011  . 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 Adrenal hyperplasia     Stable on serial imaging  . Anemia     minimal in 2011 with hemoglobin of 12.2 and high normal MCV  . Borderline hypertension     Normal CMet in 2011  . Cancer of larynx     laryngectomy in 1988; postoperative radiation therapy  . Skin cancer     Past Surgical History  Procedure Laterality Date  . Laryngectomy  1988    S/P laryngectomy and radiation therapy  . Appendectomy   1973  . Knee arthroscopy      Left  . Total knee arthroplasty      Bilateral, 19 years ago  . Cataract extraction, bilateral    . Decompression facial nerve      Right median  . Pacemaker insertion    . Insert / replace / remove pacemaker      There were no vitals filed for this visit.  Visit Diagnosis:  Chronic back pain  Chronic idiopathic anal pain  Leg weakness, bilateral  Unsteady gait      Subjective Assessment - 03/06/15 1350    Subjective Andre Holder states that he has had chronic back, hip and knee pain. At the moment his is more concerned about his back pain.  He is having difficulty getting up from a chair and walking.  He works in the garden by sitting down in a chair.   He has been referred by the [facility] and approved for 8 visits to decrease his pain.    Pertinent History HTN, TKR over 30 years ago,    How long can you sit comfortably? 15 minutes    How long can you stand comfortably? 5 minutes    How long can you walk comfortably? Pt walks with a  cane but is unstable with this device.  The pt sates at home he uses a walker but finds that it is difficult for him to bring out.     Currently in Pain? Yes   Pain Score 7    Pain Location Back   Pain Orientation Lower;Right   Pain Descriptors / Indicators Aching   Pain Radiating Towards hip   Pain Onset More than a month ago   Pain Frequency Constant   Pain Relieving Factors rest   Multiple Pain Sites Yes   Pain Score 7   Pain Location Hip   Pain Orientation Right;Left   Pain Descriptors / Indicators Aching   Pain Onset More than a month ago   Pain Frequency Constant   Aggravating Factors  activity    Pain Relieving Factors rest   Pain Score 1   Pain Location Knee   Pain Orientation Right;Left   Pain Descriptors / Indicators Aching   Pain Onset More than a month ago   Pain Frequency Rarely            [facility] PT Assessment - 03/06/15 0001    Assessment   Medical Diagnosis chronic pain   Onset  Date/Surgical Date 04/28/15   Prior Therapy none   Precautions   Precautions Fall   Restrictions   Weight Bearing Restrictions No   Balance Screen   Has the patient fallen in the past 6 months No   Has the patient had a decrease in activity level because of a fear of falling?  No   Is the patient reluctant to leave their home because of a fear of falling?  No   Home Environment   Living Environment Private residence   Type of [place] entrance   Prior Function   Level of Independence Independent with household mobility with device   Vocation Retired   Leisure garden   Cognition   Overall Cognitive Status Within Functional Limits for tasks assessed   Functional Tests   Functional tests Sit to Stand   Single Leg Stance   Comments unable    Sit to Stand   Comments 4 in 30" using cane    Posture/Postural Control   Posture/Postural Control Postural limitations   Postural Limitations Rounded Shoulders;Forward head;Decreased lumbar lordosis;Increased thoracic kyphosis   AROM   Lumbar Extension decreased 90%   Lumbar - Right Side Bend decreased 30%   Lumbar - Left Side Bend decreased 30%   Lumbar - Right Rotation decreased 80%   Lumbar - Left Rotation decreased 70%   Strength   Right/Left Hip Right;Left   Right Hip Flexion 5/5   Right Hip Extension 2+/5   Right Hip ABduction 3-/5   Left Hip Flexion 4/5   Left Hip Extension 2+/5   Left Hip ABduction 3-/5   Right/Left Knee Right;Left   Right Knee Flexion 3+/5   Right Knee Extension 5/5   Left Knee Flexion 3-/5   Left Knee Extension 5/5   Right/Left Ankle Right;Left   Right Ankle Dorsiflexion 5/5   Left Ankle Dorsiflexion 3+/5                   [facility] Adult PT Treatment/Exercise - 03/06/15 0001    Exercises   Exercises Lumbar   Lumbar Exercises: Stretches   Active Hamstring Stretch 2 reps;30 seconds   Single Knee to Chest Stretch 2 reps;30 seconds   Standing Side Bend 3 reps   Standing  Extension  3 reps   Prone on Elbows Stretch 1 rep;30 seconds   Lumbar Exercises: Supine   Ab Set 10 reps   Glut Set 10 reps   Bridge 5 reps   Lumbar Exercises: Sidelying   Hip Abduction 10 reps   Lumbar Exercises: Prone   Other Prone Lumbar Exercises knee flexion x 10 B                 PT Education - 03/06/15 1501    Education provided Yes   Education Details HEP   Person(s) Educated Patient   Methods Explanation;Handout   Comprehension Verbalized understanding;Returned demonstration          PT Short Term Goals - 03/06/15 1509    PT SHORT TERM GOAL #1   Title Pt I in HEP   Time 1   Period Weeks   PT SHORT TERM GOAL #2   Title Pt to be able to complete 5 sit to stand in 30 seconds to demonstrate improved power   Time 2   Period Weeks   PT SHORT TERM GOAL #3   Title Pt back pain to have decreased to 5/10   Time 2   Period Weeks   PT SHORT TERM GOAL #4   Title Pt to verbalize the importance of posture in back care   Time 2   Period Weeks           PT Long Term Goals - 03/06/15 1511    PT LONG TERM GOAL #1   Title PT to be I in advance HEP   Time 4   Period Weeks   PT LONG TERM GOAL #2   Title Pt increase LE strength by 1 grade to decrease back pain   Time 4   Period Weeks   PT LONG TERM GOAL #3   Title Pt to demonstrate good posture while walking   Time 4   Period Weeks   PT LONG TERM GOAL #4   Title Pt ROM to be increased by 20% to ease back pain   Time 4   Period Weeks   PT LONG TERM GOAL #5   Title Pt back pain to be no greater than a 3/10 70% of the day   Time 4   Period Weeks               Plan - 03/06/15 1502    Clinical Impression Statement Andre Holder is an 81 yo male who has had chronic back pain and increasing difficulty with walking .  He is being referred by the [facility]  for skilled physkical therapy.  Examination demonstrates decreased ROM, strength and increased pain.  Andre Holder will benefit from skilled PT to address these  issues and improve the safety of his gait.    Pt will benefit from skilled therapeutic intervention in order to improve on the following deficits Abnormal gait;Decreased balance;Decreased mobility;Decreased strength;Decreased range of motion;Pain   Rehab Potential Fair   PT Frequency 2x / week   PT Duration 4 weeks   PT Treatment/Interventions Therapeutic exercise;Therapeutic activities;Functional mobility training;Gait training   PT Next Visit Plan begin standing activity to include rockerboard, 3 D hip excursion, heel raises; functional squats, sitde stepping, SLS with one arm hold, lateral and forward step ups.    PT Home Exercise Plan given    Consulted and Agree with Plan of Care Patient;Family member/caregiver   Family Member Consulted wife          Problem  List Patient Active Problem List   Diagnosis Date Noted  . Malnutrition of moderate degree 01/11/2015  . Small bowel obstruction 12/23/2014  . Chronic systolic congestive heart failure, NYHA class 2 12/23/2014  . SBO (small bowel obstruction) 12/23/2014  . CHF exacerbation 08/31/2014  . Acute on chronic systolic CHF (congestive heart failure) 08/31/2014  . Chest pain 08/31/2014  . Dyspnea 08/31/2014  . CHF (congestive heart failure) 08/25/2013  . Syncope 10/09/2011  . Hyponatremia 10/09/2011  . GERD 06/17/2010  . WEIGHT LOSS, ABNORMAL 06/17/2010  . ANEMIA 04/08/2010  . Mobitz type II atrioventricular block 04/08/2010  . Hyperlipidemia 10/24/2009  . PACEMAKER, PERMANENT 09/20/2009  . CANCER, LARYNX 03/07/2009  . Aortic valve disorder 03/07/2009  . CEREBROVASCULAR DISEASE 03/07/2009  . PERIPHERAL VASCULAR DISEASE 03/07/2009  . Tobacco abuse, in remission 03/07/2009  . Hypothyroidism 12/27/2008  . OSTEOARTHRITIS 12/27/2008    Rayetta Humphrey, PT CLT [phone] 03/06/2015, 3:17 PM  [place] [address] [place], [place], [address] Phone: [phone]   Fax:   [phone]

## 2015-03-06 NOTE — Patient Instructions (Signed)
Isometric Abdominal   Lying on back with knees bent, tighten stomach by pressing elbows down. Hold ___3-5_ seconds. Repeat ___10_ times per set. Do _1___ sets per session. Do ___2_ sessions per day.  http://orth.exer.us/1086   Copyright  VHI. All rights reserved.  Isometric Gluteals   Tighten buttock muscles.  You can do this while sitting in a chair.  Repeat _10___ times per set. Do __1__ sets per session. Do __3__ sessions per day.  http://orth.exer.us/1126   Copyright  VHI. All rights reserved.  Unilateral Isometric Hip Flexion   Tighten stomach and raise right knee to outstretched arm. Push gently, keeping arm straight, trunk rigid. Hold __3-5__ seconds. Repeat _10___ times per set. Do _1___ sets per session. Do ___2_ sessions per day.  http://orth.exer.us/1098   Copyright  VHI. All rights reserved.  Bridging   Slowly raise buttocks from floor, keeping stomach tight. Repeat __10__ times per set. Do _1___ sets per session. Do _2___ sessions per day.  http://orth.exer.us/1096   Copyright  VHI. All rights reserved.  Strengthening: Hip Abduction (Side-Lying)   Tighten muscles on front of left thigh, then lift leg _18___ inches from surface, keeping knee locked.  Repeat ___10_ times per set. Do __1__ sets per session. Do ____2 sessions per day.  http://orth.exer.us/622   Copyright  VHI. All rights reserved.  Self-Mobilization: Knee Flexion (Prone)   Bring left heel toward buttocks as close as possible. Hold _2___ seconds. Relax. Repeat _10___ times per set. Do _1___ sets per session. Do _2___ sessions per day.  http://orth.exer.us/596   Copyright  VHI. All rights reserved.  Heel Squeeze (Prone)   Abdomen supported, bend knees and gently squeeze heels together. Hold _3___ seconds. Repeat __10__ times per set. Do __1__ sets per session. Do _2___ sessions per day.  http://orth.exer.us/1080   Copyright  VHI. All rights reserved.  On Elbows  (Prone)   Rise up on elbows as high as possible, keeping hips on floor. Hold 60____ seconds. Repeat ___1-2_ times per set. Do ___1_ sets per session. Do ____2 sessions per day.  http://orth.exer.us/92   Copyright  VHI. All rights reserved.  Knee-to-Chest Stretch: Unilateral   With hand behind right knee, pull knee in to chest until a comfortable stretch is felt in lower back and buttocks. Keep back relaxed. Hold __30__ seconds. Repeat __1 http://orth.exer.us/126   Copyright  VHI. All rights reserved.  Stretching: Hamstring (Supine)  Supporting right thigh behind knee, slowly straighten knee until stretch is felt in back of thigh. Hold _30___ seconds. Repeat ____ times per set. Do ___1_ sets per session. Do ___2_ sessions per day. 3 http://orth.exer.us/656   Copyright  VHI. All rights reserved.

## 2015-03-09 ENCOUNTER — Ambulatory Visit (HOSPITAL_COMMUNITY): Payer: No Typology Code available for payment source | Attending: Internal Medicine

## 2015-03-09 DIAGNOSIS — R2681 Unsteadiness on feet: Secondary | ICD-10-CM | POA: Diagnosis present

## 2015-03-09 DIAGNOSIS — M549 Dorsalgia, unspecified: Secondary | ICD-10-CM | POA: Diagnosis not present

## 2015-03-09 DIAGNOSIS — G8929 Other chronic pain: Secondary | ICD-10-CM | POA: Insufficient documentation

## 2015-03-09 DIAGNOSIS — K6289 Other specified diseases of anus and rectum: Secondary | ICD-10-CM

## 2015-03-09 DIAGNOSIS — K624 Stenosis of anus and rectum: Secondary | ICD-10-CM | POA: Insufficient documentation

## 2015-03-09 DIAGNOSIS — R29898 Other symptoms and signs involving the musculoskeletal system: Secondary | ICD-10-CM | POA: Diagnosis present

## 2015-03-09 NOTE — Therapy (Signed)
[place], [place], [address] Phone: [phone]   Fax:  [phone]  Physical Therapy Treatment  Patient Details  Name: Andre Holder MRN: [id number] Date of Birth: 1922/09/29 Referring Provider:  Reid, [place], MD  Encounter Date: 03/09/2015      PT End of Session - 03/09/15 1529    Visit Number 2   Number of Visits 8   Date for PT Re-Evaluation 04/05/15   Authorization Type [facility] 8 visits   PT Start Time 1435   PT Stop Time 1522   PT Time Calculation (min) 47 min   Activity Tolerance Patient tolerated treatment well   Behavior During Therapy [facility] for tasks assessed/performed      Past Medical History  Diagnosis Date  . Arteriosclerotic cardiovascular disease (ASCVD)     Nonobstructive; 09/2008 50% proximal and 40% mid LAD; 25% circumflex; 30% RCA; mild global LV dysfunction with EF of 45%. No aortic stenosis.  . Mild aortic stenosis     not documented at catheterization; verified by echo in 2011  . Peripheral vascular disease     With a 70% innominate artery stenosis and nonobstructive carotid stenosis  . Hypothyroidism   . Degenerative joint disease     s/p bilateral TKR  . Mobitz (type) II atrioventricular block     With bradycardia; Medtronic pacemaker implanted in 09/2008  . Tobacco abuse, in remission     Remote  . GERD (gastroesophageal reflux disease)   . Hyperlipidemia     Lipid profile in 04/2010:115, 98, 43, 52.  . Weight loss     50 pounds between 1991 and 2011  . 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 Adrenal hyperplasia     Stable on serial imaging  . Anemia     minimal in 2011 with hemoglobin of 12.2 and high normal MCV  . Borderline hypertension     Normal CMet in 2011  . Cancer of larynx     laryngectomy in 1988; postoperative radiation therapy  . Skin cancer     Past Surgical History  Procedure Laterality Date  . Laryngectomy  1988    S/P laryngectomy and radiation therapy  . Appendectomy  1973  . Knee arthroscopy      Left  . Total knee arthroplasty      Bilateral, 19 years ago  . Cataract extraction, bilateral    . Decompression facial nerve      Right median  . Pacemaker insertion    . Insert / replace / remove pacemaker      There were no vitals filed for this visit.  Visit Diagnosis:  Chronic back pain  Chronic idiopathic anal pain  Leg weakness, bilateral  Unsteady gait      Subjective Assessment - 03/09/15 1453    Subjective Pt stated he has been compliant wtih HEP, current back pain 7/10   Currently in Pain? Yes   Pain Score 7    Pain Location Back   Pain Orientation Lower   Pain Descriptors / Indicators Aching             [facility] Adult PT Treatment/Exercise - 03/09/15 0001    Exercises   Exercises Knee/Hip   Knee/Hip Exercises: Standing   Heel Raises Both;10 reps   Heel Raises Limitations Toe raises with therapist facilitation for proper form and technique   Lateral Step Up Both;10 reps;Hand Hold: 2;Step Height:  4"   Forward Step Up Both;10 reps;Hand Hold: 2;Step Height: 4"   Functional Squat 10 reps   Functional Squat Limitations 3D hip excursion   Rocker Board 2 minutes   Rocker Board Limitations R/L with Bil HHA, DF/PF 10c   SLS 2x 30" with HHA   Other Standing Knee Exercises sidestepping begin next session           PT Short Term Goals - 03/09/15 1759    PT SHORT TERM GOAL #1   Title Pt I in HEP   Status On-going   PT SHORT TERM GOAL #2   Title Pt to be able to complete 5 sit to stand in 30 seconds to demonstrate improved power   PT SHORT TERM GOAL #3   Title Pt back pain to have decreased to 5/10   PT SHORT TERM GOAL #4   Title Pt to verbalize the importance of posture in back care   Status On-going           PT Long Term Goals - 03/09/15 1800    PT LONG TERM GOAL #1   Title PT to be I in advance HEP   PT LONG TERM GOAL #2   Title Pt increase LE strength  by 1 grade to decrease back pain   Status On-going   PT LONG TERM GOAL #3   Title Pt to demonstrate good posture while walking   Status On-going   PT LONG TERM GOAL #4   Title Pt ROM to be increased by 20% to ease back pain   PT LONG TERM GOAL #5   Title Pt back pain to be no greater than a 3/10 70% of the day               Plan - 03/09/15 1757    Clinical Impression Statement Reviewed goals, HEP compliance and copy of evaluation given.  Began standing exercises with focus on improving hip mobilty, LE strengthening and eduction on importance of proper posture.  Pt with multiple cueing required to improve posture through out session. Therapist facilitaiton for proper form and technique with exercises.  Pt reported pain reduced at end of session.  Pt HOH and best to stand on Lt side with hearing aide.   PT Next Visit Plan Continue standing activity to include rockerboard, 3 D hip excursion, heel raises; functional squats, sitde stepping, SLS with one arm hold, lateral and forward step ups.         Problem List Patient Active Problem List   Diagnosis Date Noted  . Malnutrition of moderate degree 01/11/2015  . Small bowel obstruction 12/23/2014  . Chronic systolic congestive heart failure, NYHA class 2 12/23/2014  . SBO (small bowel obstruction) 12/23/2014  . CHF exacerbation 08/31/2014  . Acute on chronic systolic CHF (congestive heart failure) 08/31/2014  . Chest pain 08/31/2014  . Dyspnea 08/31/2014  . CHF (congestive heart failure) 08/25/2013  . Syncope 10/09/2011  . Hyponatremia 10/09/2011  . GERD 06/17/2010  . WEIGHT LOSS, ABNORMAL 06/17/2010  . ANEMIA 04/08/2010  . Mobitz type II atrioventricular block 04/08/2010  . Hyperlipidemia 10/24/2009  . PACEMAKER, PERMANENT 09/20/2009  . CANCER, LARYNX 03/07/2009  . Aortic valve disorder 03/07/2009  . CEREBROVASCULAR DISEASE 03/07/2009  . PERIPHERAL VASCULAR DISEASE 03/07/2009  . Tobacco abuse, in remission 03/07/2009  .  Hypothyroidism 12/27/2008  . OSTEOARTHRITIS 12/27/2008   Aldona Lento, PTA  Aldona Lento 03/09/2015, 6:03 PM  [facility] [address] [place], [place], [address] Phone: [phone]   Fax:  [phone]

## 2015-03-13 ENCOUNTER — Ambulatory Visit (HOSPITAL_COMMUNITY): Payer: No Typology Code available for payment source | Admitting: Physical Therapy

## 2015-03-13 DIAGNOSIS — R29898 Other symptoms and signs involving the musculoskeletal system: Secondary | ICD-10-CM

## 2015-03-13 DIAGNOSIS — M549 Dorsalgia, unspecified: Secondary | ICD-10-CM | POA: Diagnosis not present

## 2015-03-13 DIAGNOSIS — R2681 Unsteadiness on feet: Secondary | ICD-10-CM

## 2015-03-13 DIAGNOSIS — G8929 Other chronic pain: Secondary | ICD-10-CM

## 2015-03-13 NOTE — Therapy (Signed)
[place], [place], [address] Phone: [phone]   Fax:  [phone]  Physical Therapy Treatment  Patient Details  Name: Andre Holder MRN: [id number] Date of Birth: 06/04/1923 Referring Provider:  Reid, [place], MD  Encounter Date: 03/13/2015      PT End of Session - 03/13/15 1512    Visit Number 3   Number of Visits 8   Date for PT Re-Evaluation 04/05/15   Authorization Type [facility] 8 visits   PT Start Time 3220   PT Stop Time 1509   PT Time Calculation (min) 44 min   Activity Tolerance Patient tolerated treatment well   Behavior During Therapy [facility] for tasks assessed/performed      Past Medical History  Diagnosis Date  . Arteriosclerotic cardiovascular disease (ASCVD)     Nonobstructive; 09/2008 50% proximal and 40% mid LAD; 25% circumflex; 30% RCA; mild global LV dysfunction with EF of 45%. No aortic stenosis.  . Mild aortic stenosis     not documented at catheterization; verified by echo in 2011  . Peripheral vascular disease     With a 70% innominate artery stenosis and nonobstructive carotid stenosis  . Hypothyroidism   . Degenerative joint disease     s/p bilateral TKR  . Mobitz (type) II atrioventricular block     With bradycardia; Medtronic pacemaker implanted in 09/2008  . Tobacco abuse, in remission     Remote  . GERD (gastroesophageal reflux disease)   . Hyperlipidemia     Lipid profile in 04/2010:115, 98, 43, 52.  . Weight loss     50 pounds between 1991 and 2011  . 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 Adrenal hyperplasia     Stable on serial imaging  . Anemia     minimal in 2011 with hemoglobin of 12.2 and high normal MCV  . Borderline hypertension     Normal CMet in 2011  . Cancer of larynx     laryngectomy in 1988; postoperative radiation therapy  . Skin cancer     Past Surgical History  Procedure Laterality Date  . Laryngectomy  1988    S/P laryngectomy and radiation therapy  . Appendectomy  1973  . Knee arthroscopy      Left  . Total knee arthroplasty      Bilateral, 19 years ago  . Cataract extraction, bilateral    . Decompression facial nerve      Right median  . Pacemaker insertion    . Insert / replace / remove pacemaker      There were no vitals filed for this visit.  Visit Diagnosis:  Chronic back pain  Leg weakness, bilateral  Unsteady gait      Subjective Assessment - 03/13/15 1432    Subjective Pt reports that he is having a lot of LBP today, he rates the pain 8/10.   Currently in Pain? Yes   Pain Score 8    Pain Location Back   Pain Orientation Right;Posterior            [facility] Adult PT Treatment/Exercise - 03/13/15 0001    Lumbar Exercises: Stretches   Single Knee to Chest Stretch 10 seconds   Single Knee to Chest Stretch Limitations 10 reps   Lower Trunk Rotation Limitations 2 second hold x 20 reps   Pelvic Tilt Limitations 2 second hold x 10 reps   Lumbar Exercises: Supine  Heel Slides 5 reps   Heel Slides Limitations with abdominal bracing   Bridge 10 reps   Knee/Hip Exercises: Standing   Heel Raises Both;15 reps   Lateral Step Up Both;10 reps;Hand Hold: 2;Step Height: 4"   Forward Step Up Both;10 reps;Hand Hold: 2;Step Height: 4"   Rocker Board 2 minutes   Rocker Board Limitations R/L and A/P with Bil HHA   SLS 2x 30" with HHA   Other Standing Knee Exercises sidestepping in // bars x 5 RT   Knee/Hip Exercises: Seated   Sit to Sand 2 sets;5 reps                PT Education - 03/13/15 1512    Education provided Yes   Education Details Education on flexion exercises to decrease back pain   Person(s) Educated Patient   Methods Explanation   Comprehension Verbalized understanding          PT Short Term Goals - 03/09/15 1759    PT SHORT TERM GOAL #1   Title Pt I in HEP   Status On-going   PT SHORT TERM GOAL #2   Title Pt to be able to complete 5 sit to  stand in 30 seconds to demonstrate improved power   PT SHORT TERM GOAL #3   Title Pt back pain to have decreased to 5/10   PT SHORT TERM GOAL #4   Title Pt to verbalize the importance of posture in back care   Status On-going           PT Long Term Goals - 03/09/15 1800    PT LONG TERM GOAL #1   Title PT to be I in advance HEP   PT LONG TERM GOAL #2   Title Pt increase LE strength by 1 grade to decrease back pain   Status On-going   PT LONG TERM GOAL #3   Title Pt to demonstrate good posture while walking   Status On-going   PT LONG TERM GOAL #4   Title Pt ROM to be increased by 20% to ease back pain   PT LONG TERM GOAL #5   Title Pt back pain to be no greater than a 3/10 70% of the day               Plan - 03/13/15 1513    Clinical Impression Statement Flexion based exercises were introduced to decrease tightness and pain in low back. Pt responded well to lumbar stretching/ROM activities and core stabilziation, reporting decreased pain post treatment. Post-session, pt reported that his legs felt looser, and that he was able to walk more easily.    PT Next Visit Plan Continue with flexion exercises, core stabilization, BLE strengthening, begin gait training.         Problem List Patient Active Problem List   Diagnosis Date Noted  . Malnutrition of moderate degree 01/11/2015  . Small bowel obstruction 12/23/2014  . Chronic systolic congestive heart failure, NYHA class 2 12/23/2014  . SBO (small bowel obstruction) 12/23/2014  . CHF exacerbation 08/31/2014  . Acute on chronic systolic CHF (congestive heart failure) 08/31/2014  . Chest pain 08/31/2014  . Dyspnea 08/31/2014  . CHF (congestive heart failure) 08/25/2013  . Syncope 10/09/2011  . Hyponatremia 10/09/2011  . GERD 06/17/2010  . WEIGHT LOSS, ABNORMAL 06/17/2010  . ANEMIA 04/08/2010  . Mobitz type II atrioventricular block 04/08/2010  . Hyperlipidemia 10/24/2009  . PACEMAKER, PERMANENT 09/20/2009  .  CANCER, LARYNX 03/07/2009  . Aortic  valve disorder 03/07/2009  . CEREBROVASCULAR DISEASE 03/07/2009  . PERIPHERAL VASCULAR DISEASE 03/07/2009  . Tobacco abuse, in remission 03/07/2009  . Hypothyroidism 12/27/2008  . OSTEOARTHRITIS 12/27/2008    Hilma Favors, PT, DPT [phone] 03/13/2015, 3:15 PM  [place] [address] [place], [place], [address] Phone: [phone]   Fax:  [phone]

## 2015-03-16 ENCOUNTER — Ambulatory Visit (HOSPITAL_COMMUNITY): Payer: No Typology Code available for payment source | Admitting: Physical Therapy

## 2015-03-16 DIAGNOSIS — R2681 Unsteadiness on feet: Secondary | ICD-10-CM

## 2015-03-16 DIAGNOSIS — R29898 Other symptoms and signs involving the musculoskeletal system: Secondary | ICD-10-CM

## 2015-03-16 DIAGNOSIS — M549 Dorsalgia, unspecified: Principal | ICD-10-CM

## 2015-03-16 DIAGNOSIS — G8929 Other chronic pain: Secondary | ICD-10-CM

## 2015-03-16 NOTE — Therapy (Signed)
[place], [place], [address] Phone: [phone]   Fax:  [phone]  Physical Therapy Treatment  Patient Details  Name: Andre Holder MRN: [id number] Date of Birth: 1923/01/23 Referring Provider:  Asencion Noble, MD  Encounter Date: 03/16/2015      PT End of Session - 03/16/15 1522    Visit Number 4   Number of Visits 8   Date for PT Re-Evaluation 04/05/15   Authorization Type [place] 8 visits   PT Start Time 0388   PT Stop Time 1513   PT Time Calculation (min) 42 min   Activity Tolerance Patient tolerated treatment well   Behavior During Therapy [facility] for tasks assessed/performed      Past Medical History  Diagnosis Date  . Arteriosclerotic cardiovascular disease (ASCVD)     Nonobstructive; 09/2008 50% proximal and 40% mid LAD; 25% circumflex; 30% RCA; mild global LV dysfunction with EF of 45%. No aortic stenosis.  . Mild aortic stenosis     not documented at catheterization; verified by echo in 2011  . Peripheral vascular disease     With a 70% innominate artery stenosis and nonobstructive carotid stenosis  . Hypothyroidism   . Degenerative joint disease     s/p bilateral TKR  . Mobitz (type) II atrioventricular block     With bradycardia; Medtronic pacemaker implanted in 09/2008  . Tobacco abuse, in remission     Remote  . GERD (gastroesophageal reflux disease)   . Hyperlipidemia     Lipid profile in 04/2010:115, 98, 43, 52.  . Weight loss     50 pounds between 1991 and 2011  . 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 Adrenal hyperplasia     Stable on serial imaging  . Anemia     minimal in 2011 with hemoglobin of 12.2 and high normal MCV  . Borderline hypertension     Normal CMet in 2011  . Cancer of larynx     laryngectomy in 1988; postoperative radiation therapy  . Skin cancer     Past Surgical History  Procedure Laterality Date  . Laryngectomy  1988     S/P laryngectomy and radiation therapy  . Appendectomy  1973  . Knee arthroscopy      Left  . Total knee arthroplasty      Bilateral, 19 years ago  . Cataract extraction, bilateral    . Decompression facial nerve      Right median  . Pacemaker insertion    . Insert / replace / remove pacemaker      There were no vitals filed for this visit.  Visit Diagnosis:  Chronic back pain  Leg weakness, bilateral  Unsteady gait      Subjective Assessment - 03/16/15 1517    Subjective Patient continues to report that he is having severe low back pain today   Pertinent History HTN, TKR over 30 years ago,    Currently in Pain? Yes   Pain Score 8    Pain Location Back                         [facility] Adult PT Treatment/Exercise - 03/16/15 0001    Transfers   Comments patient displayed difficulty in supine to sit transfer; attempted to educated patient on proper sequencing but patient continued to have difficulty with transfer   Lumbar Exercises: Stretches  Passive Hamstring Stretch 3 reps;30 seconds   Passive Hamstring Stretch Limitations supine    Single Knee to Chest Stretch 5 reps   Single Knee to Chest Stretch Limitations 5 reps, 15 seconds each    Lower Trunk Rotation 5 reps   Lower Trunk Rotation Limitations 5 seconds each    Piriformis Stretch 3 reps;30 seconds   Piriformis Stretch Limitations supine    Knee/Hip Exercises: Standing   Heel Raises Both;15 reps   SLS 1x20AP and lateral, B HHA    Other Standing Knee Exercises 3D hip excursions with Mod cues for frontal plane technique    Other Standing Knee Exercises Standing hip ABD 1x10   Knee/Hip Exercises: Seated   Other Seated Knee/Hip Exercises postual exercises including hip ABD and posterior shoulder rolls with Mod tactile cues                 PT Education - 03/16/15 1522    Education provided No          PT Short Term Goals - 03/09/15 1759    PT SHORT TERM GOAL #1   Title Pt I in HEP    Status On-going   PT SHORT TERM GOAL #2   Title Pt to be able to complete 5 sit to stand in 30 seconds to demonstrate improved power   PT SHORT TERM GOAL #3   Title Pt back pain to have decreased to 5/10   PT SHORT TERM GOAL #4   Title Pt to verbalize the importance of posture in back care   Status On-going           PT Long Term Goals - 03/09/15 1800    PT LONG TERM GOAL #1   Title PT to be I in advance HEP   PT LONG TERM GOAL #2   Title Pt increase LE strength by 1 grade to decrease back pain   Status On-going   PT LONG TERM GOAL #3   Title Pt to demonstrate good posture while walking   Status On-going   PT LONG TERM GOAL #4   Title Pt ROM to be increased by 20% to ease back pain   PT LONG TERM GOAL #5   Title Pt back pain to be no greater than a 3/10 70% of the day               Plan - 03/16/15 1523    Clinical Impression Statement Focused on flexibility and mobility in low back today; pain was reduced after proximal muscle and lumbar stretches today. Performed seated and standing exercises for general strength and postural control. Patient reported reduced pain post-session however continued to demonstrate gait instability as he idid enterring clinic with [name].    Pt will benefit from skilled therapeutic intervention in order to improve on the following deficits Abnormal gait;Decreased balance;Decreased mobility;Decreased strength;Decreased range of motion;Pain   Rehab Potential Fair   PT Frequency 2x / week   PT Duration 4 weeks   PT Treatment/Interventions Therapeutic exercise;Therapeutic activities;Functional mobility training;Gait training   PT Next Visit Plan Continue with flexion exercises, core stabilization, BLE strengthening, begin gait training. Consider recommending walker at home due to instability in gait today.    PT Home Exercise Plan given    Consulted and Agree with Plan of Care Patient;Family member/caregiver   Family Member Consulted wife          Problem List Patient Active Problem List   Diagnosis Date Noted  . Malnutrition  of moderate degree 01/11/2015  . Small bowel obstruction 12/23/2014  . Chronic systolic congestive heart failure, NYHA class 2 12/23/2014  . SBO (small bowel obstruction) 12/23/2014  . CHF exacerbation 08/31/2014  . Acute on chronic systolic CHF (congestive heart failure) 08/31/2014  . Chest pain 08/31/2014  . Dyspnea 08/31/2014  . CHF (congestive heart failure) 08/25/2013  . Syncope 10/09/2011  . Hyponatremia 10/09/2011  . GERD 06/17/2010  . WEIGHT LOSS, ABNORMAL 06/17/2010  . ANEMIA 04/08/2010  . Mobitz type II atrioventricular block 04/08/2010  . Hyperlipidemia 10/24/2009  . PACEMAKER, PERMANENT 09/20/2009  . CANCER, LARYNX 03/07/2009  . Aortic valve disorder 03/07/2009  . CEREBROVASCULAR DISEASE 03/07/2009  . PERIPHERAL VASCULAR DISEASE 03/07/2009  . Tobacco abuse, in remission 03/07/2009  . Hypothyroidism 12/27/2008  . OSTEOARTHRITIS 12/27/2008    Deniece Ree PT, DPT [place] [address] [place], [place], [address] Phone: [phone]   Fax:  [phone]

## 2015-03-20 ENCOUNTER — Encounter (HOSPITAL_COMMUNITY): Payer: Non-veteran care | Admitting: Physical Therapy

## 2015-03-22 ENCOUNTER — Ambulatory Visit (HOSPITAL_COMMUNITY): Payer: No Typology Code available for payment source | Admitting: Physical Therapy

## 2015-03-22 DIAGNOSIS — G8929 Other chronic pain: Secondary | ICD-10-CM

## 2015-03-22 DIAGNOSIS — R29898 Other symptoms and signs involving the musculoskeletal system: Secondary | ICD-10-CM

## 2015-03-22 DIAGNOSIS — K6289 Other specified diseases of anus and rectum: Secondary | ICD-10-CM

## 2015-03-22 DIAGNOSIS — M549 Dorsalgia, unspecified: Secondary | ICD-10-CM | POA: Diagnosis not present

## 2015-03-22 DIAGNOSIS — R2681 Unsteadiness on feet: Secondary | ICD-10-CM

## 2015-03-22 NOTE — Therapy (Signed)
[place], [place], [address] Phone: [phone]   Fax:  [phone]  Physical Therapy Treatment  Patient Details  Name: ARICK MARENO MRN: [id number] Date of Birth: [birth year] Referring Provider:  Reid, [place], MD  Encounter Date: 03/22/2015      PT End of Session - 03/22/15 0927    Visit Number 5   Number of Visits 8   Authorization Type [facility] 8 visits   PT Start Time 0850   PT Stop Time 0930   PT Time Calculation (min) 40 min      Past Medical History  Diagnosis Date  . Arteriosclerotic cardiovascular disease (ASCVD)     Nonobstructive; 09/2008 50% proximal and 40% mid LAD; 25% circumflex; 30% RCA; mild global LV dysfunction with EF of 45%. No aortic stenosis.  . Mild aortic stenosis     not documented at catheterization; verified by echo in 2011  . Peripheral vascular disease     With a 70% innominate artery stenosis and nonobstructive carotid stenosis  . Hypothyroidism   . Degenerative joint disease     s/p bilateral TKR  . Mobitz (type) II atrioventricular block     With bradycardia; Medtronic pacemaker implanted in 09/2008  . Tobacco abuse, in remission     Remote  . GERD (gastroesophageal reflux disease)   . Hyperlipidemia     Lipid profile in 04/2010:115, 98, 43, 52.  . Weight loss     50 pounds between 1991 and 2011  . 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 Adrenal hyperplasia     Stable on serial imaging  . Anemia     minimal in 2011 with hemoglobin of 12.2 and high normal MCV  . Borderline hypertension     Normal CMet in 2011  . Cancer of larynx     laryngectomy in 1988; postoperative radiation therapy  . Skin cancer     Past Surgical History  Procedure Laterality Date  . Laryngectomy  1988    S/P laryngectomy and radiation therapy  . Appendectomy  1973  . Knee arthroscopy      Left  . Total knee arthroplasty      Bilateral, 19 years  ago  . Cataract extraction, bilateral    . Decompression facial nerve      Right median  . Pacemaker insertion    . Insert / replace / remove pacemaker      There were no vitals filed for this visit.  Visit Diagnosis:  Chronic back pain  Leg weakness, bilateral  Unsteady gait  Chronic idiopathic anal pain      Subjective Assessment - 03/22/15 0904    Subjective Pt states he wants [place] to work on his back.  Explained to pt that the exercises we have been doing are working on his back but his problems are chronic and it will take time for his pain to decrease.  Pt states he uses a walker but his walker was given to him from [place] just recently and does not fold up therefore he uses a cane when he goes out.    Currently in Pain? Yes   Pain Score 8    Pain Location Back   Pain Orientation Right                         [facility] Adult PT Treatment/Exercise - 03/22/15 0001  Lumbar Exercises: Stretches   Active Hamstring Stretch 2 reps;30 seconds;Other (comment)   Active Hamstring Stretch Limitations supine   Standing Extension 5 reps   Prone on Elbows Stretch 1 rep;60 seconds   Lumbar Exercises: Seated   Sit to Stand Limitations --  thoracic excursion for extension and side bend only x 3   Lumbar Exercises: Supine   Bridge 10 reps   Lumbar Exercises: Sidelying   Hip Abduction 10 reps   Lumbar Exercises: Prone   Straight Leg Raise 10 reps   Other Prone Lumbar Exercises heel squeeze; rows and shoulder extension x 10   Other Prone Lumbar Exercises glut set x 10 prone    Manual Therapy   Manual Therapy Joint mobilization;Soft tissue mobilization   Joint Mobilization Grade I,II to lumbar L2-5    Soft tissue mobilization to decrease tension                    PT Short Term Goals - 03/09/15 1759    PT SHORT TERM GOAL #1   Title Pt I in HEP   Status On-going   PT SHORT TERM GOAL #2   Title Pt to be able to complete 5 sit to stand in 30 seconds to  demonstrate improved power   PT SHORT TERM GOAL #3   Title Pt back pain to have decreased to 5/10   PT SHORT TERM GOAL #4   Title Pt to verbalize the importance of posture in back care   Status On-going           PT Long Term Goals - 03/09/15 1800    PT LONG TERM GOAL #1   Title PT to be I in advance HEP   PT LONG TERM GOAL #2   Title Pt increase LE strength by 1 grade to decrease back pain   Status On-going   PT LONG TERM GOAL #3   Title Pt to demonstrate good posture while walking   Status On-going   PT LONG TERM GOAL #4   Title Pt ROM to be increased by 20% to ease back pain   PT LONG TERM GOAL #5   Title Pt back pain to be no greater than a 3/10 70% of the day               Plan - 03/22/15 0928    Clinical Impression Statement Pt is not completing exercieses at home states he works out in the [employer].  Explained that this does not do the same thing that we are trying to accomplish in therapy.  Pt has increased pain with weight bearing therefore will concentrate on stabiliztion focusing on hip abductior and extnsor mm.    PT Next Visit Plan Work mainly in [place] bearing stabilization focusing on glut medius and maximus.         Problem List Patient Active Problem List   Diagnosis Date Noted  . Malnutrition of moderate degree 01/11/2015  . Small bowel obstruction 12/23/2014  . Chronic systolic congestive heart failure, NYHA class 2 12/23/2014  . SBO (small bowel obstruction) 12/23/2014  . CHF exacerbation 08/31/2014  . Acute on chronic systolic CHF (congestive heart failure) 08/31/2014  . Chest pain 08/31/2014  . Dyspnea 08/31/2014  . CHF (congestive heart failure) 08/25/2013  . Syncope 10/09/2011  . Hyponatremia 10/09/2011  . GERD 06/17/2010  . WEIGHT LOSS, ABNORMAL 06/17/2010  . ANEMIA 04/08/2010  . Mobitz type II atrioventricular block 04/08/2010  . Hyperlipidemia 10/24/2009  .  PACEMAKER, PERMANENT 09/20/2009  . CANCER, LARYNX 03/07/2009  .  Aortic valve disorder 03/07/2009  . CEREBROVASCULAR DISEASE 03/07/2009  . PERIPHERAL VASCULAR DISEASE 03/07/2009  . Tobacco abuse, in remission 03/07/2009  . Hypothyroidism 12/27/2008  . OSTEOARTHRITIS 12/27/2008   Rayetta Humphrey, PT CLT [phone] 03/22/2015, 9:36 AM  [facility] [address] [place], [place], [address] Phone: [phone]   Fax:  [phone]

## 2015-03-27 ENCOUNTER — Ambulatory Visit (HOSPITAL_COMMUNITY): Payer: No Typology Code available for payment source | Admitting: Physical Therapy

## 2015-03-27 DIAGNOSIS — R29898 Other symptoms and signs involving the musculoskeletal system: Secondary | ICD-10-CM

## 2015-03-27 DIAGNOSIS — M549 Dorsalgia, unspecified: Secondary | ICD-10-CM | POA: Diagnosis not present

## 2015-03-27 DIAGNOSIS — G8929 Other chronic pain: Secondary | ICD-10-CM

## 2015-03-27 DIAGNOSIS — R2681 Unsteadiness on feet: Secondary | ICD-10-CM

## 2015-03-27 NOTE — Therapy (Signed)
[place], [place], [address] Phone: [phone]   Fax:  [phone]  Physical Therapy Treatment  Patient Details  Name: Andre Holder MRN: [id number] Date of Birth: Jul 26, 1923 Referring Provider:  Reid, [place], MD  Encounter Date: 03/27/2015      PT End of Session - 03/27/15 1155    Visit Number 6   Number of Visits 8   Date for PT Re-Evaluation 04/05/15   Authorization Type [facility] 8 visits   PT Start Time 1100   PT Stop Time 1142   PT Time Calculation (min) 42 min   Activity Tolerance Patient tolerated treatment well   Behavior During Therapy [facility] for tasks assessed/performed      Past Medical History  Diagnosis Date  . Arteriosclerotic cardiovascular disease (ASCVD)     Nonobstructive; 09/2008 50% proximal and 40% mid LAD; 25% circumflex; 30% RCA; mild global LV dysfunction with EF of 45%. No aortic stenosis.  . Mild aortic stenosis     not documented at catheterization; verified by echo in 2011  . Peripheral vascular disease     With a 70% innominate artery stenosis and nonobstructive carotid stenosis  . Hypothyroidism   . Degenerative joint disease     s/p bilateral TKR  . Mobitz (type) II atrioventricular block     With bradycardia; Medtronic pacemaker implanted in 09/2008  . Tobacco abuse, in remission     Remote  . GERD (gastroesophageal reflux disease)   . Hyperlipidemia     Lipid profile in 04/2010:115, 98, 43, 52.  . Weight loss     50 pounds between 1991 and 2011  . 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 Adrenal hyperplasia     Stable on serial imaging  . Anemia     minimal in 2011 with hemoglobin of 12.2 and high normal MCV  . Borderline hypertension     Normal CMet in 2011  . Cancer of larynx     laryngectomy in 1988; postoperative radiation therapy  . Skin cancer     Past Surgical History  Procedure Laterality Date  . Laryngectomy  1988     S/P laryngectomy and radiation therapy  . Appendectomy  1973  . Knee arthroscopy      Left  . Total knee arthroplasty      Bilateral, 19 years ago  . Cataract extraction, bilateral    . Decompression facial nerve      Right median  . Pacemaker insertion    . Insert / replace / remove pacemaker      There were no vitals filed for this visit.  Visit Diagnosis:  Chronic back pain  Leg weakness, bilateral  Unsteady gait      Subjective Assessment - 03/27/15 1152    Subjective Pt reports that he is having increased back pain today. He asked during treatment session when we are going to get his back fixed up.   Currently in Pain? Yes   Pain Score 8    Pain Location Back   Pain Orientation Right              [facility] Adult PT Treatment/Exercise - 03/27/15 0001    Lumbar Exercises: Stretches   Single Knee to Chest Stretch 5 reps   Single Knee to Chest Stretch Limitations 5 reps, 15 seconds each    Lower Trunk Rotation Limitations 2 second hold x 20 reps  Prone Mid Back Stretch 5 reps;10 seconds   Prone Mid Back Stretch Limitations Seated lumbar stretch-swiss ball roll out   Lumbar Exercises: Standing   Other Standing Lumbar Exercises 3D hip excursions x10   Other Standing Lumbar Exercises Standing hip abduction x 10    Lumbar Exercises: Supine   Clam 10 reps;2 seconds   Clam Limitations with green tband   Bridge 10 reps   Lumbar Exercises: Sidelying   Clam 10 reps   Manual Therapy   Manual Therapy Joint mobilization;Soft tissue mobilization   Joint Mobilization Grade I,II to lumbar L2-5    Soft tissue mobilization to lumbar paraspinals, SI ligaments to decrease pain                PT Education - 03/27/15 1155    Education provided Yes   Education Details Seated lumbar flexion stretch added to HEP, pt educated on importance of compliance with HEP   Person(s) Educated Patient   Methods Explanation   Comprehension Verbalized understanding           PT Short Term Goals - 03/09/15 1759    PT SHORT TERM GOAL #1   Title Pt I in HEP   Status On-going   PT SHORT TERM GOAL #2   Title Pt to be able to complete 5 sit to stand in 30 seconds to demonstrate improved power   PT SHORT TERM GOAL #3   Title Pt back pain to have decreased to 5/10   PT SHORT TERM GOAL #4   Title Pt to verbalize the importance of posture in back care   Status On-going           PT Long Term Goals - 03/09/15 1800    PT LONG TERM GOAL #1   Title PT to be I in advance HEP   PT LONG TERM GOAL #2   Title Pt increase LE strength by 1 grade to decrease back pain   Status On-going   PT LONG TERM GOAL #3   Title Pt to demonstrate good posture while walking   Status On-going   PT LONG TERM GOAL #4   Title Pt ROM to be increased by 20% to ease back pain   PT LONG TERM GOAL #5   Title Pt back pain to be no greater than a 3/10 70% of the day               Plan - 03/27/15 1157    Clinical Impression Statement  Pt was educated on the importance of maintianing HEP to support treatment sessions, and he reported that he was afraid to get onto the floor to do his exercises, pt was instructed to complete exercises on his bed. Pt reported decreased pain following treatment, reporting that the greatest relief came from seated lumbar stretch and manual therapy.   PT Next Visit Plan Continue with flexion to decrease back pain, focus on glut strengthening        Problem List Patient Active Problem List   Diagnosis Date Noted  . Malnutrition of moderate degree 01/11/2015  . Small bowel obstruction 12/23/2014  . Chronic systolic congestive heart failure, NYHA class 2 12/23/2014  . SBO (small bowel obstruction) 12/23/2014  . CHF exacerbation 08/31/2014  . Acute on chronic systolic CHF (congestive heart failure) 08/31/2014  . Chest pain 08/31/2014  . Dyspnea 08/31/2014  . CHF (congestive heart failure) 08/25/2013  . Syncope 10/09/2011  . Hyponatremia 10/09/2011   . GERD 06/17/2010  .  WEIGHT LOSS, ABNORMAL 06/17/2010  . ANEMIA 04/08/2010  . Mobitz type II atrioventricular block 04/08/2010  . Hyperlipidemia 10/24/2009  . PACEMAKER, PERMANENT 09/20/2009  . CANCER, LARYNX 03/07/2009  . Aortic valve disorder 03/07/2009  . CEREBROVASCULAR DISEASE 03/07/2009  . PERIPHERAL VASCULAR DISEASE 03/07/2009  . Tobacco abuse, in remission 03/07/2009  . Hypothyroidism 12/27/2008  . OSTEOARTHRITIS 12/27/2008    Hilma Favors, PT, DPT [phone] 03/27/2015, 12:02 PM  [facility] [address] [place], [place], [address] Phone: [phone]   Fax:  [phone]

## 2015-03-29 ENCOUNTER — Ambulatory Visit (HOSPITAL_COMMUNITY): Payer: No Typology Code available for payment source | Admitting: Physical Therapy

## 2015-03-29 DIAGNOSIS — R29898 Other symptoms and signs involving the musculoskeletal system: Secondary | ICD-10-CM

## 2015-03-29 DIAGNOSIS — M549 Dorsalgia, unspecified: Principal | ICD-10-CM

## 2015-03-29 DIAGNOSIS — G8929 Other chronic pain: Secondary | ICD-10-CM

## 2015-03-29 DIAGNOSIS — R2681 Unsteadiness on feet: Secondary | ICD-10-CM

## 2015-03-29 NOTE — Therapy (Signed)
[place], [place], [address] Phone: [phone]   Fax:  [phone]  Physical Therapy Treatment (Reassessment)  Patient Details  Name: Andre Holder MRN: [id number] Date of Birth: 10-Jun-1923 Referring Provider:  Reid, [place], MD  Encounter Date: 03/29/2015      PT End of Session - 03/29/15 1656    Visit Number 7   Number of Visits 8   Date for PT Re-Evaluation 04/05/15   Authorization Type [facility] 8 visits   PT Start Time 1100   PT Stop Time 1141   PT Time Calculation (min) 41 min   Equipment Utilized During Treatment Gait belt   Activity Tolerance Patient tolerated treatment well   Behavior During Therapy [facility] for tasks assessed/performed      Past Medical History  Diagnosis Date  . Arteriosclerotic cardiovascular disease (ASCVD)     Nonobstructive; 09/2008 50% proximal and 40% mid LAD; 25% circumflex; 30% RCA; mild global LV dysfunction with EF of 45%. No aortic stenosis.  . Mild aortic stenosis     not documented at catheterization; verified by echo in 2011  . Peripheral vascular disease     With a 70% innominate artery stenosis and nonobstructive carotid stenosis  . Hypothyroidism   . Degenerative joint disease     s/p bilateral TKR  . Mobitz (type) II atrioventricular block     With bradycardia; Medtronic pacemaker implanted in 09/2008  . Tobacco abuse, in remission     Remote  . GERD (gastroesophageal reflux disease)   . Hyperlipidemia     Lipid profile in 04/2010:115, 98, 43, 52.  . Weight loss     50 pounds between 1991 and 2011  . 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 Adrenal hyperplasia     Stable on serial imaging  . Anemia     minimal in 2011 with hemoglobin of 12.2 and high normal MCV  . Borderline hypertension     Normal CMet in 2011  . Cancer of larynx     laryngectomy in 1988; postoperative radiation therapy  . Skin cancer     Past  Surgical History  Procedure Laterality Date  . Laryngectomy  1988    S/P laryngectomy and radiation therapy  . Appendectomy  1973  . Knee arthroscopy      Left  . Total knee arthroplasty      Bilateral, 19 years ago  . Cataract extraction, bilateral    . Decompression facial nerve      Right median  . Pacemaker insertion    . Insert / replace / remove pacemaker      There were no vitals filed for this visit.  Visit Diagnosis:  Chronic back pain  Leg weakness, bilateral  Unsteady gait      Subjective Assessment - 03/29/15 1103    Subjective Pt reports that he feels that therapy has helped him some. He is able to get in and out of a chair more easily, and he has noticed decreased pain in his back. He reports that his hip is now acting up. 30-   How long can you sit comfortably? 30-60 minutes   How long can you stand comfortably? 5 minutes   How long can you walk comfortably? Pt reports that he is able to walk for a good amount of time with his 4 wheeled walker, but he still has difficulty walking with his cane  Currently in Pain? Yes   Pain Score 6    Pain Location Hip   Pain Orientation Right            [facility] PT Assessment - 03/29/15 0001    Assessment   Medical Diagnosis chronic pain   Onset Date/Surgical Date 04/28/15   Sit to Stand   Comments 5 in 25.32 seconds   AROM   Lumbar Flexion WFL   Lumbar Extension decreased 80%  was decreased 90%   Lumbar - Right Side Bend decreased 20%  was decreased 30%   Lumbar - Left Side Bend decreased 30%  was decreased 30%   Lumbar - Right Rotation decreased 80%   Lumbar - Left Rotation decreased 70%   Strength   Right Hip Flexion 5/5   Right Hip Extension 3-/5  was 2+   Right Hip ABduction 3/5  was 3-   Left Hip Flexion 4/5  was 4/5   Left Hip Extension 3-/5  was 2+/5   Left Hip ABduction 4-/5  was 3-   Right Knee Flexion 4/5  was 3+   Right Knee Extension 5/5   Left Knee Flexion 4-/5  was 3-   Left Knee  Extension 5/5   Right Ankle Dorsiflexion 5/5   Left Ankle Dorsiflexion 4/5  was 3+   Ambulation/Gait   Ambulation/Gait Yes   Ambulation/Gait Assistance 4: Min guard   Gait Pattern Right flexed knee in stance;Left flexed knee in stance;Shuffle;Decreased trunk rotation;Wide base of support;Poor foot clearance - left;Poor foot clearance - right   Standardized Balance Assessment   Standardized Balance Assessment Berg Balance Test   Berg Balance Test   Sit to Stand Able to stand without using hands and stabilize independently   Standing Unsupported Able to stand 2 minutes with supervision   Sitting with Back Unsupported but Feet Supported on Floor or Stool Able to sit safely and securely 2 minutes   Stand to Sit Controls descent by using hands   Transfers Able to transfer safely, definite need of hands   Standing Unsupported with Eyes Closed Able to stand 3 seconds   Standing Ubsupported with Feet Together Able to place feet together independently and stand for 1 minute with supervision   From Standing, Reach Forward with Outstretched Arm Can reach forward >12 cm safely (5")   From Standing Position, Pick up Object from Floor Able to pick up shoe, needs supervision   From Standing Position, Turn to Look Behind Over each Shoulder Turn sideways only but maintains balance   Turn 360 Degrees Needs close supervision or verbal cueing   Standing Unsupported, Alternately Place Feet on Step/Stool Able to complete >2 steps/needs minimal assist   Standing Unsupported, One Foot in Front Able to take small step independently and hold 30 seconds   Standing on One Leg Tries to lift leg/unable to hold 3 seconds but remains standing independently   Total Score 35                 PT Education - 03/29/15 1653    Education Details Educated on pt progress so far, educated on importance of compliance with HEP   Person(s) Educated Patient   Methods Explanation   Comprehension Verbalized understanding           PT Short Term Goals - 03/09/15 1759    PT SHORT TERM GOAL #1   Title Pt I in HEP   Status On-going   PT SHORT TERM GOAL #2   Title  Pt to be able to complete 5 sit to stand in 30 seconds to demonstrate improved power   PT SHORT TERM GOAL #3   Title Pt back pain to have decreased to 5/10   PT SHORT TERM GOAL #4   Title Pt to verbalize the importance of posture in back care   Status On-going           PT Long Term Goals - 03/09/15 1800    PT LONG TERM GOAL #1   Title PT to be I in advance HEP   PT LONG TERM GOAL #2   Title Pt increase LE strength by 1 grade to decrease back pain   Status On-going   PT LONG TERM GOAL #3   Title Pt to demonstrate good posture while walking   Status On-going   PT LONG TERM GOAL #4   Title Pt ROM to be increased by 20% to ease back pain   PT LONG TERM GOAL #5   Title Pt back pain to be no greater than a 3/10 70% of the day               Plan - 03/29/15 1657    Clinical Impression Statement Reassessment was completed today to determine progress with strength, balance, and gait. Pt continues to demonstrate significant impairments in gait and balance, and is at a high risk for falls. His BLE strength and lumbar ROM have improved, but only slightly. Pt will benefit from continued skilled physical therapy in order to  further strengthen core and BLE to decrease back pain, improve posture, and decrease risk for falls.  He continues to demonstrate significant limitations in lumbar ROM due to pain with movement, and will benefit from continued manual therapy and therapeutic exercise/stretching to address this impairment. Without skilled physical therapy, pt is at risk for further functional decline, and will remain at a high risk for falls, which increases risk of hospitalization and increases caregiver burden. It is recommended that pt continue 2x/week x 3 more weeks to improve strength, balance, and gait mechanics.    PT Next Visit Plan  Continue with core/LE strengthening, focus on posture during gait training, progress balance training with unstable surface.         Problem List Patient Active Problem List   Diagnosis Date Noted  . Malnutrition of moderate degree 01/11/2015  . Small bowel obstruction 12/23/2014  . Chronic systolic congestive heart failure, NYHA class 2 12/23/2014  . SBO (small bowel obstruction) 12/23/2014  . CHF exacerbation 08/31/2014  . Acute on chronic systolic CHF (congestive heart failure) 08/31/2014  . Chest pain 08/31/2014  . Dyspnea 08/31/2014  . CHF (congestive heart failure) 08/25/2013  . Syncope 10/09/2011  . Hyponatremia 10/09/2011  . GERD 06/17/2010  . WEIGHT LOSS, ABNORMAL 06/17/2010  . ANEMIA 04/08/2010  . Mobitz type II atrioventricular block 04/08/2010  . Hyperlipidemia 10/24/2009  . PACEMAKER, PERMANENT 09/20/2009  . CANCER, LARYNX 03/07/2009  . Aortic valve disorder 03/07/2009  . CEREBROVASCULAR DISEASE 03/07/2009  . PERIPHERAL VASCULAR DISEASE 03/07/2009  . Tobacco abuse, in remission 03/07/2009  . Hypothyroidism 12/27/2008  . OSTEOARTHRITIS 12/27/2008    Hilma Favors, PT, DPT [id number] 03/29/2015, 5:04 PM  [facility] [address] [place], [place], [address] Phone: [phone]   Fax:  [phone]

## 2015-04-05 ENCOUNTER — Encounter (HOSPITAL_COMMUNITY): Payer: Non-veteran care | Admitting: Physical Therapy

## 2015-04-09 ENCOUNTER — Ambulatory Visit (HOSPITAL_COMMUNITY)
Admission: RE | Admit: 2015-04-09 | Discharge: 2015-04-09 | Disposition: A | Discharge status: Home / Self Care | Payer: Medicare HMO | Source: Ambulatory Visit | Attending: Internal Medicine | Admitting: Internal Medicine

## 2015-04-09 ENCOUNTER — Other Ambulatory Visit (HOSPITAL_COMMUNITY): Payer: Self-pay | Admitting: Internal Medicine

## 2015-04-09 DIAGNOSIS — R1031 Right lower quadrant pain: Secondary | ICD-10-CM

## 2015-04-09 DIAGNOSIS — M545 Low back pain: Secondary | ICD-10-CM | POA: Insufficient documentation

## 2015-04-09 DIAGNOSIS — R933 Abnormal findings on diagnostic imaging of other parts of digestive tract: Secondary | ICD-10-CM | POA: Insufficient documentation

## 2015-04-09 DIAGNOSIS — M25559 Pain in unspecified hip: Secondary | ICD-10-CM

## 2015-04-09 DIAGNOSIS — M4185 Other forms of scoliosis, thoracolumbar region: Secondary | ICD-10-CM | POA: Diagnosis not present

## 2015-04-09 DIAGNOSIS — M25552 Pain in left hip: Secondary | ICD-10-CM | POA: Insufficient documentation

## 2015-04-09 DIAGNOSIS — M25551 Pain in right hip: Secondary | ICD-10-CM | POA: Insufficient documentation

## 2015-04-13 ENCOUNTER — Ambulatory Visit (HOSPITAL_COMMUNITY): Payer: Non-veteran care | Attending: Internal Medicine | Admitting: Physical Therapy

## 2015-04-13 DIAGNOSIS — K6289 Other specified diseases of anus and rectum: Secondary | ICD-10-CM

## 2015-04-13 DIAGNOSIS — R29898 Other symptoms and signs involving the musculoskeletal system: Secondary | ICD-10-CM | POA: Diagnosis present

## 2015-04-13 DIAGNOSIS — G8929 Other chronic pain: Secondary | ICD-10-CM | POA: Diagnosis present

## 2015-04-13 DIAGNOSIS — M549 Dorsalgia, unspecified: Secondary | ICD-10-CM | POA: Diagnosis present

## 2015-04-13 DIAGNOSIS — K624 Stenosis of anus and rectum: Secondary | ICD-10-CM | POA: Diagnosis present

## 2015-04-13 DIAGNOSIS — R2681 Unsteadiness on feet: Secondary | ICD-10-CM | POA: Insufficient documentation

## 2015-04-13 NOTE — Patient Instructions (Signed)
Strengthening: Hip Adduction - Isometric   With ball or folded pillow between knees, squeeze knees together. Hold _3-5___ seconds. Repeat __10__ times per set. Do _1___ sets per session. Do _2___ sessions per day.  http://orth.exer.us/612   Copyright  VHI. All rights reserved.  Sitting to Standing   With straight back, tighten stomach, place right leg back under chair, lean slightly forward and stand. Repeat ___5_ times per set. Do 1____ sets per session. Do __2__ sessions per day.  http://orth.exer.us/1140   Copyright  VHI. All rights reserved.  Functional Quadriceps: Chair Squat   Keeping feet flat on floor, shoulder width apart, squat as low as is comfortable. Use support as necessary. Repeat __10__ times per set. Do __12__ sets per session. Do _2___ sessions per day.  http://orth.exer.us/736   Copyright  VHI. All rights reserved.  Heel Raise: Bilateral (Standing)  At the kitchen counter . Rise on balls of feet. Repeat _10___ times per set. Do _1___ sets per session. Do _2___ sessions per day.  http://orth.exer.us/38   Copyright  VHI. All rights reserved.  Balance: Unilateral   Attempt to balance on left leg, eyes open. Holding onto a kitchen counter.  Hold __5-10__ seconds. Repeat ___5_ times per set. Do __1__ sets per session. Do __2__ sessions per day. Repeat to the right  http://orth.exer.us/28   Copyright  VHI. All rights reserved.  Lifting Principles .Maintain proper posture and head alignment. .Slide object as close as possible before lifting. .Move obstacles out of the way. .Test before lifting; ask for help if too heavy. .Tighten stomach muscles without holding breath. .Use smooth movements; do not jerk. .Use legs to do the work, and pivot with feet. .Distribute the work load symmetrically and close to the center of trunk. .Push instead of pull whenever possible.  Copyright  VHI. All rights reserved.  Log Roll   Lying on back, bend left  knee and place left arm across chest. Roll all in one movement to the right. Reverse to roll to the left. Always move as one unit.   Copyright  VHI. All rights reserved.  Getting Into / Out of Bed   Lower self to lie down on one side by raising legs and lowering head at the same time. Use arms to assist moving without twisting. Bend both knees to roll onto back if desired. To sit up, start from lying on side, and use same move-ments in reverse. Keep trunk aligned with legs.   Copyright  VHI. All rights reserved.

## 2015-04-13 NOTE — Therapy (Addendum)
[place], [place], [address] Phone: [phone]   Fax:  [phone]  Physical Therapy Treatment  Patient Details  Name: Andre Holder MRN: [id number] Date of Birth: Apr 29, 1923 Referring Provider:  Reid, [place], MD  Encounter Date: 04/13/2015      PT End of Session - 04/13/15 1558    Visit Number 8   Number of Visits 8   Date for PT Re-Evaluation 04/05/15   Authorization Type [facility] 8 visits   PT Start Time 1500   PT Stop Time 6962   PT Time Calculation (min) 48 min      Past Medical History  Diagnosis Date  . Arteriosclerotic cardiovascular disease (ASCVD)     Nonobstructive; 09/2008 50% proximal and 40% mid LAD; 25% circumflex; 30% RCA; mild global LV dysfunction with EF of 45%. No aortic stenosis.  . Mild aortic stenosis     not documented at catheterization; verified by echo in 2011  . Peripheral vascular disease     With a 70% innominate artery stenosis and nonobstructive carotid stenosis  . Hypothyroidism   . Degenerative joint disease     s/p bilateral TKR  . Mobitz (type) II atrioventricular block     With bradycardia; Medtronic pacemaker implanted in 09/2008  . Tobacco abuse, in remission     Remote  . GERD (gastroesophageal reflux disease)   . Hyperlipidemia     Lipid profile in 04/2010:115, 98, 43, 52.  . Weight loss     50 pounds between 1991 and 2011  . 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 Adrenal hyperplasia     Stable on serial imaging  . Anemia     minimal in 2011 with hemoglobin of 12.2 and high normal MCV  . Borderline hypertension     Normal CMet in 2011  . Cancer of larynx     laryngectomy in 1988; postoperative radiation therapy  . Skin cancer     Past Surgical History  Procedure Laterality Date  . Laryngectomy  1988    S/P laryngectomy and radiation therapy  . Appendectomy  1973  . Knee arthroscopy      Left  . Total knee  arthroplasty      Bilateral, 19 years ago  . Cataract extraction, bilateral    . Decompression facial nerve      Right median  . Pacemaker insertion    . Insert / replace / remove pacemaker      There were no vitals filed for this visit.  Visit Diagnosis:  Chronic back pain  Leg weakness, bilateral  Unsteady gait  Chronic idiopathic anal pain      Subjective Assessment - 04/13/15 1504    Subjective Andre Holder states that he does some sort of exercise done once a day.    Pertinent History HTN, TKR over 30 years ago,    How long can you sit comfortably? -60 minutes   How long can you stand comfortably? Pt states that he is wobbly when he stands -no change.    How long can you walk comfortably? Pt is walking with his cane about 50% of the time and the walker 50% of the time.   He never walks more than five minutes outside he uses his golf cart.    Currently in Pain? Yes  gets as high as a 10/10    Pain Score 2  Pain Location Hip   Pain Orientation Right   Pain Descriptors / Indicators Aching   Pain Type Chronic pain            [facility] PT Assessment - 04/13/15 0001    Assessment   Medical Diagnosis chronic pain   Onset Date/Surgical Date 04/28/15   Prior Therapy none   Precautions   Precautions Fall   Restrictions   Weight Bearing Restrictions No   Home Environment   Living Environment Private residence   Type of [place] entrance   Prior Function   Level of Independence Independent with household mobility with device   Vocation Retired   Leisure garden   Cognition   Overall Cognitive Status Within Functional Limits for tasks assessed   Functional Tests   Functional tests Sit to Stand   Single Leg Stance   Comments Rt x 7" Lt unable; was unabel B    Sit to Stand   Comments 5 in 30" using cane    Posture/Postural Control   Posture/Postural Control Postural limitations   Postural Limitations Rounded Shoulders;Forward head;Decreased  lumbar lordosis;Increased thoracic kyphosis   AROM   Lumbar Extension decreased 90%   Lumbar - Right Side Bend decreased 30%   Lumbar - Left Side Bend decreased 30%   Lumbar - Right Rotation decreased 80%   Lumbar - Left Rotation decreased 70%   Strength   Right Hip Flexion 4/5   Right Hip Extension 3-/5  was 2+   Right Hip ABduction 4-/5  was 3-/5    Left Hip Flexion 5/5   Left Hip Extension 2+/5   Left Hip ABduction 4-/5  was 3-   Right Knee Flexion 4+/5  was 3+/5    Right Knee Extension 5/5   Left Knee Flexion 4/5  was 3-/5    Left Knee Extension 5/5   Right Ankle Dorsiflexion 4+/5   Left Ankle Dorsiflexion 5/5               [facility] Adult PT Treatment/Exercise - 04/13/15 0001    Lumbar Exercises: Standing   Heel Raises 10 reps   Functional Squats 10 reps   Lumbar Exercises: Seated   Sit to Stand 5 reps   Sit to Stand Limitations thoracic excursion x 5    Lumbar Exercises: Supine   Bridge 10 reps   Other Supine Lumbar Exercises hip adduction isometric x 10    Lumbar Exercises: Prone   Straight Leg Raise 10 reps   Other Prone Lumbar Exercises heel squeeze; rows and shoulder extension x 10                PT Education - 04/13/15 1558    Education provided Yes   Education Details new HEP    Person(s) Educated Patient   Methods Explanation;Demonstration;Handout   Comprehension Verbalized understanding;Returned demonstration          PT Short Term Goals - 04/13/15 1601    PT SHORT TERM GOAL #1   Title Pt I in HEP   Time 1   Period Weeks   Status Achieved   PT SHORT TERM GOAL #2   Title Pt to be able to complete 5 sit to stand in 30 seconds to demonstrate improved power   Time 2   Period Weeks   Status Achieved   PT SHORT TERM GOAL #3   Title Pt back pain to have decreased to 5/10   Time 2   Period Weeks  Status Achieved   PT SHORT TERM GOAL #4   Title Pt to verbalize the importance of posture in back care   Time 2   Period Weeks    Status On-going           PT Long Term Goals - 04/13/15 1601    PT LONG TERM GOAL #1   Title PT to be I in advance HEP   Time 4   Period Weeks   Status Achieved   PT LONG TERM GOAL #2   Title Pt increase LE strength by 1 grade to decrease back pain   Time 4   Period Weeks   Status Achieved   PT LONG TERM GOAL #3   Title Pt to demonstrate good posture while walking   Time 4   Period Weeks   Status On-going   PT LONG TERM GOAL #4   Title Pt ROM to be increased by 20% to ease back pain   Time 4   Period Weeks   Status On-going   PT LONG TERM GOAL #5   Title Pt back pain to be no greater than a 3/10 70% of the day   Time 4   Period Weeks   Status On-going               Plan - 04/13/15 1558    Clinical Impression Statement Pt has improved in strength.  He feels he would benefit from more therapy and is going to contact the [place] to see if they would approve more sessions.  Added sit to stand; heel raises and functional squats to pt HEP.    PT Frequency 2x / week   PT Duration --  7 weeks for an additional 3 weeks    PT Treatment/Interventions Therapeutic exercise;Therapeutic activities;Functional mobility training;Gait training   PT Next Visit Plan Continue with core/LE strengthening, focus on posture during gait training, progress balance training with unstable surface if [facility] appro[facility]         Problem List Patient Active Problem List   Diagnosis Date Noted  . Malnutrition of moderate degree 01/11/2015  . Small bowel obstruction 12/23/2014  . Chronic systolic congestive heart failure, NYHA class 2 12/23/2014  . SBO (small bowel obstruction) 12/23/2014  . CHF exacerbation 08/31/2014  . Acute on chronic systolic CHF (congestive heart failure) 08/31/2014  . Chest pain 08/31/2014  . Dyspnea 08/31/2014  . CHF (congestive heart failure) 08/25/2013  . Syncope 10/09/2011  . Hyponatremia 10/09/2011  . GERD 06/17/2010  . WEIGHT LOSS, ABNORMAL 06/17/2010  . ANEMIA  04/08/2010  . Mobitz type II atrioventricular block 04/08/2010  . Hyperlipidemia 10/24/2009  . PACEMAKER, PERMANENT 09/20/2009  . CANCER, LARYNX 03/07/2009  . Aortic valve disorder 03/07/2009  . CEREBROVASCULAR DISEASE 03/07/2009  . PERIPHERAL VASCULAR DISEASE 03/07/2009  . Tobacco abuse, in remission 03/07/2009  . Hypothyroidism 12/27/2008  . OSTEOARTHRITIS 12/27/2008   Rayetta Humphrey, PT CLT [phone] 04/13/2015, 4:04 PM  [facility] [address] [place], [place], [address] Phone: [phone]   Fax:  [phone]    PHYSICAL THERAPY DISCHARGE SUMMARY  Visits from Start of Care: 8  Current functional level related to goals / functional outcomes: As above   Remaining deficits: As above   Education / Equipment: As above  Plan: Patient agrees to discharge.  Patient goals were not met. Patient is being discharged due to meeting the stated rehab goals.  ?????    Rayetta Humphrey, Naomi CLT [phone]

## 2015-04-20 ENCOUNTER — Telehealth (HOSPITAL_COMMUNITY): Payer: Self-pay | Admitting: Physical Therapy

## 2015-04-20 NOTE — Telephone Encounter (Signed)
Dr. Joneen Caraway called to clairfy the amount of time we are requesting to extend tx for this pt. She will approve 3xw for 4 weeks and will fax the progress note back to [place] this afternoon.

## 2015-05-31 ENCOUNTER — Other Ambulatory Visit (HOSPITAL_COMMUNITY): Payer: Self-pay | Admitting: Internal Medicine

## 2015-05-31 ENCOUNTER — Ambulatory Visit (HOSPITAL_COMMUNITY)
Admission: RE | Admit: 2015-05-31 | Discharge: 2015-05-31 | Disposition: A | Discharge status: Home / Self Care | Payer: Medicare HMO | Source: Ambulatory Visit | Attending: Internal Medicine | Admitting: Internal Medicine

## 2015-05-31 DIAGNOSIS — R209 Unspecified disturbances of skin sensation: Secondary | ICD-10-CM

## 2015-05-31 DIAGNOSIS — R208 Other disturbances of skin sensation: Secondary | ICD-10-CM | POA: Insufficient documentation

## 2015-05-31 DIAGNOSIS — R911 Solitary pulmonary nodule: Secondary | ICD-10-CM | POA: Diagnosis not present

## 2015-06-04 ENCOUNTER — Other Ambulatory Visit (HOSPITAL_COMMUNITY): Payer: Medicare HMO

## 2015-11-02 ENCOUNTER — Encounter (INDEPENDENT_AMBULATORY_CARE_PROVIDER_SITE_OTHER): Payer: Self-pay | Admitting: *Deleted

## 2015-11-05 ENCOUNTER — Encounter (HOSPITAL_COMMUNITY): Payer: Self-pay | Admitting: *Deleted

## 2015-11-05 ENCOUNTER — Inpatient Hospital Stay (HOSPITAL_COMMUNITY)
Admission: EM | Admit: 2015-11-05 | Discharge: 2015-11-07 | DRG: 389 | Disposition: A | Discharge status: Home / Self Care | Payer: Medicare HMO | Attending: Internal Medicine | Admitting: Internal Medicine

## 2015-11-05 ENCOUNTER — Emergency Department (HOSPITAL_COMMUNITY): Payer: Medicare HMO

## 2015-11-05 DIAGNOSIS — Z85828 Personal history of other malignant neoplasm of skin: Secondary | ICD-10-CM

## 2015-11-05 DIAGNOSIS — I350 Nonrheumatic aortic (valve) stenosis: Secondary | ICD-10-CM | POA: Diagnosis present

## 2015-11-05 DIAGNOSIS — Z87891 Personal history of nicotine dependence: Secondary | ICD-10-CM | POA: Diagnosis not present

## 2015-11-05 DIAGNOSIS — K56600 Partial intestinal obstruction, unspecified as to cause: Secondary | ICD-10-CM | POA: Diagnosis present

## 2015-11-05 DIAGNOSIS — Z833 Family history of diabetes mellitus: Secondary | ICD-10-CM

## 2015-11-05 DIAGNOSIS — I2510 Atherosclerotic heart disease of native coronary artery without angina pectoris: Secondary | ICD-10-CM | POA: Diagnosis present

## 2015-11-05 DIAGNOSIS — D649 Anemia, unspecified: Secondary | ICD-10-CM | POA: Diagnosis not present

## 2015-11-05 DIAGNOSIS — Z806 Family history of leukemia: Secondary | ICD-10-CM

## 2015-11-05 DIAGNOSIS — K5669 Other intestinal obstruction: Secondary | ICD-10-CM

## 2015-11-05 DIAGNOSIS — Z96653 Presence of artificial knee joint, bilateral: Secondary | ICD-10-CM | POA: Diagnosis present

## 2015-11-05 DIAGNOSIS — I359 Nonrheumatic aortic valve disorder, unspecified: Secondary | ICD-10-CM

## 2015-11-05 DIAGNOSIS — E785 Hyperlipidemia, unspecified: Secondary | ICD-10-CM | POA: Diagnosis present

## 2015-11-05 DIAGNOSIS — Z9002 Acquired absence of larynx: Secondary | ICD-10-CM

## 2015-11-05 DIAGNOSIS — Z8521 Personal history of malignant neoplasm of larynx: Secondary | ICD-10-CM

## 2015-11-05 DIAGNOSIS — E039 Hypothyroidism, unspecified: Secondary | ICD-10-CM | POA: Diagnosis present

## 2015-11-05 DIAGNOSIS — Z930 Tracheostomy status: Secondary | ICD-10-CM

## 2015-11-05 DIAGNOSIS — Z7982 Long term (current) use of aspirin: Secondary | ICD-10-CM

## 2015-11-05 DIAGNOSIS — Z823 Family history of stroke: Secondary | ICD-10-CM

## 2015-11-05 DIAGNOSIS — I5022 Chronic systolic (congestive) heart failure: Secondary | ICD-10-CM | POA: Diagnosis present

## 2015-11-05 DIAGNOSIS — I739 Peripheral vascular disease, unspecified: Secondary | ICD-10-CM | POA: Diagnosis present

## 2015-11-05 DIAGNOSIS — K219 Gastro-esophageal reflux disease without esophagitis: Secondary | ICD-10-CM | POA: Diagnosis present

## 2015-11-05 DIAGNOSIS — R1031 Right lower quadrant pain: Secondary | ICD-10-CM | POA: Diagnosis present

## 2015-11-05 LAB — COMPREHENSIVE METABOLIC PANEL
ALK PHOS: 84 U/L (ref 38–126)
ALT: 17 U/L (ref 17–63)
AST: 26 U/L (ref 15–41)
Albumin: 3.6 g/dL (ref 3.5–5.0)
Anion gap: 8 (ref 5–15)
BILIRUBIN TOTAL: 0.5 mg/dL (ref 0.3–1.2)
BUN: 34 mg/dL — ABNORMAL HIGH (ref 6–20)
CALCIUM: 8.7 mg/dL — AB (ref 8.9–10.3)
CO2: 26 mmol/L (ref 22–32)
Chloride: 102 mmol/L (ref 101–111)
Creatinine, Ser: 0.85 mg/dL (ref 0.61–1.24)
GFR calc non Af Amer: >60 mL/min (ref >60)
Glucose, Bld: 116 mg/dL — ABNORMAL HIGH (ref 65–99)
Potassium: 4.1 mmol/L (ref 3.5–5.1)
Sodium: 136 mmol/L (ref 135–145)
Total Protein: 6.8 g/dL (ref 6.5–8.1)

## 2015-11-05 LAB — URINALYSIS, ROUTINE W REFLEX MICROSCOPIC
Bilirubin Urine: NEGATIVE
Glucose, UA: NEGATIVE mg/dL
Ketones, ur: NEGATIVE mg/dL
LEUKOCYTES UA: NEGATIVE
NITRITE: NEGATIVE
Specific Gravity, Urine: 1.01 (ref 1.005–1.030)
pH: 6 (ref 5.0–8.0)

## 2015-11-05 LAB — RETICULOCYTES
RBC.: 3.24 MIL/uL — ABNORMAL LOW (ref 4.22–5.81)
Retic Count, Absolute: 32.4 10*3/uL (ref 19.0–186.0)
Retic Ct Pct: 1 % (ref 0.4–3.1)

## 2015-11-05 LAB — CBC WITH DIFFERENTIAL/PLATELET
Basophils Absolute: 0 10*3/uL (ref 0.0–0.1)
Basophils Relative: 1 %
EOS ABS: 0.1 10*3/uL (ref 0.0–0.7)
EOS PCT: 2 %
HCT: 32.3 % — ABNORMAL LOW (ref 39.0–52.0)
Hemoglobin: 11.1 g/dL — ABNORMAL LOW (ref 13.0–17.0)
Lymphocytes Relative: 11 %
Lymphs Abs: 0.7 10*3/uL (ref 0.7–4.0)
MCH: 33.9 pg (ref 26.0–34.0)
MCHC: 34.4 g/dL (ref 30.0–36.0)
MCV: 98.8 fL (ref 78.0–100.0)
MONOS PCT: 7 %
Monocytes Absolute: 0.5 10*3/uL (ref 0.1–1.0)
Neutro Abs: 5.3 10*3/uL (ref 1.7–7.7)
Neutrophils Relative %: 79 %
Platelets: 223 10*3/uL (ref 150–400)
RBC: 3.27 MIL/uL — ABNORMAL LOW (ref 4.22–5.81)
RDW: 13.6 % (ref 11.5–15.5)
WBC: 6.6 10*3/uL (ref 4.0–10.5)

## 2015-11-05 LAB — LIPASE, BLOOD: LIPASE: 33 U/L (ref 11–51)

## 2015-11-05 LAB — URINE MICROSCOPIC-ADD ON: Squamous Epithelial / LPF: NONE SEEN

## 2015-11-05 LAB — FOLATE: Folate: 24.9 ng/mL (ref >5.9)

## 2015-11-05 LAB — FERRITIN: FERRITIN: 80 ng/mL (ref 24–336)

## 2015-11-05 LAB — IRON AND TIBC
IRON: 56 ug/dL (ref 45–182)
SATURATION RATIOS: 16 % — AB (ref 17.9–39.5)
TIBC: 342 ug/dL (ref 250–450)
UIBC: 286 ug/dL

## 2015-11-05 LAB — VITAMIN B12: Vitamin B-12: 980 pg/mL — ABNORMAL HIGH (ref 180–914)

## 2015-11-05 MED ORDER — ONDANSETRON HCL 4 MG/2ML IJ SOLN: 4.0000 mg | Freq: Four times a day (QID) | INTRAMUSCULAR | Status: DC | PRN

## 2015-11-05 MED ORDER — MAGNESIUM OXIDE 400 (241.3 MG) MG PO TABS
400.0000 mg | ORAL_TABLET | Freq: Two times a day (BID) | ORAL | Status: DC
  Administered 2015-11-05 – 2015-11-07 (×5): 400 mg via ORAL
  Filled 2015-11-05 (×5): qty 1

## 2015-11-05 MED ORDER — ONDANSETRON HCL 4 MG PO TABS: 4.0000 mg | ORAL_TABLET | Freq: Four times a day (QID) | ORAL | Status: DC | PRN

## 2015-11-05 MED ORDER — TAMSULOSIN HCL 0.4 MG PO CAPS
0.4000 mg | ORAL_CAPSULE | Freq: Two times a day (BID) | ORAL | Status: DC
  Administered 2015-11-05 – 2015-11-07 (×5): 0.4 mg via ORAL
  Filled 2015-11-05 (×5): qty 1

## 2015-11-05 MED ORDER — ACETAMINOPHEN 325 MG PO TABS: 650.0000 mg | ORAL_TABLET | Freq: Four times a day (QID) | ORAL | Status: DC | PRN

## 2015-11-05 MED ORDER — FENTANYL CITRATE (PF) 100 MCG/2ML IJ SOLN
50.0000 ug | Freq: Once | INTRAMUSCULAR | Status: AC
  Administered 2015-11-05: 50 ug via INTRAVENOUS
  Filled 2015-11-05: qty 2

## 2015-11-05 MED ORDER — SODIUM CHLORIDE 0.9 % IV SOLN
INTRAVENOUS | Status: DC
  Administered 2015-11-05 (×2): via INTRAVENOUS

## 2015-11-05 MED ORDER — ALUM & MAG HYDROXIDE-SIMETH 200-200-20 MG/5ML PO SUSP: 30.0000 mL | Freq: Four times a day (QID) | ORAL | Status: DC | PRN

## 2015-11-05 MED ORDER — IOHEXOL 300 MG/ML  SOLN
100.0000 mL | Freq: Once | INTRAMUSCULAR | Status: AC | PRN
  Administered 2015-11-05: 100 mL via INTRAVENOUS

## 2015-11-05 MED ORDER — ONDANSETRON HCL 4 MG/2ML IJ SOLN
4.0000 mg | Freq: Once | INTRAMUSCULAR | Status: AC
  Administered 2015-11-05: 4 mg via INTRAVENOUS
  Filled 2015-11-05: qty 2

## 2015-11-05 MED ORDER — HYDROMORPHONE HCL 1 MG/ML IJ SOLN: 0.5000 mg | INTRAMUSCULAR | Status: DC | PRN

## 2015-11-05 MED ORDER — ASPIRIN EC 81 MG PO TBEC
81.0000 mg | DELAYED_RELEASE_TABLET | Freq: Every day | ORAL | Status: DC
  Administered 2015-11-05 – 2015-11-07 (×3): 81 mg via ORAL
  Filled 2015-11-05 (×3): qty 1

## 2015-11-05 MED ORDER — MAGNESIUM HYDROXIDE 400 MG/5ML PO SUSP
30.0000 mL | Freq: Two times a day (BID) | ORAL | Status: DC
  Administered 2015-11-05 – 2015-11-07 (×5): 30 mL via ORAL
  Filled 2015-11-05 (×5): qty 30

## 2015-11-05 MED ORDER — FINASTERIDE 5 MG PO TABS
5.0000 mg | ORAL_TABLET | Freq: Every day | ORAL | Status: DC
  Administered 2015-11-05 – 2015-11-06 (×2): 5 mg via ORAL
  Filled 2015-11-05 (×4): qty 1

## 2015-11-05 MED ORDER — ACETAMINOPHEN 650 MG RE SUPP: 650.0000 mg | Freq: Four times a day (QID) | RECTAL | Status: DC | PRN

## 2015-11-05 MED ORDER — OXYCODONE HCL 5 MG PO TABS
5.0000 mg | ORAL_TABLET | ORAL | Status: DC | PRN
  Administered 2015-11-05 – 2015-11-07 (×4): 5 mg via ORAL
  Filled 2015-11-05 (×4): qty 1

## 2015-11-05 MED ORDER — LEVOTHYROXINE SODIUM 75 MCG PO TABS
175.0000 ug | ORAL_TABLET | Freq: Every day | ORAL | Status: DC
  Administered 2015-11-05 – 2015-11-06 (×2): 175 ug via ORAL
  Filled 2015-11-05 (×2): qty 1

## 2015-11-05 NOTE — Consult Note (Signed)
Reason for Consult: Partial small bowel obstruction Referring Physician: Dr. Asencion Noble  Andre Holder is an 82 y.o. male.  HPI: Patient is a 82 year old white male with multiple medical problems who presented with nonspecific abdominal pain for the past 2 days. He states he had a bowel movement approximately 2 days ago. He presented to the emergency room and a CAT scan of the abdomen revealed a partial small bowel obstruction secondary most likely to adhesive disease in the right lower quadrant. He last had an episode of this in April 2016 which resolved without surgery. Patient states currently that since his admission, his abdominal pain has resolved.  Past Medical History  Diagnosis Date  . Arteriosclerotic cardiovascular disease (ASCVD)     Nonobstructive; 09/2008 50% proximal and 40% mid LAD; 25% circumflex; 30% RCA; mild global LV dysfunction with EF of 45%. No aortic stenosis.  . Mild aortic stenosis     not documented at catheterization; verified by echo in 2011  . Peripheral vascular disease ([place])     With a 70% innominate artery stenosis and nonobstructive carotid stenosis  . Hypothyroidism   . Degenerative joint disease     s/p bilateral TKR  . Mobitz (type) II atrioventricular block     With bradycardia; Medtronic pacemaker implanted in 09/2008  . Tobacco abuse, in remission     Remote  . GERD (gastroesophageal reflux disease)   . Hyperlipidemia     Lipid profile in 04/2010:115, 98, 43, 52.  . Weight loss     50 pounds between 1991 and 2011  . 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 Adrenal hyperplasia ([place])     Stable on serial imaging  . Anemia     minimal in 2011 with hemoglobin of 12.2 and high normal MCV  . Borderline hypertension     Normal CMet in 2011  . Cancer of larynx ([place])     laryngectomy in 1988; postoperative radiation therapy  . Skin cancer     Past Surgical History  Procedure Laterality Date  .  Laryngectomy  1988    S/P laryngectomy and radiation therapy  . Appendectomy  1973  . Knee arthroscopy      Left  . Total knee arthroplasty      Bilateral, 19 years ago  . Cataract extraction, bilateral    . Decompression facial nerve      Right median  . Pacemaker insertion    . Insert / replace / remove pacemaker      Family History  Problem Relation Age of Onset  . Stroke Mother   . Leukemia Father   . Colon cancer Neg Hx   . Liver disease Neg Hx   . GI problems Neg Hx   . Stroke Other   . Diabetes Other     Social History:  reports that he has quit smoking. His smoking use included Cigarettes. He quit smokeless tobacco use about 33 years ago. He reports that he does not drink alcohol or use illicit drugs.  Allergies: No Known Allergies  Medications: I have reviewed the patient's current medications.  Results for orders placed or performed during the hospital encounter of 11/05/15 (from the past 48 hour(s))  Comprehensive metabolic panel     Status: Abnormal   Collection Time: 11/05/15  2:20 AM  Result Value Ref Range   Sodium 136 135 - 145 mmol/L   Potassium 4.1 3.5 - 5.1 mmol/L  Chloride 102 101 - 111 mmol/L   CO2 26 22 - 32 mmol/L   Glucose, Bld 116 (H) 65 - 99 mg/dL   BUN 34 (H) 6 - 20 mg/dL   Creatinine, Ser 0.85 0.61 - 1.24 mg/dL   Calcium 8.7 (L) 8.9 - 10.3 mg/dL   Total Protein 6.8 6.5 - 8.1 g/dL   Albumin 3.6 3.5 - 5.0 g/dL   AST 26 15 - 41 U/L   ALT 17 17 - 63 U/L   Alkaline Phosphatase 84 38 - 126 U/L   Total Bilirubin 0.5 0.3 - 1.2 mg/dL   GFR calc non Af Amer >60 >60 mL/min   GFR calc Af Amer >60 >60 mL/min    Comment: (NOTE) The eGFR has been calculated using the CKD EPI equation. This calculation has not been validated in all clinical situations. eGFR's persistently <60 mL/min signify possible Chronic Kidney Disease.    Anion gap 8 5 - 15  CBC with Differential     Status: Abnormal   Collection Time: 11/05/15  2:20 AM  Result Value Ref  Range   WBC 6.6 4.0 - 10.5 K/uL   RBC 3.27 (L) 4.22 - 5.81 MIL/uL   Hemoglobin 11.1 (L) 13.0 - 17.0 g/dL   HCT 32.3 (L) 39.0 - 52.0 %   MCV 98.8 78.0 - 100.0 fL   MCH 33.9 26.0 - 34.0 pg   MCHC 34.4 30.0 - 36.0 g/dL   RDW 13.6 11.5 - 15.5 %   Platelets 223 150 - 400 K/uL   Neutrophils Relative % 79 %   Neutro Abs 5.3 1.7 - 7.7 K/uL   Lymphocytes Relative 11 %   Lymphs Abs 0.7 0.7 - 4.0 K/uL   Monocytes Relative 7 %   Monocytes Absolute 0.5 0.1 - 1.0 K/uL   Eosinophils Relative 2 %   Eosinophils Absolute 0.1 0.0 - 0.7 K/uL   Basophils Relative 1 %   Basophils Absolute 0.0 0.0 - 0.1 K/uL  Lipase, blood     Status: None   Collection Time: 11/05/15  2:20 AM  Result Value Ref Range   Lipase 33 11 - 51 U/L  Reticulocytes     Status: Abnormal   Collection Time: 11/05/15  5:45 AM  Result Value Ref Range   Retic Ct Pct 1.0 0.4 - 3.1 %   RBC. 3.24 (L) 4.22 - 5.81 MIL/uL   Retic Count, Manual 32.4 19.0 - 186.0 K/uL  Urinalysis, Routine w reflex microscopic     Status: Abnormal   Collection Time: 11/05/15  9:25 AM  Result Value Ref Range   Color, Urine YELLOW YELLOW   APPearance CLEAR CLEAR   Specific Gravity, Urine 1.010 1.005 - 1.030   pH 6.0 5.0 - 8.0   Glucose, UA NEGATIVE NEGATIVE mg/dL   Hgb urine dipstick TRACE (A) NEGATIVE   Bilirubin Urine NEGATIVE NEGATIVE   Ketones, ur NEGATIVE NEGATIVE mg/dL   Protein, ur TRACE (A) NEGATIVE mg/dL   Nitrite NEGATIVE NEGATIVE   Leukocytes, UA NEGATIVE NEGATIVE  Urine microscopic-add on     Status: Abnormal   Collection Time: 11/05/15  9:25 AM  Result Value Ref Range   Squamous Epithelial / LPF NONE SEEN NONE SEEN   WBC, UA 0-5 0 - 5 WBC/hpf   RBC / HPF 0-5 0 - 5 RBC/hpf   Bacteria, UA RARE (A) NONE SEEN    Ct Abdomen Pelvis W Contrast  11/05/2015  CLINICAL DATA:  Acute onset of right lower quadrant abdominal pain.  Initial encounter. EXAM: CT ABDOMEN AND PELVIS WITH CONTRAST TECHNIQUE: Multidetector CT imaging of the abdomen and  pelvis was performed using the standard protocol following bolus administration of intravenous contrast. CONTRAST:  169m OMNIPAQUE IOHEXOL 300 MG/ML  SOLN COMPARISON:  CT of the abdomen and pelvis from 01/11/2015 FINDINGS: The ascending thoracic aorta measures 4.1 cm in AP dimension, within normal limits given the patient's age. Bibasilar atelectasis or scarring is noted. Diffuse coronary artery calcifications are seen. Pacemaker leads are partially imaged. Calcification is noted at the aortic valve. The liver and spleen are unremarkable in appearance. The gallbladder is within normal limits. The pancreas and right adrenal gland are unremarkable. A mildly heterogeneous 2.4 cm left adrenal nodule is noted. The kidneys are unremarkable in appearance. There is no evidence of hydronephrosis. No renal or ureteral stones are seen. Mild nonspecific perinephric stranding is noted bilaterally. There is dilatation of small bowel loops up to 4.0 cm in maximal diameter, with focal transition point at the right hemipelvis, and relatively decompressed distal loops. This is suggestive of partial small-bowel obstruction. A small amount of fluid is still seen within the distal ileum. The stomach is within normal limits. No acute vascular abnormalities are seen. The patient is status post appendectomy. The colon is unremarkable in appearance. The bladder is mildly distended and grossly unremarkable. The prostate is borderline enlarged, measuring 4.9 cm in transverse dimension, with minimal calcification. No inguinal lymphadenopathy is seen. Soft tissue density and minimal calcification adjacent to the left inguinal canal may reflect an undescended left testis. This is grossly unchanged from 2006. No acute osseous abnormalities are identified. There is grade 2 anterolisthesis of L5 on S1, reflecting chronic bilateral pars defects at L5. Multilevel vacuum phenomenon is noted along the lower thoracic and lumbar spine. IMPRESSION: 1.  Dilatation of small bowel loops up to 4.0 cm in maximal diameter, with focal transition point at the right hemipelvis, and relatively decompressed distal loops, though a small amount of fluid is still seen in the distal ileum. This is suggestive of partial small bowel obstruction, possibly due to an adhesion. 2. Diffuse coronary artery calcifications seen. Calcification at the aortic valve. 3. Bibasilar atelectasis or scarring noted. 4. Mildly heterogeneous 2.4 cm left adrenal nodule is relatively stable from 2006 and likely benign. 5. Borderline enlarged prostate. 6. Soft tissue density and minimal calcification adjacent to the left inguinal canal may reflect an undescended left testis. This is grossly unchanged from 2006. 7. Grade 2 anterolisthesis of L5 on S1, reflecting chronic bilateral pars defects at L5. Mild diffuse degenerative change along the lower thoracic and lumbar spine. Electronically Signed   By: JGarald BaldingM.D.   On: 11/05/2015 03:57    ROS: See chart Blood pressure 96/61, pulse 64, temperature 98.1 F (36.7 C), temperature source Oral, resp. rate 18, height 6' 2.5" (1.892 m), weight 72.576 kg (160 lb), SpO2 95 %. Physical Exam: Pleasant white male who appears comfortable, talking with an artificial larynx due to a history of laryngectomy in the remote past. Abdomen is soft, nontender, nondistended. No hepatosplenomegaly, masses, or hernias identified. No rigidity is noted.  Assessment/Plan: Impression: Partial small bowel obstruction. No need for acute surgical intervention this time. Patient is at significant risk for general anesthesia. Should he require surgery, he would have to be done at a tertiary care center. Hopefully it will not come to this. We'll start him on sips of clears.  Karalynn Cottone A 11/05/2015, 9:52 AM

## 2015-11-05 NOTE — Care Management Note (Signed)
Case Management Note  Patient Details  Name: Andre Holder MRN: [id number] Date of Birth: 1923-03-02  Subjective/Objective:                  Pt admitted with partial small bowel obstruction. Pt is from home and ind with ADL's. Pt plans to return home with self care at DC. Pt is connected with [facility]. Cecille Rubin, of [place], made aware of admission and faxed pt info.   Action/Plan: Will cont to follow, will cont to provide [facility] with updates.   Expected Discharge Date:     2/29/2017             Expected Discharge Plan:  Home/Self Care  In-House Referral:  NA  Discharge planning Services  CM Consult  Post Acute Care Choice:  NA Choice offered to:  NA  DME Arranged:    DME Agency:     HH Arranged:    HH Agency:     Status of Service:  In process, will continue to follow  Medicare Important Message Given:    Date Medicare IM Given:    Medicare IM give by:    Date Additional Medicare IM Given:    Additional Medicare Important Message give by:     If discussed at [place] of Stay Meetings, dates discussed:    Additional Comments:  Sherald Barge, RN 11/05/2015, 1:59 PM

## 2015-11-05 NOTE — H&P (Addendum)
[facility] Admission History and Physical       Andre Holder [id number] DOB: 09/19/1922 DOA: 11/05/2015  Referring physician: EDP PCP: Asencion Noble, MD  Specialists:   Chief Complaint: Nausea and Vomiting  HPI: Andre Holder is a 82 y.o. male with a history of Laryngeal Cancer S/P Laryngectomy and Tracheostomy placement, ASCVD, Aortic Stenosis, systolic CHF, and Hypothyroid who presents to the ED with complaints of nausea and vomiting after his dinner meal.   He denies any ABD Pain or Fevers or Chills.   He was evaluated in the ED and a CT scan of the ABD was performed and revealed a partial SBO.  He was referred for medical admission.      Review of Systems:  Constitutional: No Weight Loss, No Weight Gain, Night Sweats, Fevers, Chills, Dizziness, Light Headedness, Fatigue, or Generalized Weakness HEENT: No Headaches, Difficulty Swallowing,Tooth/Dental Problems,Sore Throat,  No Sneezing, Rhinitis, Ear Ache, Nasal Congestion, or Post Nasal Drip,  Cardio-vascular:  No Chest pain, Orthopnea, PND, Edema in Lower Extremities, Anasarca, Dizziness, Palpitations  Resp: No Dyspnea, No DOE, No Productive Cough, No Non-Productive Cough, No Hemoptysis, No Wheezing.    GI: No Heartburn, Indigestion, Abdominal Pain, +Nausea, +Vomiting, Diarrhea, Constipation, Hematemesis, Hematochezia, Melena, Change in Bowel Habits,  Loss of Appetite  GU: No Dysuria, No Change in Color of Urine, No Urgency or Urinary Frequency, No Flank pain.  Musculoskeletal: No Joint Pain or Swelling, No Decreased Range of Motion, No Back Pain.  Neurologic: No Syncope, No Seizures, Muscle Weakness, Paresthesia, Vision Disturbance or Loss, No Diplopia, No Vertigo, No Difficulty Walking,  Skin: No Rash or Lesions. Psych: No Change in Mood or Affect, No Depression or Anxiety, No Memory loss, No Confusion, or Hallucinations   Past Medical History  Diagnosis Date  . Arteriosclerotic cardiovascular disease (ASCVD)    Nonobstructive; 09/2008 50% proximal and 40% mid LAD; 25% circumflex; 30% RCA; mild global LV dysfunction with EF of 45%. No aortic stenosis.  . Mild aortic stenosis     not documented at catheterization; verified by echo in 2011  . Peripheral vascular disease ([place])     With a 70% innominate artery stenosis and nonobstructive carotid stenosis  . Hypothyroidism   . Degenerative joint disease     s/p bilateral TKR  . Mobitz (type) II atrioventricular block     With bradycardia; Medtronic pacemaker implanted in 09/2008  . Tobacco abuse, in remission     Remote  . GERD (gastroesophageal reflux disease)   . Hyperlipidemia     Lipid profile in 04/2010:115, 98, 43, 52.  . Weight loss     50 pounds between 1991 and 2011  . 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 Adrenal hyperplasia ([place])     Stable on serial imaging  . Anemia     minimal in 2011 with hemoglobin of 12.2 and high normal MCV  . Borderline hypertension     Normal CMet in 2011  . Cancer of larynx ([place])     laryngectomy in 1988; postoperative radiation therapy  . Skin cancer      Past Surgical History  Procedure Laterality Date  . Laryngectomy  1988    S/P laryngectomy and radiation therapy  . Appendectomy  1973  . Knee arthroscopy      Left  . Total knee arthroplasty      Bilateral, 19 years ago  . Cataract extraction, bilateral    . Decompression  facial nerve      Right median  . Pacemaker insertion    . Insert / replace / remove pacemaker        Prior to Admission medications   Medication Sig Start Date End Date Taking? Authorizing Provider  aspirin EC 81 MG tablet Take 81 mg by mouth daily.    Historical Provider, MD  B Complex-C (B-COMPLEX WITH VITAMIN C) tablet Take 1 tablet by mouth daily.    Historical Provider, MD  docusate sodium (COLACE) 100 MG capsule Take 100 mg by mouth 2 (two) times daily.    Historical Provider, MD  finasteride (PROSCAR) 5 MG tablet Take 5  mg by mouth at bedtime.    Historical Provider, MD  furosemide (LASIX) 40 MG tablet Take 1 tablet (40 mg total) by mouth daily. Patient taking differently: Take 20 mg by mouth daily.  09/05/14   Asencion Noble, MD  levothyroxine (SYNTHROID, LEVOTHROID) 175 MCG tablet Take 175 mcg by mouth every evening.     Historical Provider, MD  magnesium oxide (MAG-OX) 400 MG tablet Take 400 mg by mouth 2 (two) times daily.    Historical Provider, MD  Melatonin 3 MG TABS Take 2 tablets by mouth daily.    Historical Provider, MD  mirtazapine (REMERON) 15 MG tablet Take 1 tablet by mouth daily. 01/04/15   Historical Provider, MD  omeprazole (PRILOSEC) 20 MG capsule Take 20 mg by mouth daily.    Historical Provider, MD  ropinirole (REQUIP) 5 MG tablet Take 5 mg by mouth at bedtime.    Historical Provider, MD  Tamsulosin HCl (FLOMAX) 0.4 MG CAPS Take 0.4 mg by mouth 2 (two) times daily.     Historical Provider, MD     No Known Allergies  Social History:  reports that he has quit smoking. His smoking use included Cigarettes. He quit smokeless tobacco use about 33 years ago. He reports that he does not drink alcohol or use illicit drugs.    Family History  Problem Relation Age of Onset  . Stroke Mother   . Leukemia Father   . Colon cancer Neg Hx   . Liver disease Neg Hx   . GI problems Neg Hx   . Stroke Other   . Diabetes Other        Physical Exam:  GEN:  Pleasant Thin Elderly  82 y.o. Caucasian male examined and in no acute distress; cooperative with exam Filed Vitals:   11/05/15 0152  BP: 131/69  Pulse: 77  Temp: 98 F (36.7 C)  TempSrc: Oral  Resp: 16  Height: 6' 2.5" (1.892 m)  Weight: 72.576 kg (160 lb)  SpO2: 96%   Blood pressure 131/69, pulse 77, temperature 98 F (36.7 C), temperature source Oral, resp. rate 16, height 6' 2.5" (1.892 m), weight 72.576 kg (160 lb), SpO2 96 %. PSYCH: He is alert and oriented x4; does not appear anxious does not appear depressed; affect is normal HEENT:  Normocephalic and Atraumatic, Mucous membranes pink; PERRLA; EOM intact; Fundi:  Benign;  No scleral icterus, Nares: Patent, Oropharynx: Clear, Edentulous with Dentures,    Neck:  +Tracheostomy(Plugged at this time)  FROM, No Cervical Lymphadenopathy nor Thyromegaly or Carotid Bruit; No JVD; Breasts:: Not examined CHEST WALL: No tenderness CHEST: Normal respiration, clear to auscultation bilaterally HEART: Regular rate and rhythm; no murmurs rubs or gallops BACK: No kyphosis or scoliosis; No CVA tenderness ABDOMEN: Positive Bowel Sounds, Soft Non-Tender, No Rebound or Guarding; No Masses, No Organomegaly. Rectal Exam: Not  done EXTREMITIES: No Cyanosis, Clubbing, or Edema; No Ulcerations. Genitalia: not examined PULSES: 2+ and symmetric SKIN: Normal hydration no rash or ulceration CNS:  Alert and Oriented x 4, No Focal Deficits Vascular: pulses palpable throughout    Labs on Admission:  Basic Metabolic Panel:  Recent Labs Lab 11/05/15 0220  NA 136  K 4.1  CL 102  CO2 26  GLUCOSE 116*  BUN 34*  CREATININE 0.85  CALCIUM 8.7*   Liver Function Tests:  Recent Labs Lab 11/05/15 0220  AST 26  ALT 17  ALKPHOS 84  BILITOT 0.5  PROT 6.8  ALBUMIN 3.6    Recent Labs Lab 11/05/15 0220  LIPASE 33   No results for input(s): AMMONIA in the last 168 hours. CBC:  Recent Labs Lab 11/05/15 0220  WBC 6.6  NEUTROABS 5.3  HGB 11.1*  HCT 32.3*  MCV 98.8  PLT 223   Cardiac Enzymes: No results for input(s): CKTOTAL, CKMB, CKMBINDEX, TROPONINI in the last 168 hours.  BNP (last 3 results)  Recent Labs  12/23/14 1018 01/11/15 0103  BNP 1166.0* 1853.0*    ProBNP (last 3 results) No results for input(s): PROBNP in the last 8760 hours.  CBG: No results for input(s): GLUCAP in the last 168 hours.  Radiological Exams on Admission: Ct Abdomen Pelvis W Contrast  11/05/2015  CLINICAL DATA:  Acute onset of right lower quadrant abdominal pain. Initial encounter. EXAM: CT  ABDOMEN AND PELVIS WITH CONTRAST TECHNIQUE: Multidetector CT imaging of the abdomen and pelvis was performed using the standard protocol following bolus administration of intravenous contrast. CONTRAST:  112mL OMNIPAQUE IOHEXOL 300 MG/ML  SOLN COMPARISON:  CT of the abdomen and pelvis from 01/11/2015 FINDINGS: The ascending thoracic aorta measures 4.1 cm in AP dimension, within normal limits given the patient's age. Bibasilar atelectasis or scarring is noted. Diffuse coronary artery calcifications are seen. Pacemaker leads are partially imaged. Calcification is noted at the aortic valve. The liver and spleen are unremarkable in appearance. The gallbladder is within normal limits. The pancreas and right adrenal gland are unremarkable. A mildly heterogeneous 2.4 cm left adrenal nodule is noted. The kidneys are unremarkable in appearance. There is no evidence of hydronephrosis. No renal or ureteral stones are seen. Mild nonspecific perinephric stranding is noted bilaterally. There is dilatation of small bowel loops up to 4.0 cm in maximal diameter, with focal transition point at the right hemipelvis, and relatively decompressed distal loops. This is suggestive of partial small-bowel obstruction. A small amount of fluid is still seen within the distal ileum. The stomach is within normal limits. No acute vascular abnormalities are seen. The patient is status post appendectomy. The colon is unremarkable in appearance. The bladder is mildly distended and grossly unremarkable. The prostate is borderline enlarged, measuring 4.9 cm in transverse dimension, with minimal calcification. No inguinal lymphadenopathy is seen. Soft tissue density and minimal calcification adjacent to the left inguinal canal may reflect an undescended left testis. This is grossly unchanged from 2006. No acute osseous abnormalities are identified. There is grade 2 anterolisthesis of L5 on S1, reflecting chronic bilateral pars defects at L5. Multilevel  vacuum phenomenon is noted along the lower thoracic and lumbar spine. IMPRESSION: 1. Dilatation of small bowel loops up to 4.0 cm in maximal diameter, with focal transition point at the right hemipelvis, and relatively decompressed distal loops, though a small amount of fluid is still seen in the distal ileum. This is suggestive of partial small bowel obstruction, possibly due to an adhesion.  2. Diffuse coronary artery calcifications seen. Calcification at the aortic valve. 3. Bibasilar atelectasis or scarring noted. 4. Mildly heterogeneous 2.4 cm left adrenal nodule is relatively stable from 2006 and likely benign. 5. Borderline enlarged prostate. 6. Soft tissue density and minimal calcification adjacent to the left inguinal canal may reflect an undescended left testis. This is grossly unchanged from 2006. 7. Grade 2 anterolisthesis of L5 on S1, reflecting chronic bilateral pars defects at L5. Mild diffuse degenerative change along the lower thoracic and lumbar spine. Electronically Signed   By: Garald Balding M.D.   On: 11/05/2015 03:57     EKG: Independently reviewed.    Assessment/Plan:      82 y.o. male with  Principal Problem:    1.    Partial small bowel obstruction (HCC)    Clear Liquids as tolerated    IV Protonix Rx    Consult Genrral Surgery in AM   Active Problems:    2.    Anemia    Send Anemia Panel    Send FOBT     3.    Aortic valve disorder    Chronic      4.    Chronic systolic congestive heart failure, NYHA class 2 (HCC)    Monitor I/Os    Continue Lasix Rx      5.    Hypothyroidism    Continue Levothyroxine Rx     6.    DVT Prophylaxis     SCDs    Code Status:     FULL CODE       Family Communication:   No Family Present    Disposition Plan:    Inpatient Status        Time spent: [facility] Pager [phone]   If [place] Please Contact the Day Rounding Team MD for [facility]  If 7PM-7AM, Please  Contact Night-Floor Coverage  [email or web address] Password [email or web address] 11/05/2015, 5:10 AM     ADDENDUM:   Patient was seen and examined on 11/05/2015

## 2015-11-05 NOTE — ED Notes (Signed)
Pt c/o abdominal pain; pt denies any n/v/d and states "i just don't feel right"; pt's wife is also at home sick

## 2015-11-05 NOTE — ED Provider Notes (Signed)
CSN: [id number]     Arrival date & time 11/05/15  0147 History   First MD Initiated Contact with Patient 11/05/15 0200   Chief Complaint  Patient presents with  . Abdominal Pain     (Consider location/radiation/quality/duration/timing/severity/associated sxs/prior Treatment) HPI patient reports he started getting right sided abdominal pain this evening after eating supper. He states he has a history of hernia and he's not sure if that's what's causing his pain. He denies nausea or vomiting but states he thinks he would feel better if he would vomit. He denies diarrhea or constipation although he states he did not have a bowel movement today. He denies dysuria or frequency or fever otherwise states he's been getting hot. He states he's never had this pain before. Patient is status post appendectomy.  PCP Dr Willey Blade  Past Medical History  Diagnosis Date  . Arteriosclerotic cardiovascular disease (ASCVD)     Nonobstructive; 09/2008 50% proximal and 40% mid LAD; 25% circumflex; 30% RCA; mild global LV dysfunction with EF of 45%. No aortic stenosis.  . Mild aortic stenosis     not documented at catheterization; verified by echo in 2011  . Peripheral vascular disease ([place])     With a 70% innominate artery stenosis and nonobstructive carotid stenosis  . Hypothyroidism   . Degenerative joint disease     s/p bilateral TKR  . Mobitz (type) II atrioventricular block     With bradycardia; Medtronic pacemaker implanted in 09/2008  . Tobacco abuse, in remission     Remote  . GERD (gastroesophageal reflux disease)   . Hyperlipidemia     Lipid profile in 04/2010:115, 98, 43, 52.  . Weight loss     50 pounds between 1991 and 2011  . 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 Adrenal hyperplasia ([place])     Stable on serial imaging  . Anemia     minimal in 2011 with hemoglobin of 12.2 and high normal MCV  . Borderline hypertension     Normal CMet in 2011  .  Cancer of larynx ([place])     laryngectomy in 1988; postoperative radiation therapy  . Skin cancer    Past Surgical History  Procedure Laterality Date  . Laryngectomy  1988    S/P laryngectomy and radiation therapy  . Appendectomy  1973  . Knee arthroscopy      Left  . Total knee arthroplasty      Bilateral, 19 years ago  . Cataract extraction, bilateral    . Decompression facial nerve      Right median  . Pacemaker insertion    . Insert / replace / remove pacemaker     Family History  Problem Relation Age of Onset  . Stroke Mother   . Leukemia Father   . Colon cancer Neg Hx   . Liver disease Neg Hx   . GI problems Neg Hx   . Stroke Other   . Diabetes Other    Social History  Substance Use Topics  . Smoking status: Former Smoker    Types: Cigarettes  . Smokeless tobacco: Former Systems developer    Quit date: 09/08/1982     Comment: Quit 30 years  . Alcohol Use: No  lives at home Lives with spouse  Review of Systems  All other systems reviewed and are negative.     Allergies  Review of patient's allergies indicates no known allergies.  Home Medications  Prior to Admission medications   Medication Sig Start Date End Date Taking? Authorizing Provider  aspirin EC 81 MG tablet Take 81 mg by mouth daily.    Historical Provider, MD  B Complex-C (B-COMPLEX WITH VITAMIN C) tablet Take 1 tablet by mouth daily.    Historical Provider, MD  docusate sodium (COLACE) 100 MG capsule Take 100 mg by mouth 2 (two) times daily.    Historical Provider, MD  finasteride (PROSCAR) 5 MG tablet Take 5 mg by mouth at bedtime.    Historical Provider, MD  furosemide (LASIX) 40 MG tablet Take 1 tablet (40 mg total) by mouth daily. Patient taking differently: Take 20 mg by mouth daily.  09/05/14   Asencion Noble, MD  levothyroxine (SYNTHROID, LEVOTHROID) 175 MCG tablet Take 175 mcg by mouth every evening.     Historical Provider, MD  magnesium oxide (MAG-OX) 400 MG tablet Take 400 mg by mouth 2 (two) times  daily.    Historical Provider, MD  Melatonin 3 MG TABS Take 2 tablets by mouth daily.    Historical Provider, MD  mirtazapine (REMERON) 15 MG tablet Take 1 tablet by mouth daily. 01/04/15   Historical Provider, MD  omeprazole (PRILOSEC) 20 MG capsule Take 20 mg by mouth daily.    Historical Provider, MD  ropinirole (REQUIP) 5 MG tablet Take 5 mg by mouth at bedtime.    Historical Provider, MD  Tamsulosin HCl (FLOMAX) 0.4 MG CAPS Take 0.4 mg by mouth 2 (two) times daily.     Historical Provider, MD   BP 131/69 mmHg  Pulse 77  Temp(Src) 98 F (36.7 C) (Oral)  Resp 16  Ht 6' 2.5" (1.892 m)  Wt 160 lb (72.576 kg)  BMI 20.27 kg/m2  SpO2 96%  Vital signs normal   Physical Exam  Constitutional: He is oriented to person, place, and time. He appears well-developed and well-nourished.  Non-toxic appearance. He does not appear ill. No distress.  HENT:  Head: Normocephalic and atraumatic.  Right Ear: External ear normal.  Left Ear: External ear normal.  Nose: Nose normal. No mucosal edema or rhinorrhea.  Mouth/Throat: Oropharynx is clear and moist and mucous membranes are normal. No dental abscesses or uvula swelling.  Patient has bilateral hearing aids  Eyes: Conjunctivae and EOM are normal. Pupils are equal, round, and reactive to light.  Neck: Normal range of motion and full passive range of motion without pain. Neck supple.  Patient has a tracheostomy. He uses a voice enhancer to talk.  Cardiovascular: Normal rate and regular rhythm.  Exam reveals no gallop and no friction rub.   No murmur heard. Patient has a harsh systolic murmur consistent with aortic stenosis heard diffusely across his precordium but loudest in the right upper sternal border  Pulmonary/Chest: Effort normal and breath sounds normal. No respiratory distress. He has no wheezes. He has no rhonchi. He has no rales. He exhibits no tenderness and no crepitus.  Abdominal: Soft. Normal appearance and bowel sounds are normal. He  exhibits no distension. There is tenderness. There is no rebound and no guarding.    Patient has diffuse right-sided abdominal tenderness. There is no guarding or rebound. He also has some tenderness in the right inguinal area without obvious mass.  Musculoskeletal: Normal range of motion. He exhibits no edema or tenderness.  Moves all extremities well.   Neurological: He is alert and oriented to person, place, and time. He has normal strength. No cranial nerve deficit.  Skin: Skin is warm, dry  and intact. No rash noted. No erythema. No pallor.  Psychiatric: He has a normal mood and affect. His speech is normal and behavior is normal. His mood appears not anxious.  Nursing note and vitals reviewed.   ED Course  Procedures (including critical care time)  Medications  fentaNYL (SUBLIMAZE) injection 50 mcg (50 mcg Intravenous Given 11/05/15 0231)  ondansetron (ZOFRAN) injection 4 mg (4 mg Intravenous Given 11/05/15 0231)  iohexol (OMNIPAQUE) 300 MG/ML solution 100 mL (100 mLs Intravenous Contrast Given 11/05/15 0334)    Pt was given IV fluids, IV nausea and pain medication. AP CT scan ordered to evaluate his abdominal pain.    Recheck at 0405, pt states his pain is gone. Daughter at bedside, we discussed his CT results and need for admission and they are agreeable. Daughter states he has been having some intermittent abdominal pain and was diagnosed with a hernia at the [facility]. We reviewed his CT scan and there was no hernia described. This was relayed to patient and his daughter.   04:15 Dr Gasper Lloyd, admit to med-surg, Dr Willey Blade attending   Labs Review Results for orders placed or performed during the hospital encounter of 11/05/15  Comprehensive metabolic panel  Result Value Ref Range   Sodium 136 135 - 145 mmol/L   Potassium 4.1 3.5 - 5.1 mmol/L   Chloride 102 101 - 111 mmol/L   CO2 26 22 - 32 mmol/L   Glucose, Bld 116 (H) 65 - 99 mg/dL   BUN 34 (H) 6 - 20 mg/dL   Creatinine, Ser  0.85 0.61 - 1.24 mg/dL   Calcium 8.7 (L) 8.9 - 10.3 mg/dL   Total Protein 6.8 6.5 - 8.1 g/dL   Albumin 3.6 3.5 - 5.0 g/dL   AST 26 15 - 41 U/L   ALT 17 17 - 63 U/L   Alkaline Phosphatase 84 38 - 126 U/L   Total Bilirubin 0.5 0.3 - 1.2 mg/dL   GFR calc non Af Amer >60 >60 mL/min   GFR calc Af Amer >60 >60 mL/min   Anion gap 8 5 - 15  CBC with Differential  Result Value Ref Range   WBC 6.6 4.0 - 10.5 K/uL   RBC 3.27 (L) 4.22 - 5.81 MIL/uL   Hemoglobin 11.1 (L) 13.0 - 17.0 g/dL   HCT 32.3 (L) 39.0 - 52.0 %   MCV 98.8 78.0 - 100.0 fL   MCH 33.9 26.0 - 34.0 pg   MCHC 34.4 30.0 - 36.0 g/dL   RDW 13.6 11.5 - 15.5 %   Platelets 223 150 - 400 K/uL   Neutrophils Relative % 79 %   Neutro Abs 5.3 1.7 - 7.7 K/uL   Lymphocytes Relative 11 %   Lymphs Abs 0.7 0.7 - 4.0 K/uL   Monocytes Relative 7 %   Monocytes Absolute 0.5 0.1 - 1.0 K/uL   Eosinophils Relative 2 %   Eosinophils Absolute 0.1 0.0 - 0.7 K/uL   Basophils Relative 1 %   Basophils Absolute 0.0 0.0 - 0.1 K/uL  Lipase, blood  Result Value Ref Range   Lipase 33 11 - 51 U/L    Laboratory interpretation all normal except mild anemia    Imaging Review Ct Abdomen Pelvis W Contrast  11/05/2015  CLINICAL DATA:  Acute onset of right lower quadrant abdominal pain. Initial encounter. EXAM: CT ABDOMEN AND PELVIS WITH CONTRAST TECHNIQUE: Multidetector CT imaging of the abdomen and pelvis was performed using the standard protocol following bolus administration of intravenous  contrast. CONTRAST:  19mL OMNIPAQUE IOHEXOL 300 MG/ML  SOLN COMPARISON:  CT of the abdomen and pelvis from 01/11/2015 FINDINGS: The ascending thoracic aorta measures 4.1 cm in AP dimension, within normal limits given the patient's age. Bibasilar atelectasis or scarring is noted. Diffuse coronary artery calcifications are seen. Pacemaker leads are partially imaged. Calcification is noted at the aortic valve. The liver and spleen are unremarkable in appearance. The  gallbladder is within normal limits. The pancreas and right adrenal gland are unremarkable. A mildly heterogeneous 2.4 cm left adrenal nodule is noted. The kidneys are unremarkable in appearance. There is no evidence of hydronephrosis. No renal or ureteral stones are seen. Mild nonspecific perinephric stranding is noted bilaterally. There is dilatation of small bowel loops up to 4.0 cm in maximal diameter, with focal transition point at the right hemipelvis, and relatively decompressed distal loops. This is suggestive of partial small-bowel obstruction. A small amount of fluid is still seen within the distal ileum. The stomach is within normal limits. No acute vascular abnormalities are seen. The patient is status post appendectomy. The colon is unremarkable in appearance. The bladder is mildly distended and grossly unremarkable. The prostate is borderline enlarged, measuring 4.9 cm in transverse dimension, with minimal calcification. No inguinal lymphadenopathy is seen. Soft tissue density and minimal calcification adjacent to the left inguinal canal may reflect an undescended left testis. This is grossly unchanged from 2006. No acute osseous abnormalities are identified. There is grade 2 anterolisthesis of L5 on S1, reflecting chronic bilateral pars defects at L5. Multilevel vacuum phenomenon is noted along the lower thoracic and lumbar spine. IMPRESSION: 1. Dilatation of small bowel loops up to 4.0 cm in maximal diameter, with focal transition point at the right hemipelvis, and relatively decompressed distal loops, though a small amount of fluid is still seen in the distal ileum. This is suggestive of partial small bowel obstruction, possibly due to an adhesion. 2. Diffuse coronary artery calcifications seen. Calcification at the aortic valve. 3. Bibasilar atelectasis or scarring noted. 4. Mildly heterogeneous 2.4 cm left adrenal nodule is relatively stable from 2006 and likely benign. 5. Borderline enlarged  prostate. 6. Soft tissue density and minimal calcification adjacent to the left inguinal canal may reflect an undescended left testis. This is grossly unchanged from 2006. 7. Grade 2 anterolisthesis of L5 on S1, reflecting chronic bilateral pars defects at L5. Mild diffuse degenerative change along the lower thoracic and lumbar spine. Electronically Signed   By: Garald Balding M.D.   On: 11/05/2015 03:57   I have personally reviewed and evaluated these images and lab results as part of my medical decision-making.   EKG Interpretation None      MDM   Final diagnoses:  Partial small bowel obstruction [facility])   Plan admission  Rolland Porter, MD, Barbette Or, MD 11/05/15 [phone]

## 2015-11-06 LAB — CBC
HEMATOCRIT: 32.4 % — AB (ref 39.0–52.0)
HEMOGLOBIN: 10.9 g/dL — AB (ref 13.0–17.0)
MCH: 33.7 pg (ref 26.0–34.0)
MCHC: 33.6 g/dL (ref 30.0–36.0)
MCV: 100.3 fL — ABNORMAL HIGH (ref 78.0–100.0)
Platelets: 233 10*3/uL (ref 150–400)
RBC: 3.23 MIL/uL — AB (ref 4.22–5.81)
RDW: 13.8 % (ref 11.5–15.5)
WBC: 5.9 10*3/uL (ref 4.0–10.5)

## 2015-11-06 LAB — BASIC METABOLIC PANEL
ANION GAP: 4 — AB (ref 5–15)
BUN: 16 mg/dL (ref 6–20)
CO2: 29 mmol/L (ref 22–32)
Calcium: 8.2 mg/dL — ABNORMAL LOW (ref 8.9–10.3)
Chloride: 105 mmol/L (ref 101–111)
Creatinine, Ser: 0.65 mg/dL (ref 0.61–1.24)
GFR calc non Af Amer: >60 mL/min (ref >60)
GLUCOSE: 93 mg/dL (ref 65–99)
POTASSIUM: 4.6 mmol/L (ref 3.5–5.1)
Sodium: 138 mmol/L (ref 135–145)

## 2015-11-06 NOTE — Progress Notes (Signed)
Patient refuses to wear Duffy Rhody. He states he wears it only at night at home and covers up his stoma during the day.

## 2015-11-06 NOTE — Progress Notes (Signed)
Subjective: He states his pain is better. He denies nausea or vomiting. He has passed gas. His last stool was the day before admission. He is tolerating clear liquids without difficulty.  Objective: Vital signs in last 24 hours: Filed Vitals:   11/05/15 2029 11/05/15 2033 11/06/15 0536 11/06/15 0640  BP:  83/58 119/49   Pulse: 61 59 59   Temp:  97.7 F (36.5 C) 98.2 F (36.8 C)   TempSrc:  Oral Oral   Resp: 17 20 16    Height:      Weight:      SpO2: 93% 97% 94% 97%   Weight change:   Intake/Output Summary (Last 24 hours) at 11/06/15 0718 Last data filed at 11/06/15 0543  Gross per 24 hour  Intake 3258.75 ml  Output   2350 ml  Net 908.75 ml    Physical Exam: Alert. No distress. Lungs clear. Heart regular with a grade 2 systolic murmur. Abdomen soft and nondistended. No significant tenderness. No hepatosplenomegaly.  Lab Results:    Results for orders placed or performed during the hospital encounter of 11/05/15 (from the past 24 hour(s))  Urinalysis, Routine w reflex microscopic     Status: Abnormal   Collection Time: 11/05/15  9:25 AM  Result Value Ref Range   Color, Urine YELLOW YELLOW   APPearance CLEAR CLEAR   Specific Gravity, Urine 1.010 1.005 - 1.030   pH 6.0 5.0 - 8.0   Glucose, UA NEGATIVE NEGATIVE mg/dL   Hgb urine dipstick TRACE (A) NEGATIVE   Bilirubin Urine NEGATIVE NEGATIVE   Ketones, ur NEGATIVE NEGATIVE mg/dL   Protein, ur TRACE (A) NEGATIVE mg/dL   Nitrite NEGATIVE NEGATIVE   Leukocytes, UA NEGATIVE NEGATIVE  Urine microscopic-add on     Status: Abnormal   Collection Time: 11/05/15  9:25 AM  Result Value Ref Range   Squamous Epithelial / LPF NONE SEEN NONE SEEN   WBC, UA 0-5 0 - 5 WBC/hpf   RBC / HPF 0-5 0 - 5 RBC/hpf   Bacteria, UA RARE (A) NONE SEEN  Basic metabolic panel     Status: Abnormal   Collection Time: 11/06/15  5:28 AM  Result Value Ref Range   Sodium 138 135 - 145 mmol/L   Potassium 4.6 3.5 - 5.1 mmol/L   Chloride 105 101 - 111  mmol/L   CO2 29 22 - 32 mmol/L   Glucose, Bld 93 65 - 99 mg/dL   BUN 16 6 - 20 mg/dL   Creatinine, Ser 0.65 0.61 - 1.24 mg/dL   Calcium 8.2 (L) 8.9 - 10.3 mg/dL   GFR calc non Af Amer >60 >60 mL/min   GFR calc Af Amer >60 >60 mL/min   Anion gap 4 (L) 5 - 15  CBC     Status: Abnormal   Collection Time: 11/06/15  5:28 AM  Result Value Ref Range   WBC 5.9 4.0 - 10.5 K/uL   RBC 3.23 (L) 4.22 - 5.81 MIL/uL   Hemoglobin 10.9 (L) 13.0 - 17.0 g/dL   HCT 32.4 (L) 39.0 - 52.0 %   MCV 100.3 (H) 78.0 - 100.0 fL   MCH 33.7 26.0 - 34.0 pg   MCHC 33.6 30.0 - 36.0 g/dL   RDW 13.8 11.5 - 15.5 %   Platelets 233 150 - 400 K/uL     ABGS No results for input(s): PHART, PO2ART, TCO2, HCO3 in the last 72 hours.  Invalid input(s): PCO2 CULTURES No results found for this or any previous  visit (from the past 240 hour(s)). Studies/Results: Ct Abdomen Pelvis W Contrast  11/05/2015  CLINICAL DATA:  Acute onset of right lower quadrant abdominal pain. Initial encounter. EXAM: CT ABDOMEN AND PELVIS WITH CONTRAST TECHNIQUE: Multidetector CT imaging of the abdomen and pelvis was performed using the standard protocol following bolus administration of intravenous contrast. CONTRAST:  177mL OMNIPAQUE IOHEXOL 300 MG/ML  SOLN COMPARISON:  CT of the abdomen and pelvis from 01/11/2015 FINDINGS: The ascending thoracic aorta measures 4.1 cm in AP dimension, within normal limits given the patient's age. Bibasilar atelectasis or scarring is noted. Diffuse coronary artery calcifications are seen. Pacemaker leads are partially imaged. Calcification is noted at the aortic valve. The liver and spleen are unremarkable in appearance. The gallbladder is within normal limits. The pancreas and right adrenal gland are unremarkable. A mildly heterogeneous 2.4 cm left adrenal nodule is noted. The kidneys are unremarkable in appearance. There is no evidence of hydronephrosis. No renal or ureteral stones are seen. Mild nonspecific perinephric  stranding is noted bilaterally. There is dilatation of small bowel loops up to 4.0 cm in maximal diameter, with focal transition point at the right hemipelvis, and relatively decompressed distal loops. This is suggestive of partial small-bowel obstruction. A small amount of fluid is still seen within the distal ileum. The stomach is within normal limits. No acute vascular abnormalities are seen. The patient is status post appendectomy. The colon is unremarkable in appearance. The bladder is mildly distended and grossly unremarkable. The prostate is borderline enlarged, measuring 4.9 cm in transverse dimension, with minimal calcification. No inguinal lymphadenopathy is seen. Soft tissue density and minimal calcification adjacent to the left inguinal canal may reflect an undescended left testis. This is grossly unchanged from 2006. No acute osseous abnormalities are identified. There is grade 2 anterolisthesis of L5 on S1, reflecting chronic bilateral pars defects at L5. Multilevel vacuum phenomenon is noted along the lower thoracic and lumbar spine. IMPRESSION: 1. Dilatation of small bowel loops up to 4.0 cm in maximal diameter, with focal transition point at the right hemipelvis, and relatively decompressed distal loops, though a small amount of fluid is still seen in the distal ileum. This is suggestive of partial small bowel obstruction, possibly due to an adhesion. 2. Diffuse coronary artery calcifications seen. Calcification at the aortic valve. 3. Bibasilar atelectasis or scarring noted. 4. Mildly heterogeneous 2.4 cm left adrenal nodule is relatively stable from 2006 and likely benign. 5. Borderline enlarged prostate. 6. Soft tissue density and minimal calcification adjacent to the left inguinal canal may reflect an undescended left testis. This is grossly unchanged from 2006. 7. Grade 2 anterolisthesis of L5 on S1, reflecting chronic bilateral pars defects at L5. Mild diffuse degenerative change along the  lower thoracic and lumbar spine. Electronically Signed   By: Garald Balding M.D.   On: 11/05/2015 03:57   Micro Results: No results found for this or any previous visit (from the past 240 hour(s)). Studies/Results: Ct Abdomen Pelvis W Contrast  11/05/2015  CLINICAL DATA:  Acute onset of right lower quadrant abdominal pain. Initial encounter. EXAM: CT ABDOMEN AND PELVIS WITH CONTRAST TECHNIQUE: Multidetector CT imaging of the abdomen and pelvis was performed using the standard protocol following bolus administration of intravenous contrast. CONTRAST:  116mL OMNIPAQUE IOHEXOL 300 MG/ML  SOLN COMPARISON:  CT of the abdomen and pelvis from 01/11/2015 FINDINGS: The ascending thoracic aorta measures 4.1 cm in AP dimension, within normal limits given the patient's age. Bibasilar atelectasis or scarring is noted. Diffuse coronary  artery calcifications are seen. Pacemaker leads are partially imaged. Calcification is noted at the aortic valve. The liver and spleen are unremarkable in appearance. The gallbladder is within normal limits. The pancreas and right adrenal gland are unremarkable. A mildly heterogeneous 2.4 cm left adrenal nodule is noted. The kidneys are unremarkable in appearance. There is no evidence of hydronephrosis. No renal or ureteral stones are seen. Mild nonspecific perinephric stranding is noted bilaterally. There is dilatation of small bowel loops up to 4.0 cm in maximal diameter, with focal transition point at the right hemipelvis, and relatively decompressed distal loops. This is suggestive of partial small-bowel obstruction. A small amount of fluid is still seen within the distal ileum. The stomach is within normal limits. No acute vascular abnormalities are seen. The patient is status post appendectomy. The colon is unremarkable in appearance. The bladder is mildly distended and grossly unremarkable. The prostate is borderline enlarged, measuring 4.9 cm in transverse dimension, with minimal  calcification. No inguinal lymphadenopathy is seen. Soft tissue density and minimal calcification adjacent to the left inguinal canal may reflect an undescended left testis. This is grossly unchanged from 2006. No acute osseous abnormalities are identified. There is grade 2 anterolisthesis of L5 on S1, reflecting chronic bilateral pars defects at L5. Multilevel vacuum phenomenon is noted along the lower thoracic and lumbar spine. IMPRESSION: 1. Dilatation of small bowel loops up to 4.0 cm in maximal diameter, with focal transition point at the right hemipelvis, and relatively decompressed distal loops, though a small amount of fluid is still seen in the distal ileum. This is suggestive of partial small bowel obstruction, possibly due to an adhesion. 2. Diffuse coronary artery calcifications seen. Calcification at the aortic valve. 3. Bibasilar atelectasis or scarring noted. 4. Mildly heterogeneous 2.4 cm left adrenal nodule is relatively stable from 2006 and likely benign. 5. Borderline enlarged prostate. 6. Soft tissue density and minimal calcification adjacent to the left inguinal canal may reflect an undescended left testis. This is grossly unchanged from 2006. 7. Grade 2 anterolisthesis of L5 on S1, reflecting chronic bilateral pars defects at L5. Mild diffuse degenerative change along the lower thoracic and lumbar spine. Electronically Signed   By: Garald Balding M.D.   On: 11/05/2015 03:57   Medications:  I have reviewed the patient's current medications Scheduled Meds: . aspirin EC  81 mg Oral Daily  . finasteride  5 mg Oral QHS  . levothyroxine  175 mcg Oral QAC supper  . magnesium hydroxide  30 mL Oral BID  . magnesium oxide  400 mg Oral BID  . tamsulosin  0.4 mg Oral BID   Continuous Infusions: . sodium chloride 75 mL/hr at 11/05/15 2122   PRN Meds:.acetaminophen **OR** acetaminophen, alum & mag hydroxide-simeth, HYDROmorphone (DILAUDID) injection, ondansetron **OR** ondansetron (ZOFRAN) IV,  oxyCODONE   Assessment/Plan: #1. Partial small bowel obstruction. Improving. Advance diet if okay with surgery. Discussed with Dr. Arnoldo Morale yesterday.  White count normal. Electrolytes are normal. #2. Chronic systolic heart failure. Stable. #3. Hypothyroidism. Continue levothyroxine. Principal Problem:   Partial small bowel obstruction ([facility]) Active Problems:   Hypothyroidism   Hyperlipidemia   Aortic valve disorder   Chronic systolic congestive heart failure, NYHA class 2 ([place])   Anemia     LOS: 1 day   Andre Holder 11/06/2015, 7:18 AM

## 2015-11-06 NOTE — Progress Notes (Signed)
Subjective: Patient passing gas.  No nausea.  Tolerating liquids well.  Objective: Vital signs in last 24 hours: Temp:  [97.7 F (36.5 C)-98.2 F (36.8 C)] 98.2 F (36.8 C) (02/28 0536) Pulse Rate:  [59-64] 59 (02/28 0536) Resp:  [16-20] 16 (02/28 0536) BP: (83-119)/(49-61) 119/49 mmHg (02/28 0536) SpO2:  [93 %-97 %] 97 % (02/28 0817) Last BM Date: 11/03/15  Intake/Output from previous day: 02/27 0701 - 02/28 0700 In: 3258.8 [P.O.:2280; I.V.:978.8] Out: 2350 [Urine:2350] Intake/Output this shift:    General appearance: alert, cooperative and no distress GI: soft, non-tender; bowel sounds normal; no masses,  no organomegaly  Lab Results:   Recent Labs  11/05/15 0220 11/06/15 0528  WBC 6.6 5.9  HGB 11.1* 10.9*  HCT 32.3* 32.4*  PLT 223 233   BMET  Recent Labs  11/05/15 0220 11/06/15 0528  NA 136 138  K 4.1 4.6  CL 102 105  CO2 26 29  GLUCOSE 116* 93  BUN 34* 16  CREATININE 0.85 0.65  CALCIUM 8.7* 8.2*   PT/INR No results for input(s): LABPROT, INR in the last 72 hours.  Studies/Results: Ct Abdomen Pelvis W Contrast  11/05/2015  CLINICAL DATA:  Acute onset of right lower quadrant abdominal pain. Initial encounter. EXAM: CT ABDOMEN AND PELVIS WITH CONTRAST TECHNIQUE: Multidetector CT imaging of the abdomen and pelvis was performed using the standard protocol following bolus administration of intravenous contrast. CONTRAST:  135mL OMNIPAQUE IOHEXOL 300 MG/ML  SOLN COMPARISON:  CT of the abdomen and pelvis from 01/11/2015 FINDINGS: The ascending thoracic aorta measures 4.1 cm in AP dimension, within normal limits given the patient's age. Bibasilar atelectasis or scarring is noted. Diffuse coronary artery calcifications are seen. Pacemaker leads are partially imaged. Calcification is noted at the aortic valve. The liver and spleen are unremarkable in appearance. The gallbladder is within normal limits. The pancreas and right adrenal gland are unremarkable. Holder  mildly heterogeneous 2.4 cm left adrenal nodule is noted. The kidneys are unremarkable in appearance. There is no evidence of hydronephrosis. No renal or ureteral stones are seen. Mild nonspecific perinephric stranding is noted bilaterally. There is dilatation of small bowel loops up to 4.0 cm in maximal diameter, with focal transition point at the right hemipelvis, and relatively decompressed distal loops. This is suggestive of partial small-bowel obstruction. Holder small amount of fluid is still seen within the distal ileum. The stomach is within normal limits. No acute vascular abnormalities are seen. The patient is status post appendectomy. The colon is unremarkable in appearance. The bladder is mildly distended and grossly unremarkable. The prostate is borderline enlarged, measuring 4.9 cm in transverse dimension, with minimal calcification. No inguinal lymphadenopathy is seen. Soft tissue density and minimal calcification adjacent to the left inguinal canal may reflect an undescended left testis. This is grossly unchanged from 2006. No acute osseous abnormalities are identified. There is grade 2 anterolisthesis of L5 on S1, reflecting chronic bilateral pars defects at L5. Multilevel vacuum phenomenon is noted along the lower thoracic and lumbar spine. IMPRESSION: 1. Dilatation of small bowel loops up to 4.0 cm in maximal diameter, with focal transition point at the right hemipelvis, and relatively decompressed distal loops, though Holder small amount of fluid is still seen in the distal ileum. This is suggestive of partial small bowel obstruction, possibly due to an adhesion. 2. Diffuse coronary artery calcifications seen. Calcification at the aortic valve. 3. Bibasilar atelectasis or scarring noted. 4. Mildly heterogeneous 2.4 cm left adrenal nodule is relatively stable from  2006 and likely benign. 5. Borderline enlarged prostate. 6. Soft tissue density and minimal calcification adjacent to the left inguinal canal may  reflect an undescended left testis. This is grossly unchanged from 2006. 7. Grade 2 anterolisthesis of L5 on S1, reflecting chronic bilateral pars defects at L5. Mild diffuse degenerative change along the lower thoracic and lumbar spine. Electronically Signed   By: Garald Balding M.D.   On: 11/05/2015 03:57    Anti-infectives: Anti-infectives    None      Assessment/Plan: Imp:  Partial small bowel obstruction, resolving.  No need for surgery.  May advance diet as tolerated.  May discharge home later today if tolerating diet and is still having GI function.   LOS: 1 day    Andre Holder 11/06/2015

## 2015-11-06 NOTE — Care Management Note (Signed)
Case Management Note  Patient Details  Name: Andre Holder MRN: [id number] Date of Birth: 04-Jul-1923  Expected Discharge Date:     2/29/2017             Expected Discharge Plan:  Home/Self Care  In-House Referral:  NA  Discharge planning Services  CM Consult  Post Acute Care Choice:  NA Choice offered to:  NA  DME Arranged:    DME Agency:     HH Arranged:    [place] Agency:     Status of Service:  In process, will continue to follow  Medicare Important Message Given:    Date Medicare IM Given:    Medicare IM give by:    Date Additional Medicare IM Given:    Additional Medicare Important Message give by:     If discussed at [place] of Stay Meetings, dates discussed:    Additional Comments: Update provided to [facility]. Diet being advanced today. Pt making progress, anticipate DC home in 24 hrs.   Sherald Barge, RN 11/06/2015, 1:24 PM

## 2015-11-06 NOTE — Progress Notes (Signed)
Larry tube cleaned, placed in bag on bedside table. Patient coughed up moderate amounts of yellow thick sputum. Cover placed over stoma. RT will continue monitor.

## 2015-11-07 NOTE — Care Management Important Message (Signed)
Important Message  Patient Details  Name: Andre Holder MRN: [id number] Date of Birth: Nov 20, 1922   Medicare Important Message Given:  Yes    Sherald Barge, RN 11/07/2015, 8:02 AM

## 2015-11-07 NOTE — Care Management Note (Signed)
Case Management Note  Patient Details  Name: Andre Holder MRN: [id number] Date of Birth: [birth year]  Expected Discharge Date:     11/07/2015             Expected Discharge Plan:  Home/Self Care  In-House Referral:  NA  Discharge planning Services  CM Consult  Post Acute Care Choice:  NA Choice offered to:  NA  DME Arranged:    DME Agency:     HH Arranged:    [place] Agency:     Status of Service:  In process, will continue to follow  Medicare Important Message Given:  Yes Date Medicare IM Given:    Medicare IM give by:    Date Additional Medicare IM Given:    Additional Medicare Important Message give by:     If discussed at [place] of Stay Meetings, dates discussed:    Additional Comments: Pt discharging home today with self care. Spoke with pt's wife. Per wife pt has no DC needs. Per pt, pt has no DC needs. [facility].   Sherald Barge, RN 11/07/2015, 8:02 AM

## 2015-11-07 NOTE — Progress Notes (Signed)
Patient d/c'd home with wife via private car.  Follow appts arranged for patient with verbal understanding.  All questions/concerns addressed. Pt was d/c'd in stable condition.

## 2015-11-07 NOTE — Discharge Summary (Signed)
Physician Discharge Summary  Andre Holder [id number] DOB: [birth year] DOA: 11/05/2015   Admit date: 11/05/2015 Discharge date: 11/07/2015  Discharge Diagnoses:  Principal Problem:   Partial small bowel obstruction (HCC) Active Problems:   Hypothyroidism   Hyperlipidemia   Aortic valve disorder   Chronic systolic congestive heart failure, NYHA class 2 (HCC)   Anemia    Wt Readings from Last 3 Encounters:  11/05/15 160 lb (72.576 kg)  01/11/15 159 lb 9.6 oz (72.394 kg)  12/23/14 182 lb 8.7 oz (82.8 kg)     Hospital Course:  This patient is a 82 year old male who presented with nausea, vomiting and right lower abdominal pain. He was admitted to the hospitalist. He underwent a CT scan of the abdomen which revealed small bowel dilation felt to be consistent with a partial small bowel obstruction. He was seen in surgical consultation by Dr. Arnoldo Morale. He did not require nasogastric suction. His vomiting resolved. Diet was advanced. He tolerated this well without additional vomiting. His abdomen is soft and nontender now with no distention. Bowels have recently been moving satisfactorily. He has a history of similar recurrences. On the morning of 3/1 he is asymptomatic. Abdomen is soft and nontender on exam. He is much improved and stable for discharge. He will be seen in follow-up in my office in one week.  He is asymptomatic from the standpoint of his chronic systolic heart failure. He'll continue his usual dose of Lasix.  He had a mild anemia on admission with a hemoglobin of 11.1. Iron was 56 with a ferritin of 80. Vitamin B-12 was normal at 980.   Discharge Instructions     Medication List    STOP taking these medications        diphenhydramine-acetaminophen 25-500 MG Tabs tablet  Commonly known as:  TYLENOL PM      TAKE these medications        aspirin EC 81 MG tablet  Take 81 mg by mouth daily.     atorvastatin 10 MG tablet  Commonly known as:  LIPITOR  Take 10 mg  by mouth daily.     B-complex with vitamin C tablet  Take 1 tablet by mouth daily.     CO Q 10 PO  Take by mouth.     finasteride 5 MG tablet  Commonly known as:  PROSCAR  Take 5 mg by mouth at bedtime.     furosemide 20 MG tablet  Commonly known as:  LASIX  Take 10 mg by mouth daily.     HYDROcodone-acetaminophen 5-325 MG tablet  Commonly known as:  NORCO/VICODIN  Take 1 tablet by mouth 3 (three) times daily as needed for moderate pain.     lactose free nutrition Liqd  Take 237 mLs by mouth 2 (two) times daily.     levothyroxine 175 MCG tablet  Commonly known as:  SYNTHROID, LEVOTHROID  Take 175 mcg by mouth every evening.     lidocaine 5 %  Commonly known as:  LIDODERM  Place 1 patch onto the skin daily as needed (pain). Remove & Discard patch within 12 hours or as directed by MD     magnesium oxide 400 MG tablet  Commonly known as:  MAG-OX  Take 400 mg by mouth 2 (two) times daily.     Melatonin 3 MG Tabs  Take 2 tablets by mouth daily.     Resveratrol 250 MG Caps  Take 1 capsule by mouth 2 (two) times daily.  ropinirole 5 MG tablet  Commonly known as:  REQUIP  Take 5 mg by mouth at bedtime.     tamsulosin 0.4 MG Caps capsule  Commonly known as:  FLOMAX  Take 0.4 mg by mouth 2 (two) times daily.     VITAMIN B-12 PO  Take 1 tablet by mouth daily.     VITAMIN C PO  Take 1 tablet by mouth daily.     VITAMIN D PO  Take 1 tablet by mouth daily.         Ayame Rena 11/07/2015

## 2015-11-27 ENCOUNTER — Encounter (INDEPENDENT_AMBULATORY_CARE_PROVIDER_SITE_OTHER): Payer: Self-pay | Admitting: Internal Medicine

## 2015-11-27 ENCOUNTER — Ambulatory Visit (INDEPENDENT_AMBULATORY_CARE_PROVIDER_SITE_OTHER): Payer: Medicare HMO | Admitting: Internal Medicine

## 2015-11-27 ENCOUNTER — Encounter (INDEPENDENT_AMBULATORY_CARE_PROVIDER_SITE_OTHER): Payer: Self-pay

## 2015-11-27 VITALS — BP 104/68 | HR 56 | Temp 97.7°F | Ht 70.0 in | Wt 164.1 lb

## 2015-11-27 DIAGNOSIS — R1031 Right lower quadrant pain: Secondary | ICD-10-CM

## 2015-11-27 DIAGNOSIS — M5440 Lumbago with sciatica, unspecified side: Secondary | ICD-10-CM | POA: Diagnosis not present

## 2015-11-27 NOTE — Patient Instructions (Signed)
CT lumbar spine.

## 2015-11-27 NOTE — Progress Notes (Addendum)
Subjective:    Patient ID: Andre Holder, male    DOB: [birth year], 82 y.o.   MRN: [id number]  HPI Referred by Dr. Willey Blade for RLQ pain. He has had the pain for "a long time". Per records he has had this pain since 2007.   Wife states there is a "knot" to his wife lower abdomen at times.  His wife states he has lost about 20 pounds over the past 2 years. His appetite is not good. He does supplement his diet with Boost.  He has a BM every daily. No melena or BRRB. Walks with a cane. Hx of partial small bowel obstruction in February of this year and was evaluated by Dr. Arnoldo Morale.  Hx of throat cancer in past.  Hx of pacemaker.    11/05/2015 CT abdomen/pelvis: RLQ pain: 1. Dilatation of small bowel loops up to 4.0 cm in maximal diameter, with focal transition point at the right hemipelvis, and relatively decompressed distal loops, though a small amount of fluid is still seen in the distal ileum. This is suggestive of partial small bowel obstruction, possibly due to an adhesion. 2. Diffuse coronary artery calcifications seen. Calcification at the aortic valve. 3. Bibasilar atelectasis or scarring noted. 4. Mildly heterogeneous 2.4 cm left adrenal nodule is relatively stable from 2006 and likely benign. 5. Borderline enlarged prostate. 6. Soft tissue density and minimal calcification adjacent to the left inguinal canal may reflect an undescended left testis. This is grossly unchanged from 2006. 7. Grade 2 anterolisthesis of L5 on S1, reflecting chronic bilateral pars defects at L5. Mild diffuse degenerative change along the lower thoracic and lumbar spine.  04/09/2015 Acute abdomen: Rt lower quadrant pain IMPRESSION: 1. There is no acute cardiopulmonary abnormality. 2. Chronic elevation of the left hemidiaphragm 3. Findings compatible with a partial distal small bowel obstruction. There is moderate colonic stool burden present. There is no evidence of perforation.  06/20/2010  EGD/Colonoscopy: unintentional weight loss:  Elongated redundant capacious but otherwise normal appearing colon, normal rectum.  EGD: Raised cream colored plaques in esophagus, suspicious for candida, state post KOH brushing, ontherwise unremarkable esophagus. Severely J-shaped stomach, query paraesophageal hernia, mottling, mosaic appearance of the gastric mucosa of uncertain significance, status post biopsy, normal D1,D2.    Review of Systems Past Medical History  Diagnosis Date  . Arteriosclerotic cardiovascular disease (ASCVD)     Nonobstructive; 09/2008 50% proximal and 40% mid LAD; 25% circumflex; 30% RCA; mild global LV dysfunction with EF of 45%. No aortic stenosis.  . Mild aortic stenosis     not documented at catheterization; verified by echo in 2011  . Peripheral vascular disease ([place])     With a 70% innominate artery stenosis and nonobstructive carotid stenosis  . Hypothyroidism   . Degenerative joint disease     s/p bilateral TKR  . Mobitz (type) II atrioventricular block     With bradycardia; Medtronic pacemaker implanted in 09/2008  . Tobacco abuse, in remission     Remote  . GERD (gastroesophageal reflux disease)   . Hyperlipidemia     Lipid profile in 04/2010:115, 98, 43, 52.  . Weight loss     50 pounds between 1991 and 2011  . 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 Adrenal hyperplasia ([place])     Stable on serial imaging  . Anemia     minimal in 2011 with hemoglobin of 12.2 and high normal MCV  . Borderline  hypertension     Normal CMet in 2011  . Cancer of larynx ([place])     laryngectomy in 1988; postoperative radiation therapy  . Skin cancer     Past Surgical History  Procedure Laterality Date  . Laryngectomy  1988    S/P laryngectomy and radiation therapy  . Appendectomy  1973  . Knee arthroscopy      Left  . Total knee arthroplasty      Bilateral, 19 years ago  . Cataract extraction, bilateral    .  Decompression facial nerve      Right median  . Pacemaker insertion    . Insert / replace / remove pacemaker      No Known Allergies  Current Outpatient Prescriptions on File Prior to Visit  Medication Sig Dispense Refill  . Ascorbic Acid (VITAMIN C PO) Take 1 tablet by mouth daily.    Marland Kitchen aspirin EC 81 MG tablet Take 81 mg by mouth daily.    Marland Kitchen atorvastatin (LIPITOR) 10 MG tablet Take 10 mg by mouth daily.    . B Complex-C (B-COMPLEX WITH VITAMIN C) tablet Take 1 tablet by mouth daily.    . Cholecalciferol (VITAMIN D PO) Take 1 tablet by mouth daily.    . Coenzyme Q10 (CO Q 10 PO) Take by mouth.    . Cyanocobalamin (VITAMIN B-12 PO) Take 1 tablet by mouth daily.    . finasteride (PROSCAR) 5 MG tablet Take 5 mg by mouth at bedtime.    . furosemide (LASIX) 20 MG tablet Take 10 mg by mouth daily.    Marland Kitchen HYDROcodone-acetaminophen (NORCO/VICODIN) 5-325 MG tablet Take 1 tablet by mouth 3 (three) times daily as needed for moderate pain.     Marland Kitchen lactose free nutrition (BOOST) LIQD Take 237 mLs by mouth 2 (two) times daily.    Marland Kitchen levothyroxine (SYNTHROID, LEVOTHROID) 175 MCG tablet Take 137 mcg by mouth every evening.     . lidocaine (LIDODERM) 5 % Place 1 patch onto the skin daily as needed (pain). Reported on 11/27/2015    . magnesium oxide (MAG-OX) 400 MG tablet Take 400 mg by mouth 2 (two) times daily.    . Melatonin 3 MG TABS Take 2 tablets by mouth daily.    Marland Kitchen Resveratrol 250 MG CAPS Take 1 capsule by mouth 2 (two) times daily.    . ropinirole (REQUIP) 5 MG tablet Take 5 mg by mouth at bedtime.    . Tamsulosin HCl (FLOMAX) 0.4 MG CAPS Take 0.4 mg by mouth 2 (two) times daily.      No current facility-administered medications on file prior to visit.        Objective:   Physical Exam Blood pressure 104/68, pulse 56, temperature 97.7 F (36.5 C), height 5\' 10"  (1.778 m), weight 164 lb 1.6 oz (74.435 kg). Alert and oriented. Skin warm and dry. Oral mucosa is moist.   . Sclera anicteric,  conjunctivae is pink. Thyroid not enlarged. No cervical lymphadenopathy. Lungs clear. Heart regular rate and rhythm.  Abdomen is soft. Bowel sounds are positive. No hepatomegaly. No abdominal masses felt.          Assessment & Plan:  Rt lower quadrant pain, inguinal area. Dr. Laural Golden in with patient. Back pain.  Patient's wife advised to take picture when the bulging in rt lower quadrant occurs. Ct lumbar spine. Unable to do MRI due to [facility]

## 2015-11-29 ENCOUNTER — Telehealth (INDEPENDENT_AMBULATORY_CARE_PROVIDER_SITE_OTHER): Payer: Self-pay | Admitting: Internal Medicine

## 2015-11-29 NOTE — Telephone Encounter (Signed)
Docia Barrier, Mr. Polachek wife called saying she was told by Karna Christmas to pick up pictures from another Provider. She's wondering what to do with the pictures once they have them. She'd like a phone call regarding this.   Pt's ph# [phone] Thank you.

## 2015-11-30 NOTE — Telephone Encounter (Signed)
Tell her to bring them by the office

## 2015-11-30 NOTE — Telephone Encounter (Signed)
Tell her to bring by the office

## 2015-12-04 ENCOUNTER — Ambulatory Visit (HOSPITAL_COMMUNITY): Payer: Medicare HMO

## 2015-12-04 ENCOUNTER — Ambulatory Visit (HOSPITAL_COMMUNITY)
Admission: RE | Admit: 2015-12-04 | Discharge: 2015-12-04 | Disposition: A | Discharge status: Home / Self Care | Payer: Medicare HMO | Source: Ambulatory Visit | Attending: Internal Medicine | Admitting: Internal Medicine

## 2015-12-04 DIAGNOSIS — Z981 Arthrodesis status: Secondary | ICD-10-CM | POA: Insufficient documentation

## 2015-12-04 DIAGNOSIS — M545 Low back pain: Secondary | ICD-10-CM | POA: Diagnosis not present

## 2015-12-04 DIAGNOSIS — I7090 Unspecified atherosclerosis: Secondary | ICD-10-CM | POA: Diagnosis not present

## 2015-12-04 DIAGNOSIS — M5432 Sciatica, left side: Secondary | ICD-10-CM | POA: Insufficient documentation

## 2015-12-04 DIAGNOSIS — M4806 Spinal stenosis, lumbar region: Secondary | ICD-10-CM | POA: Insufficient documentation

## 2015-12-04 DIAGNOSIS — R1031 Right lower quadrant pain: Secondary | ICD-10-CM | POA: Insufficient documentation

## 2015-12-04 DIAGNOSIS — M5440 Lumbago with sciatica, unspecified side: Secondary | ICD-10-CM

## 2015-12-04 NOTE — Telephone Encounter (Signed)
Pt's wife has been made aware to bring pictures to the office. Thank you.

## 2015-12-05 ENCOUNTER — Ambulatory Visit (HOSPITAL_COMMUNITY): Payer: Medicare HMO

## 2015-12-05 ENCOUNTER — Telehealth (INDEPENDENT_AMBULATORY_CARE_PROVIDER_SITE_OTHER): Payer: Self-pay | Admitting: Internal Medicine

## 2015-12-05 DIAGNOSIS — K4090 Unilateral inguinal hernia, without obstruction or gangrene, not specified as recurrent: Secondary | ICD-10-CM

## 2015-12-05 NOTE — Telephone Encounter (Signed)
Referral to Dr. Barry Dienes

## 2015-12-05 NOTE — Telephone Encounter (Signed)
appt sch'd 12/17/15 at 100, MS [name]

## 2015-12-24 ENCOUNTER — Emergency Department (HOSPITAL_COMMUNITY): Payer: Medicare HMO

## 2015-12-24 ENCOUNTER — Emergency Department (HOSPITAL_COMMUNITY)
Admission: EM | Admit: 2015-12-24 | Discharge: 2015-12-24 | Disposition: A | Discharge status: Home / Self Care | Payer: Medicare HMO | Attending: Emergency Medicine | Admitting: Emergency Medicine

## 2015-12-24 ENCOUNTER — Encounter (HOSPITAL_COMMUNITY): Payer: Self-pay | Admitting: *Deleted

## 2015-12-24 DIAGNOSIS — Q998 Other specified chromosome abnormalities: Secondary | ICD-10-CM

## 2015-12-24 DIAGNOSIS — I739 Peripheral vascular disease, unspecified: Secondary | ICD-10-CM | POA: Diagnosis not present

## 2015-12-24 DIAGNOSIS — M79672 Pain in left foot: Secondary | ICD-10-CM | POA: Insufficient documentation

## 2015-12-24 DIAGNOSIS — L905 Scar conditions and fibrosis of skin: Secondary | ICD-10-CM | POA: Diagnosis not present

## 2015-12-24 DIAGNOSIS — M25572 Pain in left ankle and joints of left foot: Secondary | ICD-10-CM | POA: Insufficient documentation

## 2015-12-24 DIAGNOSIS — Z87891 Personal history of nicotine dependence: Secondary | ICD-10-CM | POA: Insufficient documentation

## 2015-12-24 DIAGNOSIS — E039 Hypothyroidism, unspecified: Secondary | ICD-10-CM | POA: Insufficient documentation

## 2015-12-24 DIAGNOSIS — R52 Pain, unspecified: Secondary | ICD-10-CM

## 2015-12-24 DIAGNOSIS — I2510 Atherosclerotic heart disease of native coronary artery without angina pectoris: Secondary | ICD-10-CM | POA: Diagnosis not present

## 2015-12-24 DIAGNOSIS — E785 Hyperlipidemia, unspecified: Secondary | ICD-10-CM | POA: Insufficient documentation

## 2015-12-24 LAB — CBC WITH DIFFERENTIAL/PLATELET
Basophils Absolute: 0 10*3/uL (ref 0.0–0.1)
Basophils Relative: 1 %
EOS ABS: 0.2 10*3/uL (ref 0.0–0.7)
EOS PCT: 4 %
HCT: 32.6 % — ABNORMAL LOW (ref 39.0–52.0)
Hemoglobin: 11.5 g/dL — ABNORMAL LOW (ref 13.0–17.0)
LYMPHS ABS: 0.7 10*3/uL (ref 0.7–4.0)
LYMPHS PCT: 13 %
MCH: 35.4 pg — AB (ref 26.0–34.0)
MCHC: 35.3 g/dL (ref 30.0–36.0)
MCV: 100.3 fL — AB (ref 78.0–100.0)
MONO ABS: 0.5 10*3/uL (ref 0.1–1.0)
MONOS PCT: 9 %
Neutro Abs: 4 10*3/uL (ref 1.7–7.7)
Neutrophils Relative %: 73 %
Platelets: 224 10*3/uL (ref 150–400)
RBC: 3.25 MIL/uL — AB (ref 4.22–5.81)
RDW: 12.9 % (ref 11.5–15.5)
WBC: 5.4 10*3/uL (ref 4.0–10.5)

## 2015-12-24 LAB — COMPREHENSIVE METABOLIC PANEL
ALBUMIN: 3.4 g/dL — AB (ref 3.5–5.0)
ALT: 38 U/L (ref 17–63)
AST: 43 U/L — AB (ref 15–41)
Alkaline Phosphatase: 113 U/L (ref 38–126)
Anion gap: 7 (ref 5–15)
BUN: 22 mg/dL — AB (ref 6–20)
CHLORIDE: 102 mmol/L (ref 101–111)
CO2: 25 mmol/L (ref 22–32)
CREATININE: 0.71 mg/dL (ref 0.61–1.24)
Calcium: 8.7 mg/dL — ABNORMAL LOW (ref 8.9–10.3)
GFR calc Af Amer: >60 mL/min (ref >60)
GLUCOSE: 130 mg/dL — AB (ref 65–99)
POTASSIUM: 4.7 mmol/L (ref 3.5–5.1)
Sodium: 134 mmol/L — ABNORMAL LOW (ref 135–145)
Total Bilirubin: 0.9 mg/dL (ref 0.3–1.2)
Total Protein: 6.9 g/dL (ref 6.5–8.1)

## 2015-12-24 LAB — LACTIC ACID, PLASMA: Lactic Acid, Venous: 1.2 mmol/L (ref 0.5–2.0)

## 2015-12-24 MED ORDER — HYDROCODONE-ACETAMINOPHEN 5-325 MG PO TABS: 2.0000 | ORAL_TABLET | ORAL | Status: DC | PRN

## 2015-12-24 MED ORDER — HYDROCODONE-ACETAMINOPHEN 5-325 MG PO TABS
1.0000 | ORAL_TABLET | Freq: Once | ORAL | Status: AC
  Administered 2015-12-24: 1 via ORAL
  Filled 2015-12-24: qty 1

## 2015-12-24 NOTE — ED Notes (Signed)
Family member asking how much longer. Was informed EDP was working w/ several critical pt & was the only ED provider here at this time. Family going home to bring pt personal items.

## 2015-12-24 NOTE — ED Notes (Signed)
Pt c/o left leg pain x 1 week . 

## 2015-12-24 NOTE — ED Notes (Signed)
Pt states left foot pain for the past week. Denies any injury.

## 2015-12-24 NOTE — Discharge Instructions (Signed)

## 2015-12-24 NOTE — ED Provider Notes (Signed)
Change of shift - assumed care -  [place] report reviewed with family Stable for d/c F/u with PCP  [name], MD 12/24/15 1040

## 2015-12-24 NOTE — ED Notes (Signed)
Respiratory called to suction pt.

## 2015-12-24 NOTE — ED Provider Notes (Signed)
CSN: [id number]     Arrival date & time 12/24/15  0157 History   First MD Initiated Contact with Patient 12/24/15 [phone]   Chief Complaint  Patient presents with  . Leg Pain     (Consider location/radiation/quality/duration/timing/severity/associated sxs/prior Treatment) HPI patient states he was awakened about midnight with pain in his left foot and ankle. He can only describe the pain as "terrible". He states it's not hurting as much now. He states he had pain there once before several months ago but he does not know what was the cause of the pain. He denies any pain in his calf or knee. He states he had some discomfort in his left thigh and he rubbed on it and it got better. He states he thought that was from anxiety. He denies any injury or change in his activity. He denies any history of gout. As I was leaving the room the patient wanted me to check a hernia in his right groin. He states it comes and goes. He states he can't get back to see the doctor about it for another 6 weeks.  Of note he told the triage nurse his foot has been hurting for the past week.   PCP Dr Willey Blade  Past Medical History  Diagnosis Date  . Arteriosclerotic cardiovascular disease (ASCVD)     Nonobstructive; 09/2008 50% proximal and 40% mid LAD; 25% circumflex; 30% RCA; mild global LV dysfunction with EF of 45%. No aortic stenosis.  . Mild aortic stenosis     not documented at catheterization; verified by echo in 2011  . Peripheral vascular disease ([place])     With a 70% innominate artery stenosis and nonobstructive carotid stenosis  . Hypothyroidism   . Degenerative joint disease     s/p bilateral TKR  . Mobitz (type) II atrioventricular block     With bradycardia; Medtronic pacemaker implanted in 09/2008  . Tobacco abuse, in remission     Remote  . GERD (gastroesophageal reflux disease)   . Hyperlipidemia     Lipid profile in 04/2010:115, 98, 43, 52.  . Weight loss     50 pounds between 1991 and 2011  .  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 Adrenal hyperplasia ([place])     Stable on serial imaging  . Anemia     minimal in 2011 with hemoglobin of 12.2 and high normal MCV  . Borderline hypertension     Normal CMet in 2011  . Cancer of larynx ([place])     laryngectomy in 1988; postoperative radiation therapy  . Skin cancer    Past Surgical History  Procedure Laterality Date  . Laryngectomy  1988    S/P laryngectomy and radiation therapy  . Appendectomy  1973  . Knee arthroscopy      Left  . Total knee arthroplasty      Bilateral, 19 years ago  . Cataract extraction, bilateral    . Decompression facial nerve      Right median  . Pacemaker insertion    . Insert / replace / remove pacemaker     Family History  Problem Relation Age of Onset  . Stroke Mother   . Leukemia Father   . Colon cancer Neg Hx   . Liver disease Neg Hx   . GI problems Neg Hx   . Stroke Other   . Diabetes Other    Social History  Substance Use Topics  .  Smoking status: Former Smoker    Types: Cigarettes  . Smokeless tobacco: Former Systems developer    Quit date: 09/08/1982     Comment: Quit 30 years  . Alcohol Use: No  lives at home  Review of Systems  All other systems reviewed and are negative.     Allergies  Review of patient's allergies indicates no known allergies.  Home Medications   Prior to Admission medications   Medication Sig Start Date End Date Taking? Authorizing Provider  Ascorbic Acid (VITAMIN C PO) Take 1 tablet by mouth daily.    Historical Provider, MD  aspirin EC 81 MG tablet Take 81 mg by mouth daily.    Historical Provider, MD  atorvastatin (LIPITOR) 10 MG tablet Take 10 mg by mouth daily.    Historical Provider, MD  B Complex-C (B-COMPLEX WITH VITAMIN C) tablet Take 1 tablet by mouth daily.    Historical Provider, MD  Cholecalciferol (VITAMIN D PO) Take 1 tablet by mouth daily.    Historical Provider, MD  Coenzyme Q10 (CO Q 10 PO) Take by  mouth.    Historical Provider, MD  Cyanocobalamin (VITAMIN B-12 PO) Take 1 tablet by mouth daily.    Historical Provider, MD  finasteride (PROSCAR) 5 MG tablet Take 5 mg by mouth at bedtime.    Historical Provider, MD  furosemide (LASIX) 20 MG tablet Take 10 mg by mouth daily.    Historical Provider, MD  HYDROcodone-acetaminophen (NORCO/VICODIN) 5-325 MG tablet Take 1 tablet by mouth 3 (three) times daily as needed for moderate pain.  10/26/15   Historical Provider, MD  lactose free nutrition (BOOST) LIQD Take 237 mLs by mouth 2 (two) times daily.    Historical Provider, MD  levothyroxine (SYNTHROID, LEVOTHROID) 175 MCG tablet Take 137 mcg by mouth every evening.     Historical Provider, MD  lidocaine (LIDODERM) 5 % Place 1 patch onto the skin daily as needed (pain). Reported on 11/27/2015    Historical Provider, MD  magnesium oxide (MAG-OX) 400 MG tablet Take 400 mg by mouth 2 (two) times daily.    Historical Provider, MD  Melatonin 3 MG TABS Take 2 tablets by mouth daily.    Historical Provider, MD  pantoprazole (PROTONIX) 40 MG tablet Take 40 mg by mouth 2 (two) times daily.    Historical Provider, MD  Resveratrol 250 MG CAPS Take 1 capsule by mouth 2 (two) times daily.    Historical Provider, MD  ropinirole (REQUIP) 5 MG tablet Take 5 mg by mouth at bedtime.    Historical Provider, MD  Tamsulosin HCl (FLOMAX) 0.4 MG CAPS Take 0.4 mg by mouth 2 (two) times daily.     Historical Provider, MD   BP 135/87 mmHg  Pulse 77  Temp(Src) 97.8 F (36.6 C) (Oral)  Resp 16  Ht 6\' 2"  (1.88 m)  Wt 160 lb (72.576 kg)  BMI 20.53 kg/m2  SpO2 94%  Vital signs normal   Physical Exam  Constitutional: He is oriented to person, place, and time. He appears well-developed and well-nourished.  Non-toxic appearance. He does not appear ill. No distress.  HENT:  Head: Normocephalic and atraumatic.  Right Ear: External ear normal.  Left Ear: External ear normal.  Nose: Nose normal. No mucosal edema or  rhinorrhea.  Mouth/Throat: Oropharynx is clear and moist and mucous membranes are normal. No dental abscesses or uvula swelling.  Eyes: Conjunctivae and EOM are normal. Pupils are equal, round, and reactive to light.  Neck: Normal range of motion and  full passive range of motion without pain. Neck supple.  Patient has obvious surgery in his neck from prior laryngectomy. He uses a amplified voice box  Cardiovascular: Normal rate, regular rhythm and normal heart sounds.  Exam reveals no gallop and no friction rub.   No murmur heard. Pulmonary/Chest: Effort normal and breath sounds normal. No respiratory distress. He has no wheezes. He has no rhonchi. He has no rales. He exhibits no tenderness and no crepitus.  Abdominal: Soft. Normal appearance and bowel sounds are normal. He exhibits no distension. There is no tenderness. There is no rebound and no guarding.  Genitourinary:  Patient has a scar in his right inguinal area consistent with prior hernia repair. At this time there is no protruding hernia.  Musculoskeletal: Normal range of motion. He exhibits no edema or tenderness.  Moves all extremities well. She has pain in his left ankle and left foot. He has a temperature change at about the level the ankle with his foot getting cooler compared to the right. He does have a palpable dorsalis pedis pulse though. His capillary refill is a little prolonged.  Neurological: He is alert and oriented to person, place, and time. He has normal strength. No cranial nerve deficit.  Skin: Skin is warm, dry and intact. No rash noted. No erythema. No pallor.  Psychiatric: He has a normal mood and affect. His speech is normal and behavior is normal. His mood appears not anxious.  Nursing note and vitals reviewed.   ED Course  Procedures (including critical care time)  Medications  HYDROcodone-acetaminophen (NORCO/VICODIN) 5-325 MG per tablet 1 tablet (1 tablet Oral Given 12/24/15 0522)   Patient was given a  Norco for his pain.   6:45 AM patient's daughter has returned. Patient was noted to have a recent MRI with sciatica which did not state which side his pain was on. He states it was pain in his left leg just as tonight. However he's denying back pain tonight.  Pt was scheduled to get arterial doppler of his foot.   Dr. Sabra Heck will follow these results.  Review of the [place] shows patient gets hydrocodone 5/325 monthly. He got #60 tablets on November 1 and January 20, #90 tablets on December 9 and February 17. These are prescribed by the [facility] and his care doctor Dr. Willey Blade.   Labs Review Results for orders placed or performed during the hospital encounter of 12/24/15  Comprehensive metabolic panel  Result Value Ref Range   Sodium 134 (L) 135 - 145 mmol/L   Potassium 4.7 3.5 - 5.1 mmol/L   Chloride 102 101 - 111 mmol/L   CO2 25 22 - 32 mmol/L   Glucose, Bld 130 (H) 65 - 99 mg/dL   BUN 22 (H) 6 - 20 mg/dL   Creatinine, Ser 0.71 0.61 - 1.24 mg/dL   Calcium 8.7 (L) 8.9 - 10.3 mg/dL   Total Protein 6.9 6.5 - 8.1 g/dL   Albumin 3.4 (L) 3.5 - 5.0 g/dL   AST 43 (H) 15 - 41 U/L   ALT 38 17 - 63 U/L   Alkaline Phosphatase 113 38 - 126 U/L   Total Bilirubin 0.9 0.3 - 1.2 mg/dL   GFR calc non Af Amer >60 >60 mL/min   GFR calc Af Amer >60 >60 mL/min   Anion gap 7 5 - 15  CBC with Differential  Result Value Ref Range   WBC 5.4 4.0 - 10.5 K/uL   RBC 3.25 (L) 4.22 -  5.81 MIL/uL   Hemoglobin 11.5 (L) 13.0 - 17.0 g/dL   HCT 32.6 (L) 39.0 - 52.0 %   MCV 100.3 (H) 78.0 - 100.0 fL   MCH 35.4 (H) 26.0 - 34.0 pg   MCHC 35.3 30.0 - 36.0 g/dL   RDW 12.9 11.5 - 15.5 %   Platelets 224 150 - 400 K/uL   Neutrophils Relative % 73 %   Neutro Abs 4.0 1.7 - 7.7 K/uL   Lymphocytes Relative 13 %   Lymphs Abs 0.7 0.7 - 4.0 K/uL   Monocytes Relative 9 %   Monocytes Absolute 0.5 0.1 - 1.0 K/uL   Eosinophils Relative 4 %   Eosinophils Absolute 0.2 0.0 - 0.7 K/uL   Basophils Relative 1 %    Basophils Absolute 0.0 0.0 - 0.1 K/uL  Lactic acid, plasma  Result Value Ref Range   Lactic Acid, Venous 1.2 0.5 - 2.0 mmol/L   Laboratory interpretation all normal except mild anemia  Imaging Review Dg Ankle Left Port  12/24/2015  CLINICAL DATA:  82 year old male with left foot and ankle pain for the past week. No known injury. EXAM: PORTABLE LEFT ANKLE - 2 VIEW COMPARISON:  Concurrently obtained radiographs of the left foot FINDINGS: There is no evidence of fracture, dislocation, or joint effusion. There is no evidence of arthropathy or other focal bone abnormality. Soft tissues are unremarkable. Calcifications present along the courses of the posterior tibial, anterior tibial, dorsalis pedis and common plantar arteries. IMPRESSION: Negative. Atherosclerotic vascular calcifications. Electronically Signed   By: Jacqulynn Cadet M.D.   On: 12/24/2015 07:59   Dg Foot Complete Left  12/24/2015  CLINICAL DATA:  82 year old male with a history of left foot and ankle pain for the past week. No known injury. EXAM: LEFT FOOT - COMPLETE 3+ VIEW COMPARISON:  Concurrently obtained radiographs of the left ankle; prior radiographs of the left foot 09/22/2008 FINDINGS: There is no evidence of fracture or dislocation. There is no evidence of arthropathy or other focal bone abnormality. Soft tissues are unremarkable. Atherosclerotic vascular calcifications noted in the posterior tibial, anterior tibial and dorsalis pedis arteries. IMPRESSION: 1. No acute fracture or malalignment. 2. Atherosclerotic vascular calcifications. Electronically Signed   By: Jacqulynn Cadet M.D.   On: 12/24/2015 07:58     Ct Lumbar Spine Wo Contrast  12/05/2015  CLINICAL DATA:  Chronic low back pain with sciatica. No known injury. Initial encounter.  IMPRESSION: Negative for fracture or other acute abnormality. Chronic bilateral L5 pars interarticularis defects with associated 1.5 cm anterolisthesis L5 on S1. Marked bilateral foraminal  narrowing is present this level. The central canal is open. There is autologous fusion across the disc interspace. Mild central canal and mild to moderate bilateral foraminal narrowing L3-4. Moderately severe bilateral foraminal narrowing L4-5. The central canal appears open at this level. Marked convex left scoliosis. Extensive atherosclerotic vascular disease. Electronically Signed   By: Inge Rise M.D.   On: 12/05/2015 08:31  I have personally reviewed and evaluated these images and lab results as part of my medical decision-making.    MDM   Final diagnoses:  Left foot pain   Disposition pending   Rolland Porter, MD, Barbette Or, MD 12/24/15 [phone]

## 2015-12-24 NOTE — Progress Notes (Signed)
Called to ED to assess pt. Pt on ATC but wanted to remove. Pt states he only wears at night. Spo2 92% on RA, Also pt wanted me to clean lary tube.Pt had already removed tube prior to arrival.  Cleaned and given back to pt. Pt then had home dressing he applied to stoma.

## 2016-01-05 ENCOUNTER — Encounter (HOSPITAL_COMMUNITY): Payer: Self-pay | Admitting: *Deleted

## 2016-01-05 ENCOUNTER — Emergency Department (HOSPITAL_COMMUNITY): Payer: Medicare HMO

## 2016-01-05 ENCOUNTER — Emergency Department (HOSPITAL_COMMUNITY)
Admission: EM | Admit: 2016-01-05 | Discharge: 2016-01-05 | Disposition: A | Discharge status: Home / Self Care | Payer: Medicare HMO | Attending: Emergency Medicine | Admitting: Emergency Medicine

## 2016-01-05 DIAGNOSIS — E039 Hypothyroidism, unspecified: Secondary | ICD-10-CM | POA: Insufficient documentation

## 2016-01-05 DIAGNOSIS — Z7982 Long term (current) use of aspirin: Secondary | ICD-10-CM | POA: Diagnosis not present

## 2016-01-05 DIAGNOSIS — J9500 Unspecified tracheostomy complication: Secondary | ICD-10-CM | POA: Diagnosis not present

## 2016-01-05 DIAGNOSIS — R0602 Shortness of breath: Secondary | ICD-10-CM | POA: Diagnosis present

## 2016-01-05 DIAGNOSIS — E785 Hyperlipidemia, unspecified: Secondary | ICD-10-CM | POA: Diagnosis not present

## 2016-01-05 DIAGNOSIS — Z79891 Long term (current) use of opiate analgesic: Secondary | ICD-10-CM | POA: Diagnosis not present

## 2016-01-05 DIAGNOSIS — Z430 Encounter for attention to tracheostomy: Secondary | ICD-10-CM

## 2016-01-05 NOTE — ED Provider Notes (Signed)
CSN: [id number]     Arrival date & time 01/05/16  2007 History  By signing my name below, I, Nicole Kindred, attest that this documentation has been prepared under the direction and in the presence of Virgel Manifold, MD.   Electronically Signed: Nicole Kindred, ED Scribe. 01/05/2016. 10:33 PM   Chief Complaint  Patient presents with  . Shortness of Breath    The history is provided by a relative. No language interpreter was used.   HPI Comments: Andre Holder is a 82 y.o. male with PSHx of laryngectomy who presents to the Emergency Department complaining of shortness of breath, onset earlier today. Daughter reports that pt has stoma in his throat that is clogged with mucous. He lost a piece of his suctioning machine at home and would like for it to be suctioned. She also notes associated cough. No other associated symptoms noted. No worsening or alleviating factors noted. Pt denies any other pertinent symptoms.   Past Medical History  Diagnosis Date  . Arteriosclerotic cardiovascular disease (ASCVD)     Nonobstructive; 09/2008 50% proximal and 40% mid LAD; 25% circumflex; 30% RCA; mild global LV dysfunction with EF of 45%. No aortic stenosis.  . Mild aortic stenosis     not documented at catheterization; verified by echo in 2011  . Peripheral vascular disease ([place])     With a 70% innominate artery stenosis and nonobstructive carotid stenosis  . Hypothyroidism   . Degenerative joint disease     s/p bilateral TKR  . Mobitz (type) II atrioventricular block     With bradycardia; Medtronic pacemaker implanted in 09/2008  . Tobacco abuse, in remission     Remote  . GERD (gastroesophageal reflux disease)   . Hyperlipidemia     Lipid profile in 04/2010:115, 98, 43, 52.  . Weight loss     50 pounds between 1991 and 2011  . 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 Adrenal hyperplasia ([place])     Stable on serial imaging  . Anemia     minimal  in 2011 with hemoglobin of 12.2 and high normal MCV  . Borderline hypertension     Normal CMet in 2011  . Cancer of larynx ([place])     laryngectomy in 1988; postoperative radiation therapy  . Skin cancer    Past Surgical History  Procedure Laterality Date  . Laryngectomy  1988    S/P laryngectomy and radiation therapy  . Appendectomy  1973  . Knee arthroscopy      Left  . Total knee arthroplasty      Bilateral, 19 years ago  . Cataract extraction, bilateral    . Decompression facial nerve      Right median  . Pacemaker insertion    . Insert / replace / remove pacemaker     Family History  Problem Relation Age of Onset  . Stroke Mother   . Leukemia Father   . Colon cancer Neg Hx   . Liver disease Neg Hx   . GI problems Neg Hx   . Stroke Other   . Diabetes Other    Social History  Substance Use Topics  . Smoking status: Former Smoker    Types: Cigarettes  . Smokeless tobacco: Former Systems developer    Quit date: 09/08/1982     Comment: Quit 30 years  . Alcohol Use: No    Review of Systems  Constitutional: Negative for fever.  Respiratory:  Positive for cough and shortness of breath.   All other systems reviewed and are negative.    Allergies  Review of patient's allergies indicates no known allergies.  Home Medications   Prior to Admission medications   Medication Sig Start Date End Date Taking? Authorizing Provider  Ascorbic Acid (VITAMIN C PO) Take 1 tablet by mouth daily.   Yes Historical Provider, MD  aspirin EC 81 MG tablet Take 81 mg by mouth daily.   Yes Historical Provider, MD  atorvastatin (LIPITOR) 10 MG tablet Take 10 mg by mouth daily.   Yes Historical Provider, MD  B Complex-C (B-COMPLEX WITH VITAMIN C) tablet Take 1 tablet by mouth daily.   Yes Historical Provider, MD  Cholecalciferol (VITAMIN D PO) Take 1 tablet by mouth daily.   Yes Historical Provider, MD  Coenzyme Q10 (CO Q 10 PO) Take 1 tablet by mouth daily.    Yes Historical Provider, MD   Cyanocobalamin (VITAMIN B-12 PO) Take 1 tablet by mouth daily.   Yes Historical Provider, MD  finasteride (PROSCAR) 5 MG tablet Take 5 mg by mouth at bedtime.   Yes Historical Provider, MD  furosemide (LASIX) 20 MG tablet Take 10 mg by mouth daily.   Yes Historical Provider, MD  guaifenesin (MUCUS RELIEF) 400 MG TABS tablet Take 400 mg by mouth every 4 (four) hours as needed (for mucus).   Yes Historical Provider, MD  HYDROcodone-acetaminophen (NORCO/VICODIN) 5-325 MG tablet Take 2 tablets by mouth every 4 (four) hours as needed. 12/24/15  Yes [name], MD  lactose free nutrition (BOOST) LIQD Take 237 mLs by mouth 2 (two) times daily.   Yes Historical Provider, MD  levothyroxine (SYNTHROID, LEVOTHROID) 175 MCG tablet Take 137 mcg by mouth every evening.    Yes Historical Provider, MD  lidocaine (LIDODERM) 5 % Place 1 patch onto the skin daily as needed (pain). Reported on 11/27/2015   Yes Historical Provider, MD  magnesium oxide (MAG-OX) 400 MG tablet Take 400 mg by mouth 2 (two) times daily.   Yes Historical Provider, MD  Melatonin 3 MG TABS Take 2 tablets by mouth daily.   Yes Historical Provider, MD  pantoprazole (PROTONIX) 40 MG tablet Take 40 mg by mouth 2 (two) times daily.   Yes Historical Provider, MD  Resveratrol 250 MG CAPS Take 1 capsule by mouth 2 (two) times daily.   Yes Historical Provider, MD  ropinirole (REQUIP) 5 MG tablet Take 5 mg by mouth at bedtime.   Yes Historical Provider, MD  Tamsulosin HCl (FLOMAX) 0.4 MG CAPS Take 0.4 mg by mouth 2 (two) times daily.    Yes Historical Provider, MD   BP 127/61 mmHg  Pulse 73  Temp(Src) 98.7 F (37.1 C) (Tympanic)  Resp 18  Ht 6\' 1"  (1.854 m)  Wt 177 lb (80.287 kg)  BMI 23.36 kg/m2  SpO2 100% Physical Exam  Constitutional: He is oriented to person, place, and time. He appears well-developed and well-nourished.  HENT:  Head: Normocephalic and atraumatic.  Eyes: EOM are normal.  Neck: Normal range of motion.  Tracheostomy  noted. Healthy appearing stoma. No secretions visualized.   Cardiovascular: Normal rate, regular rhythm, normal heart sounds and intact distal pulses.   Pulmonary/Chest: Effort normal and breath sounds normal. No respiratory distress.  Breath sounds clear  Abdominal: Soft. He exhibits no distension. There is no tenderness.  Musculoskeletal: Normal range of motion.  Neurological: He is alert and oriented to person, place, and time.  Skin: Skin is warm and  dry.  Psychiatric: He has a normal mood and affect. Judgment normal.  Nursing note and vitals reviewed.   ED Course  Procedures (including critical care time) DIAGNOSTIC STUDIES: Oxygen Saturation is 100% on RA, normal by my interpretation.    COORDINATION OF CARE: 9:12 PM-Discussed treatment plan which includes EKG 12-lead and CXR with pt at bedside and pt agreed to plan.   Labs Review Labs Reviewed - No data to display  Imaging Review Dg Chest 2 View  01/05/2016  CLINICAL DATA:  Shortness of breath cough. EXAM: CHEST  2 VIEW COMPARISON:  01/11/2015 FINDINGS: There is a left chest wall pacer device with leads in the right atrial appendage and right ventricle. Moderate cardiac enlargement. Aortic atherosclerosis. Decreased lung volumes with asymmetric elevation of the left hemidiaphragm. Mild pleural fluid is identified along the fissures of the right lung. Mild pulmonary edema is identified. IMPRESSION: 1. Suspect mild CHF. 2. Decreased lung volumes Electronically Signed   By: Kerby Moors M.D.   On: 01/05/2016 22:10   I have personally reviewed and evaluated these images as part of my medical decision-making.   EKG Interpretation   Date/Time:  Saturday January 05 2016 20:34:44 EDT Ventricular Rate:  69 PR Interval:  346 QRS Duration: 199 QT Interval:  478 QTC Calculation: 512 R Axis:   151 Text Interpretation:  Ventricular-paced rhythm No further analysis  attempted due to paced rhythm Confirmed by Jeneen Rinks  MD, [place] ([id number]) on   01/06/2016 5:29:46 PM      MDM   Final diagnoses:  Tracheostomy care ([place])    89yM with dyspnea. Concerned that he needs suctioned. On exam he sounds clear and no significant secretions noted.    Virgel Manifold, MD 01/09/16 [phone]

## 2016-01-05 NOTE — ED Notes (Signed)
Pt reports his stoma in his throat has mucous in it and he has lost a pice to his suctioning machine and is requesting to be suctioned.

## 2016-01-05 NOTE — Discharge Instructions (Signed)
Tracheostomy Tube Safety °A tracheostomy tube (commonly known as trach tube) allows a person to breath without using his or her nose or mouth. A trach tube may be needed if: °· A person's airway is blocked by swelling, injury, tumor, foreign body, vocal cord problem, or severe narrowing of the trachea. °· A person needs long-term ventilation. °· A person has excess airway secretions requiring frequent suctioning. °If you have a trach, you must follow certain safety measures to keep yourself safe and free of infection. °SAFETY MEASURES °· Always carry your emergency travel kit with you when you leave the house. Your bag should include: °¨ A portable suction machine. °¨ Suction catheters. °¨ A mucus trap. °¨ A bulb syringe. °¨ Two trach tubes (one the same size and one smaller). °¨ Trach ties. °¨ Heat and moisture exchanger. °¨ Emergency phone numbers. °¨ Sterile water. °¨ 0.9% saline solution. °¨ Sterile gloves or hand sanitizer. °¨ Sterile gauze pads. °· Clean your stoma site as directed to prevent infection. °· Secure the trach tube exactly as directed to keep the tube from moving out of place. °· Suction the trach tube as often as directed and exactly as directed. °· Avoid dust, mold, tobacco smoke, other types of smoke, and fumes from cleaning solutions, such as ammonia or bleach. °· Cover your trach tube when using any kind of spray product or powder. It is important that you do not inhale the mist or powder. °· Do not sterilize plastic tubes or attempt to clean them in boiling water. They are to be used only once. °· Do not store replacement plastic trach tubes in a location in which the temperature is over 118° F (48° C). °· If you have a cuffed trach tube, do not over inflate the cuff. This can injure your trachea. It may also cause the cuff to extend past the end of the tube where it can restrict or block air flow. °· If you cannot remove your trach tube or the smaller tube that fits inside the trach tube  (inner cannula), do not force it. Call your caregiver. °· Use a humidifier at home to keep some moisture in the air and to prevent your airway and lungs from drying out. Clean the humidifier regularly to prevent buildup of mold and mildew. °· Do not put anything in your trach tube that should not be there. °· Keep your stoma and trach tube dry when you bathe or shower. Direct the shower spray at chest level and place a shower shield or protective covering over your trach tube. °· Keep clothing away from the trach tube except for a protective scarf. Clothing may block the trach tube. Avoid crew necks and turtlenecks. Wear v-neck shirts and open collar shirts or blouses. Do not wear clothes that shed fibers or lint. °· If going outside in very cold air, wear a filter to prevent cold air from entering the trach tube. You can also loosely cover the trach tube with a protective scarf, handkerchief, or gauze. This helps to warm the air as you breathe, so that the cold air does not irritate your trachea and lungs. It also helps to keep out dust or dirt on windy days. °· Sickness: °¨ Suction more frequently is you become sick. °¨ Drink enough fluids to keep your urine clear or pale yellow if you have a fever, vomiting, or diarrhea. °¨ If you vomit, cover your trach tube with a towel to keep vomit out of your airway. If you   think vomit may have entered the trach tube, suction right away.  Post CPR instructions and emergency numbers where they can be seen in an emergency. All of your caregivers must know CPR.  If you use a ventilator:  Routinely check the ventilator safety and sound alarms to be sure they work properly.  Be sure the ventilator tubes are properly placed so that they do not pull on the trach tube.  Do not twist or pull on the trach connector more than needed. This may cause discomfort or disconnect the ventilator tubes.  Hold the trach tube in place when hooking up or disconnecting the ventilator or  humidification tubing.  Always use an inner cannula without side openings (non-fenestrated) with the correct connector if the trach tube has one or more side openings (fenestrated trach tube).  Obtain ongoing support as needed to adjust to living with trach tube. SEEK IMMEDIATE MEDICAL CARE IF:   You have difficulty breathing even after suctioning and cleaning.  You have swelling, redness, warmth, drainage, or tenderness around the stoma.  You have a fever or persistent symptoms for more than 2 to 3 days.  You have a fever and your symptoms suddenly get worse.  You have chills or muscle aches.  You have nausea and vomiting.  You feel dizzy or feel faint.  You have difficulty swallowing.  You have unusual sounds coming from the airway or continue to cough after suctioning.  You have chest pain or have difficulty breathing (despite a clean and properly placed tube).  You have bleeding from the stoma.  You have bright red blood in your mucus.  Your tube becomes plugged and you cannot clear it.  Your tube falls out and cannot be reinserted.   This information is not intended to replace advice given to you by your health care provider. Make sure you discuss any questions you have with your health care provider.   Document Released: 05/19/2012 Document Reviewed: 05/19/2012 Elsevier Interactive Patient Education [employer].

## 2016-01-14 ENCOUNTER — Emergency Department (HOSPITAL_COMMUNITY)
Admission: EM | Admit: 2016-01-14 | Discharge: 2016-01-14 | Disposition: A | Discharge status: Home / Self Care | Payer: Medicare HMO | Attending: Emergency Medicine | Admitting: Emergency Medicine

## 2016-01-14 ENCOUNTER — Encounter (HOSPITAL_COMMUNITY): Payer: Self-pay | Admitting: Emergency Medicine

## 2016-01-14 DIAGNOSIS — R609 Edema, unspecified: Secondary | ICD-10-CM | POA: Diagnosis not present

## 2016-01-14 DIAGNOSIS — Z87891 Personal history of nicotine dependence: Secondary | ICD-10-CM | POA: Insufficient documentation

## 2016-01-14 DIAGNOSIS — E785 Hyperlipidemia, unspecified: Secondary | ICD-10-CM | POA: Insufficient documentation

## 2016-01-14 DIAGNOSIS — E039 Hypothyroidism, unspecified: Secondary | ICD-10-CM | POA: Diagnosis not present

## 2016-01-14 DIAGNOSIS — R1909 Other intra-abdominal and pelvic swelling, mass and lump: Secondary | ICD-10-CM | POA: Diagnosis present

## 2016-01-14 LAB — COMPREHENSIVE METABOLIC PANEL
ALBUMIN: 3.3 g/dL — AB (ref 3.5–5.0)
ALT: 24 U/L (ref 17–63)
AST: 29 U/L (ref 15–41)
Alkaline Phosphatase: 96 U/L (ref 38–126)
Anion gap: 8 (ref 5–15)
BUN: 28 mg/dL — AB (ref 6–20)
CHLORIDE: 96 mmol/L — AB (ref 101–111)
CO2: 26 mmol/L (ref 22–32)
Calcium: 8.8 mg/dL — ABNORMAL LOW (ref 8.9–10.3)
Creatinine, Ser: 0.87 mg/dL (ref 0.61–1.24)
GFR calc Af Amer: >60 mL/min (ref >60)
GLUCOSE: 80 mg/dL (ref 65–99)
POTASSIUM: 4.6 mmol/L (ref 3.5–5.1)
SODIUM: 130 mmol/L — AB (ref 135–145)
Total Bilirubin: 0.6 mg/dL (ref 0.3–1.2)
Total Protein: 6.3 g/dL — ABNORMAL LOW (ref 6.5–8.1)

## 2016-01-14 LAB — CBC WITH DIFFERENTIAL/PLATELET
BASOS ABS: 0 10*3/uL (ref 0.0–0.1)
Basophils Relative: 0 %
EOS ABS: 0.2 10*3/uL (ref 0.0–0.7)
Eosinophils Relative: 4 %
HCT: 32.7 % — ABNORMAL LOW (ref 39.0–52.0)
HEMOGLOBIN: 11.2 g/dL — AB (ref 13.0–17.0)
LYMPHS ABS: 0.9 10*3/uL (ref 0.7–4.0)
Lymphocytes Relative: 17 %
MCH: 34.4 pg — AB (ref 26.0–34.0)
MCHC: 34.3 g/dL (ref 30.0–36.0)
MCV: 100.3 fL — ABNORMAL HIGH (ref 78.0–100.0)
Monocytes Absolute: 0.5 10*3/uL (ref 0.1–1.0)
Monocytes Relative: 9 %
NEUTROS PCT: 70 %
Neutro Abs: 3.6 10*3/uL (ref 1.7–7.7)
Platelets: 182 10*3/uL (ref 150–400)
RBC: 3.26 MIL/uL — AB (ref 4.22–5.81)
RDW: 13 % (ref 11.5–15.5)
WBC: 5.2 10*3/uL (ref 4.0–10.5)

## 2016-01-14 LAB — BRAIN NATRIURETIC PEPTIDE: B NATRIURETIC PEPTIDE 5: 2020 pg/mL — AB (ref 0.0–100.0)

## 2016-01-14 NOTE — ED Notes (Signed)
Pt is HOH with bilateral hearing aids and uses vibrating voice sensitizer to speak. Pt shows me his penis and with help of wife reports swelling in penis today. Denies discharge  And says hurts "alittle"

## 2016-01-14 NOTE — ED Provider Notes (Signed)
CSN: [id number]     Arrival date & time 01/14/16  1318 History   First MD Initiated Contact with Patient 01/14/16 1512     Chief Complaint  Patient presents with  . Groin Swelling     (Consider location/radiation/quality/duration/timing/severity/associated sxs/prior Treatment) HPI complains of penis and scrotum swollen since today. Denies pain denies dysuria denies shortness of breath denies chest pain. Patient also reports bilateral leg swelling for several months. No treatment prior to coming here no other associated symptoms. Past Medical History  Diagnosis Date  . Arteriosclerotic cardiovascular disease (ASCVD)     Nonobstructive; 09/2008 50% proximal and 40% mid LAD; 25% circumflex; 30% RCA; mild global LV dysfunction with EF of 45%. No aortic stenosis.  . Mild aortic stenosis     not documented at catheterization; verified by echo in 2011  . Peripheral vascular disease ([place])     With a 70% innominate artery stenosis and nonobstructive carotid stenosis  . Hypothyroidism   . Degenerative joint disease     s/p bilateral TKR  . Mobitz (type) II atrioventricular block     With bradycardia; Medtronic pacemaker implanted in 09/2008  . Tobacco abuse, in remission     Remote  . GERD (gastroesophageal reflux disease)   . Hyperlipidemia     Lipid profile in 04/2010:115, 98, 43, 52.  . Weight loss     50 pounds between 1991 and 2011  . 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 Adrenal hyperplasia ([place])     Stable on serial imaging  . Anemia     minimal in 2011 with hemoglobin of 12.2 and high normal MCV  . Borderline hypertension     Normal CMet in 2011  . Cancer of larynx ([place])     laryngectomy in 1988; postoperative radiation therapy  . Skin cancer    Past Surgical History  Procedure Laterality Date  . Laryngectomy  1988    S/P laryngectomy and radiation therapy  . Appendectomy  1973  . Knee arthroscopy      Left  . Total knee  arthroplasty      Bilateral, 19 years ago  . Cataract extraction, bilateral    . Decompression facial nerve      Right median  . Pacemaker insertion    . Insert / replace / remove pacemaker     Family History  Problem Relation Age of Onset  . Stroke Mother   . Leukemia Father   . Colon cancer Neg Hx   . Liver disease Neg Hx   . GI problems Neg Hx   . Stroke Other   . Diabetes Other    Social History  Substance Use Topics  . Smoking status: Former Smoker    Types: Cigarettes  . Smokeless tobacco: Former Systems developer    Quit date: 09/08/1982     Comment: Quit 30 years  . Alcohol Use: No    Review of Systems  Constitutional: Negative.   HENT: Positive for hearing loss.        Status post laryngectomy  Respiratory: Negative.  Negative for shortness of breath.   Cardiovascular: Positive for leg swelling.  Gastrointestinal: Negative.   Genitourinary: Positive for penile swelling and scrotal swelling.  Musculoskeletal: Negative.   Skin: Negative.   Neurological: Negative.   Psychiatric/Behavioral: Negative.   All other systems reviewed and are negative.     Allergies  Review of patient's allergies indicates no known allergies.  Home Medications   Prior to Admission medications   Medication Sig Start Date End Date Taking? Authorizing Provider  Ascorbic Acid (VITAMIN C PO) Take 1 tablet by mouth daily.    Historical Provider, MD  aspirin EC 81 MG tablet Take 81 mg by mouth daily.    Historical Provider, MD  atorvastatin (LIPITOR) 10 MG tablet Take 10 mg by mouth daily.    Historical Provider, MD  B Complex-C (B-COMPLEX WITH VITAMIN C) tablet Take 1 tablet by mouth daily.    Historical Provider, MD  Cholecalciferol (VITAMIN D PO) Take 1 tablet by mouth daily.    Historical Provider, MD  Coenzyme Q10 (CO Q 10 PO) Take 1 tablet by mouth daily.     Historical Provider, MD  Cyanocobalamin (VITAMIN B-12 PO) Take 1 tablet by mouth daily.    Historical Provider, MD  finasteride  (PROSCAR) 5 MG tablet Take 5 mg by mouth at bedtime.    Historical Provider, MD  furosemide (LASIX) 20 MG tablet Take 10 mg by mouth daily.    Historical Provider, MD  guaifenesin (MUCUS RELIEF) 400 MG TABS tablet Take 400 mg by mouth every 4 (four) hours as needed (for mucus).    Historical Provider, MD  HYDROcodone-acetaminophen (NORCO/VICODIN) 5-325 MG tablet Take 2 tablets by mouth every 4 (four) hours as needed. 12/24/15   [name], MD  lactose free nutrition (BOOST) LIQD Take 237 mLs by mouth 2 (two) times daily.    Historical Provider, MD  levothyroxine (SYNTHROID, LEVOTHROID) 175 MCG tablet Take 137 mcg by mouth every evening.     Historical Provider, MD  lidocaine (LIDODERM) 5 % Place 1 patch onto the skin daily as needed (pain). Reported on 11/27/2015    Historical Provider, MD  magnesium oxide (MAG-OX) 400 MG tablet Take 400 mg by mouth 2 (two) times daily.    Historical Provider, MD  Melatonin 3 MG TABS Take 2 tablets by mouth daily.    Historical Provider, MD  pantoprazole (PROTONIX) 40 MG tablet Take 40 mg by mouth 2 (two) times daily.    Historical Provider, MD  Resveratrol 250 MG CAPS Take 1 capsule by mouth 2 (two) times daily.    Historical Provider, MD  ropinirole (REQUIP) 5 MG tablet Take 5 mg by mouth at bedtime.    Historical Provider, MD  Tamsulosin HCl (FLOMAX) 0.4 MG CAPS Take 0.4 mg by mouth 2 (two) times daily.     Historical Provider, MD   BP 111/60 mmHg  Pulse 65  Temp(Src) 97.8 F (36.6 C) (Oral)  Resp 18  Ht 6\' 1"  (1.854 m)  Wt 177 lb (80.287 kg)  BMI 23.36 kg/m2  SpO2 99% Physical Exam  Constitutional: He appears well-developed.  Frail-appearing  HENT:  Head: Normocephalic and atraumatic.  Eyes: Conjunctivae are normal. Pupils are equal, round, and reactive to light.  Neck: Neck supple. No tracheal deviation present. No thyromegaly present.  Cardiovascular: Normal rate and regular rhythm.   No murmur heard. Pulmonary/Chest: Effort normal and breath  sounds normal.  Abdominal: Soft. Bowel sounds are normal. He exhibits no distension. There is no tenderness.  Genitourinary: No penile tenderness.  Shaft of penis mildly swollen. Scrotal mildly swollen.  Musculoskeletal: Normal range of motion. He exhibits edema. He exhibits no tenderness.  Trace pretibial pitting edema bilaterally  Neurological: He is alert. Coordination normal.  Skin: Skin is warm and dry. No rash noted.  Psychiatric: He has a normal mood and affect.  Nursing note and vitals reviewed.  ED Course  Procedures (including critical care time) Labs Review Labs Reviewed  CBC WITH DIFFERENTIAL/PLATELET - Abnormal; Notable for the following:    RBC 3.26 (*)    Hemoglobin 11.2 (*)    HCT 32.7 (*)    MCV 100.3 (*)    MCH 34.4 (*)    All other components within normal limits  COMPREHENSIVE METABOLIC PANEL - Abnormal; Notable for the following:    Sodium 130 (*)    Chloride 96 (*)    BUN 28 (*)    Calcium 8.8 (*)    Total Protein 6.3 (*)    Albumin 3.3 (*)    All other components within normal limits  BRAIN NATRIURETIC PEPTIDE - Abnormal; Notable for the following:    B Natriuretic Peptide 2020.0 (*)    All other components within normal limits    Imaging Review No results found. I have personally reviewed and evaluated these images and lab results as part of my medical decision-making.   EKG Interpretation None     Results for orders placed or performed during the hospital encounter of 01/14/16  CBC with Differential  Result Value Ref Range   WBC 5.2 4.0 - 10.5 K/uL   RBC 3.26 (L) 4.22 - 5.81 MIL/uL   Hemoglobin 11.2 (L) 13.0 - 17.0 g/dL   HCT 32.7 (L) 39.0 - 52.0 %   MCV 100.3 (H) 78.0 - 100.0 fL   MCH 34.4 (H) 26.0 - 34.0 pg   MCHC 34.3 30.0 - 36.0 g/dL   RDW 13.0 11.5 - 15.5 %   Platelets 182 150 - 400 K/uL   Neutrophils Relative % 70 %   Neutro Abs 3.6 1.7 - 7.7 K/uL   Lymphocytes Relative 17 %   Lymphs Abs 0.9 0.7 - 4.0 K/uL   Monocytes  Relative 9 %   Monocytes Absolute 0.5 0.1 - 1.0 K/uL   Eosinophils Relative 4 %   Eosinophils Absolute 0.2 0.0 - 0.7 K/uL   Basophils Relative 0 %   Basophils Absolute 0.0 0.0 - 0.1 K/uL  Comprehensive metabolic panel  Result Value Ref Range   Sodium 130 (L) 135 - 145 mmol/L   Potassium 4.6 3.5 - 5.1 mmol/L   Chloride 96 (L) 101 - 111 mmol/L   CO2 26 22 - 32 mmol/L   Glucose, Bld 80 65 - 99 mg/dL   BUN 28 (H) 6 - 20 mg/dL   Creatinine, Ser 0.87 0.61 - 1.24 mg/dL   Calcium 8.8 (L) 8.9 - 10.3 mg/dL   Total Protein 6.3 (L) 6.5 - 8.1 g/dL   Albumin 3.3 (L) 3.5 - 5.0 g/dL   AST 29 15 - 41 U/L   ALT 24 17 - 63 U/L   Alkaline Phosphatase 96 38 - 126 U/L   Total Bilirubin 0.6 0.3 - 1.2 mg/dL   GFR calc non Af Amer >60 >60 mL/min   GFR calc Af Amer >60 >60 mL/min   Anion gap 8 5 - 15  Brain natriuretic peptide  Result Value Ref Range   B Natriuretic Peptide 2020.0 (H) 0.0 - 100.0 pg/mL   Dg Chest 2 View  01/05/2016  CLINICAL DATA:  Shortness of breath cough. EXAM: CHEST  2 VIEW COMPARISON:  01/11/2015 FINDINGS: There is a left chest wall pacer device with leads in the right atrial appendage and right ventricle. Moderate cardiac enlargement. Aortic atherosclerosis. Decreased lung volumes with asymmetric elevation of the left hemidiaphragm. Mild pleural fluid is identified along the fissures of the  right lung. Mild pulmonary edema is identified. IMPRESSION: 1. Suspect mild CHF. 2. Decreased lung volumes Electronically Signed   By: Kerby Moors M.D.   On: 01/05/2016 22:10   US Arterial Seg Single  12/24/2015  CLINICAL DATA:  Left heel pain, cold x2 days. EXAM: NONINVASIVE PHYSIOLOGIC VASCULAR STUDY OF BILATERAL LOWER EXTREMITIES TECHNIQUE: Evaluation of both lower extremities were performed at rest, including calculation of ankle-brachial indices with single level Doppler, pressure and pulse volume recording. COMPARISON:  05/31/2015 FINDINGS: Right ABI:  Non calculable due to vascular  noncompressibility Left ABI:  Artificially elevated due to vascular noncompressibility Right Lower Extremity:  Triphasic waveforms distally. Left Lower Extremity:  Triphasic waveforms distally. IMPRESSION: No convincing evidence of hemodynamically significant lower extremity arterial occlusive disease at rest. Vascular noncompressibility, probably from medial calcification, results in inaccurate/noncalculable ABIs. Electronically Signed   By: Lucrezia Europe M.D.   On: 12/24/2015 09:50   Dg Ankle Left Port  12/24/2015  CLINICAL DATA:  82 year old male with left foot and ankle pain for the past week. No known injury. EXAM: PORTABLE LEFT ANKLE - 2 VIEW COMPARISON:  Concurrently obtained radiographs of the left foot FINDINGS: There is no evidence of fracture, dislocation, or joint effusion. There is no evidence of arthropathy or other focal bone abnormality. Soft tissues are unremarkable. Calcifications present along the courses of the posterior tibial, anterior tibial, dorsalis pedis and common plantar arteries. IMPRESSION: Negative. Atherosclerotic vascular calcifications. Electronically Signed   By: Jacqulynn Cadet M.D.   On: 12/24/2015 07:59   Dg Foot Complete Left  12/24/2015  CLINICAL DATA:  82 year old male with a history of left foot and ankle pain for the past week. No known injury. EXAM: LEFT FOOT - COMPLETE 3+ VIEW COMPARISON:  Concurrently obtained radiographs of the left ankle; prior radiographs of the left foot 09/22/2008 FINDINGS: There is no evidence of fracture or dislocation. There is no evidence of arthropathy or other focal bone abnormality. Soft tissues are unremarkable. Atherosclerotic vascular calcifications noted in the posterior tibial, anterior tibial and dorsalis pedis arteries. IMPRESSION: 1. No acute fracture or malalignment. 2. Atherosclerotic vascular calcifications. Electronically Signed   By: Jacqulynn Cadet M.D.   On: 12/24/2015 07:58    MDM  Examine complaint is consistent  with peripheral edema. He denies any shortness of breath. Lungs clear to auscultation. He is lying flat without difficulty. Normal pulse oximetry normal respiratory rate Final diagnoses:  None  Plan increase Lasix from 10 mg daily to 20 mg daily. Follow-up with Dr. Willey Blade next week. Anemia is chronic Diagnosis #1peripheral edema #2 anemia     Orlie Dakin, MD 01/14/16 1544

## 2016-01-14 NOTE — ED Notes (Signed)
PT c/o testicular swelling that started this morning with discomfort. PT denies any SOB or urinary retention.

## 2016-01-14 NOTE — Discharge Instructions (Signed)
Peripheral Edema Increase Lasix to 20 mg daily. Make an appointment with Dr.Fagan to arrange to be seen within the next 1 or 2 weeks. Be careful when standing, as increasing Lasix might be cause you become lightheaded. You have swelling in your legs (peripheral edema). This swelling is due to excess accumulation of salt and water in your body. Edema may be a sign of heart, kidney or liver disease, or a side effect of a medication. It may also be due to problems in the leg veins. Elevating your legs and using special support stockings may be very helpful, if the cause of the swelling is due to poor venous circulation. Avoid long periods of standing, whatever the cause. Treatment of edema depends on identifying the cause. Chips, pretzels, pickles and other salty foods should be avoided. Restricting salt in your diet is almost always needed. Water pills (diuretics) are often used to remove the excess salt and water from your body via urine. These medicines prevent the kidney from reabsorbing sodium. This increases urine flow. Diuretic treatment may also result in lowering of potassium levels in your body. Potassium supplements may be needed if you have to use diuretics daily. Daily weights can help you keep track of your progress in clearing your edema. You should call your caregiver for follow up care as recommended. SEEK IMMEDIATE MEDICAL CARE IF:   You have increased swelling, pain, redness, or heat in your legs.  You develop shortness of breath, especially when lying down.  You develop chest or abdominal pain, weakness, or fainting.  You have a fever.   This information is not intended to replace advice given to you by your health care provider. Make sure you discuss any questions you have with your health care provider.   Document Released: 10/02/2004 Document Revised: 11/17/2011 Document Reviewed: 03/07/2015 Elsevier Interactive Patient Education [employer].

## 2016-01-17 ENCOUNTER — Emergency Department (HOSPITAL_COMMUNITY): Payer: Medicare HMO

## 2016-01-17 ENCOUNTER — Encounter (HOSPITAL_COMMUNITY): Payer: Self-pay | Admitting: *Deleted

## 2016-01-17 ENCOUNTER — Inpatient Hospital Stay (HOSPITAL_COMMUNITY)
Admission: EM | Admit: 2016-01-17 | Discharge: 2016-01-23 | DRG: 292 | Disposition: A | Discharge status: Home Health | Payer: Medicare HMO | Attending: Internal Medicine | Admitting: Internal Medicine

## 2016-01-17 DIAGNOSIS — Z85819 Personal history of malignant neoplasm of unspecified site of lip, oral cavity, and pharynx: Secondary | ICD-10-CM

## 2016-01-17 DIAGNOSIS — I739 Peripheral vascular disease, unspecified: Secondary | ICD-10-CM | POA: Diagnosis present

## 2016-01-17 DIAGNOSIS — I5043 Acute on chronic combined systolic (congestive) and diastolic (congestive) heart failure: Principal | ICD-10-CM | POA: Diagnosis present

## 2016-01-17 DIAGNOSIS — Z930 Tracheostomy status: Secondary | ICD-10-CM

## 2016-01-17 DIAGNOSIS — I5023 Acute on chronic systolic (congestive) heart failure: Secondary | ICD-10-CM

## 2016-01-17 DIAGNOSIS — I2510 Atherosclerotic heart disease of native coronary artery without angina pectoris: Secondary | ICD-10-CM | POA: Diagnosis present

## 2016-01-17 DIAGNOSIS — Z8521 Personal history of malignant neoplasm of larynx: Secondary | ICD-10-CM

## 2016-01-17 DIAGNOSIS — R609 Edema, unspecified: Secondary | ICD-10-CM | POA: Diagnosis not present

## 2016-01-17 DIAGNOSIS — I509 Heart failure, unspecified: Secondary | ICD-10-CM

## 2016-01-17 DIAGNOSIS — Z7982 Long term (current) use of aspirin: Secondary | ICD-10-CM

## 2016-01-17 DIAGNOSIS — E039 Hypothyroidism, unspecified: Secondary | ICD-10-CM | POA: Diagnosis present

## 2016-01-17 DIAGNOSIS — E785 Hyperlipidemia, unspecified: Secondary | ICD-10-CM | POA: Diagnosis present

## 2016-01-17 DIAGNOSIS — Z833 Family history of diabetes mellitus: Secondary | ICD-10-CM

## 2016-01-17 DIAGNOSIS — R600 Localized edema: Secondary | ICD-10-CM

## 2016-01-17 DIAGNOSIS — Z85828 Personal history of other malignant neoplasm of skin: Secondary | ICD-10-CM

## 2016-01-17 DIAGNOSIS — E871 Hypo-osmolality and hyponatremia: Secondary | ICD-10-CM | POA: Diagnosis present

## 2016-01-17 DIAGNOSIS — R0602 Shortness of breath: Secondary | ICD-10-CM | POA: Diagnosis present

## 2016-01-17 DIAGNOSIS — Z823 Family history of stroke: Secondary | ICD-10-CM

## 2016-01-17 DIAGNOSIS — E876 Hypokalemia: Secondary | ICD-10-CM | POA: Diagnosis present

## 2016-01-17 DIAGNOSIS — E46 Unspecified protein-calorie malnutrition: Secondary | ICD-10-CM | POA: Diagnosis present

## 2016-01-17 DIAGNOSIS — I350 Nonrheumatic aortic (valve) stenosis: Secondary | ICD-10-CM | POA: Diagnosis present

## 2016-01-17 DIAGNOSIS — Z950 Presence of cardiac pacemaker: Secondary | ICD-10-CM

## 2016-01-17 DIAGNOSIS — Z8673 Personal history of transient ischemic attack (TIA), and cerebral infarction without residual deficits: Secondary | ICD-10-CM

## 2016-01-17 DIAGNOSIS — Z806 Family history of leukemia: Secondary | ICD-10-CM

## 2016-01-17 DIAGNOSIS — Z87891 Personal history of nicotine dependence: Secondary | ICD-10-CM

## 2016-01-17 DIAGNOSIS — Z96653 Presence of artificial knee joint, bilateral: Secondary | ICD-10-CM | POA: Diagnosis present

## 2016-01-17 DIAGNOSIS — D649 Anemia, unspecified: Secondary | ICD-10-CM | POA: Diagnosis present

## 2016-01-17 DIAGNOSIS — K219 Gastro-esophageal reflux disease without esophagitis: Secondary | ICD-10-CM | POA: Diagnosis present

## 2016-01-17 HISTORY — DX: Nonrheumatic mitral (valve) insufficiency: I34.0

## 2016-01-17 HISTORY — DX: Occlusion and stenosis of unspecified carotid artery: I65.29

## 2016-01-17 LAB — CBC WITH DIFFERENTIAL/PLATELET
Basophils Absolute: 0 10*3/uL (ref 0.0–0.1)
Basophils Relative: 1 %
EOS ABS: 0.2 10*3/uL (ref 0.0–0.7)
Eosinophils Relative: 4 %
HEMATOCRIT: 33.4 % — AB (ref 39.0–52.0)
HEMOGLOBIN: 11.5 g/dL — AB (ref 13.0–17.0)
LYMPHS ABS: 0.8 10*3/uL (ref 0.7–4.0)
LYMPHS PCT: 15 %
MCH: 34.1 pg — AB (ref 26.0–34.0)
MCHC: 34.4 g/dL (ref 30.0–36.0)
MCV: 99.1 fL (ref 78.0–100.0)
MONOS PCT: 9 %
Monocytes Absolute: 0.5 10*3/uL (ref 0.1–1.0)
NEUTROS ABS: 3.9 10*3/uL (ref 1.7–7.7)
NEUTROS PCT: 71 %
Platelets: 184 10*3/uL (ref 150–400)
RBC: 3.37 MIL/uL — AB (ref 4.22–5.81)
RDW: 13 % (ref 11.5–15.5)
WBC: 5.4 10*3/uL (ref 4.0–10.5)

## 2016-01-17 LAB — COMPREHENSIVE METABOLIC PANEL
ALK PHOS: 99 U/L (ref 38–126)
ALT: 23 U/L (ref 17–63)
AST: 28 U/L (ref 15–41)
Albumin: 3.3 g/dL — ABNORMAL LOW (ref 3.5–5.0)
Anion gap: 8 (ref 5–15)
BILIRUBIN TOTAL: 0.6 mg/dL (ref 0.3–1.2)
BUN: 32 mg/dL — ABNORMAL HIGH (ref 6–20)
CALCIUM: 8.1 mg/dL — AB (ref 8.9–10.3)
CO2: 26 mmol/L (ref 22–32)
CREATININE: 1 mg/dL (ref 0.61–1.24)
Chloride: 97 mmol/L — ABNORMAL LOW (ref 101–111)
GFR calc non Af Amer: >60 mL/min (ref >60)
Glucose, Bld: 98 mg/dL (ref 65–99)
Potassium: 4 mmol/L (ref 3.5–5.1)
Sodium: 131 mmol/L — ABNORMAL LOW (ref 135–145)
TOTAL PROTEIN: 6.2 g/dL — AB (ref 6.5–8.1)

## 2016-01-17 LAB — BRAIN NATRIURETIC PEPTIDE: B Natriuretic Peptide: 1710 pg/mL — ABNORMAL HIGH (ref 0.0–100.0)

## 2016-01-17 MED ORDER — SODIUM CHLORIDE 0.9 % IV SOLN: Freq: Once | INTRAVENOUS | Status: DC

## 2016-01-17 NOTE — ED Notes (Signed)
Pt c/o swelling to bilateral lower legs and groin area that started x 1 day ago

## 2016-01-17 NOTE — ED Provider Notes (Signed)
CSN: [id number]     Arrival date & time 01/17/16  2123 History  By signing my name below, I, Andre Holder, attest that this documentation has been prepared under the direction and in the presence of Andre Meadow, PA-C. Electronically Signed: Julien Holder, ED Scribe. 01/17/2016. 10:59 PM.    Chief Complaint  Patient presents with  . Leg Swelling      The history is provided by the patient. No language interpreter was used.   HPI Comments: Andre Holder is a 82 y.o. male who has a PMHx of ASCVD, adrenal hyperplasia, hyperthyroidism, larynx cancer presents to the Emergency Department complaining of sudden onset, gradual worsening, moderate, bilateral lower leg swelling onset one day ago. He has been having associated shortness of breath. Pt was seen three days ago for testicular swelling that has also gotten gradually worse. He was seen by his PCP and was told if his swelling didn't go down by today, then come to the ED to be evaluated. He denies abdominal pain. Daughter reports pt was here 3 days ago for the same.  She is concerned that he is more short of breath.    Past Medical History  Diagnosis Date  . Arteriosclerotic cardiovascular disease (ASCVD)     Nonobstructive; 09/2008 50% proximal and 40% mid LAD; 25% circumflex; 30% RCA; mild global LV dysfunction with EF of 45%. No aortic stenosis.  . Mild aortic stenosis     not documented at catheterization; verified by echo in 2011  . Peripheral vascular disease ([place])     With a 70% innominate artery stenosis and nonobstructive carotid stenosis  . Hypothyroidism   . Degenerative joint disease     s/p bilateral TKR  . Mobitz (type) II atrioventricular block     With bradycardia; Medtronic pacemaker implanted in 09/2008  . Tobacco abuse, in remission     Remote  . GERD (gastroesophageal reflux disease)   . Hyperlipidemia     Lipid profile in 04/2010:115, 98, 43, 52.  . Weight loss     50 pounds between 1991 and 2011  . 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 Adrenal hyperplasia (Andre Holder)     Stable on serial imaging  . Anemia     minimal in 2011 with hemoglobin of 12.2 and high normal MCV  . Borderline hypertension     Normal CMet in 2011  . Cancer of larynx (Andre Holder)     laryngectomy in 1988; postoperative radiation therapy  . Skin cancer    Past Surgical History  Procedure Laterality Date  . Laryngectomy  1988    S/P laryngectomy and radiation therapy  . Appendectomy  1973  . Knee arthroscopy      Left  . Total knee arthroplasty      Bilateral, 19 years ago  . Cataract extraction, bilateral    . Decompression facial nerve      Right median  . Pacemaker insertion    . Insert / replace / remove pacemaker     Family History  Problem Relation Age of Onset  . Stroke Mother   . Leukemia Father   . Colon cancer Neg Hx   . Liver disease Neg Hx   . GI problems Neg Hx   . Stroke Other   . Diabetes Other    Social History  Substance Use Topics  . Smoking status: Former Smoker    Types: Cigarettes  . Smokeless  tobacco: Former Systems developer    Quit date: 09/08/1982     Comment: Quit 30 years  . Alcohol Use: No    Review of Systems  Respiratory: Positive for shortness of breath.   Cardiovascular: Positive for leg swelling.  Gastrointestinal: Negative for abdominal pain.  Genitourinary: Positive for scrotal swelling.  All other systems reviewed and are negative.     Allergies  Review of patient's allergies indicates no known allergies.  Home Medications   Prior to Admission medications   Medication Sig Start Date End Date Taking? Authorizing Provider  Ascorbic Acid (VITAMIN C PO) Take 1 tablet by mouth daily.    Historical Provider, MD  aspirin EC 81 MG tablet Take 81 mg by mouth daily.    Historical Provider, MD  atorvastatin (LIPITOR) 10 MG tablet Take 10 mg by mouth daily.    Historical Provider, MD  B Complex-C (B-COMPLEX WITH VITAMIN C) tablet Take 1 tablet  by mouth daily.    Historical Provider, MD  Cholecalciferol (VITAMIN D PO) Take 1 tablet by mouth daily.    Historical Provider, MD  Coenzyme Q10 (CO Q 10 PO) Take 1 tablet by mouth daily.     Historical Provider, MD  Cyanocobalamin (VITAMIN B-12 PO) Take 1 tablet by mouth daily.    Historical Provider, MD  finasteride (PROSCAR) 5 MG tablet Take 5 mg by mouth at bedtime.    Historical Provider, MD  furosemide (LASIX) 20 MG tablet Take 10 mg by mouth daily.    Historical Provider, MD  guaifenesin (MUCUS RELIEF) 400 MG TABS tablet Take 400 mg by mouth every 4 (four) hours as needed (for mucus).    Historical Provider, MD  HYDROcodone-acetaminophen (NORCO/VICODIN) 5-325 MG tablet Take 2 tablets by mouth every 4 (four) hours as needed. 12/24/15   [name], MD  lactose free nutrition (BOOST) LIQD Take 237 mLs by mouth 2 (two) times daily.    Historical Provider, MD  levothyroxine (SYNTHROID, LEVOTHROID) 175 MCG tablet Take 137 mcg by mouth every evening.     Historical Provider, MD  lidocaine (LIDODERM) 5 % Place 1 patch onto the skin daily as needed (pain). Reported on 11/27/2015    Historical Provider, MD  magnesium oxide (MAG-OX) 400 MG tablet Take 400 mg by mouth 2 (two) times daily.    Historical Provider, MD  Melatonin 3 MG TABS Take 2 tablets by mouth daily.    Historical Provider, MD  pantoprazole (PROTONIX) 40 MG tablet Take 40 mg by mouth 2 (two) times daily.    Historical Provider, MD  Resveratrol 250 MG CAPS Take 1 capsule by mouth 2 (two) times daily.    Historical Provider, MD  ropinirole (REQUIP) 5 MG tablet Take 5 mg by mouth at bedtime.    Historical Provider, MD  Tamsulosin HCl (FLOMAX) 0.4 MG CAPS Take 0.4 mg by mouth 2 (two) times daily.     Historical Provider, MD   Triage vitals: BP 108/60 mmHg  Pulse 71  Temp(Src) 97.7 F (36.5 C) (Oral)  Resp 17  SpO2 94% Physical Exam  Constitutional: He is oriented to person, place, and time. He appears well-developed and  well-nourished.  HENT:  Head: Normocephalic and atraumatic.  Eyes: EOM are normal.  Neck: Normal range of motion.  Trach in place  Cardiovascular: Normal rate, regular rhythm, normal heart sounds and intact distal pulses.   Pulmonary/Chest: Effort normal and breath sounds normal. No respiratory distress.  Abdominal: Soft. He exhibits no distension. There is no tenderness.  Musculoskeletal: Normal range of motion.  Swelling in bilateral feet.  Neurological: He is alert and oriented to person, place, and time.  Skin: Skin is warm and dry.  Psychiatric: He has a normal mood and affect. Judgment normal.  Nursing note and vitals reviewed.   ED Course  Procedures  DIAGNOSTIC STUDIES: Oxygen Saturation is 94% on RA, normal by my interpretation.  COORDINATION OF CARE:  10:58 PM Will order CXR. Discussed treatment plan with pt at bedside and pt agreed to plan.  Labs Review Labs Reviewed  CBC WITH DIFFERENTIAL/PLATELET - Abnormal; Notable for the following:    RBC 3.37 (*)    Hemoglobin 11.5 (*)    HCT 33.4 (*)    MCH 34.1 (*)    All other components within normal limits  COMPREHENSIVE METABOLIC PANEL - Abnormal; Notable for the following:    Sodium 131 (*)    Chloride 97 (*)    BUN 32 (*)    Calcium 8.1 (*)    Total Protein 6.2 (*)    Albumin 3.3 (*)    All other components within normal limits  BRAIN NATRIURETIC PEPTIDE - Abnormal; Notable for the following:    B Natriuretic Peptide 1710.0 (*)    All other components within normal limits  TROPONIN I    Imaging Review No results found. I have personally reviewed and evaluated these images and lab results as part of my medical decision-making.   EKG Interpretation   Date/Time:  Thursday Jan 17 2016 23:47:11 EDT Ventricular Rate:  81 PR Interval:  231 QRS Duration: 145 QT Interval:  482 QTC Calculation: 560 R Axis:   83 Text Interpretation:  Ventricular-paced complexes No further analysis  attempted due to paced  rhythm No significant change since last tracing  Confirmed by WARD,  DO, KRISTEN [id number]) on 01/18/2016 12:16:04 AM      MDM   Final diagnoses:  Acute on chronic systolic congestive heart failure ([place])  Edema extremities      Andre Meadow, PA-C 01/18/16 [id number]

## 2016-01-17 NOTE — Progress Notes (Signed)
Called to suction pt... Listen to pt... No suction needed.. Pt states he feels dry.. Cleaned around stoma and small piece of dry secretion pulled from stoma and patient placed on 28% trach collar.

## 2016-01-18 ENCOUNTER — Observation Stay (HOSPITAL_BASED_OUTPATIENT_CLINIC_OR_DEPARTMENT_OTHER): Payer: Medicare HMO

## 2016-01-18 ENCOUNTER — Encounter (HOSPITAL_COMMUNITY): Payer: Self-pay | Admitting: Internal Medicine

## 2016-01-18 DIAGNOSIS — E46 Unspecified protein-calorie malnutrition: Secondary | ICD-10-CM

## 2016-01-18 DIAGNOSIS — R609 Edema, unspecified: Secondary | ICD-10-CM | POA: Diagnosis not present

## 2016-01-18 DIAGNOSIS — I5023 Acute on chronic systolic (congestive) heart failure: Secondary | ICD-10-CM | POA: Diagnosis not present

## 2016-01-18 DIAGNOSIS — I427 Cardiomyopathy due to drug and external agent: Secondary | ICD-10-CM

## 2016-01-18 DIAGNOSIS — R600 Localized edema: Secondary | ICD-10-CM | POA: Insufficient documentation

## 2016-01-18 DIAGNOSIS — E871 Hypo-osmolality and hyponatremia: Secondary | ICD-10-CM | POA: Diagnosis not present

## 2016-01-18 DIAGNOSIS — R0602 Shortness of breath: Secondary | ICD-10-CM | POA: Diagnosis present

## 2016-01-18 DIAGNOSIS — E039 Hypothyroidism, unspecified: Secondary | ICD-10-CM

## 2016-01-18 LAB — OSMOLALITY: OSMOLALITY: 283 mosm/kg (ref 275–295)

## 2016-01-18 LAB — CBC
HCT: 31.9 % — ABNORMAL LOW (ref 39.0–52.0)
Hemoglobin: 10.9 g/dL — ABNORMAL LOW (ref 13.0–17.0)
MCH: 33.9 pg (ref 26.0–34.0)
MCHC: 34.2 g/dL (ref 30.0–36.0)
MCV: 99.1 fL (ref 78.0–100.0)
PLATELETS: 189 10*3/uL (ref 150–400)
RBC: 3.22 MIL/uL — ABNORMAL LOW (ref 4.22–5.81)
RDW: 13 % (ref 11.5–15.5)
WBC: 5.1 10*3/uL (ref 4.0–10.5)

## 2016-01-18 LAB — COMPREHENSIVE METABOLIC PANEL
ALBUMIN: 3.3 g/dL — AB (ref 3.5–5.0)
ALK PHOS: 92 U/L (ref 38–126)
ALT: 23 U/L (ref 17–63)
ANION GAP: 8 (ref 5–15)
AST: 26 U/L (ref 15–41)
BUN: 29 mg/dL — ABNORMAL HIGH (ref 6–20)
CALCIUM: 8.3 mg/dL — AB (ref 8.9–10.3)
CHLORIDE: 96 mmol/L — AB (ref 101–111)
CO2: 28 mmol/L (ref 22–32)
Creatinine, Ser: 0.98 mg/dL (ref 0.61–1.24)
GFR calc non Af Amer: >60 mL/min (ref >60)
GLUCOSE: 91 mg/dL (ref 65–99)
POTASSIUM: 3.6 mmol/L (ref 3.5–5.1)
SODIUM: 132 mmol/L — AB (ref 135–145)
Total Bilirubin: 0.6 mg/dL (ref 0.3–1.2)
Total Protein: 6.2 g/dL — ABNORMAL LOW (ref 6.5–8.1)

## 2016-01-18 LAB — ECHOCARDIOGRAM COMPLETE
HEIGHTINCHES: 73 in
Weight: 2846.58 oz

## 2016-01-18 LAB — TROPONIN I: Troponin I: 0.03 ng/mL (ref <0.031)

## 2016-01-18 LAB — OSMOLALITY, URINE: OSMOLALITY UR: 344 mosm/kg (ref 300–900)

## 2016-01-18 LAB — SODIUM, URINE, RANDOM: SODIUM UR: 113 mmol/L

## 2016-01-18 LAB — CORTISOL: Cortisol, Plasma: 12.7 ug/dL

## 2016-01-18 MED ORDER — SODIUM CHLORIDE 0.9% FLUSH
3.0000 mL | Freq: Two times a day (BID) | INTRAVENOUS | Status: DC
  Administered 2016-01-18 – 2016-01-23 (×11): 3 mL via INTRAVENOUS

## 2016-01-18 MED ORDER — FUROSEMIDE 10 MG/ML IJ SOLN
40.0000 mg | Freq: Every day | INTRAMUSCULAR | Status: DC
  Administered 2016-01-18 – 2016-01-22 (×5): 40 mg via INTRAVENOUS
  Filled 2016-01-18 (×5): qty 4

## 2016-01-18 MED ORDER — SODIUM CHLORIDE 0.9% FLUSH: 3.0000 mL | INTRAVENOUS | Status: DC | PRN

## 2016-01-18 MED ORDER — DICLOFENAC SODIUM 1 % TD GEL: 2.0000 g | Freq: Four times a day (QID) | TRANSDERMAL | Status: DC | PRN

## 2016-01-18 MED ORDER — FUROSEMIDE 10 MG/ML IJ SOLN
40.0000 mg | Freq: Once | INTRAMUSCULAR | Status: AC
  Administered 2016-01-18: 40 mg via INTRAVENOUS
  Filled 2016-01-18: qty 4

## 2016-01-18 MED ORDER — MAGNESIUM OXIDE 400 (241.3 MG) MG PO TABS
400.0000 mg | ORAL_TABLET | Freq: Two times a day (BID) | ORAL | Status: DC
  Administered 2016-01-18 – 2016-01-23 (×12): 400 mg via ORAL
  Filled 2016-01-18 (×12): qty 1

## 2016-01-18 MED ORDER — DICLOFENAC SODIUM 1 % TD GEL
2.0000 g | Freq: Four times a day (QID) | TRANSDERMAL | Status: DC
  Administered 2016-01-18 (×2): 2 g via TOPICAL
  Filled 2016-01-18: qty 100

## 2016-01-18 MED ORDER — GUAIFENESIN 200 MG PO TABS
400.0000 mg | ORAL_TABLET | ORAL | Status: DC | PRN
  Filled 2016-01-18: qty 2

## 2016-01-18 MED ORDER — ASPIRIN EC 81 MG PO TBEC
81.0000 mg | DELAYED_RELEASE_TABLET | Freq: Every day | ORAL | Status: DC
  Administered 2016-01-18 – 2016-01-23 (×6): 81 mg via ORAL
  Filled 2016-01-18 (×6): qty 1

## 2016-01-18 MED ORDER — CHLORHEXIDINE GLUCONATE 0.12 % MT SOLN
15.0000 mL | Freq: Two times a day (BID) | OROMUCOSAL | Status: DC
  Administered 2016-01-18 – 2016-01-22 (×9): 15 mL via OROMUCOSAL
  Filled 2016-01-18 (×9): qty 15

## 2016-01-18 MED ORDER — SODIUM CHLORIDE 0.9 % IV SOLN: 250.0000 mL | INTRAVENOUS | Status: DC | PRN

## 2016-01-18 MED ORDER — TAMSULOSIN HCL 0.4 MG PO CAPS
0.4000 mg | ORAL_CAPSULE | Freq: Two times a day (BID) | ORAL | Status: DC
  Administered 2016-01-18 – 2016-01-23 (×12): 0.4 mg via ORAL
  Filled 2016-01-18 (×12): qty 1

## 2016-01-18 MED ORDER — SODIUM CHLORIDE 0.9% FLUSH
3.0000 mL | Freq: Two times a day (BID) | INTRAVENOUS | Status: DC
  Administered 2016-01-23: 3 mL via INTRAVENOUS

## 2016-01-18 MED ORDER — FINASTERIDE 5 MG PO TABS
5.0000 mg | ORAL_TABLET | Freq: Every day | ORAL | Status: DC
  Administered 2016-01-18 – 2016-01-22 (×5): 5 mg via ORAL
  Filled 2016-01-18 (×7): qty 1

## 2016-01-18 MED ORDER — ATORVASTATIN CALCIUM 10 MG PO TABS
10.0000 mg | ORAL_TABLET | Freq: Every day | ORAL | Status: DC
Start: 2016-01-18 — End: 2016-01-23
  Administered 2016-01-18 – 2016-01-23 (×6): 10 mg via ORAL
  Filled 2016-01-18 (×6): qty 1

## 2016-01-18 MED ORDER — CETYLPYRIDINIUM CHLORIDE 0.05 % MT LIQD
7.0000 mL | Freq: Two times a day (BID) | OROMUCOSAL | Status: DC
  Administered 2016-01-18 – 2016-01-21 (×7): 7 mL via OROMUCOSAL

## 2016-01-18 MED ORDER — ENOXAPARIN SODIUM 40 MG/0.4ML ~~LOC~~ SOLN
40.0000 mg | SUBCUTANEOUS | Status: DC
  Administered 2016-01-18 – 2016-01-23 (×6): 40 mg via SUBCUTANEOUS
  Filled 2016-01-18 (×6): qty 0.4

## 2016-01-18 MED ORDER — PANTOPRAZOLE SODIUM 40 MG PO TBEC
40.0000 mg | DELAYED_RELEASE_TABLET | Freq: Two times a day (BID) | ORAL | Status: DC
  Administered 2016-01-18 – 2016-01-23 (×12): 40 mg via ORAL
  Filled 2016-01-18 (×12): qty 1

## 2016-01-18 MED ORDER — LEVOTHYROXINE SODIUM 25 MCG PO TABS
137.0000 ug | ORAL_TABLET | Freq: Every evening | ORAL | Status: DC
  Administered 2016-01-18 – 2016-01-22 (×5): 137 ug via ORAL
  Filled 2016-01-18 (×5): qty 1

## 2016-01-18 MED ORDER — GUAIFENESIN 100 MG/5ML PO SOLN: 20.0000 mL | ORAL | Status: DC | PRN

## 2016-01-18 MED ORDER — ASPIRIN 81 MG PO CHEW
324.0000 mg | CHEWABLE_TABLET | Freq: Once | ORAL | Status: AC
  Administered 2016-01-18: 324 mg via ORAL
  Filled 2016-01-18: qty 4

## 2016-01-18 MED ORDER — ROPINIROLE HCL 1 MG PO TABS
5.0000 mg | ORAL_TABLET | Freq: Every day | ORAL | Status: DC
  Administered 2016-01-18 – 2016-01-22 (×5): 5 mg via ORAL
  Filled 2016-01-18 (×5): qty 5

## 2016-01-18 MED ORDER — SACUBITRIL-VALSARTAN 24-26 MG PO TABS
1.0000 | ORAL_TABLET | Freq: Two times a day (BID) | ORAL | Status: DC
  Administered 2016-01-18 – 2016-01-23 (×12): 1 via ORAL
  Filled 2016-01-18 (×16): qty 1

## 2016-01-18 MED ORDER — DICLOFENAC SODIUM 1 % TD GEL
TRANSDERMAL | Status: AC
  Filled 2016-01-18: qty 100

## 2016-01-18 MED ORDER — BOOST PO LIQD
237.0000 mL | Freq: Two times a day (BID) | ORAL | Status: DC
  Filled 2016-01-18 (×2): qty 237

## 2016-01-18 MED ORDER — BOOST / RESOURCE BREEZE PO LIQD
1.0000 | Freq: Two times a day (BID) | ORAL | Status: DC
  Administered 2016-01-18 – 2016-01-22 (×8): 1 via ORAL

## 2016-01-18 NOTE — H&P (Addendum)
TRH H&P   Patient Demographics:    Andre Holder, is a 82 y.o. male  MRN: [id number]   DOB - 07/04/1923  Admit Date - 01/17/2016  Outpatient Primary MD for the patient is Asencion Noble, MD  Referring MD/NP/PA: Kela Millin P.A.  Outpatient Specialists:  [facility]   Patient coming from: home  Chief Complaint  Patient presents with  . Leg Swelling      HPI:    Andre Holder  is a 82 y.o. male, with hx of CHF (EF 20-25%), Mobitz type 2 s/p pacer, CVA, PVD, bilateral carotid stenosis, apparently presents w/ c/o bilateral lower ext edema as well as dyspnea which was gradual over the coarse of several days.  Pt was seen in ED 01/14/2016 and tx with iv lasix and then today apparently seen by Dr. Tilden Dome and started on Torsemide. Pt has had slight increase in dyspnea and therefore presented to ED for evaluation.  CXR showed ? Vascular congestion. Pt will be admitted for bilateral lower ext edema and possible mild CHF.     Review of systems:    In addition to the HPI above,  No Fever-chills, No Headache, No changes with Vision or hearing, No problems swallowing food or Liquids, No Chest pain, Cough  No Abdominal pain, No Nausea or Vommitting, Bowel movements are regular, No Blood in stool or Urine, No dysuria, No new skin rashes or bruises, No new joints pains-aches,  No new weakness, tingling, numbness in any extremity, Slight recent weight gain per pt No polyuria, polydypsia or polyphagia, No significant Mental Stressors.  A full 10 point Review of Systems was done, except as stated above, all other Review of Systems were negative.   With Past History of the following :    Past Medical History  Diagnosis Date  . Arteriosclerotic cardiovascular disease (ASCVD)     Nonobstructive; 09/2008 50% proximal and 40% mid LAD; 25% circumflex; 30% RCA; mild global LV dysfunction with EF of  45%. No aortic stenosis.  . Mild aortic stenosis     not documented at catheterization; verified by echo in 2011  . Peripheral vascular disease ([place])     With a 70% innominate artery stenosis and nonobstructive carotid stenosis  . Hypothyroidism   . Degenerative joint disease     s/p bilateral TKR  . Mobitz (type) II atrioventricular block     With bradycardia; Medtronic pacemaker implanted in 09/2008  . Tobacco abuse, in remission     Remote  . GERD (gastroesophageal reflux disease)   . Hyperlipidemia     Lipid profile in 04/2010:115, 98, 43, 52.  . Weight loss     50 pounds between 1991 and 2011  . 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 Adrenal hyperplasia ([place])     Stable on serial imaging  . Anemia  minimal in 2011 with hemoglobin of 12.2 and high normal MCV  . Borderline hypertension     Normal CMet in 2011  . Cancer of larynx ([place])     laryngectomy in 1988; postoperative radiation therapy  . Skin cancer   . Carotid stenosis   . Mitral regurgitation       Past Surgical History  Procedure Laterality Date  . Laryngectomy  1988    S/P laryngectomy and radiation therapy  . Appendectomy  1973  . Knee arthroscopy      Left  . Total knee arthroplasty      Bilateral, 19 years ago  . Cataract extraction, bilateral    . Decompression facial nerve      Right median  . Pacemaker insertion    . Insert / replace / remove pacemaker        Social History:     Social History  Substance Use Topics  . Smoking status: Former Smoker    Types: Cigarettes  . Smokeless tobacco: Former Systems developer    Quit date: 09/08/1982     Comment: Quit 30 years  . Alcohol Use: No     Lives -  At home  Mobility - unable to assess   Family History :     Family History  Problem Relation Age of Onset  . Stroke Mother   . Leukemia Father   . Colon cancer Neg Hx   . Liver disease Neg Hx   . GI problems Neg Hx   . Stroke Other   . Diabetes  Other       Home Medications:   Prior to Admission medications   Medication Sig Start Date End Date Taking? Authorizing Provider  Ascorbic Acid (VITAMIN C PO) Take 1 tablet by mouth daily.    Historical Provider, MD  aspirin EC 81 MG tablet Take 81 mg by mouth daily.    Historical Provider, MD  atorvastatin (LIPITOR) 10 MG tablet Take 10 mg by mouth daily.    Historical Provider, MD  B Complex-C (B-COMPLEX WITH VITAMIN C) tablet Take 1 tablet by mouth daily.    Historical Provider, MD  Cholecalciferol (VITAMIN D PO) Take 1 tablet by mouth daily.    Historical Provider, MD  Coenzyme Q10 (CO Q 10 PO) Take 1 tablet by mouth daily.     Historical Provider, MD  Cyanocobalamin (VITAMIN B-12 PO) Take 1 tablet by mouth daily.    Historical Provider, MD  finasteride (PROSCAR) 5 MG tablet Take 5 mg by mouth at bedtime.    Historical Provider, MD  furosemide (LASIX) 20 MG tablet Take 10 mg by mouth daily.    Historical Provider, MD  guaifenesin (MUCUS RELIEF) 400 MG TABS tablet Take 400 mg by mouth every 4 (four) hours as needed (for mucus).    Historical Provider, MD  HYDROcodone-acetaminophen (NORCO/VICODIN) 5-325 MG tablet Take 2 tablets by mouth every 4 (four) hours as needed. 12/24/15   [name], MD  lactose free nutrition (BOOST) LIQD Take 237 mLs by mouth 2 (two) times daily.    Historical Provider, MD  levothyroxine (SYNTHROID, LEVOTHROID) 175 MCG tablet Take 137 mcg by mouth every evening.     Historical Provider, MD  lidocaine (LIDODERM) 5 % Place 1 patch onto the skin daily as needed (pain). Reported on 11/27/2015    Historical Provider, MD  magnesium oxide (MAG-OX) 400 MG tablet Take 400 mg by mouth 2 (two) times daily.    Historical Provider, MD  Melatonin 3  MG TABS Take 2 tablets by mouth daily.    Historical Provider, MD  pantoprazole (PROTONIX) 40 MG tablet Take 40 mg by mouth 2 (two) times daily.    Historical Provider, MD  Resveratrol 250 MG CAPS Take 1 capsule by mouth 2 (two)  times daily.    Historical Provider, MD  ropinirole (REQUIP) 5 MG tablet Take 5 mg by mouth at bedtime.    Historical Provider, MD  Tamsulosin HCl (FLOMAX) 0.4 MG CAPS Take 0.4 mg by mouth 2 (two) times daily.     Historical Provider, MD     Allergies:    No Known Allergies   Physical Exam:   Vitals  Blood pressure 117/99, pulse 74, temperature 97.7 F (36.5 C), temperature source Oral, resp. rate 18, SpO2 95 %.   1. General elderly white male lying in bed in NAD,   2. Normal affect and insight, Not Suicidal or Homicidal, Awake Alert, Oriented X 3.  3. No F.N deficits, ALL C.Nerves Intact, Strength 5/5 all 4 extremities, Sensation intact all 4 extremities, Plantars down going.  4. Ears and Eyes appear Normal, Conjunctivae clear, PERRLA. Moist Oral Mucosa.  5. Supple Neck, No JVD, No cervical lymphadenopathy appriciated, + tracheostomy, + Carotid Bruits.  6. Symmetrical Chest wall movement, Good air movement bilaterally, slight crackle bilateral right > left base, no wheeze  7. RRR, 2/6 sem rusb/ apex,  No Gallops, Rubs , No Parasternal Heave.  8. Positive Bowel Sounds, Abdomen Soft, No tenderness, No organomegaly appriciated,No rebound -guarding or rigidity.  9.  No Cyanosis, Normal Skin Turgor, No Skin Rash or Bruise.  + bilateral pitting edema, up to knees, ,   10. Good muscle tone,  joints appear normal , no effusions, Normal ROM., + bilateral TKA scars, + hammer toes, high arches  11. No Palpable Lymph Nodes in Neck or Axillae    Data Review:    CBC  Recent Labs Lab 01/14/16 1407 01/17/16 2311  WBC 5.2 5.4  HGB 11.2* 11.5*  HCT 32.7* 33.4*  PLT 182 184  MCV 100.3* 99.1  MCH 34.4* 34.1*  MCHC 34.3 34.4  RDW 13.0 13.0  LYMPHSABS 0.9 0.8  MONOABS 0.5 0.5  EOSABS 0.2 0.2  BASOSABS 0.0 0.0   ------------------------------------------------------------------------------------------------------------------  Chemistries   Recent Labs Lab 01/14/16 1407  01/17/16 2311  NA 130* 131*  K 4.6 4.0  CL 96* 97*  CO2 26 26  GLUCOSE 80 98  BUN 28* 32*  CREATININE 0.87 1.00  CALCIUM 8.8* 8.1*  AST 29 28  ALT 24 23  ALKPHOS 96 99  BILITOT 0.6 0.6   ------------------------------------------------------------------------------------------------------------------ estimated creatinine clearance is 55.5 mL/min (by C-G formula based on Cr of 1). ------------------------------------------------------------------------------------------------------------------ No results for input(s): TSH, T4TOTAL, T3FREE, THYROIDAB in the last 72 hours.  Invalid input(s): FREET3  Coagulation profile No results for input(s): INR, PROTIME in the last 168 hours. ------------------------------------------------------------------------------------------------------------------- No results for input(s): DDIMER in the last 72 hours. -------------------------------------------------------------------------------------------------------------------  Cardiac Enzymes  Recent Labs Lab 01/17/16 2311  TROPONINI 0.03   ------------------------------------------------------------------------------------------------------------------    Component Value Date/Time   BNP 1710.0* 01/17/2016 2311     ---------------------------------------------------------------------------------------------------------------  Urinalysis    Component Value Date/Time   COLORURINE YELLOW 11/05/2015 0925   APPEARANCEUR CLEAR 11/05/2015 0925   LABSPEC 1.010 11/05/2015 0925   PHURINE 6.0 11/05/2015 0925   GLUCOSEU NEGATIVE 11/05/2015 0925   HGBUR TRACE* 11/05/2015 0925   [place] NEGATIVE 11/05/2015 0925   KETONESUR NEGATIVE 11/05/2015 0925   PROTEINUR TRACE*  11/05/2015 0925   UROBILINOGEN 0.2 02/10/2014 1720   NITRITE NEGATIVE 11/05/2015 0925   LEUKOCYTESUR NEGATIVE 11/05/2015 0925     ----------------------------------------------------------------------------------------------------------------   Imaging Results:    Dg Chest 2 View  01/18/2016  CLINICAL DATA:  82 year old male with shortness of breath EXAM: CHEST  2 VIEW COMPARISON:  Radiograph dated 01/05/2016 and CT dated 05/31/2015 FINDINGS: There is mild diffuse interstitial coarsening may represent a degree of edema or congestive changes. There is stable eventration of the left hemidiaphragm. Minimal bibasilar atelectatic changes. There is no focal consolidation. No pleural effusion or pneumothorax. Stable cardiomegaly. Left pectoral pacemaker device. No acute osseous pathology. IMPRESSION: Probable mild edema versus congestion.  No focal consolidation. Electronically Signed   By: Anner Crete M.D.   On: 01/18/2016 00:02      Assessment & Plan:    Active Problems:   Hypothyroidism   Hyponatremia   CHF exacerbation ([facility])   Anemia   Shortness of breath   Edema   Protein calorie malnutrition ([place])    1. Edema Check urine protein, creatinine Check tsh Check cardiac 2d echo Lasix 27m iv qday  2.  Acute on chronic CHF.  Check echo  Check trop i q6h x3 Lasix 479miv qday Start entresto 24/26 bid  3. Anemia Consider further w/up w ferritin, iron/tibc, b12, folate, esr, spep, upep Check cbc in am.   4. Hyponatremia Check serum osm, cortisol, tsh, urine osm, urine sodium  5. Protein calorie malnutrition Cont boost.   DVT Prophylaxis Lovenox  AM Labs Ordered, also please review Full Orders  Family Communication: Admission, patients condition and plan of care including tests being ordered have been discussed with the patient  who indicate understanding and agree with the plan and Code Status.  Code Status FULL CODE  Likely DC to  ? snf vs home  Condition GUARDED    Consults called:  none  Admission status: observation  Time spent in minutes : 40 minutes   JaJani Gravel.D on  01/18/2016 at 1:19 AM [phone] 7am to 7pm - Pager - [phone] 7pm go to [email or web address] - password [email or web address] - Office  [phone]

## 2016-01-18 NOTE — Care Management Obs Status (Signed)
[place] NOTIFICATION   Patient Details  Name: Andre Holder MRN: [id number] Date of Birth: 08-14-1923   Medicare Observation Status Notification Given:  Yes    Alvie Heidelberg, RN 01/18/2016, 4:58 PM

## 2016-01-18 NOTE — Progress Notes (Signed)
Subjective: He was admitted with heart failure. I saw him in my office yesterday and adjusted his diuretics but he said it didn't make any difference and in fact he got worse  Objective: Vital signs in last 24 hours: Temp:  [97.7 F (36.5 C)-98.1 F (36.7 C)] 98.1 F (36.7 C) (05/12 0623) Pulse Rate:  [60-74] 60 (05/12 0623) Resp:  [17-20] 20 (05/12 0623) BP: (108-121)/(53-99) 116/53 mmHg (05/12 0623) SpO2:  [93 %-99 %] 93 % (05/12 0623) FiO2 (%):  [28 %] 28 % (05/12 0623) Weight:  [80.7 kg (177 lb 14.6 oz)-85 kg (187 lb 6.3 oz)] 80.7 kg (177 lb 14.6 oz) (05/12 0947) Weight change:  Last BM Date: 01/18/16  Intake/Output from previous day: 05/11 0701 - 05/12 0700 In: -  Out: 1050 [Urine:1050]  PHYSICAL EXAM General appearance: alert, cooperative, mild distress and He has a laryngectomy and speaks with an electronic voice box Resp: rhonchi bilaterally Cardio: regular rate and rhythm, S1, S2 normal, no murmur, click, rub or gallop GI: soft, non-tender; bowel sounds normal; no masses,  no organomegaly Extremities: He has edema of his legs of his scrotum and penis  Lab Results:  Results for orders placed or performed during the hospital encounter of 01/17/16 (from the past 48 hour(s))  CBC with Differential/Platelet     Status: Abnormal   Collection Time: 01/17/16 11:11 PM  Result Value Ref Range   WBC 5.4 4.0 - 10.5 K/uL   RBC 3.37 (L) 4.22 - 5.81 MIL/uL   Hemoglobin 11.5 (L) 13.0 - 17.0 g/dL   HCT 33.4 (L) 39.0 - 52.0 %   MCV 99.1 78.0 - 100.0 fL   MCH 34.1 (H) 26.0 - 34.0 pg   MCHC 34.4 30.0 - 36.0 g/dL   RDW 13.0 11.5 - 15.5 %   Platelets 184 150 - 400 K/uL   Neutrophils Relative % 71 %   Neutro Abs 3.9 1.7 - 7.7 K/uL   Lymphocytes Relative 15 %   Lymphs Abs 0.8 0.7 - 4.0 K/uL   Monocytes Relative 9 %   Monocytes Absolute 0.5 0.1 - 1.0 K/uL   Eosinophils Relative 4 %   Eosinophils Absolute 0.2 0.0 - 0.7 K/uL   Basophils Relative 1 %   Basophils Absolute 0.0 0.0 -  0.1 K/uL  Comprehensive metabolic panel     Status: Abnormal   Collection Time: 01/17/16 11:11 PM  Result Value Ref Range   Sodium 131 (L) 135 - 145 mmol/L   Potassium 4.0 3.5 - 5.1 mmol/L   Chloride 97 (L) 101 - 111 mmol/L   CO2 26 22 - 32 mmol/L   Glucose, Bld 98 65 - 99 mg/dL   BUN 32 (H) 6 - 20 mg/dL   Creatinine, Ser 1.00 0.61 - 1.24 mg/dL   Calcium 8.1 (L) 8.9 - 10.3 mg/dL   Total Protein 6.2 (L) 6.5 - 8.1 g/dL   Albumin 3.3 (L) 3.5 - 5.0 g/dL   AST 28 15 - 41 U/L   ALT 23 17 - 63 U/L   Alkaline Phosphatase 99 38 - 126 U/L   Total Bilirubin 0.6 0.3 - 1.2 mg/dL   GFR calc non Af Amer >60 >60 mL/min   GFR calc Af Amer >60 >60 mL/min    Comment: (NOTE) The eGFR has been calculated using the CKD EPI equation. This calculation has not been validated in all clinical situations. eGFR's persistently <60 mL/min signify possible Chronic Kidney Disease.    Anion gap 8 5 - 15  Brain natriuretic peptide     Status: Abnormal   Collection Time: 01/17/16 11:11 PM  Result Value Ref Range   B Natriuretic Peptide 1710.0 (H) 0.0 - 100.0 pg/mL  Troponin I     Status: None   Collection Time: 01/17/16 11:11 PM  Result Value Ref Range   Troponin I 0.03 <0.031 ng/mL    Comment:        NO INDICATION OF MYOCARDIAL INJURY.   CBC     Status: Abnormal   Collection Time: 01/18/16  4:43 AM  Result Value Ref Range   WBC 5.1 4.0 - 10.5 K/uL   RBC 3.22 (L) 4.22 - 5.81 MIL/uL   Hemoglobin 10.9 (L) 13.0 - 17.0 g/dL   HCT 31.9 (L) 39.0 - 52.0 %   MCV 99.1 78.0 - 100.0 fL   MCH 33.9 26.0 - 34.0 pg   MCHC 34.2 30.0 - 36.0 g/dL   RDW 13.0 11.5 - 15.5 %   Platelets 189 150 - 400 K/uL  Comprehensive metabolic panel     Status: Abnormal   Collection Time: 01/18/16  4:43 AM  Result Value Ref Range   Sodium 132 (L) 135 - 145 mmol/L   Potassium 3.6 3.5 - 5.1 mmol/L   Chloride 96 (L) 101 - 111 mmol/L   CO2 28 22 - 32 mmol/L   Glucose, Bld 91 65 - 99 mg/dL   BUN 29 (H) 6 - 20 mg/dL   Creatinine, Ser  0.98 0.61 - 1.24 mg/dL   Calcium 8.3 (L) 8.9 - 10.3 mg/dL   Total Protein 6.2 (L) 6.5 - 8.1 g/dL   Albumin 3.3 (L) 3.5 - 5.0 g/dL   AST 26 15 - 41 U/L   ALT 23 17 - 63 U/L   Alkaline Phosphatase 92 38 - 126 U/L   Total Bilirubin 0.6 0.3 - 1.2 mg/dL   GFR calc non Af Amer >60 >60 mL/min   GFR calc Af Amer >60 >60 mL/min    Comment: (NOTE) The eGFR has been calculated using the CKD EPI equation. This calculation has not been validated in all clinical situations. eGFR's persistently <60 mL/min signify possible Chronic Kidney Disease.    Anion gap 8 5 - 15  Sodium, urine, random     Status: None   Collection Time: 01/18/16  4:43 AM  Result Value Ref Range   Sodium, Ur 113 mmol/L    ABGS No results for input(s): PHART, PO2ART, TCO2, HCO3 in the last 72 hours.  Invalid input(s): PCO2 CULTURES No results found for this or any previous visit (from the past 240 hour(s)). Studies/Results: Dg Chest 2 View  01/18/2016  CLINICAL DATA:  82 year old male with shortness of breath EXAM: CHEST  2 VIEW COMPARISON:  Radiograph dated 01/05/2016 and CT dated 05/31/2015 FINDINGS: There is mild diffuse interstitial coarsening may represent a degree of edema or congestive changes. There is stable eventration of the left hemidiaphragm. Minimal bibasilar atelectatic changes. There is no focal consolidation. No pleural effusion or pneumothorax. Stable cardiomegaly. Left pectoral pacemaker device. No acute osseous pathology. IMPRESSION: Probable mild edema versus congestion.  No focal consolidation. Electronically Signed   By: Anner Crete M.D.   On: 01/18/2016 00:02    Medications:  Prior to Admission:  Prescriptions prior to admission  Medication Sig Dispense Refill Last Dose  . Ascorbic Acid (VITAMIN C PO) Take 1 tablet by mouth daily.   01/04/2016 at Unknown time  . aspirin EC 81 MG tablet Take 81 mg by  mouth daily.   01/04/2016 at Unknown time  . atorvastatin (LIPITOR) 10 MG tablet Take 10 mg by  mouth daily.   01/04/2016 at Unknown time  . B Complex-C (B-COMPLEX WITH VITAMIN C) tablet Take 1 tablet by mouth daily.   01/04/2016 at Unknown time  . Cholecalciferol (VITAMIN D PO) Take 1 tablet by mouth daily.   01/04/2016 at Unknown time  . Coenzyme Q10 (CO Q 10 PO) Take 1 tablet by mouth daily.    01/04/2016 at Unknown time  . Cyanocobalamin (VITAMIN B-12 PO) Take 1 tablet by mouth daily.   01/04/2016 at Unknown time  . finasteride (PROSCAR) 5 MG tablet Take 5 mg by mouth at bedtime.   01/04/2016 at Unknown time  . furosemide (LASIX) 20 MG tablet Take 10 mg by mouth daily.   01/04/2016 at Unknown time  . guaifenesin (MUCUS RELIEF) 400 MG TABS tablet Take 400 mg by mouth every 4 (four) hours as needed (for mucus).   01/05/2016 at Unknown time  . HYDROcodone-acetaminophen (NORCO/VICODIN) 5-325 MG tablet Take 2 tablets by mouth every 4 (four) hours as needed. 10 tablet 0 unknown  . lactose free nutrition (BOOST) LIQD Take 237 mLs by mouth 2 (two) times daily.   01/04/2016 at Unknown time  . levothyroxine (SYNTHROID, LEVOTHROID) 175 MCG tablet Take 137 mcg by mouth every evening.    01/04/2016 at Unknown time  . lidocaine (LIDODERM) 5 % Place 1 patch onto the skin daily as needed (pain). Reported on 11/27/2015   unknown  . magnesium oxide (MAG-OX) 400 MG tablet Take 400 mg by mouth 2 (two) times daily.   01/04/2016 at Unknown time  . Melatonin 3 MG TABS Take 2 tablets by mouth daily.   01/04/2016 at Unknown time  . pantoprazole (PROTONIX) 40 MG tablet Take 40 mg by mouth 2 (two) times daily.   01/04/2016 at Unknown time  . Resveratrol 250 MG CAPS Take 1 capsule by mouth 2 (two) times daily.   01/04/2016 at Unknown time  . ropinirole (REQUIP) 5 MG tablet Take 5 mg by mouth at bedtime.   01/04/2016 at Unknown time  . Tamsulosin HCl (FLOMAX) 0.4 MG CAPS Take 0.4 mg by mouth 2 (two) times daily.    01/04/2016 at Unknown time   Scheduled: . antiseptic oral rinse  7 mL Mouth Rinse q12n4p  . aspirin EC  81 mg Oral  Daily  . atorvastatin  10 mg Oral Daily  . chlorhexidine  15 mL Mouth Rinse BID  . diclofenac sodium  2 g Topical QID  . enoxaparin (LOVENOX) injection  40 mg Subcutaneous Q24H  . feeding supplement  1 Container Oral BID BM  . finasteride  5 mg Oral QHS  . furosemide  40 mg Intravenous Daily  . levothyroxine  137 mcg Oral QPM  . magnesium oxide  400 mg Oral BID  . pantoprazole  40 mg Oral BID  . ropinirole  5 mg Oral QHS  . sacubitril-valsartan  1 tablet Oral BID  . sodium chloride flush  3 mL Intravenous Q12H  . sodium chloride flush  3 mL Intravenous Q12H  . tamsulosin  0.4 mg Oral BID   Continuous:  WUJ:[id number] chloride, guaiFENesin, sodium chloride flush  Assesment: He is admitted with CHF. He has protein calorie malnutrition which exacerbates his situation. This is an assumption of care note. Active Problems:   Hypothyroidism   Hyponatremia   CHF exacerbation (HCC)   Anemia   Shortness of breath   Edema  Protein calorie malnutrition ([place])    Plan: Continue diuresis.      Kailin Principato L 01/18/2016, 8:54 AM

## 2016-01-18 NOTE — Care Management Note (Signed)
Case Management Note  Patient Details  Name: Andre Holder MRN: [id number] Date of Birth: 11/13/1922  Subjective/Objective: Spoke with patient for discharge assess. Alert and roiented from home. Has tracheostomy. Uses electronic voice wand for communication. Form home with spouse who assists in care. Stated that he uses a walker at home.  Denies issues with medications or transportation. No cm needs anticipated.  Continue to follow.                    Action/Plan: Home with self care.   Expected Discharge Date:                  Expected Discharge Plan:  Home/Self Care  In-House Referral:     Discharge planning Services  CM Consult  Post Acute Care Choice:    Choice offered to:     DME Arranged:    DME Agency:     HH Arranged:    [place] Agency:     Status of Service:  Completed, signed off  Medicare Important Message Given:    Date Medicare IM Given:    Medicare IM give by:    Date Additional Medicare IM Given:    Additional Medicare Important Message give by:     If discussed at [place] of Stay Meetings, dates discussed:    Additional Comments:  Alvie Heidelberg, RN 01/18/2016, 6:19 PM

## 2016-01-18 NOTE — ED Provider Notes (Signed)
Medical screening examination/treatment/procedure(s) were conducted as a shared visit with non-physician practitioner(s) and myself.  I personally evaluated the patient during the encounter.   EKG Interpretation   Date/Time:  Thursday Jan 17 2016 23:47:11 EDT Ventricular Rate:  81 PR Interval:  231 QRS Duration: 145 QT Interval:  482 QTC Calculation: 560 R Axis:   83 Text Interpretation:  Ventricular-paced complexes No further analysis  attempted due to paced rhythm No significant change since last tracing  Confirmed by Ellisha Bankson,  DO, December Hedtke [phone]) on 01/18/2016 12:16:04 AM      Pt is a 82 y.o. male with history of coronary artery disease, mild aortic stenosis, type II AV block status post pacemaker placement, history of laryngeal cancer status post tracheostomy who presents to the emergency department with complaints of increasing lower extreme swelling and pain. Per his daughter, patient noticed swelling in his scrotum today as well. Swelling has been progressively worsening. She states that he was complaining of chest pain and shortness of breath. He denies having any chest pain or shortness of breath currently. Does not wear oxygen at home. On exam, patient has some bibasilar crackles. No hypoxia. Mild systolic murmur on exam. He does have edema in his bilateral lower extremities to the midcalf. Patient's chest x-ray shows mild congestion. BNP is elevated but has improved since last visit in the emergency department 2 days ago. He reports he was just seen by his PCP today and started on torsemide. He has taken 1 dose of this medication. They're concerned that his symptoms were worsening and he began having chest pain and shortness of breath today and would like admission. We'll give IV diuresis and discuss with hospitalist for admission.  [place], DO 01/18/16 [phone]

## 2016-01-19 LAB — TROPONIN I
Troponin I: 0.04 ng/mL — ABNORMAL HIGH
Troponin I: 0.03 ng/mL (ref <0.031)
Troponin I: 0.03 ng/mL

## 2016-01-19 LAB — TSH: TSH: 11.038 u[IU]/mL — AB (ref 0.350–4.500)

## 2016-01-19 MED ORDER — POVIDONE-IODINE 10 % EX SOLN
CUTANEOUS | Status: AC
  Filled 2016-01-19: qty 118

## 2016-01-19 NOTE — Progress Notes (Signed)
Subjective: He says he feels better. He still has some fluid. No other new complaints  Objective: Vital signs in last 24 hours: Temp:  [97.3 F (36.3 C)-98.1 F (36.7 C)] 97.8 F (36.6 C) (05/13 0448) Pulse Rate:  [59-62] 62 (05/13 0448) Resp:  [18] 18 (05/13 0448) BP: (95-110)/(50-55) 95/53 mmHg (05/13 0448) SpO2:  [93 %-100 %] 98 % (05/13 0448) Weight change:  Last BM Date: 01/19/16  Intake/Output from previous day: 05/12 0701 - 05/13 0700 In: 480 [P.O.:480] Out: 2000 [Urine:2000]  PHYSICAL EXAM General appearance: alert, cooperative and With permanent tracheostomy and using an artificial voice box Resp: clear to auscultation bilaterally Cardio: irregularly irregular rhythm GI: soft, non-tender; bowel sounds normal; no masses,  no organomegaly Extremities: He still has some edema but he is improving  Lab Results:  Results for orders placed or performed during the hospital encounter of 01/17/16 (from the past 48 hour(s))  CBC with Differential/Platelet     Status: Abnormal   Collection Time: 01/17/16 11:11 PM  Result Value Ref Range   WBC 5.4 4.0 - 10.5 K/uL   RBC 3.37 (L) 4.22 - 5.81 MIL/uL   Hemoglobin 11.5 (L) 13.0 - 17.0 g/dL   HCT 33.4 (L) 39.0 - 52.0 %   MCV 99.1 78.0 - 100.0 fL   MCH 34.1 (H) 26.0 - 34.0 pg   MCHC 34.4 30.0 - 36.0 g/dL   RDW 13.0 11.5 - 15.5 %   Platelets 184 150 - 400 K/uL   Neutrophils Relative % 71 %   Neutro Abs 3.9 1.7 - 7.7 K/uL   Lymphocytes Relative 15 %   Lymphs Abs 0.8 0.7 - 4.0 K/uL   Monocytes Relative 9 %   Monocytes Absolute 0.5 0.1 - 1.0 K/uL   Eosinophils Relative 4 %   Eosinophils Absolute 0.2 0.0 - 0.7 K/uL   Basophils Relative 1 %   Basophils Absolute 0.0 0.0 - 0.1 K/uL  Comprehensive metabolic panel     Status: Abnormal   Collection Time: 01/17/16 11:11 PM  Result Value Ref Range   Sodium 131 (L) 135 - 145 mmol/L   Potassium 4.0 3.5 - 5.1 mmol/L   Chloride 97 (L) 101 - 111 mmol/L   CO2 26 22 - 32 mmol/L   Glucose, Bld 98 65 - 99 mg/dL   BUN 32 (H) 6 - 20 mg/dL   Creatinine, Ser 1.00 0.61 - 1.24 mg/dL   Calcium 8.1 (L) 8.9 - 10.3 mg/dL   Total Protein 6.2 (L) 6.5 - 8.1 g/dL   Albumin 3.3 (L) 3.5 - 5.0 g/dL   AST 28 15 - 41 U/L   ALT 23 17 - 63 U/L   Alkaline Phosphatase 99 38 - 126 U/L   Total Bilirubin 0.6 0.3 - 1.2 mg/dL   GFR calc non Af Amer >60 >60 mL/min   GFR calc Af Amer >60 >60 mL/min    Comment: (NOTE) The eGFR has been calculated using the CKD EPI equation. This calculation has not been validated in all clinical situations. eGFR's persistently <60 mL/min signify possible Chronic Kidney Disease.    Anion gap 8 5 - 15  Brain natriuretic peptide     Status: Abnormal   Collection Time: 01/17/16 11:11 PM  Result Value Ref Range   B Natriuretic Peptide 1710.0 (H) 0.0 - 100.0 pg/mL  Troponin I     Status: None   Collection Time: 01/17/16 11:11 PM  Result Value Ref Range   Troponin I 0.03 <0.031 ng/mL  Comment:        NO INDICATION OF MYOCARDIAL INJURY.   Osmolality, urine     Status: None   Collection Time: 01/18/16  4:32 AM  Result Value Ref Range   Osmolality, Ur 344 300 - 900 mOsm/kg    Comment: Performed at [facility]  TSH     Status: Abnormal   Collection Time: 01/18/16  4:43 AM  Result Value Ref Range   TSH 11.038 (H) 0.350 - 4.500 uIU/mL  CBC     Status: Abnormal   Collection Time: 01/18/16  4:43 AM  Result Value Ref Range   WBC 5.1 4.0 - 10.5 K/uL   RBC 3.22 (L) 4.22 - 5.81 MIL/uL   Hemoglobin 10.9 (L) 13.0 - 17.0 g/dL   HCT 31.9 (L) 39.0 - 52.0 %   MCV 99.1 78.0 - 100.0 fL   MCH 33.9 26.0 - 34.0 pg   MCHC 34.2 30.0 - 36.0 g/dL   RDW 13.0 11.5 - 15.5 %   Platelets 189 150 - 400 K/uL  Comprehensive metabolic panel     Status: Abnormal   Collection Time: 01/18/16  4:43 AM  Result Value Ref Range   Sodium 132 (L) 135 - 145 mmol/L   Potassium 3.6 3.5 - 5.1 mmol/L   Chloride 96 (L) 101 - 111 mmol/L   CO2 28 22 - 32 mmol/L   Glucose, Bld 91 65  - 99 mg/dL   BUN 29 (H) 6 - 20 mg/dL   Creatinine, Ser 0.98 0.61 - 1.24 mg/dL   Calcium 8.3 (L) 8.9 - 10.3 mg/dL   Total Protein 6.2 (L) 6.5 - 8.1 g/dL   Albumin 3.3 (L) 3.5 - 5.0 g/dL   AST 26 15 - 41 U/L   ALT 23 17 - 63 U/L   Alkaline Phosphatase 92 38 - 126 U/L   Total Bilirubin 0.6 0.3 - 1.2 mg/dL   GFR calc non Af Amer >60 >60 mL/min   GFR calc Af Amer >60 >60 mL/min    Comment: (NOTE) The eGFR has been calculated using the CKD EPI equation. This calculation has not been validated in all clinical situations. eGFR's persistently <60 mL/min signify possible Chronic Kidney Disease.    Anion gap 8 5 - 15  Cortisol     Status: None   Collection Time: 01/18/16  4:43 AM  Result Value Ref Range   Cortisol, Plasma 12.7 ug/dL    Comment: (NOTE) AM    6.7 - 22.6 ug/dL PM   <10.0       ug/dL Performed at [facility]   Osmolality     Status: None   Collection Time: 01/18/16  4:43 AM  Result Value Ref Range   Osmolality 283 275 - 295 mOsm/kg    Comment: Performed at [facility]  Sodium, urine, random     Status: None   Collection Time: 01/18/16  4:43 AM  Result Value Ref Range   Sodium, Ur 113 mmol/L  Troponin I     Status: Abnormal   Collection Time: 01/19/16  5:12 AM  Result Value Ref Range   Troponin I 0.04 (H) <0.031 ng/mL    Comment:        PERSISTENTLY INCREASED TROPONIN VALUES IN THE RANGE OF 0.04-0.49 ng/mL CAN BE SEEN IN:       -UNSTABLE ANGINA       -CONGESTIVE HEART FAILURE       -MYOCARDITIS       -CHEST TRAUMA       -  ARRYHTHMIAS       -LATE PRESENTING MYOCARDIAL INFARCTION       -COPD   CLINICAL FOLLOW-UP RECOMMENDED.     ABGS No results for input(s): PHART, PO2ART, TCO2, HCO3 in the last 72 hours.  Invalid input(s): PCO2 CULTURES No results found for this or any previous visit (from the past 240 hour(s)). Studies/Results: Dg Chest 2 View  01/18/2016  CLINICAL DATA:  82 year old male with shortness of breath EXAM: CHEST  2 VIEW  COMPARISON:  Radiograph dated 01/05/2016 and CT dated 05/31/2015 FINDINGS: There is mild diffuse interstitial coarsening may represent a degree of edema or congestive changes. There is stable eventration of the left hemidiaphragm. Minimal bibasilar atelectatic changes. There is no focal consolidation. No pleural effusion or pneumothorax. Stable cardiomegaly. Left pectoral pacemaker device. No acute osseous pathology. IMPRESSION: Probable mild edema versus congestion.  No focal consolidation. Electronically Signed   By: Anner Crete M.D.   On: 01/18/2016 00:02    Medications:  Prior to Admission:  Prescriptions prior to admission  Medication Sig Dispense Refill Last Dose  . Ascorbic Acid (VITAMIN C PO) Take 1 tablet by mouth daily.   01/17/2016 at Unknown time  . aspirin EC 81 MG tablet Take 81 mg by mouth daily.   01/17/2016 at Unknown time  . atorvastatin (LIPITOR) 10 MG tablet Take 10 mg by mouth daily.   01/17/2016 at Unknown time  . B Complex-C (B-COMPLEX WITH VITAMIN C) tablet Take 1 tablet by mouth daily.   Past Week at Unknown time  . Cholecalciferol (VITAMIN D PO) Take 1 tablet by mouth daily.   01/17/2016 at Unknown time  . Coenzyme Q10 (CO Q 10 PO) Take 1 tablet by mouth daily.    01/17/2016 at Unknown time  . Cyanocobalamin (VITAMIN B-12 PO) Take 1 tablet by mouth daily.   01/17/2016 at Unknown time  . diclofenac sodium (VOLTAREN) 1 % GEL Apply 2 g topically daily as needed (pain).   01/17/2016 at Unknown time  . finasteride (PROSCAR) 5 MG tablet Take 5 mg by mouth at bedtime.   01/17/2016 at Unknown time  . guaifenesin (MUCUS RELIEF) 400 MG TABS tablet Take 400 mg by mouth every 4 (four) hours as needed (for mucus).   01/17/2016 at Unknown time  . HYDROcodone-acetaminophen (NORCO/VICODIN) 5-325 MG tablet Take 2 tablets by mouth every 4 (four) hours as needed. 10 tablet 0 01/17/2016 at Unknown time  . lactose free nutrition (BOOST) LIQD Take 237 mLs by mouth daily.    Past Week at Unknown time   . levothyroxine (SYNTHROID, LEVOTHROID) 175 MCG tablet Take 137 mcg by mouth every evening.    01/17/2016 at Unknown time  . magnesium oxide (MAG-OX) 400 MG tablet Take 400 mg by mouth 2 (two) times daily.   01/17/2016 at Unknown time  . Melatonin 10 MG CAPS Take 1 capsule by mouth at bedtime.    01/17/2016 at Unknown time  . pantoprazole (PROTONIX) 40 MG tablet Take 40 mg by mouth 2 (two) times daily.   01/17/2016 at Unknown time  . Resveratrol 250 MG CAPS Take 1 capsule by mouth 2 (two) times daily.   01/17/2016 at Unknown time  . ropinirole (REQUIP) 5 MG tablet Take 5 mg by mouth at bedtime.   01/17/2016 at Unknown time  . Tamsulosin HCl (FLOMAX) 0.4 MG CAPS Take 0.4 mg by mouth 2 (two) times daily.    01/17/2016 at Unknown time  . torsemide (DEMADEX) 20 MG tablet Take 1 tablet by  mouth daily.   01/17/2016 at Unknown time   Scheduled: . antiseptic oral rinse  7 mL Mouth Rinse q12n4p  . aspirin EC  81 mg Oral Daily  . atorvastatin  10 mg Oral Daily  . chlorhexidine  15 mL Mouth Rinse BID  . enoxaparin (LOVENOX) injection  40 mg Subcutaneous Q24H  . feeding supplement  1 Container Oral BID BM  . finasteride  5 mg Oral QHS  . furosemide  40 mg Intravenous Daily  . levothyroxine  137 mcg Oral QPM  . magnesium oxide  400 mg Oral BID  . pantoprazole  40 mg Oral BID  . ropinirole  5 mg Oral QHS  . sacubitril-valsartan  1 tablet Oral BID  . sodium chloride flush  3 mL Intravenous Q12H  . sodium chloride flush  3 mL Intravenous Q12H  . tamsulosin  0.4 mg Oral BID   Continuous:  MGN:OIBBCW chloride, diclofenac sodium, guaiFENesin, sodium chloride flush  Assesment: He has heart failure which is improving. His echocardiogram showed severe systolic dysfunction and some question of restrictive physiology.  He has protein calorie malnutrition which also increases his edema. Active Problems:   Hypothyroidism   Hyponatremia   CHF exacerbation (HCC)   Anemia   Shortness of breath   Edema   Protein  calorie malnutrition ([place])    Plan: Continue current treatments. I will request cardiology consultation.      Teneka Malmberg L 01/19/2016, 10:24 AM

## 2016-01-19 NOTE — Progress Notes (Signed)
Pt had no complaints. Requested trach be cleaned out. RT aware. Pt resting well and denies pain.

## 2016-01-20 DIAGNOSIS — Z823 Family history of stroke: Secondary | ICD-10-CM | POA: Diagnosis not present

## 2016-01-20 DIAGNOSIS — Z96653 Presence of artificial knee joint, bilateral: Secondary | ICD-10-CM | POA: Diagnosis present

## 2016-01-20 DIAGNOSIS — Z8673 Personal history of transient ischemic attack (TIA), and cerebral infarction without residual deficits: Secondary | ICD-10-CM | POA: Diagnosis not present

## 2016-01-20 DIAGNOSIS — Z8521 Personal history of malignant neoplasm of larynx: Secondary | ICD-10-CM | POA: Diagnosis not present

## 2016-01-20 DIAGNOSIS — E871 Hypo-osmolality and hyponatremia: Secondary | ICD-10-CM | POA: Diagnosis present

## 2016-01-20 DIAGNOSIS — I350 Nonrheumatic aortic (valve) stenosis: Secondary | ICD-10-CM | POA: Diagnosis present

## 2016-01-20 DIAGNOSIS — E46 Unspecified protein-calorie malnutrition: Secondary | ICD-10-CM | POA: Diagnosis present

## 2016-01-20 DIAGNOSIS — Z87891 Personal history of nicotine dependence: Secondary | ICD-10-CM | POA: Diagnosis not present

## 2016-01-20 DIAGNOSIS — R609 Edema, unspecified: Secondary | ICD-10-CM | POA: Diagnosis present

## 2016-01-20 DIAGNOSIS — Z833 Family history of diabetes mellitus: Secondary | ICD-10-CM | POA: Diagnosis not present

## 2016-01-20 DIAGNOSIS — Z7982 Long term (current) use of aspirin: Secondary | ICD-10-CM | POA: Diagnosis not present

## 2016-01-20 DIAGNOSIS — Z930 Tracheostomy status: Secondary | ICD-10-CM | POA: Diagnosis not present

## 2016-01-20 DIAGNOSIS — E039 Hypothyroidism, unspecified: Secondary | ICD-10-CM | POA: Diagnosis present

## 2016-01-20 DIAGNOSIS — K219 Gastro-esophageal reflux disease without esophagitis: Secondary | ICD-10-CM | POA: Diagnosis present

## 2016-01-20 DIAGNOSIS — Z950 Presence of cardiac pacemaker: Secondary | ICD-10-CM | POA: Diagnosis not present

## 2016-01-20 DIAGNOSIS — D649 Anemia, unspecified: Secondary | ICD-10-CM | POA: Diagnosis present

## 2016-01-20 DIAGNOSIS — I739 Peripheral vascular disease, unspecified: Secondary | ICD-10-CM | POA: Diagnosis present

## 2016-01-20 DIAGNOSIS — I359 Nonrheumatic aortic valve disorder, unspecified: Secondary | ICD-10-CM | POA: Diagnosis not present

## 2016-01-20 DIAGNOSIS — R0602 Shortness of breath: Secondary | ICD-10-CM | POA: Diagnosis not present

## 2016-01-20 DIAGNOSIS — Z806 Family history of leukemia: Secondary | ICD-10-CM | POA: Diagnosis not present

## 2016-01-20 DIAGNOSIS — Z85819 Personal history of malignant neoplasm of unspecified site of lip, oral cavity, and pharynx: Secondary | ICD-10-CM | POA: Diagnosis not present

## 2016-01-20 DIAGNOSIS — E876 Hypokalemia: Secondary | ICD-10-CM | POA: Diagnosis present

## 2016-01-20 DIAGNOSIS — I5023 Acute on chronic systolic (congestive) heart failure: Secondary | ICD-10-CM | POA: Diagnosis not present

## 2016-01-20 DIAGNOSIS — I5043 Acute on chronic combined systolic (congestive) and diastolic (congestive) heart failure: Secondary | ICD-10-CM | POA: Diagnosis present

## 2016-01-20 DIAGNOSIS — Z85828 Personal history of other malignant neoplasm of skin: Secondary | ICD-10-CM | POA: Diagnosis not present

## 2016-01-20 DIAGNOSIS — E785 Hyperlipidemia, unspecified: Secondary | ICD-10-CM | POA: Diagnosis present

## 2016-01-20 DIAGNOSIS — I2510 Atherosclerotic heart disease of native coronary artery without angina pectoris: Secondary | ICD-10-CM | POA: Diagnosis present

## 2016-01-20 LAB — BASIC METABOLIC PANEL
Anion gap: 7 (ref 5–15)
BUN: 22 mg/dL — AB (ref 6–20)
CHLORIDE: 93 mmol/L — AB (ref 101–111)
CO2: 26 mmol/L (ref 22–32)
CREATININE: 0.7 mg/dL (ref 0.61–1.24)
Calcium: 8.3 mg/dL — ABNORMAL LOW (ref 8.9–10.3)
GFR calc Af Amer: >60 mL/min (ref >60)
GFR calc non Af Amer: >60 mL/min (ref >60)
Glucose, Bld: 115 mg/dL — ABNORMAL HIGH (ref 65–99)
Potassium: 3.6 mmol/L (ref 3.5–5.1)
SODIUM: 126 mmol/L — AB (ref 135–145)

## 2016-01-20 NOTE — Progress Notes (Signed)
Subjective: He gestures that he feels better. He has no new complaints.  Objective: Vital signs in last 24 hours: Temp:  [97.9 F (36.6 C)-98.9 F (37.2 C)] 98 F (36.7 C) (05/14 [id number]) Pulse Rate:  [62-75] 62 (05/14 0637) Resp:  [16] 16 (05/14 0637) BP: (81-118)/(49-60) 81/59 mmHg (05/14 0637) SpO2:  [91 %-96 %] 96 % (05/14 0637) FiO2 (%):  [28 %] 28 % (05/13 2100) Weight:  [66.5 kg (146 lb 9.7 oz)] 66.5 kg (146 lb 9.7 oz) (05/14 [id number]) Weight change:  Last BM Date: 01/19/16  Intake/Output from previous day: 05/13 0701 - 05/14 0700 In: 720 [P.O.:720] Out: 300 [Urine:300]  PHYSICAL EXAM General appearance: alert, cooperative and mild distress Resp: clear to auscultation bilaterally Cardio: irregularly irregular rhythm GI: His abdomen is soft bowel sounds are present Extremities: Edema of his legs is essentially gone  Lab Results:  Results for orders placed or performed during the hospital encounter of 01/17/16 (from the past 48 hour(s))  Troponin I     Status: Abnormal   Collection Time: 01/19/16  5:12 AM  Result Value Ref Range   Troponin I 0.04 (H) <0.031 ng/mL    Comment:        PERSISTENTLY INCREASED TROPONIN VALUES IN THE RANGE OF 0.04-0.49 ng/mL CAN BE SEEN IN:       -UNSTABLE ANGINA       -CONGESTIVE HEART FAILURE       -MYOCARDITIS       -CHEST TRAUMA       -ARRYHTHMIAS       -LATE PRESENTING MYOCARDIAL INFARCTION       -COPD   CLINICAL FOLLOW-UP RECOMMENDED.   Troponin I     Status: None   Collection Time: 01/19/16 10:53 AM  Result Value Ref Range   Troponin I 0.03 <0.031 ng/mL    Comment:        NO INDICATION OF MYOCARDIAL INJURY.   Troponin I     Status: None   Collection Time: 01/19/16  5:03 PM  Result Value Ref Range   Troponin I 0.03 <0.031 ng/mL    Comment:        NO INDICATION OF MYOCARDIAL INJURY.     ABGS No results for input(s): PHART, PO2ART, TCO2, HCO3 in the last 72 hours.  Invalid input(s): PCO2 CULTURES No results found  for this or any previous visit (from the past 240 hour(s)). Studies/Results: No results found.  Medications:  Prior to Admission:  Prescriptions prior to admission  Medication Sig Dispense Refill Last Dose  . Ascorbic Acid (VITAMIN C PO) Take 1 tablet by mouth daily.   01/17/2016 at Unknown time  . aspirin EC 81 MG tablet Take 81 mg by mouth daily.   01/17/2016 at Unknown time  . atorvastatin (LIPITOR) 10 MG tablet Take 10 mg by mouth daily.   01/17/2016 at Unknown time  . B Complex-C (B-COMPLEX WITH VITAMIN C) tablet Take 1 tablet by mouth daily.   Past Week at Unknown time  . Cholecalciferol (VITAMIN D PO) Take 1 tablet by mouth daily.   01/17/2016 at Unknown time  . Coenzyme Q10 (CO Q 10 PO) Take 1 tablet by mouth daily.    01/17/2016 at Unknown time  . Cyanocobalamin (VITAMIN B-12 PO) Take 1 tablet by mouth daily.   01/17/2016 at Unknown time  . diclofenac sodium (VOLTAREN) 1 % GEL Apply 2 g topically daily as needed (pain).   01/17/2016 at Unknown time  . finasteride (PROSCAR) 5 MG tablet  Take 5 mg by mouth at bedtime.   01/17/2016 at Unknown time  . guaifenesin (MUCUS RELIEF) 400 MG TABS tablet Take 400 mg by mouth every 4 (four) hours as needed (for mucus).   01/17/2016 at Unknown time  . HYDROcodone-acetaminophen (NORCO/VICODIN) 5-325 MG tablet Take 2 tablets by mouth every 4 (four) hours as needed. 10 tablet 0 01/17/2016 at Unknown time  . lactose free nutrition (BOOST) LIQD Take 237 mLs by mouth daily.    Past Week at Unknown time  . levothyroxine (SYNTHROID, LEVOTHROID) 175 MCG tablet Take 137 mcg by mouth every evening.    01/17/2016 at Unknown time  . magnesium oxide (MAG-OX) 400 MG tablet Take 400 mg by mouth 2 (two) times daily.   01/17/2016 at Unknown time  . Melatonin 10 MG CAPS Take 1 capsule by mouth at bedtime.    01/17/2016 at Unknown time  . pantoprazole (PROTONIX) 40 MG tablet Take 40 mg by mouth 2 (two) times daily.   01/17/2016 at Unknown time  . Resveratrol 250 MG CAPS Take 1  capsule by mouth 2 (two) times daily.   01/17/2016 at Unknown time  . ropinirole (REQUIP) 5 MG tablet Take 5 mg by mouth at bedtime.   01/17/2016 at Unknown time  . Tamsulosin HCl (FLOMAX) 0.4 MG CAPS Take 0.4 mg by mouth 2 (two) times daily.    01/17/2016 at Unknown time  . torsemide (DEMADEX) 20 MG tablet Take 1 tablet by mouth daily.   01/17/2016 at Unknown time   Scheduled: . antiseptic oral rinse  7 mL Mouth Rinse q12n4p  . aspirin EC  81 mg Oral Daily  . atorvastatin  10 mg Oral Daily  . chlorhexidine  15 mL Mouth Rinse BID  . enoxaparin (LOVENOX) injection  40 mg Subcutaneous Q24H  . feeding supplement  1 Container Oral BID BM  . finasteride  5 mg Oral QHS  . furosemide  40 mg Intravenous Daily  . levothyroxine  137 mcg Oral QPM  . magnesium oxide  400 mg Oral BID  . pantoprazole  40 mg Oral BID  . ropinirole  5 mg Oral QHS  . sacubitril-valsartan  1 tablet Oral BID  . sodium chloride flush  3 mL Intravenous Q12H  . sodium chloride flush  3 mL Intravenous Q12H  . tamsulosin  0.4 mg Oral BID   Continuous:  [id number] chloride, diclofenac sodium, guaiFENesin, sodium chloride flush  Assesment: He was admitted with CHF exacerbation and protein calorie malnutrition. He is improving. He is negative about 2 L and his edema looks better. He looks more comfortable. There is some question of restrictive physiology on his echocardiogram and cardiology consultation has been requested Principal Problem:   Acute on chronic systolic heart failure (HCC) Active Problems:   Hypothyroidism   Hyponatremia   CHF exacerbation (HCC)   Anemia   Shortness of breath   Edema   Protein calorie malnutrition ([place])    Plan: No change in treatments. Check renal function today. Cardiology consultation.      Meagan Spease L 01/20/2016, 9:59 AM

## 2016-01-20 NOTE — Progress Notes (Signed)
Patient has thick mucus secretion drying suctioned and removed. Patient doesn't like trach collar aerosol only wears occasionally

## 2016-01-21 DIAGNOSIS — I5043 Acute on chronic combined systolic (congestive) and diastolic (congestive) heart failure: Principal | ICD-10-CM

## 2016-01-21 DIAGNOSIS — I519 Heart disease, unspecified: Secondary | ICD-10-CM

## 2016-01-21 DIAGNOSIS — E785 Hyperlipidemia, unspecified: Secondary | ICD-10-CM

## 2016-01-21 DIAGNOSIS — Z950 Presence of cardiac pacemaker: Secondary | ICD-10-CM

## 2016-01-21 DIAGNOSIS — I359 Nonrheumatic aortic valve disorder, unspecified: Secondary | ICD-10-CM

## 2016-01-21 LAB — BASIC METABOLIC PANEL
ANION GAP: 7 (ref 5–15)
BUN: 22 mg/dL — ABNORMAL HIGH (ref 6–20)
CALCIUM: 8.1 mg/dL — AB (ref 8.9–10.3)
CO2: 27 mmol/L (ref 22–32)
CREATININE: 0.62 mg/dL (ref 0.61–1.24)
Chloride: 92 mmol/L — ABNORMAL LOW (ref 101–111)
Glucose, Bld: 105 mg/dL — ABNORMAL HIGH (ref 65–99)
Potassium: 3.4 mmol/L — ABNORMAL LOW (ref 3.5–5.1)
SODIUM: 126 mmol/L — AB (ref 135–145)

## 2016-01-21 MED ORDER — POTASSIUM CHLORIDE CRYS ER 20 MEQ PO TBCR
20.0000 meq | EXTENDED_RELEASE_TABLET | Freq: Three times a day (TID) | ORAL | Status: AC
  Administered 2016-01-21 – 2016-01-22 (×6): 20 meq via ORAL
  Filled 2016-01-21 (×6): qty 1

## 2016-01-21 NOTE — Consult Note (Addendum)
CARDIOLOGY CONSULT NOTE   Patient ID: Andre Holder MRN: [id number] DOB/AGE: [birth year] 82 y.o.  Admit Date: 01/17/2016 Referring Physician: Asencion Noble MD Primary Physician: Asencion Noble, MD Consulting Cardiologist: Kate Sable MD Primary Cardiologist Cristopher Peru MD Reason for Consultation: CHF and recommendations on medical management.   Clinical Summary Andre Holder is a 82 y.o.male who is medically complicated, with history of CHF, NICM, with EF of 20-25%, Mobitz type II with pacemaker implantation, CVA, peripheral vascular disease, bilateral carotid stenosis, among multiple other medical issues, who was admitted due to decompensated heart failure with lower extremity edema, dyspnea. The patient spoke to me through an amplified microphone as he has had cancer of the throat and larynx and is unable to speak normally. He also has a tracheostomy.  Arrival to the emergency room the patient's blood pressure was 108/60, heart rate 71 O2 sat 94% he was afebrile. Mildly anemic with a hemoglobin 11.5 hematocrit of 33.4. Hyponatremic with sodium 131, chloride 97, BUN 32, crit 1.0. BNP elevated at 1710. Chest x-ray revealed probable mild edema versus congestion. He was given aspirin 324 mg and IV Lasix 40 mg in the ER. He has been continued on Lasix 40 mg IV daily, and started on Entresto 24-26 by hospitalist service. Since admission he is diuresed 2670 cc. EKG revealed paced rhythm.  He states he is feeling some better but still is short of breath. His oxygen had fallen off of his trach site. He denies chest pain.  No Known Allergies  Medications Scheduled Medications: . antiseptic oral rinse  7 mL Mouth Rinse q12n4p  . aspirin EC  81 mg Oral Daily  . atorvastatin  10 mg Oral Daily  . chlorhexidine  15 mL Mouth Rinse BID  . enoxaparin (LOVENOX) injection  40 mg Subcutaneous Q24H  . feeding supplement  1 Container Oral BID BM  . finasteride  5 mg Oral QHS  . furosemide  40 mg  Intravenous Daily  . levothyroxine  137 mcg Oral QPM  . magnesium oxide  400 mg Oral BID  . pantoprazole  40 mg Oral BID  . potassium chloride  20 mEq Oral TID  . ropinirole  5 mg Oral QHS  . sacubitril-valsartan  1 tablet Oral BID  . sodium chloride flush  3 mL Intravenous Q12H  . sodium chloride flush  3 mL Intravenous Q12H  . tamsulosin  0.4 mg Oral BID       PRN Medications: sodium chloride, diclofenac sodium, guaiFENesin, sodium chloride flush   Past Medical History  Diagnosis Date  . Arteriosclerotic cardiovascular disease (ASCVD)     Nonobstructive; 09/2008 50% proximal and 40% mid LAD; 25% circumflex; 30% RCA; mild global LV dysfunction with EF of 45%. No aortic stenosis.  . Mild aortic stenosis     not documented at catheterization; verified by echo in 2011  . Peripheral vascular disease ([place])     With a 70% innominate artery stenosis and nonobstructive carotid stenosis  . Hypothyroidism   . Degenerative joint disease     s/p bilateral TKR  . Mobitz (type) II atrioventricular block     With bradycardia; Medtronic pacemaker implanted in 09/2008  . Tobacco abuse, in remission     Remote  . GERD (gastroesophageal reflux disease)   . Hyperlipidemia     Lipid profile in 04/2010:115, 98, 43, 52.  . Weight loss     50 pounds between 1991 and 2011  . 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 Adrenal hyperplasia ([place])     Stable on serial imaging  . Anemia     minimal in 2011 with hemoglobin of 12.2 and high normal MCV  . Borderline hypertension     Normal CMet in 2011  . Cancer of larynx ([place])     laryngectomy in 1988; postoperative radiation therapy  . Skin cancer   . Carotid stenosis   . Mitral regurgitation     Past Surgical History  Procedure Laterality Date  . Laryngectomy  1988    S/P laryngectomy and radiation therapy  . Appendectomy  1973  . Knee arthroscopy      Left  . Total knee arthroplasty      Bilateral,  19 years ago  . Cataract extraction, bilateral    . Decompression facial nerve      Right median  . Pacemaker insertion    . Insert / replace / remove pacemaker      Family History  Problem Relation Age of Onset  . Stroke Mother   . Leukemia Father   . Colon cancer Neg Hx   . Liver disease Neg Hx   . GI problems Neg Hx   . Stroke Other   . Diabetes Other     Social History Andre Holder reports that he has quit smoking. His smoking use included Cigarettes. He quit smokeless tobacco use about 33 years ago. Andre Holder reports that he does not drink alcohol.  Review of Systems Complete review of systems are found to be negative unless outlined in H&P above.  Physical Examination Blood pressure 121/57, pulse 64, temperature 98.3 F (36.8 C), temperature source Oral, resp. rate 16, height 6\' 1"  (1.854 m), weight 146 lb 9.7 oz (66.5 kg), SpO2 90 %.  Intake/Output Summary (Last 24 hours) at 01/21/16 0900 Last data filed at 01/20/16 1857  Gross per 24 hour  Intake    240 ml  Output   1000 ml  Net   -760 ml    Telemetry:Paced rhythm   GEN: Frail appearing  HEENT: Conjunctiva and lids normal, oropharynx clear with moist mucosa. Neck: Supple, no elevated JVP or carotid bruits, no thyromegaly. Lungs: Clear to auscultation, nonlabored breathing at rest.Tracheostomy.  Cardiac: Regular rate and rhythm, distant no S3 or significant systolic murmur, no pericardial rub. Abdomen: Soft, nontender, no hepatomegaly, bowel sounds present, no guarding or rebound. Extremities: No pitting edema, distal pulses 1+. Skin: Warm and dry. Musculoskeletal: No kyphosis. Neuropsychiatric: Alert and oriented x3, affect grossly appropriate.  Prior Cardiac Testing/Procedures 1. Echocardiogram 01/18/2016 Left ventricle: The cavity size was normal. Wall thickness was  increased in a pattern of mild LVH. Systolic function was  severely reduced. The estimated ejection fraction was in the  range of 20%  to 25%. Diffuse hypokinesis. Doppler parameters are  consistent with restrictive physiology, indicative of decreased  left ventricular diastolic compliance and/or increased left  atrial pressure. - Aortic valve: Severely calcified annulus. Severely thickened  leaflets. There was mild stenosis. There was moderate  regurgitation. Mean gradient (S): 7 mm Hg. Valve area (VTI): 1.89  cm^2. Valve area (Vmax): 2.16 cm^2. Valve area (Vmean): 2.16  cm^2. Regurgitation pressure half-time: 425 ms. - Mitral valve: Mildly calcified annulus. Normal thickness leaflets  . There was moderate regurgitation. - Left atrium: The atrium was severely dilated. - Right ventricle: The cavity size was mildly dilated. - Right atrium: The atrium was severely dilated. - Tricuspid valve: There was mild-moderate regurgitation. - Pulmonary  arteries: Systolic pressure was moderately increased.  PA peak pressure: 55 mm Hg (S). - Technically adequate study.  Pacemaker 09/2008 PPM Vendor: Medtronic PPM Model Number: [id number] PPM Serial Number: [id number] PPM DOI: 09/27/2008 PPM Implanting MD: Thompson Grayer, MD  Cardiac Cath  The left main coronary artery: The left main coronary artery was free of significant disease.  Left anterior descending: The left anterior descending artery gave rise to two diagonal branches and three septal perforators. There was 40% narrowing in the proximal LAD. The rest of the vessel was free of significant disease.  Circumflex artery: The circumflex artery gave rise to two small marginal branches, an atrial branch, and three posterolateral branches. These vessels were free of significant disease.  Right coronary artery: The right coronary is a moderate sized vessel that gave rise to a conus branch, right ventricular branch, a posterior descending branch and three posterolateral branches. These vessels were free of significant disease.  LEFT  VENTRICULOGRAPHY: The left ventriculogram was performed in the RAO projection showed good wall motion with no area of hypokinesis. The estimated ejection fraction is 50%.  CONCLUSIONS: Nonobstructive coronary artery disease with good left ventricular function.  Lab Results  Basic Metabolic Panel:  Recent Labs Lab 01/14/16 1407 01/17/16 2311 01/18/16 0443 01/20/16 1028 01/21/16 0440  NA 130* 131* 132* 126* 126*  K 4.6 4.0 3.6 3.6 3.4*  CL 96* 97* 96* 93* 92*  CO2 26 26 28 26 27   GLUCOSE 80 98 91 115* 105*  BUN 28* 32* 29* 22* 22*  CREATININE 0.87 1.00 0.98 0.70 0.62  CALCIUM 8.8* 8.1* 8.3* 8.3* 8.1*    Liver Function Tests:  Recent Labs Lab 01/14/16 1407 01/17/16 2311 01/18/16 0443  AST 29 28 26   ALT 24 23 23   ALKPHOS 96 99 92  BILITOT 0.6 0.6 0.6  PROT 6.3* 6.2* 6.2*  ALBUMIN 3.3* 3.3* 3.3*    CBC:  Recent Labs Lab 01/14/16 1407 01/17/16 2311 01/18/16 0443  WBC 5.2 5.4 5.1  NEUTROABS 3.6 3.9  --   HGB 11.2* 11.5* 10.9*  HCT 32.7* 33.4* 31.9*  MCV 100.3* 99.1 99.1  PLT 182 184 189    Cardiac Enzymes:  Recent Labs Lab 01/17/16 2311 01/19/16 0512 01/19/16 1053 01/19/16 1703  TROPONINI 0.03 0.04* 0.03 0.03    BNP:1.710   Radiology: 01/17/2016 FINDINGS: There is mild diffuse interstitial coarsening may represent a degree of edema or congestive changes. There is stable eventration of the left hemidiaphragm. Minimal bibasilar atelectatic changes. There is no focal consolidation. No pleural effusion or pneumothorax. Stable cardiomegaly. Left pectoral pacemaker device. No acute osseous pathology.  IMPRESSION: Probable mild edema versus congestion. No focal consolidation.   ECG: Paced Rhythm    Impression and Recommendations 1.Acute on Chronic Mixed Systolic and Diastolic CHF: He has diuresed 2670 cc, but continues to have complaints of dyspnea, he does have bilateral crackles in the bases but no wheezes. Would continue IV diuresis  Creatinine is 0.62 with CO2 27.  Would continue for another 24 hours or so as he does not appear to be euvolemic.   Agree with [name], as kidney function is adequate and his BP is stable. With his age, however, will discuss with Dr. Bronson Ing about long term use. Will replace potassium.   2. AoV stenosis; Continue afterload reduction, with Enteresto, as OP he was not on ACE or ARB. .  3. Hyperlipidemia: Continue statin.   4. PPM in situ: Followed by Dr. Lovena Le for remote checks and annual visits.  Last interrogation, however appears that he has not has this completed since 2013. May need to interrogate prior to discharge.      Signed: Phill Myron. Lawrence NP [place]  01/21/2016, 9:00 AM Co-Sign MD  The patient was seen and examined, and I agree with the history, physical exam, assessment and plan as documented above which has been discussed with Arnold Long NP, with modifications as noted below. Pt admitted with acute on chronic systolic heart failure. Still has mild chest pressure and shortness of breath.  Has nonischemic cardiomyopathy. Has not seen EP (Dr. Lovena Le) since 0000000 and I am not certain when last pacemaker interrogation was. I last saw him in hospital consultation in 2014. Agree with low-dose Entresto and continued IV diuresis for one more day. I think pacemaker needs to be interrogated in near future.  Kate Sable, MD, [facility]  01/21/2016 10:26 AM

## 2016-01-21 NOTE — Progress Notes (Signed)
Subjective: His breathing has improved. Oxygen saturations are in the 90s on room air.  Objective: Vital signs in last 24 hours: Filed Vitals:   01/20/16 1432 01/20/16 2207 01/20/16 2349 01/21/16 0518  BP: 105/56 109/53  121/57  Pulse: 65 64 68 64  Temp: 98.6 F (37 C) 97.5 F (36.4 C)  98.3 F (36.8 C)  TempSrc: Oral Oral  Oral  Resp: 16 16 16 16   Height:      Weight:      SpO2: 97% 94% 90% 90%   Weight change:   Intake/Output Summary (Last 24 hours) at 01/21/16 0747 Last data filed at 01/20/16 1857  Gross per 24 hour  Intake    480 ml  Output   1000 ml  Net   -520 ml    Physical Exam: Alert. He does not appear dyspneic. Lungs reveal mild rales. Heart regular with a grade 2 systolic murmur. Abdomen soft and nontender. Extremities reveal no edema.  Lab Results:    Results for orders placed or performed during the hospital encounter of 01/17/16 (from the past 24 hour(s))  Basic metabolic panel     Status: Abnormal   Collection Time: 01/20/16 10:28 AM  Result Value Ref Range   Sodium 126 (L) 135 - 145 mmol/L   Potassium 3.6 3.5 - 5.1 mmol/L   Chloride 93 (L) 101 - 111 mmol/L   CO2 26 22 - 32 mmol/L   Glucose, Bld 115 (H) 65 - 99 mg/dL   BUN 22 (H) 6 - 20 mg/dL   Creatinine, Ser 0.70 0.61 - 1.24 mg/dL   Calcium 8.3 (L) 8.9 - 10.3 mg/dL   GFR calc non Af Amer >60 >60 mL/min   GFR calc Af Amer >60 >60 mL/min   Anion gap 7 5 - 15  Basic metabolic panel     Status: Abnormal   Collection Time: 01/21/16  4:40 AM  Result Value Ref Range   Sodium 126 (L) 135 - 145 mmol/L   Potassium 3.4 (L) 3.5 - 5.1 mmol/L   Chloride 92 (L) 101 - 111 mmol/L   CO2 27 22 - 32 mmol/L   Glucose, Bld 105 (H) 65 - 99 mg/dL   BUN 22 (H) 6 - 20 mg/dL   Creatinine, Ser 0.62 0.61 - 1.24 mg/dL   Calcium 8.1 (L) 8.9 - 10.3 mg/dL   GFR calc non Af Amer >60 >60 mL/min   GFR calc Af Amer >60 >60 mL/min   Anion gap 7 5 - 15     ABGS No results for input(s): PHART, PO2ART, TCO2, HCO3 in the  last 72 hours.  Invalid input(s): PCO2 CULTURES No results found for this or any previous visit (from the past 240 hour(s)). Studies/Results: No results found. Micro Results: No results found for this or any previous visit (from the past 240 hour(s)). Studies/Results: No results found. Medications:  I have reviewed the patient's current medications Scheduled Meds: . antiseptic oral rinse  7 mL Mouth Rinse q12n4p  . aspirin EC  81 mg Oral Daily  . atorvastatin  10 mg Oral Daily  . chlorhexidine  15 mL Mouth Rinse BID  . enoxaparin (LOVENOX) injection  40 mg Subcutaneous Q24H  . feeding supplement  1 Container Oral BID BM  . finasteride  5 mg Oral QHS  . furosemide  40 mg Intravenous Daily  . levothyroxine  137 mcg Oral QPM  . magnesium oxide  400 mg Oral BID  . pantoprazole  40 mg Oral  BID  . ropinirole  5 mg Oral QHS  . sacubitril-valsartan  1 tablet Oral BID  . sodium chloride flush  3 mL Intravenous Q12H  . sodium chloride flush  3 mL Intravenous Q12H  . tamsulosin  0.4 mg Oral BID   Continuous Infusions:  PRN Meds:.sodium chloride, diclofenac sodium, guaiFENesin, sodium chloride flush   Assessment/Plan: #1. Acute on chronic systolic/diastolic heart failure. Improving. Continue intravenous Lasix. Consult cardiology. Interest though has been started. #2. Hypokalemia. 3.4. Supplement orally. #3. Hypothyroidism. Question recent compliance. Previously euthyroid on his current Synthroid dose. #4. Hyponatremia. Chronic. 126 today. #5. Mild aortic stenosis by recent echo. EF now 20%. Principal Problem:   Acute on chronic systolic heart failure (HCC) Active Problems:   Hypothyroidism   Hyponatremia   CHF exacerbation (HCC)   Anemia   Shortness of breath   Edema   Protein calorie malnutrition ([place])     LOS: 1 day   Lekeya Rollings 01/21/2016, 7:47 AM

## 2016-01-22 DIAGNOSIS — R0602 Shortness of breath: Secondary | ICD-10-CM

## 2016-01-22 DIAGNOSIS — E038 Other specified hypothyroidism: Secondary | ICD-10-CM

## 2016-01-22 DIAGNOSIS — E871 Hypo-osmolality and hyponatremia: Secondary | ICD-10-CM

## 2016-01-22 DIAGNOSIS — I5023 Acute on chronic systolic (congestive) heart failure: Secondary | ICD-10-CM

## 2016-01-22 LAB — BASIC METABOLIC PANEL
Anion gap: 9 (ref 5–15)
BUN: 24 mg/dL — AB (ref 6–20)
CO2: 26 mmol/L (ref 22–32)
CREATININE: 0.68 mg/dL (ref 0.61–1.24)
Calcium: 8.3 mg/dL — ABNORMAL LOW (ref 8.9–10.3)
Chloride: 93 mmol/L — ABNORMAL LOW (ref 101–111)
Glucose, Bld: 105 mg/dL — ABNORMAL HIGH (ref 65–99)
POTASSIUM: 4.2 mmol/L (ref 3.5–5.1)
SODIUM: 128 mmol/L — AB (ref 135–145)

## 2016-01-22 MED ORDER — TORSEMIDE 20 MG PO TABS: 20.0000 mg | ORAL_TABLET | Freq: Every day | ORAL | Status: DC

## 2016-01-22 MED ORDER — TORSEMIDE 20 MG PO TABS
20.0000 mg | ORAL_TABLET | Freq: Two times a day (BID) | ORAL | Status: DC
  Administered 2016-01-22 – 2016-01-23 (×3): 20 mg via ORAL
  Filled 2016-01-22 (×3): qty 1

## 2016-01-22 NOTE — Evaluation (Signed)
Physical Therapy Evaluation Patient Details Name: Andre Holder MRN: [id number] DOB: 1923-04-05 Today's Date: 01/22/2016   History of Present Illness  82 yo M admitted with B LE edema and dyspnea.  CXR with questionable vascular congestion.  Possible mild CHF.  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    Clinical Impression  Pt received in bed, agreeable to PT evaluation, but expressed need to use the restroom.  Pt states he normally lives with his wife in a 2 level home, but able to stay on the main level.  Pt states he is normally independent with ADL's and is still driving, but plans to give that up on his birthday.  Today, pt was able to perform sit<>stand transfers with RW and min guard, and ambulated 5ft with RW and CGA, but required several standing recovery periods, and demonstrated very slow cadence.  Pt would benefit from HHPT to ensure safety at home as well as improve strength, balance, and endurance.     Follow Up Recommendations Home health PT    Equipment Recommendations  None recommended by PT    Recommendations for Other Services       Precautions / Restrictions Precautions Precautions: Fall Restrictions Weight Bearing Restrictions: No      Mobility  Bed Mobility Overal bed mobility: Modified Independent                Transfers Overall transfer level: Needs assistance Equipment used: Rolling walker (2 wheeled) Transfers: Sit to/from Stand Sit to Stand: Min guard         General transfer comment: vc's for hand placement.  Family arrived at end of tx, and had briefs for incontinence, therefore donned at end of tx.   Ambulation/Gait Ambulation/Gait  assistance: Min guard Ambulation Distance (Feet): 80 Feet Assistive device: Rolling walker (2 wheeled) Gait Pattern/deviations: Step-to pattern;Decreased step length - right     General Gait Details: Pt ambulated to the restroom first, but was incontinent of urine.  Assisted pt with hygiene.  Pt demonstrates antalgic gait, and has difficulty with RW negotiation - pt uses a rollator at home.  Pt required several short standing recovery periods ~3-4, and demonstrates very slow cadence.   Stairs            Wheelchair Mobility    Modified Rankin (Stroke Patients Only)       Balance Overall balance assessment: Needs assistance Sitting-balance support: No upper extremity supported       Standing balance support: Bilateral upper extremity supported Standing balance-Leahy Scale: Fair                               Pertinent Vitals/Pain Pain Assessment: No/denies pain    Home Living Family/patient expects to be discharged to:: Private residence Living Arrangements: Spouse/significant other Available Help at Discharge: Family (Pt has grandchildren, and niece/nephews that assist when needed. ) Type of Home: House Home Access: Ramped entrance     Home Layout: Two level;Able to live on main level with bedroom/bathroom Home Equipment: Kasandra Knudsen - single point;Walker - 4 wheels      Prior Function Level of Independence: Independent with assistive device(s)         Comments:  Pt states that he was independent with dressing, bathing, and ambulates with a cane or his rollator.  Pt states that he was still driving, but plans to give it up at his birthday.      Hand Dominance        Extremity/Trunk Assessment   Upper Extremity Assessment: Generalized weakness           Lower Extremity Assessment: Generalized weakness      Cervical / Trunk Assessment: Kyphotic  Communication   Communication: Tracheostomy (uses an amplified microphone to communicate. )   Cognition Arousal/Alertness: Awake/alert Behavior During Therapy: WFL for tasks assessed/performed Overall Cognitive Status: Within Functional Limits for tasks assessed                      General Comments      Exercises        Assessment/Plan    PT Assessment Patient needs continued PT services  PT Diagnosis Difficulty walking;Generalized weakness   PT Problem List Decreased strength;Decreased activity tolerance;Decreased balance;Decreased mobility;Cardiopulmonary status limiting activity;Decreased safety awareness;Decreased knowledge of use of DME  PT Treatment Interventions DME instruction;Gait training;Functional mobility training;Therapeutic activities;Therapeutic exercise;Balance training;Patient/family education   PT Goals (Current goals can be found in the Care Plan section) Acute Rehab PT Goals Patient Stated Goal: Pt wants to go home.  PT Goal Formulation: With patient Time For Goal Achievement: 01/29/16 Potential to Achieve Goals: Good    Frequency Min 3X/week   Barriers to discharge Decreased caregiver support      Co-evaluation               End of Session Equipment Utilized During Treatment: Gait belt Activity Tolerance: Patient tolerated treatment well Patient left: in chair;with family/visitor present Nurse Communication: Mobility status    Functional Assessment Tool Used: [employer] AM-PAC "6-clicks"  Functional Limitation: Mobility: Walking and moving around Mobility: Walking and Moving Around Current Status [phone]): At least 40 percent but less than 60 percent impaired, limited or restricted Mobility: Walking and Moving Around Goal Status [phone]): At least 20 percent but less than 40 percent impaired, limited or restricted    Time: 0915-1004 PT Time Calculation (min) (ACUTE ONLY): 49 min   Charges:   PT Evaluation $PT Eval Low Complexity: 1 Procedure PT Treatments $Gait Training: 23-37 mins $Therapeutic Activity: 23-37  mins   PT G Codes:   PT G-Codes **NOT FOR INPATIENT CLASS** Functional Assessment Tool Used: [employer] AM-PAC "6-clicks"  Functional Limitation: Mobility: Walking and moving around Mobility: Walking and Moving Around Current Status [phone]): At least 40 percent but less than 60 percent impaired, limited or restricted Mobility: Walking and Moving Around Goal Status [phone]): At least 20 percent but less than 40 percent impaired, limited or restricted    Eustaquio Maize Sherryn Pollino, PT, DPT X: [id number]   01/22/2016, 10:21 AM

## 2016-01-22 NOTE — Progress Notes (Signed)
Patient had a 3 beat run of [facility] on telemetry, patient doesn't have any complaints, asymptomatic

## 2016-01-22 NOTE — Care Management Note (Signed)
Case Management Note  Patient Details  Name: Andre Holder MRN: [id number] Date of Birth: Nov 26, 1922   Expected Discharge Date:    01/23/2016              Expected Discharge Plan:  [place]  In-House Referral:  NA  Discharge planning Services  CM Consult  Post Acute Care Choice:  Home Health Choice offered to:  Patient, Spouse  DME Arranged:    DME Agency:     HH Arranged:  RN, PT [place] Agency:  [place]  Status of Service:  In process, will continue to follow  Medicare Important Message Given:    Date Medicare IM Given:    Medicare IM give by:    Date Additional Medicare IM Given:    Additional Medicare Important Message give by:     If discussed at [place] of Stay Meetings, dates discussed:    Additional Comments: Anticipate DC home on 01/23/2016. PT has recommended HH PT and pt/wife are agreeable. Pt has no DME needs. Pt's wife has chosen [facility] as [place] agency. Romualdo Bolk, of [facility], made aware of referral and will obtain pt info from chart. Pt is connected with [facility] and pt will need H & P and DC summary faxed at DC. Will cont to follow.   Sherald Barge, RN 01/22/2016, 3:29 PM

## 2016-01-22 NOTE — Progress Notes (Signed)
Pt had 6 beat run of VTACH on telemetry, asymptomatic.  VSS.  Pt currently asleep without any complaints.

## 2016-01-22 NOTE — Progress Notes (Signed)
Subjective: Rush Landmark feels better but does not feel back to his baseline. He continues to feel some shortness of breath. Appreciate cardiology consult.  Objective: Vital signs in last 24 hours: Filed Vitals:   01/21/16 1544 01/21/16 2028 01/21/16 2159 01/22/16 0458  BP: 106/54  117/68 120/58  Pulse: 69  66 72  Temp: 98.4 F (36.9 C)  98.1 F (36.7 C) 98.3 F (36.8 C)  TempSrc: Oral  Oral Oral  Resp: 18  20 18   Height:      Weight:    148 lb 6.6 oz (67.32 kg)  SpO2: 95% 91% 92% 96%   Weight change:   Intake/Output Summary (Last 24 hours) at 01/22/16 0746 Last data filed at 01/22/16 0459  Gross per 24 hour  Intake    240 ml  Output   1450 ml  Net  -1210 ml    Physical Exam: No distress. Lungs reveal basilar rales. Heart regular with a grade 2 systolic murmur. Abdomen soft and nontender. Extremities reveal no edema. Fluid balance is a -1210.  Lab Results:    Results for orders placed or performed during the hospital encounter of 01/17/16 (from the past 24 hour(s))  Basic metabolic panel     Status: Abnormal   Collection Time: 01/22/16  4:12 AM  Result Value Ref Range   Sodium 128 (L) 135 - 145 mmol/L   Potassium 4.2 3.5 - 5.1 mmol/L   Chloride 93 (L) 101 - 111 mmol/L   CO2 26 22 - 32 mmol/L   Glucose, Bld 105 (H) 65 - 99 mg/dL   BUN 24 (H) 6 - 20 mg/dL   Creatinine, Ser 0.68 0.61 - 1.24 mg/dL   Calcium 8.3 (L) 8.9 - 10.3 mg/dL   GFR calc non Af Amer >60 >60 mL/min   GFR calc Af Amer >60 >60 mL/min   Anion gap 9 5 - 15     ABGS No results for input(s): PHART, PO2ART, TCO2, HCO3 in the last 72 hours.  Invalid input(s): PCO2 CULTURES No results found for this or any previous visit (from the past 240 hour(s)). Studies/Results: No results found. Micro Results: No results found for this or any previous visit (from the past 240 hour(s)). Studies/Results: No results found. Medications:  I have reviewed the patient's current medications Scheduled Meds: . antiseptic  oral rinse  7 mL Mouth Rinse q12n4p  . aspirin EC  81 mg Oral Daily  . atorvastatin  10 mg Oral Daily  . chlorhexidine  15 mL Mouth Rinse BID  . enoxaparin (LOVENOX) injection  40 mg Subcutaneous Q24H  . feeding supplement  1 Container Oral BID BM  . finasteride  5 mg Oral QHS  . furosemide  40 mg Intravenous Daily  . levothyroxine  137 mcg Oral QPM  . magnesium oxide  400 mg Oral BID  . pantoprazole  40 mg Oral BID  . potassium chloride  20 mEq Oral TID  . ropinirole  5 mg Oral QHS  . sacubitril-valsartan  1 tablet Oral BID  . sodium chloride flush  3 mL Intravenous Q12H  . sodium chloride flush  3 mL Intravenous Q12H  . tamsulosin  0.4 mg Oral BID   Continuous Infusions:  PRN Meds:.sodium chloride, diclofenac sodium, guaiFENesin, sodium chloride flush   Assessment/Plan: #1. Acute on chronic systolic heart failure. Improved. Continue intravenous Lasix. Continue Entresto. Remove Foley catheter today. Will attempt ambulation with assistance. #2. Hypokalemia. Resolved with supplementation. Potassium is 4.2 #3. Hyponatremia. Stable. Slightly improved at  128. #4. Aortic stenosis. Principal Problem:   Acute on chronic systolic heart failure (HCC) Active Problems:   Hypothyroidism   Hyponatremia   CHF exacerbation (HCC)   Anemia   Shortness of breath   Edema   Protein calorie malnutrition ([place])     LOS: 2 days   Oberia Beaudoin 01/22/2016, 7:46 AM

## 2016-01-22 NOTE — Progress Notes (Addendum)
Pt Profile: 82 y.o.male who is medically complicated, with history of CHF, NICM, with EF of 20-25%, Mobitz type II with pacemaker implantation, CVA, peripheral vascular disease, bilateral carotid stenosis, among multiple other medical issues, who was admitted due to decompensated heart failure with lower extremity edema, dyspnea. The patient speaks through an amplified microphone as he has had cancer of the throat and larynx and is unable to speak normally. He also has a tracheostomy. --last cath 2001  Subjective: Feels better  Objective: Vital signs in last 24 hours: Temp:  [98.1 F (36.7 C)-98.4 F (36.9 C)] 98.3 F (36.8 C) (05/16 0458) Pulse Rate:  [66-72] 72 (05/16 0458) Resp:  [18-20] 18 (05/16 0458) BP: (106-120)/(54-68) 120/58 mmHg (05/16 0458) SpO2:  [91 %-96 %] 96 % (05/16 0458) FiO2 (%):  [21 %] 21 % (05/15 1200) Weight:  [148 lb 6.6 oz (67.32 kg)] 148 lb 6.6 oz (67.32 kg) (05/16 0458) Weight change:  Last BM Date: 01/21/16 Intake/Output from previous day:  -1210 05/15 0701 - 05/16 0700 In: 240 [P.O.:240] Out: 1450 [Urine:1450] Intake/Output this shift:    PE: General:Pleasant affect, NAD, talks with device, trach site stable Skin:Warm and dry, brisk capillary refill HEENT:normocephalic, sclera clear, mucus membranes moist Heart:S1S2 RRR with [id number] systolic murmur, no gallup, rub or click Lungs:clear with few rales, no rhonchi, or wheezes [id number], non tender, + BS, do not palpate liver spleen or masses Ext:no lower ext edema, 2+ pedal pulses, 2+ radial pulses Neuro:alert and oriented X 3, MAE, follows commands, + facial symmetry   Lab Results: No results for input(s): WBC, HGB, HCT, PLT in the last 72 hours. BMET  Recent Labs  01/21/16 0440 01/22/16 0412  NA 126* 128*  K 3.4* 4.2  CL 92* 93*  CO2 27 26  GLUCOSE 105* 105*  BUN 22* 24*  CREATININE 0.62 0.68  CALCIUM 8.1* 8.3*    Recent Labs  01/19/16 1053 01/19/16 1703  TROPONINI 0.03  0.03    Lab Results  Component Value Date   CHOL 115 04/09/2010   HDL 43 04/09/2010   LDLCALC 52 04/09/2010   TRIG 98 04/09/2010   CHOLHDL 2.7 Ratio 04/09/2010   No results found for: HGBA1C   Lab Results  Component Value Date   TSH 11.038* 01/18/2016       Studies/Results: 1. Echocardiogram 01/18/2016 Left ventricle: The cavity size was normal. Wall thickness was  increased in a pattern of mild LVH. Systolic function was  severely reduced. The estimated ejection fraction was in the  range of 20% to 25%. Diffuse hypokinesis. Doppler parameters are  consistent with restrictive physiology, indicative of decreased  left ventricular diastolic compliance and/or increased left  atrial pressure. - Aortic valve: Severely calcified annulus. Severely thickened  leaflets. There was mild stenosis. There was moderate  regurgitation. Mean gradient (S): 7 mm Hg. Valve area (VTI): 1.89  cm^2. Valve area (Vmax): 2.16 cm^2. Valve area (Vmean): 2.16  cm^2. Regurgitation pressure half-time: 425 ms. - Mitral valve: Mildly calcified annulus. Normal thickness leaflets  . There was moderate regurgitation. - Left atrium: The atrium was severely dilated. - Right ventricle: The cavity size was mildly dilated. - Right atrium: The atrium was severely dilated. - Tricuspid valve: There was mild-moderate regurgitation. - Pulmonary arteries: Systolic pressure was moderately increased.  PA peak pressure: 55 mm Hg (S). - Technically adequate study.   Medications: I have reviewed the patient's current medications. Scheduled Meds: . antiseptic oral rinse  7 mL  Mouth Rinse q12n4p  . aspirin EC  81 mg Oral Daily  . atorvastatin  10 mg Oral Daily  . chlorhexidine  15 mL Mouth Rinse BID  . enoxaparin (LOVENOX) injection  40 mg Subcutaneous Q24H  . feeding supplement  1 Container Oral BID BM  . finasteride  5 mg Oral QHS  . furosemide  40 mg Intravenous Daily  . levothyroxine  137 mcg Oral QPM   . magnesium oxide  400 mg Oral BID  . pantoprazole  40 mg Oral BID  . potassium chloride  20 mEq Oral TID  . ropinirole  5 mg Oral QHS  . sacubitril-valsartan  1 tablet Oral BID  . sodium chloride flush  3 mL Intravenous Q12H  . sodium chloride flush  3 mL Intravenous Q12H  . tamsulosin  0.4 mg Oral BID   Continuous Infusions:  PRN Meds:.sodium chloride, diclofenac sodium, guaiFENesin, sodium chloride flush  Assessment/Plan: 1.Acute on Chronic Mixed Systolic and Diastolic CHF:   EF now [id number] He has diuresed 3880 cc, but continues to have complaints of dyspnea, he does have few crackles in the bases but no wheezes. ? Change to po lasix? Creatinine is 0.62 with CO2 26. Na 128  Wt down from 177 to 148 lbs   Agree with [name], as kidney function is adequate and his BP is stable. With his age, however, will discuss with Dr. Bronson Ing about long term use. Will replace potassium.   2. AoV stenosis; Continue afterload reduction, with Enteresto, as OP he was not on ACE or ARB. Marland Kitchenwth A0 regurg.  3. Hyperlipidemia: Continue statin.   4. PPM in situ: Previously followed by Dr. Lovena Le for remote checks and annual visits. Last interrogation, however appears that he has not has this completed since 2013. May need to interrogate prior to discharge.  [employer] for mobitz II placed 2010  Follow[facility] with [facility]  We checked yesterday with pacing and sensing appropriately.  Battery status good, lead measured parameters within normal limits.  2 high ventricular rate episodes, lasting 4-5 sec.    5. Hyponatremia at 128   6. Hypothyroid with TSH 11.03  May need synthroid adjustment.    LOS: 2 days   Time spent with pt. :15 minutes. Cecilie Kicks  Nurse Practitioner Certified Pager [phone] or after 5pm and on weekends call [phone] 01/22/2016, 8:18 AM   The patient was seen and examined, and I agree with the physical exam, assessment and plan as documented above which has been discussed with L.  Ingold NP, with modifications as noted below. Pacemaker functioning normally. I will switch to oral torsemide 20 mg twice daily. Continue low dose Entresto. Symptoms have improved to some degree but has had adequate urinary output.  No further recommendations. Can fu in our office.  Kate Sable, MD, [facility]  01/22/2016 9:20 AM

## 2016-01-23 MED ORDER — POTASSIUM CHLORIDE ER 10 MEQ PO TBCR: 10.0000 meq | EXTENDED_RELEASE_TABLET | Freq: Two times a day (BID) | ORAL | Status: DC

## 2016-01-23 MED ORDER — TORSEMIDE 20 MG PO TABS
20.0000 mg | ORAL_TABLET | Freq: Two times a day (BID) | ORAL | Status: DC
Start: 2016-01-23 — End: 2016-08-21

## 2016-01-23 MED ORDER — SACUBITRIL-VALSARTAN 24-26 MG PO TABS
1.0000 | ORAL_TABLET | Freq: Two times a day (BID) | ORAL | Status: DC
Start: 2016-01-23 — End: 2016-08-19

## 2016-01-23 NOTE — Care Management Important Message (Signed)
Important Message  Patient Details  Name: ZAHRAN BURFORD MRN: [id number] Date of Birth: Aug 09, 1923   Medicare Important Message Given:  Yes    Sherald Barge, RN 01/23/2016, 9:34 AM

## 2016-01-23 NOTE — Care Management Note (Signed)
Case Management Note  Patient Details  Name: YERIEL RITTEL MRN: [id number] Date of Birth: 1923-05-17  Expected Discharge Date:    01/23/2016              Expected Discharge Plan:  [place]  In-House Referral:  NA  Discharge planning Services  CM Consult  Post Acute Care Choice:  Home Health Choice offered to:  Patient, Spouse  DME Arranged:    DME Agency:     HH Arranged:  RN, PT [place] Agency:  [place]  Status of Service:  Completed, signed off  Medicare Important Message Given:  Yes Date Medicare IM Given:    Medicare IM give by:    Date Additional Medicare IM Given:    Additional Medicare Important Message give by:     If discussed at [place] of Stay Meetings, dates discussed:    Additional Comments: Pt discharging home today with [facility] services through [facility]. Wife is aware that [facility] has 48 hours to initiate services. Romualdo Bolk, of [facility], made aware of referral and will obtain pt info from chart. H&P and DC summary will be faxed to [facility].   Sherald Barge, RN 01/23/2016, 9:36 AM

## 2016-01-23 NOTE — Discharge Summary (Signed)
Physician Discharge Summary  Andre Holder [id number] DOB: [birth year] DOA: 01/17/2016   Admit date: 01/17/2016 Discharge date: 01/23/2016  Discharge Diagnoses:  Principal Problem:   Acute on chronic systolic heart failure [facility]) Active Problems:   Hypothyroidism   Hyponatremia   CHF exacerbation (HCC)   Anemia   Shortness of breath   Edema   Protein calorie malnutrition ([place])    Wt Readings from Last 3 Encounters:  01/23/16 148 lb 2.7 oz (67.21 kg)  01/14/16 177 lb (80.287 kg)  01/05/16 177 lb (80.287 kg)     Hospital Course:  This patient is a 82 year old male with a history of heart failure who presented with shortness of breath. Chest x-ray revealed findings of pulmonary edema. He was started on IV Lasix. Andre Holder was initiated. He was seen in consultation by cardiology. His echocardiogram revealed an ejection fraction of 20-25%. He has mild aortic stenosis. He has a pacemaker in place. Pacemaker interrogation revealed normal functioning. He gradually diuresed and began to feel better. He had mild hypokalemia which was supplemented orally. Renal function remained stable.  TSH was elevated at 11. He has previously been euthyroid on his current Synthroid dose. Compliance has been an issue over time.  He has had hyponatremia. Sodium level is now 128. This will be followed as an outpatient.  He is much improved and stable for discharge on the morning of May 17. He will be seen in follow-up in my office in one week. His discharge exam reveals clear lungs with a regular heart rhythm with a stable grade 2 systolic murmur. Extremities reveal no edema. He is breathing comfortably and oxygenating well on room air.  Diuretic therapy is modified to torsemide 20 mg twice a day. Potassium 10 mEq twice a day will also be added.   Discharge Instructions     Medication List    STOP taking these medications        lactose free nutrition Liqd      TAKE these medications         aspirin EC 81 MG tablet  Take 81 mg by mouth daily.     atorvastatin 10 MG tablet  Commonly known as:  LIPITOR  Take 10 mg by mouth daily.     B-complex with vitamin C tablet  Take 1 tablet by mouth daily.     CO Q 10 PO  Take 1 tablet by mouth daily.     finasteride 5 MG tablet  Commonly known as:  PROSCAR  Take 5 mg by mouth at bedtime.     HYDROcodone-acetaminophen 5-325 MG tablet  Commonly known as:  NORCO/VICODIN  Take 2 tablets by mouth every 4 (four) hours as needed.     levothyroxine 175 MCG tablet  Commonly known as:  SYNTHROID, LEVOTHROID  Take 137 mcg by mouth every evening.     magnesium oxide 400 MG tablet  Commonly known as:  MAG-OX  Take 400 mg by mouth 2 (two) times daily.     Melatonin 10 MG Caps  Take 1 capsule by mouth at bedtime.     MUCUS RELIEF 400 MG Tabs tablet  Generic drug:  guaifenesin  Take 400 mg by mouth every 4 (four) hours as needed (for mucus).     pantoprazole 40 MG tablet  Commonly known as:  PROTONIX  Take 40 mg by mouth 2 (two) times daily.     potassium chloride 10 MEQ tablet  Commonly known as:  K-DUR  Take 1 tablet (10  mEq total) by mouth 2 (two) times daily.     Resveratrol 250 MG Caps  Take 1 capsule by mouth 2 (two) times daily.     ropinirole 5 MG tablet  Commonly known as:  REQUIP  Take 5 mg by mouth at bedtime.     sacubitril-valsartan 24-26 MG  Commonly known as:  ENTRESTO  Take 1 tablet by mouth 2 (two) times daily.     tamsulosin 0.4 MG Caps capsule  Commonly known as:  FLOMAX  Take 0.4 mg by mouth 2 (two) times daily.     torsemide 20 MG tablet  Commonly known as:  DEMADEX  Take 1 tablet (20 mg total) by mouth 2 (two) times daily.     VITAMIN B-12 PO  Take 1 tablet by mouth daily.     VITAMIN C PO  Take 1 tablet by mouth daily.     VITAMIN D PO  Take 1 tablet by mouth daily.     VOLTAREN 1 % Gel  Generic drug:  diclofenac sodium  Apply 2 g topically daily as needed (pain).          Apryll Holder 01/23/2016

## 2016-03-24 ENCOUNTER — Emergency Department (HOSPITAL_COMMUNITY)
Admission: EM | Admit: 2016-03-24 | Discharge: 2016-03-24 | Disposition: A | Discharge status: Home / Self Care | Payer: Medicare HMO | Attending: Dermatology | Admitting: Dermatology

## 2016-03-24 ENCOUNTER — Encounter (HOSPITAL_COMMUNITY): Payer: Self-pay | Admitting: Emergency Medicine

## 2016-03-24 DIAGNOSIS — Z8521 Personal history of malignant neoplasm of larynx: Secondary | ICD-10-CM | POA: Diagnosis not present

## 2016-03-24 DIAGNOSIS — Z5321 Procedure and treatment not carried out due to patient leaving prior to being seen by health care provider: Secondary | ICD-10-CM | POA: Insufficient documentation

## 2016-03-24 DIAGNOSIS — E039 Hypothyroidism, unspecified: Secondary | ICD-10-CM | POA: Diagnosis not present

## 2016-03-24 DIAGNOSIS — Z7982 Long term (current) use of aspirin: Secondary | ICD-10-CM | POA: Insufficient documentation

## 2016-03-24 DIAGNOSIS — R109 Unspecified abdominal pain: Secondary | ICD-10-CM | POA: Diagnosis not present

## 2016-03-24 DIAGNOSIS — Z87891 Personal history of nicotine dependence: Secondary | ICD-10-CM | POA: Diagnosis not present

## 2016-03-24 DIAGNOSIS — E785 Hyperlipidemia, unspecified: Secondary | ICD-10-CM | POA: Diagnosis not present

## 2016-03-24 DIAGNOSIS — Z85828 Personal history of other malignant neoplasm of skin: Secondary | ICD-10-CM | POA: Insufficient documentation

## 2016-03-24 DIAGNOSIS — Z79899 Other long term (current) drug therapy: Secondary | ICD-10-CM | POA: Insufficient documentation

## 2016-03-24 HISTORY — DX: Unspecified intestinal obstruction, unspecified as to partial versus complete obstruction: K56.609

## 2016-03-24 NOTE — ED Notes (Signed)
Patient complaining of abdominal pain x 6 weeks. States "I got a hernia and it's about to kill me." Spouse states they called PCP and he wanted him to come to ER to rule out bowel obstruction.

## 2016-03-24 NOTE — ED Notes (Signed)
Pt informed registration that he was going back home

## 2016-03-25 ENCOUNTER — Other Ambulatory Visit (HOSPITAL_COMMUNITY)
Admission: RE | Admit: 2016-03-25 | Discharge: 2016-03-25 | Disposition: A | Discharge status: Home / Self Care | Payer: Medicare HMO | Source: Other Acute Inpatient Hospital | Attending: Internal Medicine | Admitting: Internal Medicine

## 2016-03-25 DIAGNOSIS — I5023 Acute on chronic systolic (congestive) heart failure: Secondary | ICD-10-CM | POA: Diagnosis present

## 2016-03-25 LAB — BASIC METABOLIC PANEL
ANION GAP: 3 — AB (ref 5–15)
BUN: 39 mg/dL — AB (ref 6–20)
CHLORIDE: 104 mmol/L (ref 101–111)
CO2: 27 mmol/L (ref 22–32)
Calcium: 8.9 mg/dL (ref 8.9–10.3)
Creatinine, Ser: 0.99 mg/dL (ref 0.61–1.24)
GFR calc Af Amer: >60 mL/min (ref >60)
GLUCOSE: 91 mg/dL (ref 65–99)
POTASSIUM: 4.6 mmol/L (ref 3.5–5.1)
SODIUM: 134 mmol/L — AB (ref 135–145)

## 2016-03-25 LAB — TSH: TSH: 9.965 u[IU]/mL — AB (ref 0.350–4.500)

## 2016-04-04 ENCOUNTER — Ambulatory Visit (HOSPITAL_COMMUNITY)
Admission: RE | Admit: 2016-04-04 | Discharge: 2016-04-04 | Disposition: A | Discharge status: Home / Self Care | Payer: Medicare HMO | Source: Ambulatory Visit | Attending: Internal Medicine | Admitting: Internal Medicine

## 2016-04-04 ENCOUNTER — Other Ambulatory Visit (HOSPITAL_COMMUNITY): Payer: Self-pay | Admitting: Internal Medicine

## 2016-04-04 DIAGNOSIS — I700 Atherosclerosis of aorta: Secondary | ICD-10-CM | POA: Insufficient documentation

## 2016-04-04 DIAGNOSIS — M545 Low back pain: Secondary | ICD-10-CM

## 2016-04-04 DIAGNOSIS — M5136 Other intervertebral disc degeneration, lumbar region: Secondary | ICD-10-CM | POA: Insufficient documentation

## 2016-05-14 ENCOUNTER — Other Ambulatory Visit (HOSPITAL_COMMUNITY): Payer: Self-pay | Admitting: Internal Medicine

## 2016-05-14 ENCOUNTER — Ambulatory Visit (HOSPITAL_COMMUNITY): Payer: Medicare HMO

## 2016-05-14 DIAGNOSIS — M542 Cervicalgia: Secondary | ICD-10-CM

## 2016-05-14 DIAGNOSIS — W19XXXA Unspecified fall, initial encounter: Secondary | ICD-10-CM

## 2016-05-15 ENCOUNTER — Ambulatory Visit (HOSPITAL_COMMUNITY)
Admission: RE | Admit: 2016-05-15 | Discharge: 2016-05-15 | Disposition: A | Discharge status: Home / Self Care | Payer: Medicare HMO | Source: Ambulatory Visit | Attending: Internal Medicine | Admitting: Internal Medicine

## 2016-05-15 DIAGNOSIS — M542 Cervicalgia: Secondary | ICD-10-CM

## 2016-05-15 DIAGNOSIS — M25551 Pain in right hip: Secondary | ICD-10-CM | POA: Insufficient documentation

## 2016-05-15 DIAGNOSIS — I7090 Unspecified atherosclerosis: Secondary | ICD-10-CM | POA: Insufficient documentation

## 2016-05-15 DIAGNOSIS — M4312 Spondylolisthesis, cervical region: Secondary | ICD-10-CM | POA: Insufficient documentation

## 2016-05-15 DIAGNOSIS — M47892 Other spondylosis, cervical region: Secondary | ICD-10-CM | POA: Insufficient documentation

## 2016-05-15 DIAGNOSIS — W19XXXA Unspecified fall, initial encounter: Secondary | ICD-10-CM

## 2016-08-18 ENCOUNTER — Inpatient Hospital Stay (HOSPITAL_COMMUNITY)
Admission: EM | Admit: 2016-08-18 | Discharge: 2016-08-21 | DRG: 292 | Disposition: A | Discharge status: Home / Self Care | Payer: Medicare HMO | Attending: Internal Medicine | Admitting: Internal Medicine

## 2016-08-18 ENCOUNTER — Encounter (HOSPITAL_COMMUNITY): Payer: Self-pay | Admitting: Emergency Medicine

## 2016-08-18 ENCOUNTER — Emergency Department (HOSPITAL_COMMUNITY): Payer: Medicare HMO

## 2016-08-18 DIAGNOSIS — I441 Atrioventricular block, second degree: Secondary | ICD-10-CM | POA: Diagnosis present

## 2016-08-18 DIAGNOSIS — E039 Hypothyroidism, unspecified: Secondary | ICD-10-CM | POA: Diagnosis present

## 2016-08-18 DIAGNOSIS — C329 Malignant neoplasm of larynx, unspecified: Secondary | ICD-10-CM | POA: Diagnosis present

## 2016-08-18 DIAGNOSIS — Z87891 Personal history of nicotine dependence: Secondary | ICD-10-CM

## 2016-08-18 DIAGNOSIS — E871 Hypo-osmolality and hyponatremia: Secondary | ICD-10-CM | POA: Diagnosis present

## 2016-08-18 DIAGNOSIS — Z79899 Other long term (current) drug therapy: Secondary | ICD-10-CM

## 2016-08-18 DIAGNOSIS — Z823 Family history of stroke: Secondary | ICD-10-CM

## 2016-08-18 DIAGNOSIS — D649 Anemia, unspecified: Secondary | ICD-10-CM | POA: Diagnosis present

## 2016-08-18 DIAGNOSIS — Z833 Family history of diabetes mellitus: Secondary | ICD-10-CM

## 2016-08-18 DIAGNOSIS — Z8521 Personal history of malignant neoplasm of larynx: Secondary | ICD-10-CM

## 2016-08-18 DIAGNOSIS — E785 Hyperlipidemia, unspecified: Secondary | ICD-10-CM | POA: Diagnosis present

## 2016-08-18 DIAGNOSIS — K219 Gastro-esophageal reflux disease without esophagitis: Secondary | ICD-10-CM | POA: Diagnosis present

## 2016-08-18 DIAGNOSIS — R339 Retention of urine, unspecified: Secondary | ICD-10-CM | POA: Diagnosis present

## 2016-08-18 DIAGNOSIS — I359 Nonrheumatic aortic valve disorder, unspecified: Secondary | ICD-10-CM | POA: Diagnosis present

## 2016-08-18 DIAGNOSIS — I70208 Unspecified atherosclerosis of native arteries of extremities, other extremity: Secondary | ICD-10-CM | POA: Diagnosis present

## 2016-08-18 DIAGNOSIS — Z7982 Long term (current) use of aspirin: Secondary | ICD-10-CM

## 2016-08-18 DIAGNOSIS — Z85828 Personal history of other malignant neoplasm of skin: Secondary | ICD-10-CM

## 2016-08-18 DIAGNOSIS — R0902 Hypoxemia: Secondary | ICD-10-CM | POA: Diagnosis present

## 2016-08-18 DIAGNOSIS — I5043 Acute on chronic combined systolic (congestive) and diastolic (congestive) heart failure: Principal | ICD-10-CM | POA: Diagnosis present

## 2016-08-18 DIAGNOSIS — Z96653 Presence of artificial knee joint, bilateral: Secondary | ICD-10-CM | POA: Diagnosis present

## 2016-08-18 DIAGNOSIS — I509 Heart failure, unspecified: Secondary | ICD-10-CM

## 2016-08-18 DIAGNOSIS — I340 Nonrheumatic mitral (valve) insufficiency: Secondary | ICD-10-CM | POA: Diagnosis present

## 2016-08-18 DIAGNOSIS — Z9002 Acquired absence of larynx: Secondary | ICD-10-CM

## 2016-08-18 DIAGNOSIS — I5023 Acute on chronic systolic (congestive) heart failure: Secondary | ICD-10-CM | POA: Diagnosis present

## 2016-08-18 DIAGNOSIS — Z806 Family history of leukemia: Secondary | ICD-10-CM

## 2016-08-18 DIAGNOSIS — G2581 Restless legs syndrome: Secondary | ICD-10-CM | POA: Diagnosis present

## 2016-08-18 DIAGNOSIS — R0602 Shortness of breath: Secondary | ICD-10-CM | POA: Diagnosis not present

## 2016-08-18 DIAGNOSIS — Z930 Tracheostomy status: Secondary | ICD-10-CM

## 2016-08-18 DIAGNOSIS — Z950 Presence of cardiac pacemaker: Secondary | ICD-10-CM

## 2016-08-18 DIAGNOSIS — I350 Nonrheumatic aortic (valve) stenosis: Secondary | ICD-10-CM | POA: Diagnosis present

## 2016-08-18 DIAGNOSIS — Z8673 Personal history of transient ischemic attack (TIA), and cerebral infarction without residual deficits: Secondary | ICD-10-CM

## 2016-08-18 LAB — COMPREHENSIVE METABOLIC PANEL
ALBUMIN: 3.5 g/dL (ref 3.5–5.0)
ALT: 23 U/L (ref 17–63)
AST: 27 U/L (ref 15–41)
Alkaline Phosphatase: 90 U/L (ref 38–126)
Anion gap: 7 (ref 5–15)
BILIRUBIN TOTAL: 0.6 mg/dL (ref 0.3–1.2)
BUN: 22 mg/dL — AB (ref 6–20)
CO2: 23 mmol/L (ref 22–32)
CREATININE: 0.78 mg/dL (ref 0.61–1.24)
Calcium: 8.6 mg/dL — ABNORMAL LOW (ref 8.9–10.3)
Chloride: 95 mmol/L — ABNORMAL LOW (ref 101–111)
GFR calc Af Amer: >60 mL/min (ref >60)
GLUCOSE: 95 mg/dL (ref 65–99)
Potassium: 4.4 mmol/L (ref 3.5–5.1)
Sodium: 125 mmol/L — ABNORMAL LOW (ref 135–145)
TOTAL PROTEIN: 6.7 g/dL (ref 6.5–8.1)

## 2016-08-18 LAB — TROPONIN I: Troponin I: <0.03 ng/mL (ref <0.03)

## 2016-08-18 LAB — CBC WITH DIFFERENTIAL/PLATELET
Basophils Absolute: 0.1 10*3/uL (ref 0.0–0.1)
Basophils Relative: 1 %
Eosinophils Absolute: 0.3 10*3/uL (ref 0.0–0.7)
Eosinophils Relative: 5 %
HCT: 30.9 % — ABNORMAL LOW (ref 39.0–52.0)
HEMOGLOBIN: 10.6 g/dL — AB (ref 13.0–17.0)
LYMPHS ABS: 0.8 10*3/uL (ref 0.7–4.0)
LYMPHS PCT: 13 %
MCH: 34.1 pg — AB (ref 26.0–34.0)
MCHC: 34.3 g/dL (ref 30.0–36.0)
MCV: 99.4 fL (ref 78.0–100.0)
MONOS PCT: 10 %
Monocytes Absolute: 0.6 10*3/uL (ref 0.1–1.0)
NEUTROS ABS: 4.4 10*3/uL (ref 1.7–7.7)
NEUTROS PCT: 71 %
Platelets: 225 10*3/uL (ref 150–400)
RBC: 3.11 MIL/uL — AB (ref 4.22–5.81)
RDW: 12.7 % (ref 11.5–15.5)
WBC: 6.2 10*3/uL (ref 4.0–10.5)

## 2016-08-18 LAB — BRAIN NATRIURETIC PEPTIDE: B Natriuretic Peptide: 1635 pg/mL — ABNORMAL HIGH (ref 0.0–100.0)

## 2016-08-18 NOTE — ED Provider Notes (Signed)
[facility] DEPT Provider Note   CSN: [id number] Arrival date & time: 08/18/16  2253  By signing my name below, I, Eunice Blase, attest that this documentation has been prepared under the direction and in the presence of Rolland Porter, MD. Electronically signed, Eunice Blase, ED Scribe. 08/18/16. 11:25 PM.   Time seen 23:04 PM  History   Chief Complaint Chief Complaint  Patient presents with  . Shortness of Breath  . Altered Mental Status   The history is provided by the patient and a relative. No language interpreter was used.    Level 5 caveat for altered mental status  HPI Comments: Andre Holder is a 82 y.o. male with PMHx of a tracheal tube placement and multiple incidences of CHF who presents to the Emergency Department complaining of SOB x 2 days. He states that his SOB began last night. Pt reports associated back pain and productive cough. His son reports associated episodic changes in orientation, DOE and fatigue. Pt reports that he puts saline down tracheal tube at home but does not use a nebulizer or inhaler. Son believes that the pt's symptoms may be secondary to a tracheal tube complication. Pt states that he went to the [place] earlier today for a regular appointment and  for his symptoms, but he and his son state that the visit was not very helpful. Son states that pt goes to the [facility] every 2 months.  Pt denies fever, chest pain, leg swelling or wheezing. Pt always sleeps sitting up. Pt chronically produces mucus that is clear. He does not use oxygen at home.  PCP Dr Willey Blade PCP [facility] in [place]    Past Medical History:  Diagnosis Date  . Adrenal hyperplasia ([place])    Stable on serial imaging  . Anemia    minimal in 2011 with hemoglobin of 12.2 and high normal MCV  . Arteriosclerotic cardiovascular disease (ASCVD)    Nonobstructive; 09/2008 50% proximal and 40% mid LAD; 25% circumflex; 30% RCA; mild global LV dysfunction with EF of 45%. No aortic stenosis.  .  Borderline hypertension    Normal CMet in 2011  . Cancer of larynx ([place])    laryngectomy in 1988; postoperative radiation therapy  . Carotid stenosis   . 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 Degenerative joint disease    s/p bilateral TKR  . GERD (gastroesophageal reflux disease)   . Hyperlipidemia    Lipid profile in 04/2010:115, 98, 43, 52.  Marland Kitchen Hypothyroidism   . Mild aortic stenosis    not documented at catheterization; verified by echo in 2011  . Mitral regurgitation   . Mobitz (type) II atrioventricular block    With bradycardia; Medtronic pacemaker implanted in 09/2008  . Peripheral vascular disease ([place])    With a 70% innominate artery stenosis and nonobstructive carotid stenosis  . Skin cancer   . Small bowel obstruction   . Tobacco abuse, in remission    Remote  . Weight loss    50 pounds between 1991 and 2011    Patient Active Problem List   Diagnosis Date Noted  . Acute on chronic systolic heart failure ([place]) 01/19/2016  . Shortness of breath 01/18/2016  . Edema 01/18/2016  . Protein calorie malnutrition ([place]) 01/18/2016  . Acute on chronic systolic congestive heart failure ([place])   . Edema extremities   . Partial small bowel obstruction 11/05/2015  . Anemia 11/05/2015  . Malnutrition of moderate degree (  [place]) 01/11/2015  . Small bowel obstruction 12/23/2014  . Chronic systolic congestive heart failure, NYHA class 2 ([place]) 12/23/2014  . SBO (small bowel obstruction) ([place]) 12/23/2014  . CHF exacerbation ([place]) 08/31/2014  . Acute on chronic systolic CHF (congestive heart failure) ([place]) 08/31/2014  . Chest pain 08/31/2014  . Dyspnea 08/31/2014  . CHF (congestive heart failure) ([place]) 08/25/2013  . Syncope 10/09/2011  . Hyponatremia 10/09/2011  . GERD 06/17/2010  . WEIGHT LOSS, ABNORMAL 06/17/2010  . ANEMIA 04/08/2010  . Mobitz type II atrioventricular block 04/08/2010  . Hyperlipidemia 10/24/2009  . PACEMAKER,  PERMANENT 09/20/2009  . CANCER, LARYNX 03/07/2009  . Aortic valve disorder 03/07/2009  . CEREBROVASCULAR DISEASE 03/07/2009  . PERIPHERAL VASCULAR DISEASE 03/07/2009  . Tobacco abuse, in remission 03/07/2009  . Hypothyroidism 12/27/2008  . OSTEOARTHRITIS 12/27/2008    Past Surgical History:  Procedure Laterality Date  . APPENDECTOMY  1973  . CATARACT EXTRACTION, BILATERAL    . DECOMPRESSION FACIAL NERVE     Right median  . INSERT / REPLACE / REMOVE PACEMAKER    . KNEE ARTHROSCOPY     Left  . LARYNGECTOMY  1988   S/P laryngectomy and radiation therapy  . PACEMAKER INSERTION    . TOTAL KNEE ARTHROPLASTY     Bilateral, 19 years ago       Home Medications    Prior to Admission medications   Medication Sig Start Date End Date Taking? Authorizing Provider  Ascorbic Acid (VITAMIN C PO) Take 1 tablet by mouth daily.    Historical Provider, MD  aspirin EC 81 MG tablet Take 81 mg by mouth daily.    Historical Provider, MD  atorvastatin (LIPITOR) 10 MG tablet Take 10 mg by mouth daily.    Historical Provider, MD  B Complex-C (B-COMPLEX WITH VITAMIN C) tablet Take 1 tablet by mouth daily.    Historical Provider, MD  Cholecalciferol (VITAMIN D PO) Take 1 tablet by mouth daily.    Historical Provider, MD  Coenzyme Q10 (CO Q 10 PO) Take 1 tablet by mouth daily.     Historical Provider, MD  Cyanocobalamin (VITAMIN B-12 PO) Take 1 tablet by mouth daily.    Historical Provider, MD  diclofenac sodium (VOLTAREN) 1 % GEL Apply 2 g topically daily as needed (pain).    Historical Provider, MD  finasteride (PROSCAR) 5 MG tablet Take 5 mg by mouth at bedtime.    Historical Provider, MD  guaifenesin (MUCUS RELIEF) 400 MG TABS tablet Take 400 mg by mouth every 4 (four) hours as needed (for mucus).    Historical Provider, MD  HYDROcodone-acetaminophen (NORCO/VICODIN) 5-325 MG tablet Take 2 tablets by mouth every 4 (four) hours as needed. 12/24/15   [name], MD  levothyroxine (SYNTHROID,  LEVOTHROID) 175 MCG tablet Take 137 mcg by mouth every evening.     Historical Provider, MD  magnesium oxide (MAG-OX) 400 MG tablet Take 400 mg by mouth 2 (two) times daily.    Historical Provider, MD  Melatonin 10 MG CAPS Take 1 capsule by mouth at bedtime.     Historical Provider, MD  pantoprazole (PROTONIX) 40 MG tablet Take 40 mg by mouth 2 (two) times daily.    Historical Provider, MD  potassium chloride (K-DUR) 10 MEQ tablet Take 1 tablet (10 mEq total) by mouth 2 (two) times daily. 01/23/16   Asencion Noble, MD  Resveratrol 250 MG CAPS Take 1 capsule by mouth 2 (two) times daily.    Historical Provider, MD  ropinirole (REQUIP) 5  MG tablet Take 5 mg by mouth at bedtime.    Historical Provider, MD  sacubitril-valsartan (ENTRESTO) 24-26 MG Take 1 tablet by mouth 2 (two) times daily. 01/23/16   Asencion Noble, MD  Tamsulosin HCl (FLOMAX) 0.4 MG CAPS Take 0.4 mg by mouth 2 (two) times daily.     Historical Provider, MD  torsemide (DEMADEX) 20 MG tablet Take 1 tablet (20 mg total) by mouth 2 (two) times daily. 01/23/16   Asencion Noble, MD    Family History Family History  Problem Relation Age of Onset  . Stroke Mother   . Leukemia Father   . Stroke Other   . Diabetes Other   . Colon cancer Neg Hx   . Liver disease Neg Hx   . GI problems Neg Hx     Social History Social History  Substance Use Topics  . Smoking status: Former Smoker    Types: Cigarettes  . Smokeless tobacco: Former Systems developer    Quit date: 09/08/1982     Comment: Quit 30 years  . Alcohol use No  lives with spouse Lives at home   Allergies   Patient has no known allergies.   Review of Systems Review of Systems  Constitutional: Positive for fatigue. Negative for fever.  Respiratory: Positive for cough (productive) and shortness of breath. Negative for wheezing.   Cardiovascular: Negative for chest pain and leg swelling.  Musculoskeletal: Positive for back pain.  Psychiatric/Behavioral: Positive for decreased concentration.  All  other systems reviewed and are negative.    Physical Exam Updated Vital Signs BP 105/56 (BP Location: Left Arm)   Pulse 65   Temp 98 F (36.7 C) (Oral)   Resp 18   Ht 6\' 1"  (1.854 m)   Wt 160 lb (72.6 kg)   SpO2 (!) 86%   BMI 21.11 kg/m   Vital signs normal    Physical Exam  Constitutional:  Non-toxic appearance. He does not appear ill. No distress.  Frail, thin elderly male.  HENT:  Head: Normocephalic and atraumatic.  Right Ear: External ear normal.  Left Ear: External ear normal.  Nose: Nose normal. No mucosal edema or rhinorrhea.  Mouth/Throat: Oropharynx is clear and moist and mucous membranes are normal. No dental abscesses or uvula swelling.  Eyes: Conjunctivae and EOM are normal. Pupils are equal, round, and reactive to light.  Neck: Normal range of motion and full passive range of motion without pain. Neck supple.  Trach in place with large opening.  Cardiovascular: Normal rate and regular rhythm.  Exam reveals no gallop and no friction rub.   Murmur (systolic) heard.  Systolic murmur is present  Systolic murmur heard best in the right upper sternal border: low pitched.  Pulmonary/Chest: He is in respiratory distress. He has decreased breath sounds. He has no wheezes. He has no rhonchi. He has no rales. He exhibits no tenderness and no crepitus.  Abdominal: Soft. Normal appearance and bowel sounds are normal. He exhibits no distension. There is no tenderness. There is no rebound and no guarding.  Musculoskeletal: Normal range of motion. He exhibits no edema or tenderness.  Moves all extremities well.   Neurological: He is alert. He has normal strength. No cranial nerve deficit.  Skin: Skin is warm, dry and intact. No rash noted. No erythema. No pallor.  Psychiatric: He has a normal mood and affect. His speech is normal and behavior is normal. His mood appears not anxious.  Nursing note and vitals reviewed.    ED Treatments /  Results  DIAGNOSTIC  STUDIES: Oxygen Saturation is 86%-92% on RA, low by my interpretation.      Labs (all labs ordered are listed, but only abnormal results are displayed) Results for orders placed or performed during the hospital encounter of 08/18/16  CBC with Differential  Result Value Ref Range   WBC 6.2 4.0 - 10.5 K/uL   RBC 3.11 (L) 4.22 - 5.81 MIL/uL   Hemoglobin 10.6 (L) 13.0 - 17.0 g/dL   HCT 30.9 (L) 39.0 - 52.0 %   MCV 99.4 78.0 - 100.0 fL   MCH 34.1 (H) 26.0 - 34.0 pg   MCHC 34.3 30.0 - 36.0 g/dL   RDW 12.7 11.5 - 15.5 %   Platelets 225 150 - 400 K/uL   Neutrophils Relative % 71 %   Neutro Abs 4.4 1.7 - 7.7 K/uL   Lymphocytes Relative 13 %   Lymphs Abs 0.8 0.7 - 4.0 K/uL   Monocytes Relative 10 %   Monocytes Absolute 0.6 0.1 - 1.0 K/uL   Eosinophils Relative 5 %   Eosinophils Absolute 0.3 0.0 - 0.7 K/uL   Basophils Relative 1 %   Basophils Absolute 0.1 0.0 - 0.1 K/uL  Troponin I  Result Value Ref Range   Troponin I <0.03 <0.03 ng/mL  Brain natriuretic peptide  Result Value Ref Range   B Natriuretic Peptide 1,635.0 (H) 0.0 - 100.0 pg/mL  Comprehensive metabolic panel  Result Value Ref Range   Sodium 125 (L) 135 - 145 mmol/L   Potassium 4.4 3.5 - 5.1 mmol/L   Chloride 95 (L) 101 - 111 mmol/L   CO2 23 22 - 32 mmol/L   Glucose, Bld 95 65 - 99 mg/dL   BUN 22 (H) 6 - 20 mg/dL   Creatinine, Ser 0.78 0.61 - 1.24 mg/dL   Calcium 8.6 (L) 8.9 - 10.3 mg/dL   Total Protein 6.7 6.5 - 8.1 g/dL   Albumin 3.5 3.5 - 5.0 g/dL   AST 27 15 - 41 U/L   ALT 23 17 - 63 U/L   Alkaline Phosphatase 90 38 - 126 U/L   Total Bilirubin 0.6 0.3 - 1.2 mg/dL   GFR calc non Af Amer >60 >60 mL/min   GFR calc Af Amer >60 >60 mL/min   Anion gap 7 5 - 15   Laboratory interpretation all normal except Elevated BNP, hyponatremia, stable anemia    EKG  EKG Interpretation  Date/Time:  Monday August 18 2016 23:10:53 EST Ventricular Rate:  65 PR Interval:    QRS Duration: 190 QT Interval:  499 QTC  Calculation: 519 R Axis:   -33 Text Interpretation:  Ventricular-paced rhythm No further analysis attempted due to paced rhythm No significant change since last tracing 17 Jan 2016 Confirmed by [place]  MD-I, Alicea Wente ([id number]) on 08/18/2016 11:22:35 PM       Radiology Dg Chest Port 1 View  Result Date: 08/18/2016 CLINICAL DATA:  Shortness of breath for 2 days EXAM: PORTABLE CHEST 1 VIEW COMPARISON:  01/17/2016 FINDINGS: Portable view chest demonstrates left-sided dual lead pacemaker with leads projecting over right atrium and right ventricle. There are bilateral pleural effusions. There is mild to moderate cardiomegaly with central vascular congestion and diffuse interstitial and alveolar opacities suspicious for pulmonary edema. Unable to exclude bibasilar pneumonia. No pneumothorax. IMPRESSION: 1. Cardiomegaly with central vascular congestion and moderate interstitial and alveolar edema 2. Small bilateral effusions. Unable to exclude bibasilar atelectasis or pneumonia Electronically Signed   By: Madie Reno.D.  On: 08/18/2016 23:43    Procedures Procedures (including critical care time)  Medications Ordered in ED Medications  furosemide (LASIX) injection 60 mg (60 mg Intravenous Given 08/19/16 0021)     Initial Impression / Assessment and Plan / ED Course  I have reviewed the triage vital signs and the nursing notes.  Pertinent labs & imaging results that were available during my care of the patient were reviewed by me and considered in my medical decision making (see chart for details).  Clinical Course    COORDINATION OF CARE: 11:12 PM Discussed treatment plan with pt at bedside and pt agreed to plan. Chest x-ray, EKG, laboratory testing was ordered. Patient has a past history congestive heart failure and BNP was also ordered.   Patient was noted to have some hypoxia with his pulse ox varying between 86% to 91% on room air. He had a trach mask placed and his oxygen improved to  99%.  After reviewing patient's x-rays and laboratory testing he was given Lasix IV. His blood pressure was borderline at [id number] systolic so nitroglycerin paste was not placed.  Patient and son were given his test results. Due to his hypoxia we discussed admission and they are agreeable.  12:40 AM Dr Marin Comment, will see patient for admission  Final Clinical Impressions(s) / ED Diagnoses   Final diagnoses:  Acute on chronic congestive heart failure, unspecified congestive heart failure type ([place])  Hypoxia  Hyponatremia    Disposition admission  Rolland Porter, MD, FACEP   I personally performed the services described in this documentation, which was scribed in my presence. The recorded information has been reviewed and considered.  Rolland Porter, MD, Barbette Or, MD 08/19/16 [phone]

## 2016-08-18 NOTE — ED Triage Notes (Signed)
Son reports pt has had SOB all day today. Pt was seen at [place], son unsure what was done. Per son's report pt has a trach and needs to have a tube reinserted to dilate the airway due to strictures. Pt unwilling to do so.

## 2016-08-19 ENCOUNTER — Encounter (HOSPITAL_COMMUNITY): Payer: Self-pay

## 2016-08-19 DIAGNOSIS — Z8521 Personal history of malignant neoplasm of larynx: Secondary | ICD-10-CM | POA: Diagnosis not present

## 2016-08-19 DIAGNOSIS — Z806 Family history of leukemia: Secondary | ICD-10-CM | POA: Diagnosis not present

## 2016-08-19 DIAGNOSIS — I5023 Acute on chronic systolic (congestive) heart failure: Secondary | ICD-10-CM

## 2016-08-19 DIAGNOSIS — E785 Hyperlipidemia, unspecified: Secondary | ICD-10-CM | POA: Diagnosis present

## 2016-08-19 DIAGNOSIS — R0902 Hypoxemia: Secondary | ICD-10-CM | POA: Diagnosis present

## 2016-08-19 DIAGNOSIS — E871 Hypo-osmolality and hyponatremia: Secondary | ICD-10-CM | POA: Diagnosis present

## 2016-08-19 DIAGNOSIS — I441 Atrioventricular block, second degree: Secondary | ICD-10-CM | POA: Diagnosis present

## 2016-08-19 DIAGNOSIS — I359 Nonrheumatic aortic valve disorder, unspecified: Secondary | ICD-10-CM | POA: Diagnosis not present

## 2016-08-19 DIAGNOSIS — Z823 Family history of stroke: Secondary | ICD-10-CM | POA: Diagnosis not present

## 2016-08-19 DIAGNOSIS — Z85828 Personal history of other malignant neoplasm of skin: Secondary | ICD-10-CM | POA: Diagnosis not present

## 2016-08-19 DIAGNOSIS — E039 Hypothyroidism, unspecified: Secondary | ICD-10-CM | POA: Diagnosis present

## 2016-08-19 DIAGNOSIS — Z9002 Acquired absence of larynx: Secondary | ICD-10-CM | POA: Diagnosis not present

## 2016-08-19 DIAGNOSIS — G2581 Restless legs syndrome: Secondary | ICD-10-CM | POA: Diagnosis present

## 2016-08-19 DIAGNOSIS — I350 Nonrheumatic aortic (valve) stenosis: Secondary | ICD-10-CM | POA: Diagnosis present

## 2016-08-19 DIAGNOSIS — K219 Gastro-esophageal reflux disease without esophagitis: Secondary | ICD-10-CM | POA: Diagnosis present

## 2016-08-19 DIAGNOSIS — Z833 Family history of diabetes mellitus: Secondary | ICD-10-CM | POA: Diagnosis not present

## 2016-08-19 DIAGNOSIS — Z950 Presence of cardiac pacemaker: Secondary | ICD-10-CM | POA: Diagnosis not present

## 2016-08-19 DIAGNOSIS — R339 Retention of urine, unspecified: Secondary | ICD-10-CM | POA: Diagnosis present

## 2016-08-19 DIAGNOSIS — Z930 Tracheostomy status: Secondary | ICD-10-CM | POA: Diagnosis not present

## 2016-08-19 DIAGNOSIS — I70208 Unspecified atherosclerosis of native arteries of extremities, other extremity: Secondary | ICD-10-CM | POA: Diagnosis present

## 2016-08-19 DIAGNOSIS — I340 Nonrheumatic mitral (valve) insufficiency: Secondary | ICD-10-CM | POA: Diagnosis present

## 2016-08-19 DIAGNOSIS — Z87891 Personal history of nicotine dependence: Secondary | ICD-10-CM | POA: Diagnosis not present

## 2016-08-19 DIAGNOSIS — I5043 Acute on chronic combined systolic (congestive) and diastolic (congestive) heart failure: Secondary | ICD-10-CM | POA: Diagnosis present

## 2016-08-19 DIAGNOSIS — R0602 Shortness of breath: Secondary | ICD-10-CM | POA: Diagnosis present

## 2016-08-19 DIAGNOSIS — D649 Anemia, unspecified: Secondary | ICD-10-CM | POA: Diagnosis present

## 2016-08-19 DIAGNOSIS — Z8673 Personal history of transient ischemic attack (TIA), and cerebral infarction without residual deficits: Secondary | ICD-10-CM | POA: Diagnosis not present

## 2016-08-19 DIAGNOSIS — Z96653 Presence of artificial knee joint, bilateral: Secondary | ICD-10-CM | POA: Diagnosis present

## 2016-08-19 LAB — TSH: TSH: 12.028 u[IU]/mL — ABNORMAL HIGH (ref 0.350–4.500)

## 2016-08-19 MED ORDER — SACUBITRIL-VALSARTAN 24-26 MG PO TABS
1.0000 | ORAL_TABLET | Freq: Two times a day (BID) | ORAL | Status: DC
  Administered 2016-08-19 (×2): 1 via ORAL
  Filled 2016-08-19 (×4): qty 1

## 2016-08-19 MED ORDER — CO Q 10 10 MG PO CAPS: ORAL_CAPSULE | Freq: Every day | ORAL | Status: DC

## 2016-08-19 MED ORDER — CHOLECALCIFEROL 10 MCG (400 UNIT) PO TABS
400.0000 [IU] | ORAL_TABLET | Freq: Every day | ORAL | Status: DC
  Administered 2016-08-19 – 2016-08-21 (×3): 400 [IU] via ORAL
  Filled 2016-08-19 (×3): qty 1

## 2016-08-19 MED ORDER — POTASSIUM CHLORIDE CRYS ER 10 MEQ PO TBCR
10.0000 meq | EXTENDED_RELEASE_TABLET | Freq: Two times a day (BID) | ORAL | Status: DC
  Administered 2016-08-19 – 2016-08-21 (×6): 10 meq via ORAL
  Filled 2016-08-19 (×6): qty 1

## 2016-08-19 MED ORDER — LEVOTHYROXINE SODIUM 25 MCG PO TABS
137.0000 ug | ORAL_TABLET | Freq: Every evening | ORAL | Status: DC
  Administered 2016-08-19 – 2016-08-20 (×2): 137 ug via ORAL
  Filled 2016-08-19 (×2): qty 1

## 2016-08-19 MED ORDER — ASPIRIN EC 81 MG PO TBEC
81.0000 mg | DELAYED_RELEASE_TABLET | Freq: Every day | ORAL | Status: DC
  Administered 2016-08-19 – 2016-08-21 (×3): 81 mg via ORAL
  Filled 2016-08-19 (×3): qty 1

## 2016-08-19 MED ORDER — B COMPLEX-C PO TABS
1.0000 | ORAL_TABLET | Freq: Every day | ORAL | Status: DC
  Administered 2016-08-19 – 2016-08-20 (×2): 1 via ORAL
  Filled 2016-08-19 (×7): qty 1

## 2016-08-19 MED ORDER — ATORVASTATIN CALCIUM 10 MG PO TABS
10.0000 mg | ORAL_TABLET | Freq: Every day | ORAL | Status: DC
  Administered 2016-08-19 – 2016-08-21 (×3): 10 mg via ORAL
  Filled 2016-08-19 (×3): qty 1

## 2016-08-19 MED ORDER — FUROSEMIDE 10 MG/ML IJ SOLN
60.0000 mg | Freq: Once | INTRAMUSCULAR | Status: AC
  Administered 2016-08-19: 60 mg via INTRAVENOUS
  Filled 2016-08-19: qty 6

## 2016-08-19 MED ORDER — FUROSEMIDE 10 MG/ML IJ SOLN
40.0000 mg | Freq: Two times a day (BID) | INTRAMUSCULAR | Status: DC
  Administered 2016-08-19 – 2016-08-21 (×5): 40 mg via INTRAVENOUS
  Filled 2016-08-19 (×4): qty 4

## 2016-08-19 MED ORDER — ROPINIROLE HCL 1 MG PO TABS
5.0000 mg | ORAL_TABLET | Freq: Every day | ORAL | Status: DC
  Administered 2016-08-19 – 2016-08-20 (×3): 5 mg via ORAL
  Filled 2016-08-19 (×4): qty 5

## 2016-08-19 MED ORDER — RESVERATROL 250 MG PO CAPS: 1.0000 | ORAL_CAPSULE | Freq: Two times a day (BID) | ORAL | Status: DC

## 2016-08-19 MED ORDER — HEPARIN SODIUM (PORCINE) 5000 UNIT/ML IJ SOLN
5000.0000 [IU] | Freq: Three times a day (TID) | INTRAMUSCULAR | Status: DC
Start: 2016-08-19 — End: 2016-08-21
  Administered 2016-08-19 – 2016-08-21 (×7): 5000 [IU] via SUBCUTANEOUS
  Filled 2016-08-19 (×7): qty 1

## 2016-08-19 MED ORDER — FINASTERIDE 5 MG PO TABS
5.0000 mg | ORAL_TABLET | Freq: Every day | ORAL | Status: DC
  Administered 2016-08-19 – 2016-08-20 (×2): 5 mg via ORAL
  Filled 2016-08-19 (×6): qty 1

## 2016-08-19 MED ORDER — MAGNESIUM OXIDE 400 (241.3 MG) MG PO TABS
400.0000 mg | ORAL_TABLET | Freq: Two times a day (BID) | ORAL | Status: DC
  Administered 2016-08-19 – 2016-08-20 (×5): 400 mg via ORAL
  Filled 2016-08-19 (×7): qty 1

## 2016-08-19 MED ORDER — PANTOPRAZOLE SODIUM 40 MG PO TBEC
40.0000 mg | DELAYED_RELEASE_TABLET | Freq: Two times a day (BID) | ORAL | Status: DC
  Administered 2016-08-19 – 2016-08-21 (×6): 40 mg via ORAL
  Filled 2016-08-19 (×6): qty 1

## 2016-08-19 MED ORDER — MELATONIN 10 MG PO CAPS: 1.0000 | ORAL_CAPSULE | Freq: Every day | ORAL | Status: DC

## 2016-08-19 MED ORDER — HYDROCODONE-ACETAMINOPHEN 5-325 MG PO TABS
2.0000 | ORAL_TABLET | ORAL | Status: DC | PRN
  Administered 2016-08-19 – 2016-08-20 (×4): 2 via ORAL
  Filled 2016-08-19 (×4): qty 2

## 2016-08-19 MED ORDER — TAMSULOSIN HCL 0.4 MG PO CAPS
0.4000 mg | ORAL_CAPSULE | Freq: Two times a day (BID) | ORAL | Status: DC
  Administered 2016-08-19 – 2016-08-21 (×6): 0.4 mg via ORAL
  Filled 2016-08-19 (×6): qty 1

## 2016-08-19 NOTE — H&P (Signed)
History and Physical    Andre Holder [id number] DOB: [birth year] DOA: 08/18/2016  PCP: Asencion Noble, MD  Patient coming from: Home.    Chief Complaint:  SOB.   HPI: Andre Holder  is a 82 y.o. male, with hx of CHF (EF 20-25%), Mobitz type 2 s/p pacer, CVA, PVD, bilateral carotid stenosis, hx of laryngeal cancer s/p surgery now with Trach, hx of improvement of his EF to 35% subsequently, lives at home, presented to the ER with SOB and orthopnea.  Evaluation in the ER showed CXR with CHF, and labs showed Cr of 0.78 with Na of 125 and no leukocytosis.  His BNP was elevated to 1600, and his EKG showed paced rhythm.  His troponin was negative.  He was given IV Lasix of 60mg  in the ER, and he diuresed about 800 cc and felt markedly better.  Hospitalist was asked to admit him OBS for acute on chronic combined CHF, as he was still borderline hypoxic with Sat 88 percent, not home  Home oxygen.    ED Course:  See above.  Rewiew of Systems:  Constitutional: Negative for malaise, fever and chills. No significant weight loss or weight gain Eyes: Negative for eye pain, redness and discharge, diplopia, visual changes, or flashes of light. ENMT: Negative for ear pain, hoarseness, nasal congestion, sinus pressure and sore throat. No headaches; tinnitus, drooling, or problem swallowing. Cardiovascular: Negative for chest pain, palpitations, diaphoresis,  and peripheral edema. ; No orthopnea, PND Respiratory: Negative for cough, hemoptysis, wheezing and stridor. No pleuritic chestpain. Gastrointestinal: Negative for diarrhea, constipation,  melena, blood in stool, hematemesis, jaundice and rectal bleeding.    Genitourinary: Negative for frequency, dysuria, incontinence,flank pain and hematuria; Musculoskeletal: Negative for back pain and neck pain. Negative for swelling and trauma.;  Skin: . Negative for pruritus, rash, abrasions, bruising and skin lesion.; ulcerations Neuro: Negative for headache,  lightheadedness and neck stiffness. Negative for weakness, altered level of consciousness , altered mental status, extremity weakness, burning feet, involuntary movement, seizure and syncope.  Psych: negative for anxiety, depression, insomnia, tearfulness, panic attacks, hallucinations, paranoia, suicidal or homicidal ideation    Past Medical History:  Diagnosis Date  . Adrenal hyperplasia ([place])    Stable on serial imaging  . Anemia    minimal in 2011 with hemoglobin of 12.2 and high normal MCV  . Arteriosclerotic cardiovascular disease (ASCVD)    Nonobstructive; 09/2008 50% proximal and 40% mid LAD; 25% circumflex; 30% RCA; mild global LV dysfunction with EF of 45%. No aortic stenosis.  . Borderline hypertension    Normal CMet in 2011  . Cancer of larynx ([place])    laryngectomy in 1988; postoperative radiation therapy  . Carotid stenosis   . 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 Degenerative joint disease    s/p bilateral TKR  . GERD (gastroesophageal reflux disease)   . Hyperlipidemia    Lipid profile in 04/2010:115, 98, 43, 52.  Marland Kitchen Hypothyroidism   . Mild aortic stenosis    not documented at catheterization; verified by echo in 2011  . Mitral regurgitation   . Mobitz (type) II atrioventricular block    With bradycardia; [employer]tronic pacemaker implanted in 09/2008  . Peripheral vascular disease ([place])    With a 70% innominate artery stenosis and nonobstructive carotid stenosis  . Skin cancer   . Small bowel obstruction   . Tobacco abuse, in remission    Remote  . Weight loss  50 pounds between 1991 and 2011    Past Surgical History:  Procedure Laterality Date  . APPENDECTOMY  1973  . CATARACT EXTRACTION, BILATERAL    . DECOMPRESSION FACIAL NERVE     Right median  . INSERT / REPLACE / REMOVE PACEMAKER    . KNEE ARTHROSCOPY     Left  . LARYNGECTOMY  1988   S/P laryngectomy and radiation therapy  . PACEMAKER INSERTION    . TOTAL  KNEE ARTHROPLASTY     Bilateral, 19 years ago     reports that he has quit smoking. His smoking use included Cigarettes. He quit smokeless tobacco use about 33 years ago. He reports that he does not drink alcohol or use drugs.  No Known Allergies  Family History  Problem Relation Age of Onset  . Stroke Mother   . Leukemia Father   . Stroke Other   . Diabetes Other   . Colon cancer Neg Hx   . Liver disease Neg Hx   . GI problems Neg Hx      Prior to Admission medications   Medication Sig Start Date End Date Taking? Authorizing Provider  Ascorbic Acid (VITAMIN C PO) Take 1 tablet by mouth daily.    Historical Provider, MD  aspirin EC 81 MG tablet Take 81 mg by mouth daily.    Historical Provider, MD  atorvastatin (LIPITOR) 10 MG tablet Take 10 mg by mouth daily.    Historical Provider, MD  B Complex-C (B-COMPLEX WITH VITAMIN C) tablet Take 1 tablet by mouth daily.    Historical Provider, MD  Cholecalciferol (VITAMIN D PO) Take 1 tablet by mouth daily.    Historical Provider, MD  Coenzyme Q10 (CO Q 10 PO) Take 1 tablet by mouth daily.     Historical Provider, MD  Cyanocobalamin (VITAMIN B-12 PO) Take 1 tablet by mouth daily.    Historical Provider, MD  diclofenac sodium (VOLTAREN) 1 % GEL Apply 2 g topically daily as needed (pain).    Historical Provider, MD  finasteride (PROSCAR) 5 MG tablet Take 5 mg by mouth at bedtime.    Historical Provider, MD  guaifenesin (MUCUS RELIEF) 400 MG TABS tablet Take 400 mg by mouth every 4 (four) hours as needed (for mucus).    Historical Provider, MD  HYDROcodone-acetaminophen (NORCO/VICODIN) 5-325 MG tablet Take 2 tablets by mouth every 4 (four) hours as needed. 12/24/15   [name], MD  levothyroxine (SYNTHROID, LEVOTHROID) 175 MCG tablet Take 137 mcg by mouth every evening.     Historical Provider, MD  magnesium oxide (MAG-OX) 400 MG tablet Take 400 mg by mouth 2 (two) times daily.    Historical Provider, MD  Melatonin 10 MG CAPS Take 1 capsule  by mouth at bedtime.     Historical Provider, MD  pantoprazole (PROTONIX) 40 MG tablet Take 40 mg by mouth 2 (two) times daily.    Historical Provider, MD  potassium chloride (K-DUR) 10 MEQ tablet Take 1 tablet (10 mEq total) by mouth 2 (two) times daily. 01/23/16   Asencion Noble, MD  Resveratrol 250 MG CAPS Take 1 capsule by mouth 2 (two) times daily.    Historical Provider, MD  ropinirole (REQUIP) 5 MG tablet Take 5 mg by mouth at bedtime.    Historical Provider, MD  sacubitril-valsartan (ENTRESTO) 24-26 MG Take 1 tablet by mouth 2 (two) times daily. 01/23/16   Asencion Noble, MD  Tamsulosin HCl (FLOMAX) 0.4 MG CAPS Take 0.4 mg by mouth 2 (two) times daily.  Historical Provider, MD  torsemide (DEMADEX) 20 MG tablet Take 1 tablet (20 mg total) by mouth 2 (two) times daily. 01/23/16   Asencion Noble, MD    Physical Exam: Vitals:   08/18/16 2336 08/19/16 0000 08/19/16 0030 08/19/16 0100  BP:  106/56 113/65 123/70  Pulse:  60 63 64  Resp:  13 16 15   Temp:      TempSrc:      SpO2: 97% 93% 96% 96%  Weight:      Height:       Constitutional: NAD, calm, comfortable Vitals:   08/18/16 2336 08/19/16 0000 08/19/16 0030 08/19/16 0100  BP:  106/56 113/65 123/70  Pulse:  60 63 64  Resp:  13 16 15   Temp:      TempSrc:      SpO2: 97% 93% 96% 96%  Weight:      Height:       Eyes: PERRL, lids and conjunctivae normal ENMT: Mucous membranes are moist. Posterior pharynx clear of any exudate or lesions.Normal dentition.  Neck: normal, supple, no masses, no thyromegaly Respiratory: clear to auscultation bilaterally, no wheezing, no crackles. Normal respiratory effort. No accessory muscle use.  Cardiovascular: Regular rate and rhythm, no murmurs / rubs / gallops. No extremity edema. 2+ pedal pulses. No carotid bruits.  Abdomen: no tenderness, no masses palpated. No hepatosplenomegaly. Bowel sounds positive.  Musculoskeletal: no clubbing / cyanosis. No joint deformity upper and lower extremities. Good ROM, no  contractures. Normal muscle tone.  Skin: no rashes, lesions, ulcers. No induration Neurologic: CN 2-12 grossly intact. Sensation intact, DTR normal. Strength 5/5 in all 4.  Psychiatric: Normal judgment and insight. Alert and oriented x 3. Normal mood.    Labs on Admission: I have personally reviewed following labs and imaging studies CBC:  Recent Labs Lab 08/18/16 2317  WBC 6.2  NEUTROABS 4.4  HGB 10.6*  HCT 30.9*  MCV 99.4  PLT 123456   Basic Metabolic Panel:  Recent Labs Lab 08/18/16 2317  NA 125*  K 4.4  CL 95*  CO2 23  GLUCOSE 95  BUN 22*  CREATININE 0.78  CALCIUM 8.6*   GFR: Estimated Creatinine Clearance: 61.8 mL/min (by C-G formula based on SCr of 0.78 mg/dL). Liver Function Tests:  Recent Labs Lab 08/18/16 2317  AST 27  ALT 23  ALKPHOS 90  BILITOT 0.6  PROT 6.7  ALBUMIN 3.5   Cardiac Enzymes:  Recent Labs Lab 08/18/16 2317  TROPONINI <0.03   Urine analysis:    Component Value Date/Time   COLORURINE YELLOW 11/05/2015 Gross 11/05/2015 0925   LABSPEC 1.010 11/05/2015 0925   PHURINE 6.0 11/05/2015 0925   GLUCOSEU NEGATIVE 11/05/2015 0925   HGBUR TRACE (A) 11/05/2015 0925   BILIRUBINUR NEGATIVE 11/05/2015 0925   KETONESUR NEGATIVE 11/05/2015 0925   PROTEINUR TRACE (A) 11/05/2015 0925   UROBILINOGEN 0.2 02/10/2014 1720   NITRITE NEGATIVE 11/05/2015 0925   LEUKOCYTESUR NEGATIVE 11/05/2015 0925   Radiological Exams on Admission: Dg Chest Port 1 View  Result Date: 08/18/2016 CLINICAL DATA:  Shortness of breath for 2 days EXAM: PORTABLE CHEST 1 VIEW COMPARISON:  01/17/2016 FINDINGS: Portable view chest demonstrates left-sided dual lead pacemaker with leads projecting over right atrium and right ventricle. There are bilateral pleural effusions. There is mild to moderate cardiomegaly with central vascular congestion and diffuse interstitial and alveolar opacities suspicious for pulmonary edema. Unable to exclude bibasilar  pneumonia. No pneumothorax. IMPRESSION: 1. Cardiomegaly with central vascular congestion and moderate interstitial  and alveolar edema 2. Small bilateral effusions. Unable to exclude bibasilar atelectasis or pneumonia Electronically Signed   By: Donavan Foil M.D.   On: 08/18/2016 23:43    EKG: Independently reviewed.  Assessment/Plan Principal Problem:   Acute on chronic systolic congestive heart failure (HCC) Active Problems:   Malignant neoplasm of larynx (HCC)   Hypothyroidism   Hyperlipidemia   Aortic valve disorder   PACEMAKER, PERMANENT   CHF (congestive heart failure) (HCC)    PLAN:   Acute on chronic combined CHF:  Will continue with IV lasix at 4Omg BID.  Follow Cr, I/O and fluid status carefully.  Mobitz II, s/p ppm:  It was check several months ago, and was functioning adequately.  Hypothyroidism:  He was known to have been non compliant with his supplement.  Will repeat TSH.  If elevated, would resume with his same dose for now.  HLD:  Continue with meds.   DVT prophylaxis: Hep SQ.  Code Status: FULL CODE.  Family Communication: grand son. Disposition Plan: Home when appropriate.  Consults called: None.  Admission status: OBS>    Danis Pembleton MD FACP. [facility]  If 7PM-7AM, please contact night-coverage [email or web address] Password [email or web address]  08/19/2016, 1:07 AM

## 2016-08-19 NOTE — Progress Notes (Signed)
Pt c/o lower abd pain and inability to void. Bladder scan revealed greater than 822mL's of urine. Dr. Luan Pulling notified and order received to insert foley catheter.

## 2016-08-19 NOTE — Progress Notes (Signed)
Subjective: He was admitted yesterday with congestive heart failure. He indicates that he feels better. He speaks with an artificial voice box but mostly he gestures. He says he feels like he needs to urinate and I told him he has a catheter. He has no other new complaints.  Objective: Vital signs in last 24 hours: Temp:  [98 F (36.7 C)-98.7 F (37.1 C)] 98 F (36.7 C) (12/12 0649) Pulse Rate:  [60-94] 94 (12/12 0649) Resp:  [13-21] 21 (12/12 0649) BP: (105-131)/(55-75) 124/55 (12/12 0649) SpO2:  [86 %-97 %] 97 % (12/12 0649) FiO2 (%):  [35 %] 35 % (12/12 0211) Weight:  [72 kg (158 lb 11.7 oz)-76.3 kg (168 lb 3.2 oz)] 72 kg (158 lb 11.7 oz) (12/12 5364) Weight change:  Last BM Date: 08/17/16  Intake/Output from previous day: 12/11 0701 - 12/12 0700 In: -  Out: 1550 [Urine:1550]  PHYSICAL EXAM General appearance: alert, cooperative and mild distress Resp: rales bibasilar Cardio: His heart is regular now. I don't hear a gallop. GI: soft, non-tender; bowel sounds normal; no masses,  no organomegaly Extremities: extremities normal, atraumatic, no cyanosis or edema Eyes: Pupils react ears nose mouth and throat: He has permanent tracheostomy. Mucous membranes are slightly dry. He has dentures. Skin: Warm and dry  Lab Results:  Results for orders placed or performed during the hospital encounter of 08/18/16 (from the past 48 hour(s))  CBC with Differential     Status: Abnormal   Collection Time: 08/18/16 11:17 PM  Result Value Ref Range   WBC 6.2 4.0 - 10.5 K/uL   RBC 3.11 (L) 4.22 - 5.81 MIL/uL   Hemoglobin 10.6 (L) 13.0 - 17.0 g/dL   HCT 30.9 (L) 39.0 - 52.0 %   MCV 99.4 78.0 - 100.0 fL   MCH 34.1 (H) 26.0 - 34.0 pg   MCHC 34.3 30.0 - 36.0 g/dL   RDW 12.7 11.5 - 15.5 %   Platelets 225 150 - 400 K/uL   Neutrophils Relative % 71 %   Neutro Abs 4.4 1.7 - 7.7 K/uL   Lymphocytes Relative 13 %   Lymphs Abs 0.8 0.7 - 4.0 K/uL   Monocytes Relative 10 %   Monocytes Absolute 0.6  0.1 - 1.0 K/uL   Eosinophils Relative 5 %   Eosinophils Absolute 0.3 0.0 - 0.7 K/uL   Basophils Relative 1 %   Basophils Absolute 0.1 0.0 - 0.1 K/uL  Troponin I     Status: None   Collection Time: 08/18/16 11:17 PM  Result Value Ref Range   Troponin I <0.03 <0.03 ng/mL  Brain natriuretic peptide     Status: Abnormal   Collection Time: 08/18/16 11:17 PM  Result Value Ref Range   B Natriuretic Peptide 1,635.0 (H) 0.0 - 100.0 pg/mL  Comprehensive metabolic panel     Status: Abnormal   Collection Time: 08/18/16 11:17 PM  Result Value Ref Range   Sodium 125 (L) 135 - 145 mmol/L   Potassium 4.4 3.5 - 5.1 mmol/L   Chloride 95 (L) 101 - 111 mmol/L   CO2 23 22 - 32 mmol/L   Glucose, Bld 95 65 - 99 mg/dL   BUN 22 (H) 6 - 20 mg/dL   Creatinine, Ser 0.78 0.61 - 1.24 mg/dL   Calcium 8.6 (L) 8.9 - 10.3 mg/dL   Total Protein 6.7 6.5 - 8.1 g/dL   Albumin 3.5 3.5 - 5.0 g/dL   AST 27 15 - 41 U/L   ALT 23 17 - 63  U/L   Alkaline Phosphatase 90 38 - 126 U/L   Total Bilirubin 0.6 0.3 - 1.2 mg/dL   GFR calc non Af Amer >60 >60 mL/min   GFR calc Af Amer >60 >60 mL/min    Comment: (NOTE) The eGFR has been calculated using the CKD EPI equation. This calculation has not been validated in all clinical situations. eGFR's persistently <60 mL/min signify possible Chronic Kidney Disease.    Anion gap 7 5 - 15  TSH     Status: Abnormal   Collection Time: 08/18/16 11:17 PM  Result Value Ref Range   TSH 12.028 (H) 0.350 - 4.500 uIU/mL    Comment: Performed by a 3rd Generation assay with a functional sensitivity of <=0.01 uIU/mL.    ABGS No results for input(s): PHART, PO2ART, TCO2, HCO3 in the last 72 hours.  Invalid input(s): PCO2 CULTURES No results found for this or any previous visit (from the past 240 hour(s)). Studies/Results: Dg Chest Port 1 View  Result Date: 08/18/2016 CLINICAL DATA:  Shortness of breath for 2 days EXAM: PORTABLE CHEST 1 VIEW COMPARISON:  01/17/2016 FINDINGS: Portable  view chest demonstrates left-sided dual lead pacemaker with leads projecting over right atrium and right ventricle. There are bilateral pleural effusions. There is mild to moderate cardiomegaly with central vascular congestion and diffuse interstitial and alveolar opacities suspicious for pulmonary edema. Unable to exclude bibasilar pneumonia. No pneumothorax. IMPRESSION: 1. Cardiomegaly with central vascular congestion and moderate interstitial and alveolar edema 2. Small bilateral effusions. Unable to exclude bibasilar atelectasis or pneumonia Electronically Signed   By: Donavan Foil M.D.   On: 08/18/2016 23:43    Medications:  Prior to Admission:  Prescriptions Prior to Admission  Medication Sig Dispense Refill Last Dose  . Ascorbic Acid (VITAMIN C PO) Take 1 tablet by mouth daily.   01/17/2016 at Unknown time  . aspirin EC 81 MG tablet Take 81 mg by mouth daily.   01/17/2016 at Unknown time  . atorvastatin (LIPITOR) 10 MG tablet Take 10 mg by mouth daily.   01/17/2016 at Unknown time  . B Complex-C (B-COMPLEX WITH VITAMIN C) tablet Take 1 tablet by mouth daily.   Past Week at Unknown time  . Cholecalciferol (VITAMIN D PO) Take 1 tablet by mouth daily.   01/17/2016 at Unknown time  . Coenzyme Q10 (CO Q 10 PO) Take 1 tablet by mouth daily.    01/17/2016 at Unknown time  . Cyanocobalamin (VITAMIN B-12 PO) Take 1 tablet by mouth daily.   01/17/2016 at Unknown time  . diclofenac sodium (VOLTAREN) 1 % GEL Apply 2 g topically daily as needed (pain).   01/17/2016 at Unknown time  . finasteride (PROSCAR) 5 MG tablet Take 5 mg by mouth at bedtime.   01/17/2016 at Unknown time  . guaifenesin (MUCUS RELIEF) 400 MG TABS tablet Take 400 mg by mouth every 4 (four) hours as needed (for mucus).   01/17/2016 at Unknown time  . HYDROcodone-acetaminophen (NORCO/VICODIN) 5-325 MG tablet Take 2 tablets by mouth every 4 (four) hours as needed. 10 tablet 0 01/17/2016 at Unknown time  . levothyroxine (SYNTHROID, LEVOTHROID) 175  MCG tablet Take 137 mcg by mouth every evening.    01/17/2016 at Unknown time  . magnesium oxide (MAG-OX) 400 MG tablet Take 400 mg by mouth 2 (two) times daily.   01/17/2016 at Unknown time  . Melatonin 10 MG CAPS Take 1 capsule by mouth at bedtime.    01/17/2016 at Unknown time  . pantoprazole (PROTONIX)  40 MG tablet Take 40 mg by mouth 2 (two) times daily.   01/17/2016 at Unknown time  . potassium chloride (K-DUR) 10 MEQ tablet Take 1 tablet (10 mEq total) by mouth 2 (two) times daily. 60 tablet 12   . Resveratrol 250 MG CAPS Take 1 capsule by mouth 2 (two) times daily.   01/17/2016 at Unknown time  . ropinirole (REQUIP) 5 MG tablet Take 5 mg by mouth at bedtime.   01/17/2016 at Unknown time  . sacubitril-valsartan (ENTRESTO) 24-26 MG Take 1 tablet by mouth 2 (two) times daily. 60 tablet 12   . Tamsulosin HCl (FLOMAX) 0.4 MG CAPS Take 0.4 mg by mouth 2 (two) times daily.    01/17/2016 at Unknown time  . torsemide (DEMADEX) 20 MG tablet Take 1 tablet (20 mg total) by mouth 2 (two) times daily. 60 tablet 12    Scheduled: . aspirin EC  81 mg Oral Daily  . atorvastatin  10 mg Oral Daily  . B-complex with vitamin C  1 tablet Oral Daily  . cholecalciferol  400 Units Oral Daily  . finasteride  5 mg Oral QHS  . furosemide  40 mg Intravenous BID  . heparin  5,000 Units Subcutaneous Q8H  . levothyroxine  137 mcg Oral QPM  . magnesium oxide  400 mg Oral BID  . pantoprazole  40 mg Oral BID  . potassium chloride  10 mEq Oral BID  . ropinirole  5 mg Oral QHS  . sacubitril-valsartan  1 tablet Oral BID  . tamsulosin  0.4 mg Oral BID   Continuous:  [id number]  Assesment: He was admitted with acute on chronic systolic heart failure. He seems a little better. He is receiving IV Lasix. He also has a history of laryngeal cancer status post laryngectomy. He has hypothyroidism on replacement. He has restless legs on ropinirole Principal Problem:   Acute on chronic systolic congestive heart  failure (HCC) Active Problems:   Malignant neoplasm of larynx (HCC)   Hypothyroidism   Hyperlipidemia   Aortic valve disorder   PACEMAKER, PERMANENT   CHF (congestive heart failure) ([place])    Plan: Continue current treatments. No change in meds. Continue IV Lasix.    LOS: 0 days   Kelby Adell L 08/19/2016, 8:44 AM

## 2016-08-20 LAB — BASIC METABOLIC PANEL
Anion gap: 8 (ref 5–15)
BUN: 20 mg/dL (ref 6–20)
CALCIUM: 8.5 mg/dL — AB (ref 8.9–10.3)
CO2: 28 mmol/L (ref 22–32)
CREATININE: 0.78 mg/dL (ref 0.61–1.24)
Chloride: 91 mmol/L — ABNORMAL LOW (ref 101–111)
GFR calc Af Amer: >60 mL/min (ref >60)
GFR calc non Af Amer: >60 mL/min (ref >60)
GLUCOSE: 109 mg/dL — AB (ref 65–99)
Potassium: 3.9 mmol/L (ref 3.5–5.1)
Sodium: 127 mmol/L — ABNORMAL LOW (ref 135–145)

## 2016-08-20 MED ORDER — DIPHENHYDRAMINE HCL 25 MG PO CAPS
25.0000 mg | ORAL_CAPSULE | Freq: Every evening | ORAL | Status: DC | PRN
  Administered 2016-08-20: 25 mg via ORAL
  Filled 2016-08-20: qty 1

## 2016-08-20 MED ORDER — ACETAMINOPHEN 325 MG PO TABS
325.0000 mg | ORAL_TABLET | Freq: Every evening | ORAL | Status: DC | PRN
  Administered 2016-08-20: 325 mg via ORAL
  Filled 2016-08-20: qty 1

## 2016-08-20 MED ORDER — FINASTERIDE 5 MG PO TABS
ORAL_TABLET | ORAL | Status: AC
  Filled 2016-08-20: qty 1

## 2016-08-20 NOTE — Care Management Note (Addendum)
Case Management Note  Patient Details  Name: Andre Holder MRN: [id number] Date of Birth: 11-12-1922  Subjective/Objective:        Patient adm with CHF, lives with wife. Spoke with wife over the phone, she states she takes care of patient. She reports a RN comes into the home once a month, she is unsure if this is [facility] or [employer]. Patient has had [facility] in the past.             Action/Plan: Will refer out to [facility] regarding once a month nurse. Will follow for needs. At this time wife states, no needs at home.   Later entry: [facility] not active with patient, patient has nurse visits monthly from [facility]. No CM needs known.  Expected Discharge Date:     08/21/2016             Expected Discharge Plan:  Home/Self Care  In-House Referral:  NA  Discharge planning Services  CM Consult  Post Acute Care Choice:    Choice offered to:     DME Arranged:    DME Agency:     HH Arranged:    HH Agency:     Status of Service:  In process, will continue to follow  If discussed at Long Length of Stay Meetings, dates discussed:    Additional Comments:  Terrez Ander, Chauncey Reading, RN 08/20/2016, 11:36 AM

## 2016-08-20 NOTE — Care Management Important Message (Signed)
Important Message  Patient Details  Name: Andre Holder MRN: [id number] Date of Birth: October 13, 1922   Medicare Important Message Given:  Yes    Roni Friberg, Chauncey Reading, RN 08/20/2016, 11:40 AM

## 2016-08-20 NOTE — Progress Notes (Signed)
Subjective: He feels his breathing has improved. He has diuresed 2450 ML's over the past day.  Objective: Vital signs in last 24 hours: Vitals:   08/19/16 2024 08/19/16 2315 08/20/16 0502 08/20/16 0738  BP: (!) 100/50  (!) 120/52   Pulse: 73  63 66  Resp: 20  18 14   Temp: 97.8 F (36.6 C)  97.7 F (36.5 C)   TempSrc: Oral  Oral   SpO2: 93% 95% 97% 92%  Weight:   159 lb 14.4 oz (72.5 kg)   Height:       Weight change: -1.6 oz (-0.045 kg)  Intake/Output Summary (Last 24 hours) at 08/20/16 0745 Last data filed at 08/20/16 0000  Gross per 24 hour  Intake                0 ml  Output             2450 ml  Net            -2450 ml    Physical Exam: Breathing comfortably. No JVD. Lungs reveal basilar rales. Heart regular with a grade 2 systolic murmur. Abdomen soft and nontender. Extremities reveal no edema.  Lab Results:    Results for orders placed or performed during the hospital encounter of 08/18/16 (from the past 24 hour(s))  Basic metabolic panel     Status: Abnormal   Collection Time: 08/20/16  5:03 AM  Result Value Ref Range   Sodium 127 (L) 135 - 145 mmol/L   Potassium 3.9 3.5 - 5.1 mmol/L   Chloride 91 (L) 101 - 111 mmol/L   CO2 28 22 - 32 mmol/L   Glucose, Bld 109 (H) 65 - 99 mg/dL   BUN 20 6 - 20 mg/dL   Creatinine, Ser 0.78 0.61 - 1.24 mg/dL   Calcium 8.5 (L) 8.9 - 10.3 mg/dL   GFR calc non Af Amer >60 >60 mL/min   GFR calc Af Amer >60 >60 mL/min   Anion gap 8 5 - 15     ABGS No results for input(s): PHART, PO2ART, TCO2, HCO3 in the last 72 hours.  Invalid input(s): PCO2 CULTURES No results found for this or any previous visit (from the past 240 hour(s)). Studies/Results: Dg Chest Port 1 View  Result Date: 08/18/2016 CLINICAL DATA:  Shortness of breath for 2 days EXAM: PORTABLE CHEST 1 VIEW COMPARISON:  01/17/2016 FINDINGS: Portable view chest demonstrates left-sided dual lead pacemaker with leads projecting over right atrium and right ventricle.  There are bilateral pleural effusions. There is mild to moderate cardiomegaly with central vascular congestion and diffuse interstitial and alveolar opacities suspicious for pulmonary edema. Unable to exclude bibasilar pneumonia. No pneumothorax. IMPRESSION: 1. Cardiomegaly with central vascular congestion and moderate interstitial and alveolar edema 2. Small bilateral effusions. Unable to exclude bibasilar atelectasis or pneumonia Electronically Signed   By: Donavan Foil M.D.   On: 08/18/2016 23:43   Micro Results: No results found for this or any previous visit (from the past 240 hour(s)). Studies/Results: Dg Chest Port 1 View  Result Date: 08/18/2016 CLINICAL DATA:  Shortness of breath for 2 days EXAM: PORTABLE CHEST 1 VIEW COMPARISON:  01/17/2016 FINDINGS: Portable view chest demonstrates left-sided dual lead pacemaker with leads projecting over right atrium and right ventricle. There are bilateral pleural effusions. There is mild to moderate cardiomegaly with central vascular congestion and diffuse interstitial and alveolar opacities suspicious for pulmonary edema. Unable to exclude bibasilar pneumonia. No pneumothorax. IMPRESSION: 1. Cardiomegaly with central vascular  congestion and moderate interstitial and alveolar edema 2. Small bilateral effusions. Unable to exclude bibasilar atelectasis or pneumonia Electronically Signed   By: Donavan Foil M.D.   On: 08/18/2016 23:43   Medications:  I have reviewed the patient's current medications Scheduled Meds: . aspirin EC  81 mg Oral Daily  . atorvastatin  10 mg Oral Daily  . B-complex with vitamin C  1 tablet Oral Daily  . cholecalciferol  400 Units Oral Daily  . finasteride  5 mg Oral QHS  . furosemide  40 mg Intravenous BID  . heparin  5,000 Units Subcutaneous Q8H  . levothyroxine  137 mcg Oral QPM  . magnesium oxide  400 mg Oral BID  . pantoprazole  40 mg Oral BID  . potassium chloride  10 mEq Oral BID  . ropinirole  5 mg Oral QHS  .  tamsulosin  0.4 mg Oral BID   Continuous Infusions: PRN Meds:.HYDROcodone-acetaminophen   Assessment/Plan: #1. Acute on chronic systolic heart failure. Continue IV Lasix. #2. Hyponatremia. Improved to 127. #3. Status post pacemaker. #4. Hypothyroidism. Previously euthyroid on his current dose. #5. Urinary retention. Continue Foley catheter.  [name] has previously been discontinued because of dizziness and hypotension. Principal Problem:   Acute on chronic systolic congestive heart failure (HCC) Active Problems:   Malignant neoplasm of larynx (HCC)   Hypothyroidism   Hyperlipidemia   Aortic valve disorder   PACEMAKER, PERMANENT   CHF (congestive heart failure) ([place])     LOS: 1 day   Yashika Mask 08/20/2016, 7:45 AM

## 2016-08-21 LAB — BASIC METABOLIC PANEL
ANION GAP: 8 (ref 5–15)
BUN: 18 mg/dL (ref 6–20)
CO2: 29 mmol/L (ref 22–32)
Calcium: 8.8 mg/dL — ABNORMAL LOW (ref 8.9–10.3)
Chloride: 89 mmol/L — ABNORMAL LOW (ref 101–111)
Creatinine, Ser: 0.78 mg/dL (ref 0.61–1.24)
GLUCOSE: 107 mg/dL — AB (ref 65–99)
POTASSIUM: 4.4 mmol/L (ref 3.5–5.1)
Sodium: 126 mmol/L — ABNORMAL LOW (ref 135–145)

## 2016-08-21 MED ORDER — TORSEMIDE 20 MG PO TABS: 20.0000 mg | ORAL_TABLET | Freq: Every day | ORAL | 12 refills | Status: DC

## 2016-08-21 NOTE — Discharge Summary (Signed)
Physician Discharge Summary  Andre Holder [id number] DOB: Nov 16, 1922 DOA: 08/18/2016   Admit date: 08/18/2016 Discharge date: 08/21/2016  Discharge Diagnoses:  Principal Problem:   Acute on chronic systolic congestive heart failure [facility]) Active Problems:   Malignant neoplasm of larynx (HCC)   Hypothyroidism   Hyperlipidemia   Aortic valve disorder   PACEMAKER, PERMANENT   CHF (congestive heart failure) ([place])    Wt Readings from Last 3 Encounters:  08/20/16 159 lb 14.4 oz (72.5 kg)  03/24/16 155 lb (70.3 kg)  01/23/16 148 lb 2.7 oz (67.2 kg)     Hospital Course:  This patient is a 82 year old male who was hospitalized with acute on chronic systolic heart failure. He presented with shortness of breath and orthopnea. Chest x-ray was consistent with pulmonary edema. B natruretic peptide was elevated at 1635. BUN and creatinine were at baseline at 22 and 0.78. He was treated with intravenous Lasix. He diuresed well. His symptoms improved. He was hyponatremic with a serum sodium of 125 which improved to 127. Troponin I was normal at less than 0.03. Hemoglobin is stable at 10.6.  He was much improved and stable for discharge on 12/14. He will be seen in follow-up in my office in one week. His Demadex dose will be increased to 20 mg daily.  Exam on discharge reveals clear lungs. Heart is regular with a grade 2 systolic murmur which is unchanged. His extremities reveal no edema. He is breathing comfortably on room air.  He will continue his current dose of Synthroid and will have a follow-up TSH.   Discharge Instructions     Medication List    TAKE these medications   aspirin EC 81 MG tablet Take 81 mg by mouth daily.   atorvastatin 10 MG tablet Commonly known as:  LIPITOR Take 10 mg by mouth daily.   CO Q 10 PO Take 1 tablet by mouth daily.   finasteride 5 MG tablet Commonly known as:  PROSCAR Take 5 mg by mouth at bedtime.   HYDROcodone-acetaminophen 5-325 MG  tablet Commonly known as:  NORCO/VICODIN Take 2 tablets by mouth every 4 (four) hours as needed.   levothyroxine 200 MCG tablet Commonly known as:  SYNTHROID, LEVOTHROID Take 200 mcg by mouth daily before breakfast.   magnesium oxide 400 MG tablet Commonly known as:  MAG-OX Take 400 mg by mouth 2 (two) times daily.   Melatonin 10 MG Caps Take 1 capsule by mouth at bedtime.   MUCUS RELIEF 400 MG Tabs tablet Generic drug:  guaifenesin Take 400 mg by mouth every 4 (four) hours as needed (for mucus).   pantoprazole 40 MG tablet Commonly known as:  PROTONIX Take 40 mg by mouth 2 (two) times daily.   potassium chloride 10 MEQ tablet Commonly known as:  K-DUR Take 1 tablet (10 mEq total) by mouth 2 (two) times daily. What changed:  when to take this   Resveratrol 250 MG Caps Take 1 capsule by mouth 2 (two) times daily.   ropinirole 5 MG tablet Commonly known as:  REQUIP Take 5 mg by mouth at bedtime.   tamsulosin 0.4 MG Caps capsule Commonly known as:  FLOMAX Take 0.4 mg by mouth 2 (two) times daily.   torsemide 20 MG tablet Commonly known as:  DEMADEX Take 1 tablet (20 mg total) by mouth daily. What changed:  when to take this   VITAMIN B-12 PO Take 1 tablet by mouth daily.   VITAMIN C PO Take 1 tablet by  mouth daily.   VITAMIN D PO Take 1 tablet by mouth daily.   VOLTAREN 1 % Gel Generic drug:  diclofenac sodium Apply 2 g topically daily as needed (pain).        Andre Holder 08/21/2016

## 2016-08-23 ENCOUNTER — Encounter (HOSPITAL_COMMUNITY): Payer: Self-pay | Admitting: Emergency Medicine

## 2016-08-23 ENCOUNTER — Emergency Department (HOSPITAL_COMMUNITY)
Admission: EM | Admit: 2016-08-23 | Discharge: 2016-08-23 | Disposition: A | Discharge status: Home / Self Care | Payer: Medicare HMO | Attending: Emergency Medicine | Admitting: Emergency Medicine

## 2016-08-23 DIAGNOSIS — Z87891 Personal history of nicotine dependence: Secondary | ICD-10-CM | POA: Insufficient documentation

## 2016-08-23 DIAGNOSIS — I110 Hypertensive heart disease with heart failure: Secondary | ICD-10-CM | POA: Insufficient documentation

## 2016-08-23 DIAGNOSIS — T83091A Other mechanical complication of indwelling urethral catheter, initial encounter: Secondary | ICD-10-CM | POA: Insufficient documentation

## 2016-08-23 DIAGNOSIS — Y828 Other medical devices associated with adverse incidents: Secondary | ICD-10-CM | POA: Diagnosis not present

## 2016-08-23 DIAGNOSIS — T839XXA Unspecified complication of genitourinary prosthetic device, implant and graft, initial encounter: Secondary | ICD-10-CM

## 2016-08-23 DIAGNOSIS — Z8521 Personal history of malignant neoplasm of larynx: Secondary | ICD-10-CM | POA: Diagnosis not present

## 2016-08-23 DIAGNOSIS — Z7982 Long term (current) use of aspirin: Secondary | ICD-10-CM | POA: Diagnosis not present

## 2016-08-23 DIAGNOSIS — Z85820 Personal history of malignant melanoma of skin: Secondary | ICD-10-CM | POA: Diagnosis not present

## 2016-08-23 DIAGNOSIS — I5023 Acute on chronic systolic (congestive) heart failure: Secondary | ICD-10-CM | POA: Insufficient documentation

## 2016-08-23 DIAGNOSIS — E039 Hypothyroidism, unspecified: Secondary | ICD-10-CM | POA: Diagnosis not present

## 2016-08-23 DIAGNOSIS — Z950 Presence of cardiac pacemaker: Secondary | ICD-10-CM | POA: Diagnosis not present

## 2016-08-23 DIAGNOSIS — N3001 Acute cystitis with hematuria: Secondary | ICD-10-CM | POA: Diagnosis not present

## 2016-08-23 DIAGNOSIS — Z79899 Other long term (current) drug therapy: Secondary | ICD-10-CM | POA: Insufficient documentation

## 2016-08-23 DIAGNOSIS — R319 Hematuria, unspecified: Secondary | ICD-10-CM | POA: Diagnosis present

## 2016-08-23 LAB — URINALYSIS, ROUTINE W REFLEX MICROSCOPIC
Bilirubin Urine: NEGATIVE
Glucose, UA: NEGATIVE mg/dL
Ketones, ur: NEGATIVE mg/dL
Nitrite: NEGATIVE
PROTEIN: 100 mg/dL — AB
SPECIFIC GRAVITY, URINE: 1.012 (ref 1.005–1.030)
pH: 8 (ref 5.0–8.0)

## 2016-08-23 MED ORDER — SULFAMETHOXAZOLE-TRIMETHOPRIM 800-160 MG PO TABS: 1.0000 | ORAL_TABLET | Freq: Two times a day (BID) | ORAL | 0 refills | Status: AC

## 2016-08-23 NOTE — ED Notes (Signed)
Urinal placed at bedside. Per EDP, urine output needed to further investigate if Foley is necessary. Pt given coffee per request.

## 2016-08-23 NOTE — ED Notes (Signed)
Family refused d/c vitals.

## 2016-08-23 NOTE — ED Provider Notes (Signed)
[facility] DEPT Provider Note   CSN: [id number] Arrival date & time: 08/23/16  [id number]  By signing my name below, I, Andre Holder, attest that this documentation has been prepared under the direction and in the presence of Courtney Paris, MD. Electronically Signed: Sonum Holder, Education administrator. 08/23/16. 8:31 AM.  History   Chief Complaint Chief Complaint  Patient presents with  . Foley Cath Problem    The history is provided by the patient, the spouse and medical records. No language interpreter was used.     HPI Comments: DECOTA RUDGE is a 82 y.o. male who presents to the Emergency Department complaining of a foley catheter problem today. Patient states he had a foley placed earlier this week when he was hospitalized for CHF and was to have it removed by PCP next week. Per triage note, patient attempted to remove the foley which caused him stinging, soreness, and a small amount of bleeding. He denies dysuria, dark/cloudy urine, fever, chills, constipation, diarrhea, abdominal pain, leg pain, or new back pain.   Past Medical History:  Diagnosis Date  . Adrenal hyperplasia ([place])    Stable on serial imaging  . Anemia    minimal in 2011 with hemoglobin of 12.2 and high normal MCV  . Arteriosclerotic cardiovascular disease (ASCVD)    Nonobstructive; 09/2008 50% proximal and 40% mid LAD; 25% circumflex; 30% RCA; mild global LV dysfunction with EF of 45%. No aortic stenosis.  . Borderline hypertension    Normal CMet in 2011  . Cancer of larynx ([place])    laryngectomy in 1988; postoperative radiation therapy  . Carotid stenosis   . 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 Degenerative joint disease    s/p bilateral TKR  . GERD (gastroesophageal reflux disease)   . Hyperlipidemia    Lipid profile in 04/2010:115, 98, 43, 52.  Marland Kitchen Hypothyroidism   . Mild aortic stenosis    not documented at catheterization; verified by echo in 2011  . Mitral  regurgitation   . Mobitz (type) II atrioventricular block    With bradycardia; Medtronic pacemaker implanted in 09/2008  . Peripheral vascular disease ([place])    With a 70% innominate artery stenosis and nonobstructive carotid stenosis  . Skin cancer   . Small bowel obstruction   . Tobacco abuse, in remission    Remote  . Weight loss    50 pounds between 1991 and 2011    Patient Active Problem List   Diagnosis Date Noted  . Shortness of breath 01/18/2016  . Edema 01/18/2016  . Protein calorie malnutrition ([place]) 01/18/2016  . Acute on chronic systolic congestive heart failure ([place])   . Edema extremities   . Partial small bowel obstruction 11/05/2015  . Anemia 11/05/2015  . Malnutrition of moderate degree ([place]) 01/11/2015  . Small bowel obstruction 12/23/2014  . Chronic systolic congestive heart failure, NYHA class 2 ([place]) 12/23/2014  . SBO (small bowel obstruction) ([place]) 12/23/2014  . CHF exacerbation ([place]) 08/31/2014  . Acute on chronic systolic CHF (congestive heart failure) ([place]) 08/31/2014  . Chest pain 08/31/2014  . Dyspnea 08/31/2014  . CHF (congestive heart failure) ([place]) 08/25/2013  . Syncope 10/09/2011  . Hyponatremia 10/09/2011  . GERD 06/17/2010  . WEIGHT LOSS, ABNORMAL 06/17/2010  . ANEMIA 04/08/2010  . Mobitz type II atrioventricular block 04/08/2010  . Hyperlipidemia 10/24/2009  . PACEMAKER, PERMANENT 09/20/2009  . Malignant neoplasm of larynx ([place]) 03/07/2009  . Aortic valve disorder  03/07/2009  . CEREBROVASCULAR DISEASE 03/07/2009  . PERIPHERAL VASCULAR DISEASE 03/07/2009  . Tobacco abuse, in remission 03/07/2009  . Hypothyroidism 12/27/2008  . OSTEOARTHRITIS 12/27/2008    Past Surgical History:  Procedure Laterality Date  . APPENDECTOMY  1973  . CATARACT EXTRACTION, BILATERAL    . DECOMPRESSION FACIAL NERVE     Right median  . INSERT / REPLACE / REMOVE PACEMAKER    . KNEE ARTHROSCOPY     Left  . LARYNGECTOMY  1988   S/P laryngectomy and radiation  therapy  . PACEMAKER INSERTION    . TOTAL KNEE ARTHROPLASTY     Bilateral, 19 years ago       Home Medications    Prior to Admission medications   Medication Sig Start Date End Date Taking? Authorizing Provider  Ascorbic Acid (VITAMIN C PO) Take 1 tablet by mouth daily.    Historical Provider, MD  aspirin EC 81 MG tablet Take 81 mg by mouth daily.    Historical Provider, MD  atorvastatin (LIPITOR) 10 MG tablet Take 10 mg by mouth daily.    Historical Provider, MD  Cholecalciferol (VITAMIN D PO) Take 1 tablet by mouth daily.    Historical Provider, MD  Coenzyme Q10 (CO Q 10 PO) Take 1 tablet by mouth daily.     Historical Provider, MD  Cyanocobalamin (VITAMIN B-12 PO) Take 1 tablet by mouth daily.    Historical Provider, MD  diclofenac sodium (VOLTAREN) 1 % GEL Apply 2 g topically daily as needed (pain).    Historical Provider, MD  finasteride (PROSCAR) 5 MG tablet Take 5 mg by mouth at bedtime.    Historical Provider, MD  guaifenesin (MUCUS RELIEF) 400 MG TABS tablet Take 400 mg by mouth every 4 (four) hours as needed (for mucus).    Historical Provider, MD  HYDROcodone-acetaminophen (NORCO/VICODIN) 5-325 MG tablet Take 2 tablets by mouth every 4 (four) hours as needed. 12/24/15   [name], MD  levothyroxine (SYNTHROID, LEVOTHROID) 200 MCG tablet Take 200 mcg by mouth daily before breakfast.    Historical Provider, MD  magnesium oxide (MAG-OX) 400 MG tablet Take 400 mg by mouth 2 (two) times daily.    Historical Provider, MD  Melatonin 10 MG CAPS Take 1 capsule by mouth at bedtime.     Historical Provider, MD  pantoprazole (PROTONIX) 40 MG tablet Take 40 mg by mouth 2 (two) times daily.    Historical Provider, MD  potassium chloride (K-DUR) 10 MEQ tablet Take 1 tablet (10 mEq total) by mouth 2 (two) times daily. Patient taking differently: Take 10 mEq by mouth daily.  01/23/16   Asencion Noble, MD  Resveratrol 250 MG CAPS Take 1 capsule by mouth 2 (two) times daily.    Historical Provider,  MD  ropinirole (REQUIP) 5 MG tablet Take 5 mg by mouth at bedtime.    Historical Provider, MD  Tamsulosin HCl (FLOMAX) 0.4 MG CAPS Take 0.4 mg by mouth 2 (two) times daily.     Historical Provider, MD  torsemide (DEMADEX) 20 MG tablet Take 1 tablet (20 mg total) by mouth daily. 08/21/16   Asencion Noble, MD    Family History Family History  Problem Relation Age of Onset  . Stroke Mother   . Leukemia Father   . Stroke Other   . Diabetes Other   . Colon cancer Neg Hx   . Liver disease Neg Hx   . GI problems Neg Hx     Social History Social History  Substance  Use Topics  . Smoking status: Former Smoker    Types: Cigarettes  . Smokeless tobacco: Former Systems developer    Quit date: 09/08/1982     Comment: Quit 30 years  . Alcohol use No     Allergies   Patient has no known allergies.   Review of Systems Review of Systems  Constitutional: Negative for chills and fever.  HENT: Negative for congestion.   Respiratory: Negative for cough, chest tightness, shortness of breath, wheezing and stridor.   Cardiovascular: Positive for leg swelling (Improving). Negative for chest pain and palpitations.  Gastrointestinal: Negative for abdominal pain, diarrhea, nausea and vomiting.  Genitourinary: Positive for hematuria and penile pain. Negative for dysuria, flank pain and frequency.  Musculoskeletal: Negative for back pain, myalgias, neck pain and neck stiffness.  Skin: Negative for wound.  Neurological: Negative for light-headedness and headaches.  All other systems reviewed and are negative.    Physical Exam Updated Vital Signs BP 106/69 (BP Location: Right Arm)   Pulse 86   Temp 97.8 F (36.6 C) (Oral)   Resp 24   Ht 6\' 1"  (1.854 m)   Wt 159 lb (72.1 kg)   SpO2 97%   BMI 20.98 kg/m   Physical Exam  Constitutional: He is oriented to person, place, and time. He appears well-developed and well-nourished. No distress.  HENT:  Head: Normocephalic and atraumatic.  Right Ear: External ear  normal.  Left Ear: External ear normal.  Nose: Nose normal.  Mouth/Throat: Oropharynx is clear and moist. No oropharyngeal exudate.  Eyes: Conjunctivae and EOM are normal. Pupils are equal, round, and reactive to light.  Neck: Normal range of motion. Neck supple.  Cardiovascular: Normal rate, regular rhythm and normal heart sounds.   Pulmonary/Chest: Effort normal and breath sounds normal. No stridor. No respiratory distress. He has no wheezes. He has no rales.  Abdominal: Soft. There is no tenderness. There is no rebound and no guarding. Hernia confirmed negative in the right inguinal area and confirmed negative in the left inguinal area.  Genitourinary: Testes normal. Right testis shows no tenderness. Left testis shows no tenderness. No penile tenderness.     Genitourinary Comments: Incision to right groin, no hernia, no testicular pain. Small amount of blood at the meatus. No penile tenderness or injury.   Musculoskeletal: He exhibits no edema.  Neurological: He is alert and oriented to person, place, and time. He displays normal reflexes. No cranial nerve deficit. He exhibits normal muscle tone. Coordination normal.  Skin: Skin is warm. No rash noted. He is not diaphoretic. No erythema.     ED Treatments / Results  DIAGNOSTIC STUDIES: Oxygen Saturation is 97% on RA, adequate by my interpretation.    COORDINATION OF CARE: 8:27 AM Discussed treatment plan with pt at bedside and pt agreed to plan.   Labs (all labs ordered are listed, but only abnormal results are displayed) Labs Reviewed  URINALYSIS, ROUTINE W REFLEX MICROSCOPIC - Abnormal; Notable for the following:       Result Value   APPearance HAZY (*)    Hgb urine dipstick LARGE (*)    Protein, ur 100 (*)    Leukocytes, UA LARGE (*)    Bacteria, UA RARE (*)    All other components within normal limits  URINE CULTURE    EKG  EKG Interpretation None       Radiology No results found.  Procedures Procedures  (including critical care time)  Medications Ordered in ED Medications - No data to display  Initial Impression / Assessment and Plan / ED Course  I have reviewed the triage vital signs and the nursing notes.  Pertinent labs & imaging results that were available during my care of the patient were reviewed by me and considered in my medical decision making (see chart for details).  Clinical Course     Andre Holder is a 82 y.o. male who presents to the Emergency Department complaining of a foley catheter problem today. Patient reports partially pulling out his Foley catheter before nursing at his facility deflated it and safely removed it. Patient reported some mild bleeding from the meatus however he was able to urinate normally afterwards. He does report some mild burning.  On exam, patient had no penile tenderness.  No testicle tenderness. No abdominal tenderness. Slight blood at the meatus. Patient was able to urinate without difficulty. Lungs are clear. Patient used a voice machine due to prior cancer removing larynx. Lower extremities had mild edema that is a reportedly improving.  Given mild dysuria, urinalysis sent and returned showing evidence of UTI. Also evidence of bleeding however this is likely due to attempted self Foley removal.  As patient was able to urinate and he reports he is scheduled to have is fully removed in several days, felt patient did not need Foley replacement at this time. Patient given prescription for antibiotics and patient will follow up with PCP in several days.   Patient given strict return precautions for retention, worsening infection, or any other problems. Patient and wife agree to plan of care including discharge. Patient had no other questions or concerns and was discharged in good condition.    Final Clinical Impressions(s) / ED Diagnoses   Final diagnoses:  Acute cystitis with hematuria  Problem with Foley catheter, initial encounter [facility])      New Prescriptions Discharge Medication List as of 08/23/2016 10:36 AM    START taking these medications   Details  sulfamethoxazole-trimethoprim (BACTRIM DS,SEPTRA DS) 800-160 MG tablet Take 1 tablet by mouth 2 (two) times daily., Starting Sat 08/23/2016, Until Tue 08/26/2016, Print       I personally performed the services described in this documentation, which was scribed in my presence. The recorded information has been reviewed and is accurate.  Clinical Impression: 1. Acute cystitis with hematuria   2. Problem with Foley catheter, initial encounter [facility])     Disposition: Discharge  Condition: Good  I have discussed the results, Dx and Tx plan with the pt(& family if present). He/she/they expressed understanding and agree(s) with the plan. Discharge instructions discussed at great length. Strict return precautions discussed and pt &/or family have verbalized understanding of the instructions. No further questions at time of discharge.    Discharge Medication List as of 08/23/2016 10:36 AM    START taking these medications   Details  sulfamethoxazole-trimethoprim (BACTRIM DS,SEPTRA DS) 800-160 MG tablet Take 1 tablet by mouth 2 (two) times daily., Starting Sat 08/23/2016, Until Tue 08/26/2016, Print        Follow Up: Asencion Noble, MD [address] [place] [address] [phone]     [facility] [address] [id number] [place] [phone]  If symptoms worsen     Courtney Paris, MD 08/23/16 2130

## 2016-08-23 NOTE — Discharge Instructions (Signed)
Please take your antibiotics for urinary tract infection. Please follow-up with your primary physician in several days as previously scheduled. If any symptoms return or worsen or you are unable to urinate, please return to the nearest emergency department and he may need a Foley catheter replaced.

## 2016-08-23 NOTE — ED Triage Notes (Signed)
Patient was recently admitted to hospital for CHF and had foley cath placed. Patient to have it taken out on Wednesday by PCP but leg bag became detached and patient was trying to pull foley cath out but unable. Saline removed from balloon and foley removed after patient triage. Small amount of hematuria noted. Patient reports some "stinging and soreness where he tried to remove foley himself."

## 2016-08-25 ENCOUNTER — Emergency Department (HOSPITAL_COMMUNITY)
Admission: EM | Admit: 2016-08-25 | Discharge: 2016-08-25 | Disposition: A | Discharge status: Home / Self Care | Payer: Medicare HMO | Attending: Emergency Medicine | Admitting: Emergency Medicine

## 2016-08-25 ENCOUNTER — Encounter (HOSPITAL_COMMUNITY): Payer: Self-pay | Admitting: Emergency Medicine

## 2016-08-25 ENCOUNTER — Emergency Department (HOSPITAL_COMMUNITY): Payer: Medicare HMO

## 2016-08-25 DIAGNOSIS — R339 Retention of urine, unspecified: Secondary | ICD-10-CM | POA: Insufficient documentation

## 2016-08-25 DIAGNOSIS — I2510 Atherosclerotic heart disease of native coronary artery without angina pectoris: Secondary | ICD-10-CM | POA: Diagnosis not present

## 2016-08-25 DIAGNOSIS — Z87891 Personal history of nicotine dependence: Secondary | ICD-10-CM | POA: Diagnosis not present

## 2016-08-25 DIAGNOSIS — I5023 Acute on chronic systolic (congestive) heart failure: Secondary | ICD-10-CM | POA: Insufficient documentation

## 2016-08-25 DIAGNOSIS — Z79899 Other long term (current) drug therapy: Secondary | ICD-10-CM | POA: Insufficient documentation

## 2016-08-25 DIAGNOSIS — I110 Hypertensive heart disease with heart failure: Secondary | ICD-10-CM | POA: Diagnosis not present

## 2016-08-25 DIAGNOSIS — R0602 Shortness of breath: Secondary | ICD-10-CM

## 2016-08-25 DIAGNOSIS — E039 Hypothyroidism, unspecified: Secondary | ICD-10-CM | POA: Diagnosis not present

## 2016-08-25 DIAGNOSIS — Z7982 Long term (current) use of aspirin: Secondary | ICD-10-CM | POA: Insufficient documentation

## 2016-08-25 LAB — BASIC METABOLIC PANEL
Anion gap: 8 (ref 5–15)
BUN: 24 mg/dL — ABNORMAL HIGH (ref 6–20)
CHLORIDE: 88 mmol/L — AB (ref 101–111)
CO2: 29 mmol/L (ref 22–32)
CREATININE: 1.1 mg/dL (ref 0.61–1.24)
Calcium: 8.8 mg/dL — ABNORMAL LOW (ref 8.9–10.3)
GFR, EST NON AFRICAN AMERICAN: 57 mL/min — AB (ref >60)
Glucose, Bld: 106 mg/dL — ABNORMAL HIGH (ref 65–99)
POTASSIUM: 4.3 mmol/L (ref 3.5–5.1)
SODIUM: 125 mmol/L — AB (ref 135–145)

## 2016-08-25 LAB — CBC
HCT: 30.7 % — ABNORMAL LOW (ref 39.0–52.0)
HEMOGLOBIN: 10.9 g/dL — AB (ref 13.0–17.0)
MCH: 35.2 pg — ABNORMAL HIGH (ref 26.0–34.0)
MCHC: 35.5 g/dL (ref 30.0–36.0)
MCV: 99 fL (ref 78.0–100.0)
PLATELETS: 229 10*3/uL (ref 150–400)
RBC: 3.1 MIL/uL — AB (ref 4.22–5.81)
RDW: 12.6 % (ref 11.5–15.5)
WBC: 6.5 10*3/uL (ref 4.0–10.5)

## 2016-08-25 NOTE — Discharge Instructions (Signed)
Please make sure you follow-up with your primary care doctor and the urologist in the next few days.

## 2016-08-25 NOTE — ED Notes (Signed)
Pt stated he was ready to go. edp [email or web address]

## 2016-08-25 NOTE — ED Provider Notes (Signed)
[facility] DEPT Provider Note   CSN: [id number] Arrival date & time: 08/25/16  1047     History   Chief Complaint Chief Complaint  Patient presents with  . Urinary Retention    HPI Andre Holder is a 82 y.o. male with a PMH of systolic HF (EF 0000000), pacemaker, laryngeal cancer s/p laryngectomy now with trach, HLD, hypothyroidism presenting to the ED with urinary retention. He was hospitalized from 12/12-12/14 for CHF exacerbation. During that hospitalization, he had a foley placed for an unknown reason. He was seen again in the ED on 12/16 because he was trying to remove the foley. He had a lot of pain, a small amount of bleeding, and some new dysuria. The foley was removed and he was able to void spontaneously. UA was performed and showed large LE and some WBCs. He was discharged on Bactrim. Urine culture grew >100,000 proteus mirablis. Susceptibilities are still pending.  Per daughter, Pt last urinated yesterday in the late afternoon. He has been complaining of increasing suprapubic pressure and pain. Daughter has also noted some noisy breathing and is concerned that he has fluid in his lungs. Pt endorses occasional shortness of breath. No chest pain.  HPI  Past Medical History:  Diagnosis Date  . Adrenal hyperplasia ([place])    Stable on serial imaging  . Anemia    minimal in 2011 with hemoglobin of 12.2 and high normal MCV  . Arteriosclerotic cardiovascular disease (ASCVD)    Nonobstructive; 09/2008 50% proximal and 40% mid LAD; 25% circumflex; 30% RCA; mild global LV dysfunction with EF of 45%. No aortic stenosis.  . Borderline hypertension    Normal CMet in 2011  . Cancer of larynx ([place])    laryngectomy in 1988; postoperative radiation therapy  . Carotid stenosis   . 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 Degenerative joint disease    s/p bilateral TKR  . GERD (gastroesophageal reflux disease)   . Hyperlipidemia    Lipid  profile in 04/2010:115, 98, 43, 52.  Marland Kitchen Hypothyroidism   . Mild aortic stenosis    not documented at catheterization; verified by echo in 2011  . Mitral regurgitation   . Mobitz (type) II atrioventricular block    With bradycardia; Medtronic pacemaker implanted in 09/2008  . Peripheral vascular disease ([place])    With a 70% innominate artery stenosis and nonobstructive carotid stenosis  . Skin cancer   . Small bowel obstruction   . Tobacco abuse, in remission    Remote  . Weight loss    50 pounds between 1991 and 2011    Patient Active Problem List   Diagnosis Date Noted  . Shortness of breath 01/18/2016  . Edema 01/18/2016  . Protein calorie malnutrition ([place]) 01/18/2016  . Acute on chronic systolic congestive heart failure ([place])   . Edema extremities   . Partial small bowel obstruction 11/05/2015  . Anemia 11/05/2015  . Malnutrition of moderate degree ([place]) 01/11/2015  . Small bowel obstruction 12/23/2014  . Chronic systolic congestive heart failure, NYHA class 2 ([place]) 12/23/2014  . SBO (small bowel obstruction) ([place]) 12/23/2014  . CHF exacerbation ([place]) 08/31/2014  . Acute on chronic systolic CHF (congestive heart failure) ([place]) 08/31/2014  . Chest pain 08/31/2014  . Dyspnea 08/31/2014  . CHF (congestive heart failure) ([place]) 08/25/2013  . Syncope 10/09/2011  . Hyponatremia 10/09/2011  . GERD 06/17/2010  . WEIGHT LOSS, ABNORMAL 06/17/2010  . ANEMIA 04/08/2010  .  Mobitz type II atrioventricular block 04/08/2010  . Hyperlipidemia 10/24/2009  . PACEMAKER, PERMANENT 09/20/2009  . Malignant neoplasm of larynx ([place]) 03/07/2009  . Aortic valve disorder 03/07/2009  . CEREBROVASCULAR DISEASE 03/07/2009  . PERIPHERAL VASCULAR DISEASE 03/07/2009  . Tobacco abuse, in remission 03/07/2009  . Hypothyroidism 12/27/2008  . OSTEOARTHRITIS 12/27/2008    Past Surgical History:  Procedure Laterality Date  . APPENDECTOMY  1973  . CATARACT EXTRACTION, BILATERAL    . DECOMPRESSION  FACIAL NERVE     Right median  . INSERT / REPLACE / REMOVE PACEMAKER    . KNEE ARTHROSCOPY     Left  . LARYNGECTOMY  1988   S/P laryngectomy and radiation therapy  . PACEMAKER INSERTION    . TOTAL KNEE ARTHROPLASTY     Bilateral, 19 years ago       Home Medications    Prior to Admission medications   Medication Sig Start Date End Date Taking? Authorizing Provider  Ascorbic Acid (VITAMIN C PO) Take 1 tablet by mouth daily.   Yes Historical Provider, MD  aspirin EC 81 MG tablet Take 81 mg by mouth daily.   Yes Historical Provider, MD  atorvastatin (LIPITOR) 10 MG tablet Take 10 mg by mouth daily.   Yes Historical Provider, MD  Cholecalciferol (VITAMIN D PO) Take 1 tablet by mouth daily.   Yes Historical Provider, MD  Coenzyme Q10 (CO Q 10 PO) Take 1 tablet by mouth daily.    Yes Historical Provider, MD  Cyanocobalamin (VITAMIN B-12 PO) Take 1 tablet by mouth daily.   Yes Historical Provider, MD  diclofenac sodium (VOLTAREN) 1 % GEL Apply 2 g topically daily as needed (pain).   Yes Historical Provider, MD  finasteride (PROSCAR) 5 MG tablet Take 5 mg by mouth at bedtime.   Yes Historical Provider, MD  guaifenesin (MUCUS RELIEF) 400 MG TABS tablet Take 400 mg by mouth every 4 (four) hours as needed (for mucus).   Yes Historical Provider, MD  HYDROcodone-acetaminophen (NORCO/VICODIN) 5-325 MG tablet Take 2 tablets by mouth every 4 (four) hours as needed. 12/24/15  Yes [name], MD  levothyroxine (SYNTHROID, LEVOTHROID) 200 MCG tablet Take 200 mcg by mouth daily before breakfast.   Yes Historical Provider, MD  magnesium oxide (MAG-OX) 400 MG tablet Take 400 mg by mouth 2 (two) times daily.   Yes Historical Provider, MD  Melatonin 10 MG CAPS Take 1 capsule by mouth at bedtime.    Yes Historical Provider, MD  pantoprazole (PROTONIX) 40 MG tablet Take 40 mg by mouth 2 (two) times daily.   Yes Historical Provider, MD  potassium chloride (K-DUR) 10 MEQ tablet Take 1 tablet (10 mEq total) by  mouth 2 (two) times daily. Patient taking differently: Take 10 mEq by mouth daily.  01/23/16  Yes Asencion Noble, MD  Resveratrol 250 MG CAPS Take 1 capsule by mouth 2 (two) times daily.   Yes Historical Provider, MD  ropinirole (REQUIP) 5 MG tablet Take 5 mg by mouth at bedtime.   Yes Historical Provider, MD  sulfamethoxazole-trimethoprim (BACTRIM DS,SEPTRA DS) 800-160 MG tablet Take 1 tablet by mouth 2 (two) times daily. 08/23/16 08/26/16 Yes Gwenyth Allegra Tegeler, MD  Tamsulosin HCl (FLOMAX) 0.4 MG CAPS Take 0.4 mg by mouth 2 (two) times daily.    Yes Historical Provider, MD  torsemide (DEMADEX) 20 MG tablet Take 1 tablet (20 mg total) by mouth daily. 08/21/16  Yes Asencion Noble, MD    Family History Family History  Problem Relation Age  of Onset  . Stroke Mother   . Leukemia Father   . Stroke Other   . Diabetes Other   . Colon cancer Neg Hx   . Liver disease Neg Hx   . GI problems Neg Hx     Social History Social History  Substance Use Topics  . Smoking status: Former Smoker    Types: Cigarettes  . Smokeless tobacco: Former Systems developer    Quit date: 09/08/1982     Comment: Quit 30 years  . Alcohol use No     Allergies   Patient has no known allergies.   Review of Systems Review of Systems 10 Systems reviewed and are negative for acute change except as noted in the HPI.  Physical Exam Updated Vital Signs BP (!) 113/48   Pulse 69   Temp 98.6 F (37 C) (Oral)   Resp 19   SpO2 99%   Physical Exam  Constitutional: He is oriented to person, place, and time. He appears well-developed and well-nourished.  HENT:  Head: Normocephalic and atraumatic.  Eyes: Conjunctivae are normal.  Neck: Neck supple.  Trach without surrounding erythema  Cardiovascular: Normal rate and regular rhythm.   No murmur heard. Pulmonary/Chest: No respiratory distress.  Normal work of breathing, bibasilar crackles present, good air movement.  Abdominal: Soft. Bowel sounds are normal.  Suprapubic area  exquisitely tender to palpation.  Genitourinary:  Genitourinary Comments: 10 cm x 6 cm indirect inguinal hernia present, hernia is fully reducible  Musculoskeletal: Normal range of motion. He exhibits no edema.  Neurological: He is alert and oriented to person, place, and time.  Skin: Skin is warm and dry.  Psychiatric: He has a normal mood and affect. His behavior is normal. Thought content normal.  Nursing note and vitals reviewed.    ED Treatments / Results  Labs (all labs ordered are listed, but only abnormal results are displayed) Labs Reviewed  CBC - Abnormal; Notable for the following:       Result Value   RBC 3.10 (*)    Hemoglobin 10.9 (*)    HCT 30.7 (*)    MCH 35.2 (*)    All other components within normal limits  BASIC METABOLIC PANEL - Abnormal; Notable for the following:    Sodium 125 (*)    Chloride 88 (*)    Glucose, Bld 106 (*)    BUN 24 (*)    Calcium 8.8 (*)    GFR calc non Af Amer 57 (*)    All other components within normal limits  URINALYSIS, ROUTINE W REFLEX MICROSCOPIC    EKG  EKG Interpretation None       Radiology No results found.  Procedures Procedures (including critical care time)  Medications Ordered in ED Medications - No data to display   Initial Impression / Assessment and Plan / ED Course  I have reviewed the triage vital signs and the nursing notes.  Pertinent lab results that were available during my care of the patient were reviewed by me and considered in my medical decision making (see chart for details).  Clinical Course    Andre Holder is a 82 year old male with a PMH of systolic HF, hx laryngeal cancer s/p laryngectomy with trach, HLD, hypothyroidism presenting to the ED with urinary retention. He just had his foley removed 2 days ago and has been unable to urinate since yesterday late afternoon. He is currently on Bactrim for Proteus UTI (susceptibilities still pending). In the ED, bladder scan showed >219ml  urine. Foley  was placed with improvement of suprapubic discomfort. Will check basic labs, including BMP and CBC. Pt also with some crackles in his bases and endorsing occasional SOB, so will get CXR as well.  1:20PM: Urinary catheter placed about 1.5 hours ago. He has had 1400cc of urinary output and is feeling much better. Pt refused CXR because his shortness of breath has gone completely away and he feels normal. Labs with sodium of 125, although patient is chronically low. Cr only mildly elevated at 1.10 from a baseline of 0.78-0.90. Pt is safe for discharge home with PCP and urology follow-up in the next few days. Will have PCP follow-up with urine culture susceptibilities and adjust the antibiotic regimen if appropriate.  Final Clinical Impressions(s) / ED Diagnoses   Final diagnoses:  Urinary retention    New Prescriptions Current Discharge Medication List       Sela Hua, MD 08/25/16 [place], MD 08/25/16 1326    Elnora Morrison, MD 08/26/16 [phone]

## 2016-08-25 NOTE — ED Triage Notes (Signed)
Catheter removed 2 days ago, last time voided late yesterday afternoon

## 2016-08-26 LAB — URINE CULTURE

## 2016-08-27 ENCOUNTER — Telehealth (HOSPITAL_BASED_OUTPATIENT_CLINIC_OR_DEPARTMENT_OTHER): Payer: Self-pay

## 2016-08-27 NOTE — Telephone Encounter (Signed)
Post ED Visit - Positive Culture Follow-up  Culture report reviewed by antimicrobial stewardship pharmacist:  []  Elenor Quinones, Pharm.D. []  Heide Guile, Pharm.D., BCPS [x]  Parks Neptune, Pharm.D. []  Alycia Rossetti, Pharm.D., BCPS []  [place], [place].D., BCPS, AAHIVP []  Legrand Como, Pharm.D., BCPS, AAHIVP []  Cassie Stewart, Pharm.D. []  Stephens November, Pharm.D.  Positive urine culture -> >/= 100,000 colonies -> Proteus Mirabilis Treated with Sulfa-Trimeth, organism sensitive to the same and no further patient follow-up is required at this time.  Dortha Kern 08/27/2016, 9:25 AM

## 2016-08-30 ENCOUNTER — Emergency Department (HOSPITAL_COMMUNITY)
Admission: EM | Admit: 2016-08-30 | Discharge: 2016-08-30 | Disposition: A | Discharge status: Home / Self Care | Payer: Medicare HMO | Attending: Emergency Medicine | Admitting: Emergency Medicine

## 2016-08-30 ENCOUNTER — Encounter (HOSPITAL_COMMUNITY): Payer: Self-pay | Admitting: *Deleted

## 2016-08-30 DIAGNOSIS — I5023 Acute on chronic systolic (congestive) heart failure: Secondary | ICD-10-CM | POA: Diagnosis not present

## 2016-08-30 DIAGNOSIS — Z87891 Personal history of nicotine dependence: Secondary | ICD-10-CM | POA: Diagnosis not present

## 2016-08-30 DIAGNOSIS — Y69 Unspecified misadventure during surgical and medical care: Secondary | ICD-10-CM | POA: Diagnosis not present

## 2016-08-30 DIAGNOSIS — Z85828 Personal history of other malignant neoplasm of skin: Secondary | ICD-10-CM | POA: Diagnosis not present

## 2016-08-30 DIAGNOSIS — Z7689 Persons encountering health services in other specified circumstances: Secondary | ICD-10-CM

## 2016-08-30 DIAGNOSIS — T83038A Leakage of other indwelling urethral catheter, initial encounter: Secondary | ICD-10-CM | POA: Insufficient documentation

## 2016-08-30 DIAGNOSIS — Z7982 Long term (current) use of aspirin: Secondary | ICD-10-CM | POA: Diagnosis not present

## 2016-08-30 DIAGNOSIS — Z79899 Other long term (current) drug therapy: Secondary | ICD-10-CM | POA: Insufficient documentation

## 2016-08-30 DIAGNOSIS — E039 Hypothyroidism, unspecified: Secondary | ICD-10-CM | POA: Diagnosis not present

## 2016-08-30 DIAGNOSIS — Z950 Presence of cardiac pacemaker: Secondary | ICD-10-CM | POA: Insufficient documentation

## 2016-08-30 DIAGNOSIS — Z8521 Personal history of malignant neoplasm of larynx: Secondary | ICD-10-CM | POA: Insufficient documentation

## 2016-08-30 NOTE — ED Provider Notes (Signed)
Emergency Department Provider Note  By signing my name below, I, Bea Graff, attest that this documentation has been prepared under the direction and in the presence of Margette Fast, MD. Electronically Signed: Bea Graff, ED Scribe. 08/30/16. 10:26 PM.   I have reviewed the triage vital signs and the nursing notes.   HISTORY  Chief Complaint foley bag leaking   HPI Andre Holder is a 82 y.o. male complaining of a leaking foley bag. Pt states his doctor was supposed to remove the foley completely yesterday but he did not want to travel to his doctor in [place]. His wife states he has another appt in [place] but it is not until January 11. He reports a burning sensation at the site of insertion and believes it is "stopped up" but that urine continues to flow. He has not done anything as treatment. He denies modifying factors. He denies fever, chills, nausea, vomiting, abdominal pain, CP, SOB or any other complaints.    Past Medical History:  Diagnosis Date  . Adrenal hyperplasia ([place])    Stable on serial imaging  . Anemia    minimal in 2011 with hemoglobin of 12.2 and high normal MCV  . Arteriosclerotic cardiovascular disease (ASCVD)    Nonobstructive; 09/2008 50% proximal and 40% mid LAD; 25% circumflex; 30% RCA; mild global LV dysfunction with EF of 45%. No aortic stenosis.  . Borderline hypertension    Normal CMet in 2011  . Cancer of larynx ([place])    laryngectomy in 1988; postoperative radiation therapy  . Carotid stenosis   . 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 Degenerative joint disease    s/p bilateral TKR  . GERD (gastroesophageal reflux disease)   . Hyperlipidemia    Lipid profile in 04/2010:115, 98, 43, 52.  Marland Kitchen Hypothyroidism   . Mild aortic stenosis    not documented at catheterization; verified by echo in 2011  . Mitral regurgitation   . Mobitz (type) II atrioventricular block    With  bradycardia; Medtronic pacemaker implanted in 09/2008  . Peripheral vascular disease ([place])    With a 70% innominate artery stenosis and nonobstructive carotid stenosis  . Skin cancer   . Small bowel obstruction   . Tobacco abuse, in remission    Remote  . Weight loss    50 pounds between 1991 and 2011    Patient Active Problem List   Diagnosis Date Noted  . Shortness of breath 01/18/2016  . Edema 01/18/2016  . Protein calorie malnutrition ([place]) 01/18/2016  . Acute on chronic systolic congestive heart failure ([place])   . Edema extremities   . Partial small bowel obstruction 11/05/2015  . Anemia 11/05/2015  . Malnutrition of moderate degree ([place]) 01/11/2015  . Small bowel obstruction 12/23/2014  . Chronic systolic congestive heart failure, NYHA class 2 ([place]) 12/23/2014  . SBO (small bowel obstruction) ([place]) 12/23/2014  . CHF exacerbation ([place]) 08/31/2014  . Acute on chronic systolic CHF (congestive heart failure) ([place]) 08/31/2014  . Chest pain 08/31/2014  . Dyspnea 08/31/2014  . CHF (congestive heart failure) ([place]) 08/25/2013  . Syncope 10/09/2011  . Hyponatremia 10/09/2011  . GERD 06/17/2010  . WEIGHT LOSS, ABNORMAL 06/17/2010  . ANEMIA 04/08/2010  . Mobitz type II atrioventricular block 04/08/2010  . Hyperlipidemia 10/24/2009  . PACEMAKER, PERMANENT 09/20/2009  . Malignant neoplasm of larynx ([place]) 03/07/2009  . Aortic valve disorder 03/07/2009  . CEREBROVASCULAR DISEASE 03/07/2009  . PERIPHERAL  VASCULAR DISEASE 03/07/2009  . Tobacco abuse, in remission 03/07/2009  . Hypothyroidism 12/27/2008  . OSTEOARTHRITIS 12/27/2008    Past Surgical History:  Procedure Laterality Date  . APPENDECTOMY  1973  . CATARACT EXTRACTION, BILATERAL    . DECOMPRESSION FACIAL NERVE     Right median  . INSERT / REPLACE / REMOVE PACEMAKER    . KNEE ARTHROSCOPY     Left  . LARYNGECTOMY  1988   S/P laryngectomy and radiation therapy  . PACEMAKER INSERTION    . TOTAL KNEE ARTHROPLASTY      Bilateral, 19 years ago    Current Outpatient Rx  . Order #: [id number] Class: Historical Med  . Order #: [id number] Class: Historical Med  . Order #: [id number] Class: Historical Med  . Order #: [id number] Class: Historical Med  . Order #: [id number] Class: Historical Med  . Order #: [id number] Class: Historical Med  . Order #: [id number] Class: Historical Med  . Order #: [id number] Class: Historical Med  . Order #: [id number] Class: Historical Med  . Order #: [id number] Class: Print  . Order #: [id number] Class: Historical Med  . Order #: [id number] Class: Historical Med  . Order #: [id number] Class: Historical Med  . Order #: [id number] Class: Historical Med  . Order #: [id number] Class: Normal  . Order #: [id number] Class: Historical Med  . Order #: [id number] Class: Historical Med  . Order #: [id number] Class: Historical Med  . Order #: [id number] Class: Historical Med  . Order #: [id number] Class: Normal    Allergies Patient has no known allergies.  Family History  Problem Relation Age of Onset  . Stroke Mother   . Leukemia Father   . Stroke Other   . Diabetes Other   . Colon cancer Neg Hx   . Liver disease Neg Hx   . GI problems Neg Hx     Social History Social History  Substance Use Topics  . Smoking status: Former Smoker    Types: Cigarettes  . Smokeless tobacco: Former Systems developer    Quit date: 09/08/1982     Comment: Quit 30 years  . Alcohol use No    Review of Systems  Constitutional: No fever/chills Eyes: No visual changes. ENT: No sore throat. Cardiovascular: Denies chest pain. Respiratory: Denies shortness of breath. Gastrointestinal: No abdominal pain.  No nausea, no vomiting.  No diarrhea.  No constipation. Genitourinary: Negative for dysuria. Leaking foley bag. Burning at foley insertion site. Musculoskeletal: Negative for back pain. Skin: Negative for rash. Neurological: Negative for headaches, focal weakness or numbness.  10-point ROS otherwise  negative.  ____________________________________________   PHYSICAL EXAM:  VITAL SIGNS: ED Triage Vitals [08/30/16 2133]  Enc Vitals Group     BP 128/69     Pulse Rate 102     Resp 24     Temp 98.5 F (36.9 C)     Temp Source Oral     SpO2 97 %   Constitutional: Alert and oriented. Well appearing and in no acute distress. Eyes: Conjunctivae are normal.  Head: Atraumatic. Nose: No congestion/rhinnorhea. Mouth/Throat: Mucous membranes are moist.  Oropharynx non-erythematous. Neck: No stridor. Cardiovascular: Normal rate, regular rhythm. Good peripheral circulation. Grossly normal heart sounds.   Respiratory: Normal respiratory effort.  No retractions. Lungs CTAB. Gastrointestinal: Soft and nontender. No distention.  Musculoskeletal: No lower extremity tenderness nor edema. No gross deformities of extremities. Neurologic:  Normal speech and language. No gross focal neurologic deficits are appreciated.  Skin:  Skin is warm, dry and intact. No rash noted.  ____________________________________________   LABS (all labs ordered are listed, but  only abnormal results are displayed)  None  ____________________________________________   PROCEDURES  Procedure(s) performed:   Procedures  None ____________________________________________   INITIAL IMPRESSION / ASSESSMENT AND PLAN / ED COURSE  Pertinent labs & imaging results that were available during my care of the patient were reviewed by me and considered in my medical decision making (see chart for details).  Patient resents the emergency department for evaluation of leaking Foley bag. Patient states this was to go to urologist 2 days ago to have the bag removed in the office but he was unable to go because he did not travel to [place]. He is requesting that the Foley be removed at this time is having some burning at the insertion site. Patient has no objective or subjective findings to suggest fever or systemic  infection. He is having some burning at the site of insertion and has been on antibiotics for urinary tract infection vs colonization. I reminded the patient and his wife that when the Foley was removed previously he had an episode of urinary retention 2 days later in the Foley had to be replaced. I stated that I was hesitant to remove the Foley catheter here if he has not been seen by the urologist. Patient states he does not want anymore and understands that there is a risk and may need to be replaced if he is unable to urinate on his own. Plan to hurt her foot catheter this time and reassess.   10:52 PM Patient with no urge to urinate as of yet. He is on abx for UTI currently. He would like to be discharged and has no burning sensation since catheter removal. Discussed return precautions for urinary retention in detail. Patient and wife are pleased at discharge.   At this time, I do not feel there is any life-threatening condition present. I have reviewed and discussed all results (EKG, imaging, lab, urine as appropriate), exam findings with patient. I have reviewed nursing notes and appropriate previous records.  I feel the patient is safe to be discharged home without further emergent workup. Discussed usual and customary return precautions. Patient and family (if present) verbalize understanding and are comfortable with this plan.  Patient will follow-up with their primary care provider. If they do not have a primary care provider, information for follow-up has been provided to them. All questions have been answered.  ____________________________________________  FINAL CLINICAL IMPRESSION(S) / ED DIAGNOSES  Final diagnoses:  Encounter for assessment of Foley catheter     MEDICATIONS GIVEN DURING THIS VISIT:  None  NEW OUTPATIENT MEDICATIONS STARTED DURING THIS VISIT:  None  I personally performed the services described in this documentation, which was scribed in my presence. The  recorded information has been reviewed and is accurate.    Note:  This document was prepared using Dragon voice recognition software and may include unintentional dictation errors.  Nanda Quinton, MD Emergency Medicine    Margette Fast, MD 08/30/16 [phone]

## 2016-08-30 NOTE — ED Notes (Signed)
Pt & family member advised if needs to use restroom use the urinal provided and let this nurse know.

## 2016-08-30 NOTE — ED Triage Notes (Signed)
Pt states foley bag started leaking tonight. Pt wanting foley removed. Says Dr was going to remove yesterday.

## 2016-08-30 NOTE — Discharge Instructions (Signed)
You were seen in the ED today with pain at the foley catheter site. We removed the catheter but you should still follow up with the Urologist and your PCP. Return to the ED immediately if you are unable to urinate at least once every 8 hours or if you begin to have lower abdominal pain.

## 2016-08-30 NOTE — ED Notes (Signed)
Pt alert & oriented x4. Patient given discharge instructions, paperwork & prescription(s). Patient verbalized understanding. Pt left department in wheelchair escorted by staff. Pt left w/ no further questions. 

## 2016-09-03 ENCOUNTER — Encounter (HOSPITAL_COMMUNITY): Payer: Self-pay | Admitting: Emergency Medicine

## 2016-09-03 ENCOUNTER — Emergency Department (HOSPITAL_COMMUNITY)
Admission: EM | Admit: 2016-09-03 | Discharge: 2016-09-03 | Disposition: A | Discharge status: Home / Self Care | Payer: Medicare HMO | Attending: Emergency Medicine | Admitting: Emergency Medicine

## 2016-09-03 DIAGNOSIS — Z79899 Other long term (current) drug therapy: Secondary | ICD-10-CM | POA: Diagnosis not present

## 2016-09-03 DIAGNOSIS — E039 Hypothyroidism, unspecified: Secondary | ICD-10-CM | POA: Diagnosis not present

## 2016-09-03 DIAGNOSIS — Z8521 Personal history of malignant neoplasm of larynx: Secondary | ICD-10-CM | POA: Insufficient documentation

## 2016-09-03 DIAGNOSIS — Z7982 Long term (current) use of aspirin: Secondary | ICD-10-CM | POA: Diagnosis not present

## 2016-09-03 DIAGNOSIS — R339 Retention of urine, unspecified: Secondary | ICD-10-CM | POA: Diagnosis present

## 2016-09-03 DIAGNOSIS — Z87891 Personal history of nicotine dependence: Secondary | ICD-10-CM | POA: Diagnosis not present

## 2016-09-03 DIAGNOSIS — I5022 Chronic systolic (congestive) heart failure: Secondary | ICD-10-CM | POA: Insufficient documentation

## 2016-09-03 DIAGNOSIS — Z950 Presence of cardiac pacemaker: Secondary | ICD-10-CM | POA: Diagnosis not present

## 2016-09-03 LAB — URINALYSIS, ROUTINE W REFLEX MICROSCOPIC
Bilirubin Urine: NEGATIVE
GLUCOSE, UA: NEGATIVE mg/dL
Hgb urine dipstick: NEGATIVE
KETONES UR: NEGATIVE mg/dL
LEUKOCYTES UA: NEGATIVE
Nitrite: NEGATIVE
PH: 6 (ref 5.0–8.0)
Protein, ur: NEGATIVE mg/dL
SPECIFIC GRAVITY, URINE: 1.009 (ref 1.005–1.030)

## 2016-09-03 NOTE — Discharge Instructions (Signed)
Take your usual prescriptions as previously directed.  Call your regular Urologist tomorrow to schedule a follow up appointment within the next week.  Return to the Emergency Department immediately sooner if worsening.

## 2016-09-03 NOTE — ED Triage Notes (Signed)
Patient states unable to void. Was seen December 18th for leakage in urinary catheter. Catheter was removed. Patient states he is unable to see Urologist until January 26th. Pt states burning sensation when able to void; states last time voided was this morning.

## 2016-09-03 NOTE — ED Provider Notes (Signed)
[facility] DEPT Provider Note   CSN: [id number] Arrival date & time: 09/03/16  1627     History   Chief Complaint Chief Complaint  Patient presents with  . Urinary Retention    HPI Andre Holder is a 82 y.o. male.  HPI  Pt was seen at 1700. Per pt and his family, c/o gradual onset and persistence of constant urinary retention that started this morning. States he has hx of urinary retention, had foley placed and removed over the past several weeks. Pt states his Uro MD f/u appointment is 10/03/16. Denies testicular pain/swelling, no dysuria/hematuria, no back pain, no abd pain, no N/V/D, no fevers, no rash.    Past Medical History:  Diagnosis Date  . Adrenal hyperplasia ([place])    Stable on serial imaging  . Anemia    minimal in 2011 with hemoglobin of 12.2 and high normal MCV  . Arteriosclerotic cardiovascular disease (ASCVD)    Nonobstructive; 09/2008 50% proximal and 40% mid LAD; 25% circumflex; 30% RCA; mild global LV dysfunction with EF of 45%. No aortic stenosis.  . Borderline hypertension    Normal CMet in 2011  . Cancer of larynx ([place])    laryngectomy in 1988; postoperative radiation therapy  . Carotid stenosis   . 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 Degenerative joint disease    s/p bilateral TKR  . GERD (gastroesophageal reflux disease)   . Hyperlipidemia    Lipid profile in 04/2010:115, 98, 43, 52.  Marland Kitchen Hypothyroidism   . Mild aortic stenosis    not documented at catheterization; verified by echo in 2011  . Mitral regurgitation   . Mobitz (type) II atrioventricular block    With bradycardia; Medtronic pacemaker implanted in 09/2008  . Peripheral vascular disease ([place])    With a 70% innominate artery stenosis and nonobstructive carotid stenosis  . Skin cancer   . Small bowel obstruction   . Tobacco abuse, in remission    Remote  . Weight loss    50 pounds between 1991 and 2011    Patient Active Problem List    Diagnosis Date Noted  . Shortness of breath 01/18/2016  . Edema 01/18/2016  . Protein calorie malnutrition ([place]) 01/18/2016  . Acute on chronic systolic congestive heart failure ([place])   . Edema extremities   . Partial small bowel obstruction 11/05/2015  . Anemia 11/05/2015  . Malnutrition of moderate degree ([place]) 01/11/2015  . Small bowel obstruction 12/23/2014  . Chronic systolic congestive heart failure, NYHA class 2 ([place]) 12/23/2014  . SBO (small bowel obstruction) ([place]) 12/23/2014  . CHF exacerbation ([place]) 08/31/2014  . Acute on chronic systolic CHF (congestive heart failure) ([place]) 08/31/2014  . Chest pain 08/31/2014  . Dyspnea 08/31/2014  . CHF (congestive heart failure) ([place]) 08/25/2013  . Syncope 10/09/2011  . Hyponatremia 10/09/2011  . GERD 06/17/2010  . WEIGHT LOSS, ABNORMAL 06/17/2010  . ANEMIA 04/08/2010  . Mobitz type II atrioventricular block 04/08/2010  . Hyperlipidemia 10/24/2009  . PACEMAKER, PERMANENT 09/20/2009  . Malignant neoplasm of larynx ([place]) 03/07/2009  . Aortic valve disorder 03/07/2009  . CEREBROVASCULAR DISEASE 03/07/2009  . PERIPHERAL VASCULAR DISEASE 03/07/2009  . Tobacco abuse, in remission 03/07/2009  . Hypothyroidism 12/27/2008  . OSTEOARTHRITIS 12/27/2008    Past Surgical History:  Procedure Laterality Date  . APPENDECTOMY  1973  . CATARACT EXTRACTION, BILATERAL    . DECOMPRESSION FACIAL NERVE     Right median  .  INSERT / REPLACE / REMOVE PACEMAKER    . KNEE ARTHROSCOPY     Left  . LARYNGECTOMY  1988   S/P laryngectomy and radiation therapy  . PACEMAKER INSERTION    . TOTAL KNEE ARTHROPLASTY     Bilateral, 19 years ago       Home Medications    Prior to Admission medications   Medication Sig Start Date End Date Taking? Authorizing Provider  Ascorbic Acid (VITAMIN C PO) Take 1 tablet by mouth daily.    Historical Provider, MD  aspirin EC 81 MG tablet Take 81 mg by mouth daily.    Historical Provider, MD  atorvastatin  (LIPITOR) 10 MG tablet Take 10 mg by mouth daily.    Historical Provider, MD  Cholecalciferol (VITAMIN D PO) Take 1 tablet by mouth daily.    Historical Provider, MD  Coenzyme Q10 (CO Q 10 PO) Take 1 tablet by mouth daily.     Historical Provider, MD  Cyanocobalamin (VITAMIN B-12 PO) Take 1 tablet by mouth daily.    Historical Provider, MD  diclofenac sodium (VOLTAREN) 1 % GEL Apply 2 g topically daily as needed (pain).    Historical Provider, MD  finasteride (PROSCAR) 5 MG tablet Take 5 mg by mouth at bedtime.    Historical Provider, MD  guaifenesin (MUCUS RELIEF) 400 MG TABS tablet Take 400 mg by mouth every 4 (four) hours as needed (for mucus).    Historical Provider, MD  HYDROcodone-acetaminophen (NORCO/VICODIN) 5-325 MG tablet Take 2 tablets by mouth every 4 (four) hours as needed. 12/24/15   [name], MD  levothyroxine (SYNTHROID, LEVOTHROID) 200 MCG tablet Take 200 mcg by mouth daily before breakfast.    Historical Provider, MD  magnesium oxide (MAG-OX) 400 MG tablet Take 400 mg by mouth 2 (two) times daily.    Historical Provider, MD  Melatonin 10 MG CAPS Take 1 capsule by mouth at bedtime.     Historical Provider, MD  pantoprazole (PROTONIX) 40 MG tablet Take 40 mg by mouth 2 (two) times daily.    Historical Provider, MD  potassium chloride (K-DUR) 10 MEQ tablet Take 1 tablet (10 mEq total) by mouth 2 (two) times daily. Patient taking differently: Take 10 mEq by mouth daily.  01/23/16   Asencion Noble, MD  Resveratrol 250 MG CAPS Take 1 capsule by mouth 2 (two) times daily.    Historical Provider, MD  ropinirole (REQUIP) 5 MG tablet Take 5 mg by mouth at bedtime.    Historical Provider, MD  sulfamethoxazole-trimethoprim (BACTRIM DS,SEPTRA DS) 800-160 MG tablet Take 1 tablet by mouth 2 (two) times daily. 7 day course starting on 08/28/2016 08/28/16   Historical Provider, MD  Tamsulosin HCl (FLOMAX) 0.4 MG CAPS Take 0.4 mg by mouth 2 (two) times daily.     Historical Provider, MD  torsemide  (DEMADEX) 20 MG tablet Take 1 tablet (20 mg total) by mouth daily. 08/21/16   Asencion Noble, MD    Family History Family History  Problem Relation Age of Onset  . Stroke Mother   . Leukemia Father   . Stroke Other   . Diabetes Other   . Colon cancer Neg Hx   . Liver disease Neg Hx   . GI problems Neg Hx     Social History Social History  Substance Use Topics  . Smoking status: Former Smoker    Types: Cigarettes  . Smokeless tobacco: Former Systems developer    Quit date: 09/08/1982     Comment: Quit 30 years  .  Alcohol use No     Allergies   Patient has no known allergies.   Review of Systems Review of Systems ROS: Statement: All systems negative except as marked or noted in the HPI; Constitutional: Negative for fever and chills. ; ; Eyes: Negative for eye pain, redness and discharge. ; ; ENMT: Negative for ear pain, hoarseness, nasal congestion, sinus pressure and sore throat. ; ; Cardiovascular: Negative for chest pain, palpitations, diaphoresis, dyspnea and peripheral edema. ; ; Respiratory: Negative for cough, wheezing and stridor. ; ; Gastrointestinal: Negative for nausea, vomiting, diarrhea, abdominal pain, blood in stool, hematemesis, jaundice and rectal bleeding. . ; ; Genitourinary: +urinary retention. Negative for dysuria, flank pain and hematuria. ; ; Genital:  No penile drainage or rash, no testicular pain or swelling, no scrotal rash or swelling. ;; Musculoskeletal: Negative for back pain and neck pain. Negative for swelling and trauma.; ; Skin: Negative for pruritus, rash, abrasions, blisters, bruising and skin lesion.; ; Neuro: Negative for headache, lightheadedness and neck stiffness. Negative for weakness, altered level of consciousness, altered mental status, extremity weakness, paresthesias, involuntary movement, seizure and syncope.       Physical Exam Updated Vital Signs BP 119/59   Pulse 77   Temp 98 F (36.7 C) (Oral)   Resp 16   Ht 6\' 1"  (1.854 m)   Wt 159 lb (72.1  kg)   SpO2 95%   BMI 20.98 kg/m   Physical Exam 1705: Physical examination:  Nursing notes reviewed; Vital signs and O2 SAT reviewed;  Constitutional: Well developed, Well nourished, Well hydrated, In no acute distress; Head:  Normocephalic, atraumatic; Eyes: EOMI, PERRL, No scleral icterus; ENMT: Mouth and pharynx normal, Mucous membranes moist; Neck: Supple, Full range of motion, No lymphadenopathy; Cardiovascular: Regular rate and rhythm, No gallop; Respiratory: Breath sounds clear & equal bilaterally, No wheezes.  Speaking full sentences with ease, Normal respiratory effort/excursion; Chest: Nontender, Movement normal; Abdomen: Soft, Nontender, Nondistended, Normal bowel sounds; Genitourinary: No CVA tenderness; Extremities: Pulses normal, No tenderness, No edema, No calf edema or asymmetry.; Neuro: AA&Ox3, Major CN grossly intact.  Speech clear. No gross focal motor or sensory deficits in extremities.; Skin: Color normal, Warm, Dry.   ED Treatments / Results  Labs (all labs ordered are listed, but only abnormal results are displayed)   EKG  EKG Interpretation None       Radiology   Procedures Procedures (including critical care time)  Medications Ordered in ED Medications - No data to display   Initial Impression / Assessment and Plan / ED Course  I have reviewed the triage vital signs and the nursing notes.  Pertinent labs & imaging results that were available during my care of the patient were reviewed by me and considered in my medical decision making (see chart for details).  MDM Reviewed: nursing note, vitals and previous chart Interpretation: labs   Results for orders placed or performed during the hospital encounter of 09/03/16  Urinalysis, Routine w reflex microscopic- may I&O cath if menses  Result Value Ref Range   Color, Urine STRAW (A) YELLOW   APPearance CLEAR CLEAR   Specific Gravity, Urine 1.009 1.005 - 1.030   pH 6.0 5.0 - 8.0   Glucose, UA  NEGATIVE NEGATIVE mg/dL   Hgb urine dipstick NEGATIVE NEGATIVE   Bilirubin Urine NEGATIVE NEGATIVE   Ketones, ur NEGATIVE NEGATIVE mg/dL   Protein, ur NEGATIVE NEGATIVE mg/dL   Nitrite NEGATIVE NEGATIVE   Leukocytes, UA NEGATIVE NEGATIVE    1800:  Bladder scan with 940ml. Foley catheter placed. Pt states he feels better. No UTI on Udip. Dx and testing d/w pt and family.  Questions answered.  Verb understanding, agreeable to d/c home with outpt f/u.    Final Clinical Impressions(s) / ED Diagnoses   Final diagnoses:  None    New Prescriptions New Prescriptions   No medications on file     Francine Graven, DO 09/05/16 1856

## 2016-09-05 LAB — URINE CULTURE: Culture: NO GROWTH

## 2016-09-15 ENCOUNTER — Emergency Department (HOSPITAL_COMMUNITY)
Admission: EM | Admit: 2016-09-15 | Discharge: 2016-09-15 | Disposition: A | Discharge status: Home / Self Care | Payer: Medicare HMO | Attending: Emergency Medicine | Admitting: Emergency Medicine

## 2016-09-15 ENCOUNTER — Encounter (HOSPITAL_COMMUNITY): Payer: Self-pay | Admitting: Emergency Medicine

## 2016-09-15 DIAGNOSIS — E039 Hypothyroidism, unspecified: Secondary | ICD-10-CM | POA: Insufficient documentation

## 2016-09-15 DIAGNOSIS — N3000 Acute cystitis without hematuria: Secondary | ICD-10-CM | POA: Diagnosis not present

## 2016-09-15 DIAGNOSIS — Z79899 Other long term (current) drug therapy: Secondary | ICD-10-CM | POA: Diagnosis not present

## 2016-09-15 DIAGNOSIS — Z85828 Personal history of other malignant neoplasm of skin: Secondary | ICD-10-CM | POA: Insufficient documentation

## 2016-09-15 DIAGNOSIS — Z791 Long term (current) use of non-steroidal anti-inflammatories (NSAID): Secondary | ICD-10-CM | POA: Insufficient documentation

## 2016-09-15 DIAGNOSIS — Z7982 Long term (current) use of aspirin: Secondary | ICD-10-CM | POA: Diagnosis not present

## 2016-09-15 DIAGNOSIS — R339 Retention of urine, unspecified: Secondary | ICD-10-CM | POA: Diagnosis present

## 2016-09-15 DIAGNOSIS — Z87891 Personal history of nicotine dependence: Secondary | ICD-10-CM | POA: Insufficient documentation

## 2016-09-15 DIAGNOSIS — I509 Heart failure, unspecified: Secondary | ICD-10-CM | POA: Diagnosis not present

## 2016-09-15 DIAGNOSIS — Z950 Presence of cardiac pacemaker: Secondary | ICD-10-CM | POA: Insufficient documentation

## 2016-09-15 LAB — URINALYSIS, MICROSCOPIC (REFLEX): Squamous Epithelial / LPF: NONE SEEN

## 2016-09-15 LAB — URINALYSIS, ROUTINE W REFLEX MICROSCOPIC
BILIRUBIN URINE: NEGATIVE
GLUCOSE, UA: NEGATIVE mg/dL
KETONES UR: NEGATIVE mg/dL
Nitrite: NEGATIVE
Protein, ur: 30 mg/dL — AB
Specific Gravity, Urine: 1.015 (ref 1.005–1.030)
pH: 6 (ref 5.0–8.0)

## 2016-09-15 MED ORDER — CEPHALEXIN 500 MG PO CAPS: 500.0000 mg | ORAL_CAPSULE | Freq: Four times a day (QID) | ORAL | 0 refills | Status: DC

## 2016-09-15 NOTE — ED Provider Notes (Signed)
[facility] DEPT Provider Note   CSN: [id number] Arrival date & time: 09/15/16  [id number]   By signing my name below, I, Collene Leyden, attest that this documentation has been prepared under the direction and in the presence of Evalee Jefferson, PA-C. Electronically Signed: Collene Leyden, Scribe. 09/15/16. 11:59 AM.  History   Chief Complaint Chief Complaint  Patient presents with  . Urinary Retention    HPI Comments: Andre Holder is a 83 y.o. male with a hx of CHF, PVD, adrenal hyperplasia, and ASCVD, who presents to the Emergency Department complaining of constant urethral burning that began one week ago. Patient developed urinary retention last month and is still wearing a foley catheter in anticipation of a urology referral on 10/03/16.  He last had the foley replaced by his pcp last week.  Patient reports associated urethral stinging and chronic back pain.  He denies any abdominal pain, vomiting or fever and has had no hematuria.   The history is provided by the patient. No language interpreter was used.    Past Medical History:  Diagnosis Date  . Adrenal hyperplasia ([place])    Stable on serial imaging  . Anemia    minimal in 2011 with hemoglobin of 12.2 and high normal MCV  . Arteriosclerotic cardiovascular disease (ASCVD)    Nonobstructive; 09/2008 50% proximal and 40% mid LAD; 25% circumflex; 30% RCA; mild global LV dysfunction with EF of 45%. No aortic stenosis.  . Borderline hypertension    Normal CMet in 2011  . Cancer of larynx ([place])    laryngectomy in 1988; postoperative radiation therapy  . Carotid stenosis   . 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 Degenerative joint disease    s/p bilateral TKR  . GERD (gastroesophageal reflux disease)   . Hyperlipidemia    Lipid profile in 04/2010:115, 98, 43, 52.  Marland Kitchen Hypothyroidism   . Mild aortic stenosis    not documented at catheterization; verified by echo in 2011  . Mitral regurgitation    . Mobitz (type) II atrioventricular block    With bradycardia; Medtronic pacemaker implanted in 09/2008  . Peripheral vascular disease ([place])    With a 70% innominate artery stenosis and nonobstructive carotid stenosis  . Skin cancer   . Small bowel obstruction   . Tobacco abuse, in remission    Remote  . Weight loss    50 pounds between 1991 and 2011    Patient Active Problem List   Diagnosis Date Noted  . Shortness of breath 01/18/2016  . Edema 01/18/2016  . Protein calorie malnutrition ([place]) 01/18/2016  . Acute on chronic systolic congestive heart failure ([place])   . Edema extremities   . Partial small bowel obstruction 11/05/2015  . Anemia 11/05/2015  . Malnutrition of moderate degree ([place]) 01/11/2015  . Small bowel obstruction 12/23/2014  . Chronic systolic congestive heart failure, NYHA class 2 ([place]) 12/23/2014  . SBO (small bowel obstruction) ([place]) 12/23/2014  . CHF exacerbation ([place]) 08/31/2014  . Acute on chronic systolic CHF (congestive heart failure) ([place]) 08/31/2014  . Chest pain 08/31/2014  . Dyspnea 08/31/2014  . CHF (congestive heart failure) ([place]) 08/25/2013  . Syncope 10/09/2011  . Hyponatremia 10/09/2011  . GERD 06/17/2010  . WEIGHT LOSS, ABNORMAL 06/17/2010  . ANEMIA 04/08/2010  . Mobitz type II atrioventricular block 04/08/2010  . Hyperlipidemia 10/24/2009  . PACEMAKER, PERMANENT 09/20/2009  . Malignant neoplasm of larynx ([place]) 03/07/2009  . Aortic valve disorder  03/07/2009  . CEREBROVASCULAR DISEASE 03/07/2009  . PERIPHERAL VASCULAR DISEASE 03/07/2009  . Tobacco abuse, in remission 03/07/2009  . Hypothyroidism 12/27/2008  . OSTEOARTHRITIS 12/27/2008    Past Surgical History:  Procedure Laterality Date  . APPENDECTOMY  1973  . CATARACT EXTRACTION, BILATERAL    . DECOMPRESSION FACIAL NERVE     Right median  . INSERT / REPLACE / REMOVE PACEMAKER    . KNEE ARTHROSCOPY     Left  . LARYNGECTOMY  1988   S/P laryngectomy and radiation therapy  .  PACEMAKER INSERTION    . TOTAL KNEE ARTHROPLASTY     Bilateral, 19 years ago       Home Medications    Prior to Admission medications   Medication Sig Start Date End Date Taking? Authorizing Provider  Ascorbic Acid (VITAMIN C PO) Take 1 tablet by mouth daily.   Yes Historical Provider, MD  aspirin EC 81 MG tablet Take 81 mg by mouth daily.   Yes Historical Provider, MD  atorvastatin (LIPITOR) 10 MG tablet Take 10 mg by mouth at bedtime.    Yes Historical Provider, MD  Cholecalciferol (VITAMIN D PO) Take 1 tablet by mouth daily.   Yes Historical Provider, MD  Coenzyme Q10 (CO Q 10 PO) Take 1 tablet by mouth daily.    Yes Historical Provider, MD  Cyanocobalamin (VITAMIN B-12 PO) Take 1 tablet by mouth daily.   Yes Historical Provider, MD  finasteride (PROSCAR) 5 MG tablet Take 5 mg by mouth at bedtime.   Yes Historical Provider, MD  guaifenesin (MUCUS RELIEF) 400 MG TABS tablet Take 400 mg by mouth every 4 (four) hours as needed (for mucus).   Yes Historical Provider, MD  levothyroxine (SYNTHROID, LEVOTHROID) 200 MCG tablet Take 200 mcg by mouth daily before breakfast.   Yes Historical Provider, MD  magnesium oxide (MAG-OX) 400 MG tablet Take 400 mg by mouth 2 (two) times daily.   Yes Historical Provider, MD  Melatonin 10 MG CAPS Take 1 capsule by mouth at bedtime.    Yes Historical Provider, MD  pantoprazole (PROTONIX) 40 MG tablet Take 40 mg by mouth 2 (two) times daily.   Yes Historical Provider, MD  potassium chloride (K-DUR) 10 MEQ tablet Take 1 tablet (10 mEq total) by mouth 2 (two) times daily. Patient taking differently: Take 10 mEq by mouth daily.  01/23/16  Yes Asencion Noble, MD  Resveratrol 250 MG CAPS Take 1 capsule by mouth 2 (two) times daily.   Yes Historical Provider, MD  ropinirole (REQUIP) 5 MG tablet Take 5 mg by mouth at bedtime.   Yes Historical Provider, MD  Tamsulosin HCl (FLOMAX) 0.4 MG CAPS Take 0.4 mg by mouth 2 (two) times daily.    Yes Historical Provider, MD    torsemide (DEMADEX) 20 MG tablet Take 1 tablet (20 mg total) by mouth daily. 08/21/16  Yes Asencion Noble, MD  cephALEXin (KEFLEX) 500 MG capsule Take 1 capsule (500 mg total) by mouth 4 (four) times daily. 09/15/16   Evalee Jefferson, PA-C  diclofenac sodium (VOLTAREN) 1 % GEL Apply 2 g topically daily as needed (pain).    Historical Provider, MD  HYDROcodone-acetaminophen (NORCO/VICODIN) 5-325 MG tablet Take 2 tablets by mouth every 4 (four) hours as needed. Patient taking differently: Take 2 tablets by mouth every 4 (four) hours as needed for moderate pain.  12/24/15   [name], MD  triamcinolone (KENALOG) 0.025 % ointment Apply 1 application topically daily as needed. Skin irritation 07/24/16  Historical Provider, MD    Family History Family History  Problem Relation Age of Onset  . Stroke Mother   . Leukemia Father   . Stroke Other   . Diabetes Other   . Colon cancer Neg Hx   . Liver disease Neg Hx   . GI problems Neg Hx     Social History Social History  Substance Use Topics  . Smoking status: Former Smoker    Types: Cigarettes  . Smokeless tobacco: Former Systems developer    Quit date: 09/08/1982     Comment: Quit 30 years  . Alcohol use No     Allergies   Patient has no known allergies.   Review of Systems Review of Systems  Constitutional: Negative for fever.  Gastrointestinal: Negative for abdominal pain and vomiting.  Genitourinary: Positive for dysuria and penile pain. Negative for decreased urine volume, discharge, hematuria, scrotal swelling and testicular pain.  Musculoskeletal: Positive for back pain.     Physical Exam Updated Vital Signs BP 119/82   Pulse 64   Temp 97.9 F (36.6 C) (Oral)   Resp 21   Ht 6\' 1"  (1.854 m)   Wt 72.1 kg   SpO2 96%   BMI 20.98 kg/m   Physical Exam  Constitutional: He is oriented to person, place, and time. He appears well-developed and well-nourished.  HENT:  Head: Normocephalic and atraumatic.  Eyes: Conjunctivae and EOM are  normal. Pupils are equal, round, and reactive to light.  Neck: Normal range of motion. Neck supple.  Cardiovascular: Normal rate.   Pulmonary/Chest: Effort normal.  Abdominal: Soft. He exhibits no distension. There is no tenderness.  Genitourinary:  Genitourinary Comments: Patient has 100 cc of urine in his catheter bag, non bloody, cloudy.  Musculoskeletal: Normal range of motion.  Neurological: He is alert and oriented to person, place, and time.  Skin: Skin is warm and dry.  Psychiatric: He has a normal mood and affect.  Nursing note and vitals reviewed.    ED Treatments / Results  DIAGNOSTIC STUDIES: Oxygen Saturation is 94% on RA, normal by my interpretation.    COORDINATION OF CARE: 11:58 AM Discussed treatment plan with pt at bedside and pt agreed to plan.  Labs (all labs ordered are listed, but only abnormal results are displayed) Labs Reviewed  URINE CULTURE - Abnormal; Notable for the following:       Result Value   Culture >=100,000 COLONIES/mL PROTEUS MIRABILIS (*)    All other components within normal limits  URINALYSIS, ROUTINE W REFLEX MICROSCOPIC - Abnormal; Notable for the following:    APPearance HAZY (*)    Hgb urine dipstick MODERATE (*)    Protein, ur 30 (*)    Leukocytes, UA LARGE (*)    All other components within normal limits  URINALYSIS, MICROSCOPIC (REFLEX) - Abnormal; Notable for the following:    Bacteria, UA MANY (*)    All other components within normal limits    EKG  EKG Interpretation None       Radiology No results found.  Procedures Procedures (including critical care time)  Medications Ordered in ED Medications - No data to display   Initial Impression / Assessment and Plan / ED Course  I have reviewed the triage vital signs and the nursing notes.  Pertinent labs & imaging results that were available during my care of the patient were reviewed by me and considered in my medical decision making (see chart for  details).  Clinical Course  Uti, cx pending.  Review of chart - urine cx from 12/27 negative for growth.  Sample obtained today from new bag.  Pt placed on keflex, advised close f/u with pcp and/or urology. Denies fevers, no n/v or weakness to suggest urosepsis.  Pt was seen by Dr. Wilson Singer prior to dc home.  Final Clinical Impressions(s) / ED Diagnoses   Final diagnoses:  Acute cystitis without hematuria    New Prescriptions Discharge Medication List as of 09/15/2016 12:19 PM    START taking these medications   Details  cephALEXin (KEFLEX) 500 MG capsule Take 1 capsule (500 mg total) by mouth 4 (four) times daily., Starting Mon 09/15/2016, Print       I personally performed the services described in this documentation, which was scribed in my presence. The recorded information has been reviewed and is accurate.     Evalee Jefferson, PA-C 09/17/16 1243    Virgel Manifold, MD 09/19/16 [phone]

## 2016-09-15 NOTE — ED Notes (Addendum)
Pt's foley leg drainage bag filling with couldy odorous urine.  Pt reports burning to urethra since 2am. Wife says pt was admitted and treated for CHF 2 weeks ago and discharged home with foley catheter while being treated with lasix daily.  Pt returned to [facility] with foley bag leaking 1 week ago and was discharged from [facility] with replacement leg bag per wife.

## 2016-09-15 NOTE — ED Triage Notes (Signed)
PT states decreased urine output from his indwelling catheter with lower abdominal pressure starting yesterday evening. PT has urology apt on 10/03/16 for catheter follow-up. PT denies any fevers.

## 2016-09-18 LAB — URINE CULTURE: Culture: >=100000 — AB

## 2016-09-19 ENCOUNTER — Telehealth (HOSPITAL_BASED_OUTPATIENT_CLINIC_OR_DEPARTMENT_OTHER): Payer: Self-pay

## 2016-09-19 NOTE — Telephone Encounter (Signed)
Post ED Visit - Positive Culture Follow-up: Successful Patient Follow-Up  Culture assessed and recommendations reviewed by: []  Elenor Quinones, Pharm.D. []  Heide Guile, Pharm.D., BCPS []  Parks Neptune, Pharm.D. []  Alycia Rossetti, Pharm.D., BCPS []  [place], Pharm.D., BCPS, AAHIVP []  Legrand Como, Pharm.D., BCPS, AAHIVP []  Milus Glazier, Pharm.D. []  Rob Privateer, [place].DStanford Breed, Pharm.D.  Positive urine culture, >/= 100,000 colonies -> Proteus Mirabilis  []  Patient discharged without antimicrobial prescription and treatment is now indicated [x]  Organism is resistant to prescribed ED discharge antimicrobial(Cephalexin) []  Patient with positive blood cultures  Changes discussed with ED provider: Harlene Ramus PA-C "Stop Cephalexin, Start Bactrim DS 1 tab po BID x 10 days"  Called to [facility] and Rx given to [place] patient, date 09/19/16, time 58 Pt wife informed(Pt hard of hearing).   Dortha Kern 09/19/2016, 2:49 PM

## 2016-09-19 NOTE — Progress Notes (Signed)
ED Antimicrobial Stewardship Positive Culture Follow Up   Andre Holder is an 83 y.o. male who presented to [facility] on 09/15/2016 with a chief complaint of  Chief Complaint  Patient presents with  . Urinary Retention    Recent Results (from the past 720 hour(s))  Urine culture     Status: Abnormal   Collection Time: 08/23/16  9:24 AM  Result Value Ref Range Status   Specimen Description URINE, CLEAN CATCH  Final   Special Requests NONE  Final   Culture >=100,000 COLONIES/mL PROTEUS MIRABILIS (A)  Final   Report Status 08/26/2016 FINAL  Final   Organism ID, Bacteria PROTEUS MIRABILIS (A)  Final      Susceptibility   Proteus mirabilis - MIC*    AMPICILLIN >=32 RESISTANT Resistant     CEFAZOLIN >=64 RESISTANT Resistant     CEFTRIAXONE <=1 RESISTANT Resistant     CIPROFLOXACIN <=0.25 SENSITIVE Sensitive     GENTAMICIN <=1 SENSITIVE Sensitive     IMIPENEM 8 INTERMEDIATE Intermediate     NITROFURANTOIN 128 RESISTANT Resistant     TRIMETH/SULFA <=20 SENSITIVE Sensitive     AMPICILLIN/SULBACTAM >=32 RESISTANT Resistant     PIP/TAZO <=4 SENSITIVE Sensitive     * >=100,000 COLONIES/mL PROTEUS MIRABILIS  Urine culture     Status: None   Collection Time: 09/03/16  5:00 PM  Result Value Ref Range Status   Specimen Description URINE, CLEAN CATCH  Final   Special Requests NONE  Final   Culture NO GROWTH Performed at [facility]   Final   Report Status 09/05/2016 FINAL  Final  Urine culture     Status: Abnormal   Collection Time: 09/15/16 11:07 AM  Result Value Ref Range Status   Specimen Description URINE, RANDOM  Final   Special Requests NONE  Final   Culture >=100,000 COLONIES/mL PROTEUS MIRABILIS (A)  Final   Report Status 09/18/2016 FINAL  Final   Organism ID, Bacteria PROTEUS MIRABILIS (A)  Final      Susceptibility   Proteus mirabilis - MIC*    AMPICILLIN >=32 RESISTANT Resistant     CEFAZOLIN >=64 RESISTANT Resistant     CEFTRIAXONE <=1 RESISTANT Resistant    CIPROFLOXACIN <=0.25 SENSITIVE Sensitive     GENTAMICIN <=1 SENSITIVE Sensitive     IMIPENEM 8 INTERMEDIATE Intermediate     NITROFURANTOIN 128 RESISTANT Resistant     TRIMETH/SULFA <=20 SENSITIVE Sensitive     AMPICILLIN/SULBACTAM >=32 RESISTANT Resistant     PIP/TAZO <=4 SENSITIVE Sensitive     * >=100,000 COLONIES/mL PROTEUS MIRABILIS    [x]  Treated with cephalexin, organism resistant to prescribed antimicrobial  New antibiotic prescription: STOP cephalexin  START Bactrim DS - 1 tab PO BID x 10 days  ED Provider: Colen Darling, PA-C  [name] 09/19/2016, 9:29 AM Infectious Diseases Pharmacist Phone# [phone]

## 2016-09-22 ENCOUNTER — Encounter (HOSPITAL_COMMUNITY): Payer: Self-pay | Admitting: Cardiology

## 2016-09-22 ENCOUNTER — Emergency Department (HOSPITAL_COMMUNITY)
Admission: EM | Admit: 2016-09-22 | Discharge: 2016-09-22 | Disposition: A | Discharge status: Home / Self Care | Payer: Medicare HMO | Attending: Emergency Medicine | Admitting: Emergency Medicine

## 2016-09-22 DIAGNOSIS — Z7689 Persons encountering health services in other specified circumstances: Secondary | ICD-10-CM

## 2016-09-22 DIAGNOSIS — Z87891 Personal history of nicotine dependence: Secondary | ICD-10-CM | POA: Diagnosis not present

## 2016-09-22 DIAGNOSIS — Y69 Unspecified misadventure during surgical and medical care: Secondary | ICD-10-CM | POA: Insufficient documentation

## 2016-09-22 DIAGNOSIS — R339 Retention of urine, unspecified: Secondary | ICD-10-CM | POA: Diagnosis present

## 2016-09-22 DIAGNOSIS — E039 Hypothyroidism, unspecified: Secondary | ICD-10-CM | POA: Diagnosis not present

## 2016-09-22 DIAGNOSIS — Z950 Presence of cardiac pacemaker: Secondary | ICD-10-CM | POA: Diagnosis not present

## 2016-09-22 DIAGNOSIS — Z8521 Personal history of malignant neoplasm of larynx: Secondary | ICD-10-CM | POA: Diagnosis not present

## 2016-09-22 DIAGNOSIS — Z7982 Long term (current) use of aspirin: Secondary | ICD-10-CM | POA: Diagnosis not present

## 2016-09-22 DIAGNOSIS — T83098A Other mechanical complication of other indwelling urethral catheter, initial encounter: Secondary | ICD-10-CM | POA: Insufficient documentation

## 2016-09-22 DIAGNOSIS — I5023 Acute on chronic systolic (congestive) heart failure: Secondary | ICD-10-CM | POA: Diagnosis not present

## 2016-09-22 LAB — I-STAT CHEM 8, ED
BUN: 27 mg/dL — AB (ref 6–20)
CALCIUM ION: 1.14 mmol/L — AB (ref 1.15–1.40)
Chloride: 99 mmol/L — ABNORMAL LOW (ref 101–111)
Creatinine, Ser: 1.2 mg/dL (ref 0.61–1.24)
Glucose, Bld: 98 mg/dL (ref 65–99)
HEMATOCRIT: 36 % — AB (ref 39.0–52.0)
Hemoglobin: 12.2 g/dL — ABNORMAL LOW (ref 13.0–17.0)
Potassium: 4.3 mmol/L (ref 3.5–5.1)
SODIUM: 136 mmol/L (ref 135–145)
TCO2: 26 mmol/L (ref 0–100)

## 2016-09-22 NOTE — ED Provider Notes (Signed)
[facility] DEPT Provider Note   CSN: [id number] Arrival date & time: 09/22/16  [id number]     History   Chief Complaint Chief Complaint  Patient presents with  . Urinary Retention    HPI QUENTAVIOUS ZOMBEK is a 83 y.o. male.  HPI 83 year old male who presents with concern for Foley catheter complication. He has a history of urinary retention with Foley catheter placement September 03, 2017. He has history of urinary retention requiring Foley catheters in the past, and has follow-up appointment with urology on 10/03/2016. Has been seen in the ED on January 8 for stinging and irritation of the urethra around the Foley catheter, and at that time had urinalysis suggestive of UTI. Was placed on Keflex, but urine culture grew Proteus that was not susceptible to cephalosporins. He states that he was called and prescribed new antibiotic that covers this bacteria and took his first dose today. He has not had fevers, nausea or vomiting, significant abdominal pain. This morning noticed that he was having a little leakage around his Foley catheter. Was also having stinging and irritation again around the urethra with a Foley catheter was placed. Returns to ED for Foley catheter evaluation.   Past Medical History:  Diagnosis Date  . Adrenal hyperplasia ([place])    Stable on serial imaging  . Anemia    minimal in 2011 with hemoglobin of 12.2 and high normal MCV  . Arteriosclerotic cardiovascular disease (ASCVD)    Nonobstructive; 09/2008 50% proximal and 40% mid LAD; 25% circumflex; 30% RCA; mild global LV dysfunction with EF of 45%. No aortic stenosis.  . Borderline hypertension    Normal CMet in 2011  . Cancer of larynx ([place])    laryngectomy in 1988; postoperative radiation therapy  . Carotid stenosis   . 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 Degenerative joint disease    s/p bilateral TKR  . GERD (gastroesophageal reflux disease)   . Hyperlipidemia    Lipid profile in 04/2010:115, 98, 43, 52.  Marland Kitchen Hypothyroidism   . Mild aortic stenosis    not documented at catheterization; verified by echo in 2011  . Mitral regurgitation   . Mobitz (type) II atrioventricular block    With bradycardia; Medtronic pacemaker implanted in 09/2008  . Peripheral vascular disease ([place])    With a 70% innominate artery stenosis and nonobstructive carotid stenosis  . Skin cancer   . Small bowel obstruction   . Tobacco abuse, in remission    Remote  . Weight loss    50 pounds between 1991 and 2011    Patient Active Problem List   Diagnosis Date Noted  . Shortness of breath 01/18/2016  . Edema 01/18/2016  . Protein calorie malnutrition ([place]) 01/18/2016  . Acute on chronic systolic congestive heart failure ([place])   . Edema extremities   . Partial small bowel obstruction 11/05/2015  . Anemia 11/05/2015  . Malnutrition of moderate degree ([place]) 01/11/2015  . Small bowel obstruction 12/23/2014  . Chronic systolic congestive heart failure, NYHA class 2 ([place]) 12/23/2014  . SBO (small bowel obstruction) ([place]) 12/23/2014  . CHF exacerbation ([place]) 08/31/2014  . Acute on chronic systolic CHF (congestive heart failure) ([place]) 08/31/2014  . Chest pain 08/31/2014  . Dyspnea 08/31/2014  . CHF (congestive heart failure) ([place]) 08/25/2013  . Syncope 10/09/2011  . Hyponatremia 10/09/2011  . GERD 06/17/2010  . WEIGHT LOSS, ABNORMAL 06/17/2010  . ANEMIA 04/08/2010  . Mobitz type II  atrioventricular block 04/08/2010  . Hyperlipidemia 10/24/2009  . PACEMAKER, PERMANENT 09/20/2009  . Malignant neoplasm of larynx ([place]) 03/07/2009  . Aortic valve disorder 03/07/2009  . CEREBROVASCULAR DISEASE 03/07/2009  . PERIPHERAL VASCULAR DISEASE 03/07/2009  . Tobacco abuse, in remission 03/07/2009  . Hypothyroidism 12/27/2008  . OSTEOARTHRITIS 12/27/2008    Past Surgical History:  Procedure Laterality Date  . APPENDECTOMY  1973  . CATARACT EXTRACTION, BILATERAL    . DECOMPRESSION  FACIAL NERVE     Right median  . INSERT / REPLACE / REMOVE PACEMAKER    . KNEE ARTHROSCOPY     Left  . LARYNGECTOMY  1988   S/P laryngectomy and radiation therapy  . PACEMAKER INSERTION    . TOTAL KNEE ARTHROPLASTY     Bilateral, 19 years ago       Home Medications    Prior to Admission medications   Medication Sig Start Date End Date Taking? Authorizing Provider  Ascorbic Acid (VITAMIN C PO) Take 1 tablet by mouth daily.    Historical Provider, MD  aspirin EC 81 MG tablet Take 81 mg by mouth daily.    Historical Provider, MD  atorvastatin (LIPITOR) 10 MG tablet Take 10 mg by mouth at bedtime.     Historical Provider, MD  cephALEXin (KEFLEX) 500 MG capsule Take 1 capsule (500 mg total) by mouth 4 (four) times daily. 09/15/16   Evalee Jefferson, PA-C  Cholecalciferol (VITAMIN D PO) Take 1 tablet by mouth daily.    Historical Provider, MD  Coenzyme Q10 (CO Q 10 PO) Take 1 tablet by mouth daily.     Historical Provider, MD  Cyanocobalamin (VITAMIN B-12 PO) Take 1 tablet by mouth daily.    Historical Provider, MD  diclofenac sodium (VOLTAREN) 1 % GEL Apply 2 g topically daily as needed (pain).    Historical Provider, MD  finasteride (PROSCAR) 5 MG tablet Take 5 mg by mouth at bedtime.    Historical Provider, MD  guaifenesin (MUCUS RELIEF) 400 MG TABS tablet Take 400 mg by mouth every 4 (four) hours as needed (for mucus).    Historical Provider, MD  HYDROcodone-acetaminophen (NORCO/VICODIN) 5-325 MG tablet Take 2 tablets by mouth every 4 (four) hours as needed. Patient taking differently: Take 2 tablets by mouth every 4 (four) hours as needed for moderate pain.  12/24/15   [name], MD  levothyroxine (SYNTHROID, LEVOTHROID) 200 MCG tablet Take 200 mcg by mouth daily before breakfast.    Historical Provider, MD  magnesium oxide (MAG-OX) 400 MG tablet Take 400 mg by mouth 2 (two) times daily.    Historical Provider, MD  Melatonin 10 MG CAPS Take 1 capsule by mouth at bedtime.     Historical  Provider, MD  pantoprazole (PROTONIX) 40 MG tablet Take 40 mg by mouth 2 (two) times daily.    Historical Provider, MD  potassium chloride (K-DUR) 10 MEQ tablet Take 1 tablet (10 mEq total) by mouth 2 (two) times daily. Patient taking differently: Take 10 mEq by mouth daily.  01/23/16   Asencion Noble, MD  Resveratrol 250 MG CAPS Take 1 capsule by mouth 2 (two) times daily.    Historical Provider, MD  ropinirole (REQUIP) 5 MG tablet Take 5 mg by mouth at bedtime.    Historical Provider, MD  Tamsulosin HCl (FLOMAX) 0.4 MG CAPS Take 0.4 mg by mouth 2 (two) times daily.     Historical Provider, MD  torsemide (DEMADEX) 20 MG tablet Take 1 tablet (20 mg total) by mouth daily.  08/21/16   Asencion Noble, MD  triamcinolone (KENALOG) 0.025 % ointment Apply 1 application topically daily as needed. Skin irritation 07/24/16   Historical Provider, MD    Family History Family History  Problem Relation Age of Onset  . Stroke Mother   . Leukemia Father   . Stroke Other   . Diabetes Other   . Colon cancer Neg Hx   . Liver disease Neg Hx   . GI problems Neg Hx     Social History Social History  Substance Use Topics  . Smoking status: Former Smoker    Types: Cigarettes  . Smokeless tobacco: Former Systems developer    Quit date: 09/08/1982     Comment: Quit 30 years  . Alcohol use No     Allergies   Patient has no known allergies.   Review of Systems Review of Systems 10/14 systems reviewed and are negative other than those stated in the HPI   Physical Exam Updated Vital Signs BP 134/73 (BP Location: Left Arm)   Pulse 79   Temp 97.9 F (36.6 C) (Oral)   Resp 18   Ht 6\' 2"  (1.88 m)   Wt 159 lb (72.1 kg)   SpO2 96%   BMI 20.41 kg/m   Physical Exam Physical Exam  Nursing note and vitals reviewed. Constitutional: Elderly man sitting comfortably in ED gurney, non-toxic, and in no acute distress Head: Normocephalic and atraumatic.  Mouth/Throat: Oropharynx is clear and moist.  Neck: Normal range of  motion. Neck supple.  Cardiovascular: Normal rate and regular rhythm.   Pulmonary/Chest: Effort normal and breath sounds normal.  Abdominal: Soft. There is no tenderness. There is no rebound and no guarding.  GU: foley catheter in placed w/o leakage Musculoskeletal: Normal range of motion.  Neurological: Alert, no facial droop, fluent speech, moves all extremities symmetrically Skin: Skin is warm and dry.  Psychiatric: Cooperative   ED Treatments / Results  Labs (all labs ordered are listed, but only abnormal results are displayed) Labs Reviewed  I-STAT CHEM 8, ED - Abnormal; Notable for the following:       Result Value   Chloride 99 (*)    BUN 27 (*)    Calcium, Ion 1.14 (*)    Hemoglobin 12.2 (*)    HCT 36.0 (*)    All other components within normal limits    EKG  EKG Interpretation None       Radiology No results found.  Procedures Procedures (including critical care time)  Medications Ordered in ED Medications - No data to display   Initial Impression / Assessment and Plan / ED Course  I have reviewed the triage vital signs and the nursing notes.  Pertinent labs & imaging results that were available during my care of the patient were reviewed by me and considered in my medical decision making (see chart for details).  Clinical Course     Presenting with concern for leakage around foley and irritation of urethra by foley. He is well appearing. Vitals stable. Abdomen benign. Urinary catheter flushed with appropriate urine output. No systemic signs or symptoms of illness or sepsis. Patient just received antibiotic that his recent urine culture is susceptible to, and will continue. Discussed follow-up with urologist 1/26 as scheduled. Strict return and follow-up instructions reviewed. They expressed understanding of all discharge instructions and felt comfortable with the plan of care.   Final Clinical Impressions(s) / ED Diagnoses   Final diagnoses:    Encounter for assessment of Foley catheter  New Prescriptions New Prescriptions   No medications on file     Forde Dandy, MD 09/22/16 [phone]

## 2016-09-22 NOTE — Discharge Instructions (Signed)
Your urinary catheter is draining appropriately today.  Please call your urologist to see if appointment can be moved or follow-up on 10/03/2016 as scheduled.  Return for worsening symptoms, including fever, severe abdominal pain, intractable vomiting or any other symptoms concerning to you.

## 2016-09-22 NOTE — ED Notes (Signed)
Pt catheter flushed with no issues noted. MD made aware.

## 2016-09-22 NOTE — ED Triage Notes (Signed)
Pt has foley catheter in.  C/o burning and pain around penis.  Has appointment with urology 10/03/16.

## 2016-10-03 ENCOUNTER — Ambulatory Visit (INDEPENDENT_AMBULATORY_CARE_PROVIDER_SITE_OTHER): Payer: Medicare HMO | Admitting: Urology

## 2016-10-03 ENCOUNTER — Other Ambulatory Visit (HOSPITAL_COMMUNITY)
Admission: RE | Admit: 2016-10-03 | Discharge: 2016-10-03 | Disposition: A | Discharge status: Home / Self Care | Payer: Medicare Other | Source: Other Acute Inpatient Hospital | Attending: Internal Medicine | Admitting: Internal Medicine

## 2016-10-03 DIAGNOSIS — R338 Other retention of urine: Secondary | ICD-10-CM

## 2016-10-07 LAB — URINE CULTURE: Culture: >=100000 — AB

## 2016-10-21 ENCOUNTER — Other Ambulatory Visit (HOSPITAL_COMMUNITY)
Admission: RE | Admit: 2016-10-21 | Discharge: 2016-10-21 | Disposition: A | Discharge status: Home / Self Care | Payer: Medicare Other | Source: Other Acute Inpatient Hospital | Attending: Urology | Admitting: Urology

## 2016-10-21 DIAGNOSIS — R338 Other retention of urine: Secondary | ICD-10-CM | POA: Insufficient documentation

## 2016-10-21 LAB — URINALYSIS, COMPLETE (UACMP) WITH MICROSCOPIC
BILIRUBIN URINE: NEGATIVE
GLUCOSE, UA: NEGATIVE mg/dL
HGB URINE DIPSTICK: NEGATIVE
Ketones, ur: NEGATIVE mg/dL
Leukocytes, UA: NEGATIVE
NITRITE: NEGATIVE
PH: 5 (ref 5.0–8.0)
Protein, ur: NEGATIVE mg/dL
SPECIFIC GRAVITY, URINE: 1.008 (ref 1.005–1.030)

## 2016-10-23 LAB — URINE CULTURE: Culture: NO GROWTH

## 2016-12-08 ENCOUNTER — Encounter (HOSPITAL_COMMUNITY): Payer: Self-pay | Admitting: Emergency Medicine

## 2016-12-08 ENCOUNTER — Emergency Department (HOSPITAL_COMMUNITY)

## 2016-12-08 ENCOUNTER — Emergency Department (HOSPITAL_COMMUNITY)
Admission: EM | Admit: 2016-12-08 | Discharge: 2016-12-08 | Disposition: A | Discharge status: Home / Self Care | Attending: Emergency Medicine | Admitting: Emergency Medicine

## 2016-12-08 DIAGNOSIS — R4182 Altered mental status, unspecified: Secondary | ICD-10-CM | POA: Diagnosis present

## 2016-12-08 DIAGNOSIS — R410 Disorientation, unspecified: Secondary | ICD-10-CM | POA: Diagnosis not present

## 2016-12-08 DIAGNOSIS — E039 Hypothyroidism, unspecified: Secondary | ICD-10-CM | POA: Insufficient documentation

## 2016-12-08 DIAGNOSIS — Z87891 Personal history of nicotine dependence: Secondary | ICD-10-CM | POA: Diagnosis not present

## 2016-12-08 DIAGNOSIS — Z85820 Personal history of malignant melanoma of skin: Secondary | ICD-10-CM | POA: Diagnosis not present

## 2016-12-08 DIAGNOSIS — Z7982 Long term (current) use of aspirin: Secondary | ICD-10-CM | POA: Insufficient documentation

## 2016-12-08 DIAGNOSIS — Z8521 Personal history of malignant neoplasm of larynx: Secondary | ICD-10-CM | POA: Diagnosis not present

## 2016-12-08 DIAGNOSIS — I5022 Chronic systolic (congestive) heart failure: Secondary | ICD-10-CM | POA: Insufficient documentation

## 2016-12-08 LAB — URINALYSIS, ROUTINE W REFLEX MICROSCOPIC
Bilirubin Urine: NEGATIVE
Glucose, UA: NEGATIVE mg/dL
Hgb urine dipstick: NEGATIVE
Ketones, ur: NEGATIVE mg/dL
LEUKOCYTES UA: NEGATIVE
NITRITE: NEGATIVE
PH: 8 (ref 5.0–8.0)
Protein, ur: NEGATIVE mg/dL
SPECIFIC GRAVITY, URINE: 1.014 (ref 1.005–1.030)

## 2016-12-08 LAB — COMPREHENSIVE METABOLIC PANEL
ALBUMIN: 3.3 g/dL — AB (ref 3.5–5.0)
ALK PHOS: 71 U/L (ref 38–126)
ALT: 16 U/L — AB (ref 17–63)
ANION GAP: 6 (ref 5–15)
AST: 29 U/L (ref 15–41)
BUN: 23 mg/dL — ABNORMAL HIGH (ref 6–20)
CALCIUM: 9 mg/dL (ref 8.9–10.3)
CO2: 27 mmol/L (ref 22–32)
Chloride: 101 mmol/L (ref 101–111)
Creatinine, Ser: 0.69 mg/dL (ref 0.61–1.24)
GFR calc Af Amer: >60 mL/min (ref >60)
GFR calc non Af Amer: >60 mL/min (ref >60)
GLUCOSE: 109 mg/dL — AB (ref 65–99)
Potassium: 4.5 mmol/L (ref 3.5–5.1)
Sodium: 134 mmol/L — ABNORMAL LOW (ref 135–145)
Total Bilirubin: 0.9 mg/dL (ref 0.3–1.2)
Total Protein: 6.4 g/dL — ABNORMAL LOW (ref 6.5–8.1)

## 2016-12-08 LAB — CBC WITH DIFFERENTIAL/PLATELET
BASOS ABS: 0 10*3/uL (ref 0.0–0.1)
BASOS PCT: 0 %
Eosinophils Absolute: 0.2 10*3/uL (ref 0.0–0.7)
Eosinophils Relative: 2 %
HEMATOCRIT: 32.5 % — AB (ref 39.0–52.0)
HEMOGLOBIN: 11.2 g/dL — AB (ref 13.0–17.0)
Lymphocytes Relative: 14 %
Lymphs Abs: 1 10*3/uL (ref 0.7–4.0)
MCH: 34 pg (ref 26.0–34.0)
MCHC: 34.5 g/dL (ref 30.0–36.0)
MCV: 98.8 fL (ref 78.0–100.0)
MONOS PCT: 10 %
Monocytes Absolute: 0.7 10*3/uL (ref 0.1–1.0)
NEUTROS ABS: 5.4 10*3/uL (ref 1.7–7.7)
NEUTROS PCT: 74 %
Platelets: 191 10*3/uL (ref 150–400)
RBC: 3.29 MIL/uL — AB (ref 4.22–5.81)
RDW: 13.4 % (ref 11.5–15.5)
WBC: 7.3 10*3/uL (ref 4.0–10.5)

## 2016-12-08 LAB — AMMONIA: AMMONIA: 14 umol/L (ref 9–35)

## 2016-12-08 NOTE — ED Notes (Signed)
Pt refused to be catheterized for urine specimen.

## 2016-12-08 NOTE — ED Notes (Signed)
Pt voided, urine sent to lab

## 2016-12-08 NOTE — Discharge Instructions (Signed)
Follow-up with your primary care doctor as needed for problems.  Continue to take all of your usual medications.  Follow-up with the hospice personnel to assist you with your care.

## 2016-12-08 NOTE — ED Triage Notes (Signed)
Per EMS family states that he started acting out of him norm last night stating that he wanted to go home and was combative.  Pt was at home.  Pt was also tearing things up.  He is on hospice care for heart disease.  Pt is hypotensive.  Hospice nurse has come in and states that his blood pressure is lower in the arm due to stroke.  Blood pressure is 113/59  Blood pressure 80/55 250 mL of fluid via IV CBG 108 Negative on the stroke scale per fire department History of throat cancer and tracheotomy

## 2016-12-08 NOTE — ED Provider Notes (Signed)
[facility] DEPT Provider Note   CSN: [id number] Arrival date & time: 12/08/16  1442     History   Chief Complaint Chief Complaint  Patient presents with  . Altered Mental Status    HPI Andre Holder is a 83 y.o. male.  He presents for evaluation of confusion, at home, sent here by EMS by his wife, who did not come with him.  She is under hospice care for end-stage cardiovascular disease.  He has had a tracheostomy, and speaks with a voice appliance, with which, it is difficult to understand him.  He states that he has pain in his lower abdomen.  He denies headache chest pain abdominal pain weakness or dizziness.  He is conversant, he does not complain of anything spontaneously.  There are no other known modifying factors.  HPI  Past Medical History:  Diagnosis Date  . Adrenal hyperplasia ([place])    Stable on serial imaging  . Anemia    minimal in 2011 with hemoglobin of 12.2 and high normal MCV  . Arteriosclerotic cardiovascular disease (ASCVD)    Nonobstructive; 09/2008 50% proximal and 40% mid LAD; 25% circumflex; 30% RCA; mild global LV dysfunction with EF of 45%. No aortic stenosis.  . Borderline hypertension    Normal CMet in 2011  . Cancer of larynx ([place])    laryngectomy in 1988; postoperative radiation therapy  . Carotid stenosis   . 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 Degenerative joint disease    s/p bilateral TKR  . GERD (gastroesophageal reflux disease)   . Hyperlipidemia    Lipid profile in 04/2010:115, 98, 43, 52.  Marland Kitchen Hypothyroidism   . Mild aortic stenosis    not documented at catheterization; verified by echo in 2011  . Mitral regurgitation   . Mobitz (type) II atrioventricular block    With bradycardia; Medtronic pacemaker implanted in 09/2008  . Peripheral vascular disease ([place])    With a 70% innominate artery stenosis and nonobstructive carotid stenosis  . Skin cancer   . Small bowel obstruction   .  Tobacco abuse, in remission    Remote  . Weight loss    50 pounds between 1991 and 2011    Patient Active Problem List   Diagnosis Date Noted  . Shortness of breath 01/18/2016  . Edema 01/18/2016  . Protein calorie malnutrition ([place]) 01/18/2016  . Acute on chronic systolic congestive heart failure ([place])   . Edema extremities   . Partial small bowel obstruction 11/05/2015  . Anemia 11/05/2015  . Malnutrition of moderate degree ([place]) 01/11/2015  . Small bowel obstruction 12/23/2014  . Chronic systolic congestive heart failure, NYHA class 2 ([place]) 12/23/2014  . SBO (small bowel obstruction) ([place]) 12/23/2014  . CHF exacerbation ([place]) 08/31/2014  . Acute on chronic systolic CHF (congestive heart failure) ([place]) 08/31/2014  . Chest pain 08/31/2014  . Dyspnea 08/31/2014  . CHF (congestive heart failure) ([place]) 08/25/2013  . Syncope 10/09/2011  . Hyponatremia 10/09/2011  . GERD 06/17/2010  . WEIGHT LOSS, ABNORMAL 06/17/2010  . ANEMIA 04/08/2010  . Mobitz type II atrioventricular block 04/08/2010  . Hyperlipidemia 10/24/2009  . PACEMAKER, PERMANENT 09/20/2009  . Malignant neoplasm of larynx ([place]) 03/07/2009  . Aortic valve disorder 03/07/2009  . CEREBROVASCULAR DISEASE 03/07/2009  . PERIPHERAL VASCULAR DISEASE 03/07/2009  . Tobacco abuse, in remission 03/07/2009  . Hypothyroidism 12/27/2008  . OSTEOARTHRITIS 12/27/2008    Past Surgical History:  Procedure Laterality Date  .  APPENDECTOMY  1973  . CATARACT EXTRACTION, BILATERAL    . DECOMPRESSION FACIAL NERVE     Right median  . INSERT / REPLACE / REMOVE PACEMAKER    . KNEE ARTHROSCOPY     Left  . LARYNGECTOMY  1988   S/P laryngectomy and radiation therapy  . PACEMAKER INSERTION    . TOTAL KNEE ARTHROPLASTY     Bilateral, 19 years ago       Home Medications    Prior to Admission medications   Medication Sig Start Date End Date Taking? Authorizing Provider  Ascorbic Acid (VITAMIN C PO) Take 1 tablet by mouth daily.     Historical Provider, MD  aspirin EC 81 MG tablet Take 81 mg by mouth daily.    Historical Provider, MD  atorvastatin (LIPITOR) 10 MG tablet Take 10 mg by mouth at bedtime.     Historical Provider, MD  cephALEXin (KEFLEX) 500 MG capsule Take 1 capsule (500 mg total) by mouth 4 (four) times daily. 09/15/16   Evalee Jefferson, PA-C  Cholecalciferol (VITAMIN D PO) Take 1 tablet by mouth daily.    Historical Provider, MD  Coenzyme Q10 (CO Q 10 PO) Take 1 tablet by mouth daily.     Historical Provider, MD  Cyanocobalamin (VITAMIN B-12 PO) Take 1 tablet by mouth daily.    Historical Provider, MD  diclofenac sodium (VOLTAREN) 1 % GEL Apply 2 g topically daily as needed (pain).    Historical Provider, MD  finasteride (PROSCAR) 5 MG tablet Take 5 mg by mouth at bedtime.    Historical Provider, MD  guaifenesin (MUCUS RELIEF) 400 MG TABS tablet Take 400 mg by mouth every 4 (four) hours as needed (for mucus).    Historical Provider, MD  HYDROcodone-acetaminophen (NORCO/VICODIN) 5-325 MG tablet Take 2 tablets by mouth every 4 (four) hours as needed. Patient taking differently: Take 2 tablets by mouth every 4 (four) hours as needed for moderate pain.  12/24/15   [name], MD  levothyroxine (SYNTHROID, LEVOTHROID) 200 MCG tablet Take 200 mcg by mouth daily before breakfast.    Historical Provider, MD  magnesium oxide (MAG-OX) 400 MG tablet Take 400 mg by mouth 2 (two) times daily.    Historical Provider, MD  Melatonin 10 MG CAPS Take 1 capsule by mouth at bedtime.     Historical Provider, MD  pantoprazole (PROTONIX) 40 MG tablet Take 40 mg by mouth 2 (two) times daily.    Historical Provider, MD  potassium chloride (K-DUR) 10 MEQ tablet Take 1 tablet (10 mEq total) by mouth 2 (two) times daily. Patient taking differently: Take 10 mEq by mouth daily.  01/23/16   Asencion Noble, MD  Resveratrol 250 MG CAPS Take 1 capsule by mouth 2 (two) times daily.    Historical Provider, MD  ropinirole (REQUIP) 5 MG tablet Take 5 mg by  mouth at bedtime.    Historical Provider, MD  Tamsulosin HCl (FLOMAX) 0.4 MG CAPS Take 0.4 mg by mouth 2 (two) times daily.     Historical Provider, MD  torsemide (DEMADEX) 20 MG tablet Take 1 tablet (20 mg total) by mouth daily. 08/21/16   Asencion Noble, MD  triamcinolone (KENALOG) 0.025 % ointment Apply 1 application topically daily as needed. Skin irritation 07/24/16   Historical Provider, MD    Family History Family History  Problem Relation Age of Onset  . Stroke Mother   . Leukemia Father   . Stroke Other   . Diabetes Other   . Colon cancer  Neg Hx   . Liver disease Neg Hx   . GI problems Neg Hx     Social History Social History  Substance Use Topics  . Smoking status: Former Smoker    Types: Cigarettes  . Smokeless tobacco: Former Systems developer    Quit date: 09/08/1982     Comment: Quit 30 years  . Alcohol use No     Allergies   Patient has no known allergies.   Review of Systems Review of Systems  All other systems reviewed and are negative.    Physical Exam Updated Vital Signs BP 122/78   Pulse 60   Resp (!) 22   Ht 6\' 2"  (1.88 m)   Wt 160 lb (72.6 kg)   SpO2 95%   BMI 20.54 kg/m   Physical Exam  Constitutional: He is oriented to person, place, and time. He appears well-developed and well-nourished.  HENT:  Head: Normocephalic and atraumatic.  Right Ear: External ear normal.  Left Ear: External ear normal.  Eyes: Conjunctivae and EOM are normal. Pupils are equal, round, and reactive to light.  Neck: Normal range of motion and phonation normal. Neck supple.  Tracheostomy site, appears normal.  Cardiovascular: Normal rate, regular rhythm and normal heart sounds.   Pulmonary/Chest: Effort normal and breath sounds normal. He exhibits no bony tenderness.  Abdominal: Soft. There is no tenderness.  Musculoskeletal: Normal range of motion.  Neurological: He is alert and oriented to person, place, and time. No cranial nerve deficit or sensory deficit. He exhibits  normal muscle tone. Coordination normal.  Skin: Skin is warm, dry and intact.  Psychiatric: He has a normal mood and affect. His behavior is normal. Judgment and thought content normal.  Nursing note and vitals reviewed.    ED Treatments / Results  Labs (all labs ordered are listed, but only abnormal results are displayed) Labs Reviewed  COMPREHENSIVE METABOLIC PANEL - Abnormal; Notable for the following:       Result Value   Sodium 134 (*)    Glucose, Bld 109 (*)    BUN 23 (*)    Total Protein 6.4 (*)    Albumin 3.3 (*)    ALT 16 (*)    All other components within normal limits  CBC WITH DIFFERENTIAL/PLATELET - Abnormal; Notable for the following:    RBC 3.29 (*)    Hemoglobin 11.2 (*)    HCT 32.5 (*)    All other components within normal limits  URINALYSIS, ROUTINE W REFLEX MICROSCOPIC  AMMONIA    EKG  EKG Interpretation None       Radiology Dg Chest 2 View  Result Date: 12/08/2016 CLINICAL DATA:  Hypotensive, confused EXAM: CHEST  2 VIEW COMPARISON:  08/18/2016 FINDINGS: There is chronic elevation of the left diaphragm. There is bilateral diffuse mild interstitial thickening. There is no focal parenchymal opacity. There is no pleural effusion or pneumothorax. There is stable cardiomegaly. There is a dual lead cardiac pacemaker. The osseous structures are unremarkable. IMPRESSION: Cardiomegaly with pulmonary vascular congestion. Electronically Signed   By: Kathreen Devoid   On: 12/08/2016 16:57    Procedures Procedures (including critical care time)  Medications Ordered in ED Medications - No data to display   Initial Impression / Assessment and Plan / ED Course  I have reviewed the triage vital signs and the nursing notes.  Pertinent labs & imaging results that were available during my care of the patient were reviewed by me and considered in my medical decision making (see  chart for details).  Clinical Course as of Dec 10 154  Mon Dec 08, 2016  1931 Wife is  here now and states patient is at baseline, and she can manage him there. She states they are trying to get patient in a nursing home. Will contact Hospice to get their assistance for placement, from the home.  [EW]    Clinical Course User Index [EW] Daleen Bo, MD    Medications - No data to display  Patient Vitals for the past 24 hrs:  BP Pulse Resp SpO2 Height Weight  12/08/16 2005 122/78 60 (!) 22 95 % - -  12/08/16 1451 - - - - 6\' 2"  (1.88 m) 160 lb (72.6 kg)    6:04 PM Reevaluation with update and discussion. After initial assessment and treatment, an updated evaluation reveals wife is here now, he states he is at his baseline, and she can handle him currently, findings discussed with her and all questions were answered. Kellyjo Edgren L    Final Clinical Impressions(s) / ED Diagnoses   Final diagnoses:  Confusion    Nonspecific contusion, apparently resolved spontaneously.  Patient is at his baseline now, without evidence for serious bacterial infection metabolic instability or impending vascular collapse.  Nursing Notes Reviewed/ Care Coordinated Applicable Imaging Reviewed Interpretation of Laboratory Data incorporated into ED treatment  The patient appears reasonably screened and/or stabilized for discharge and I doubt any other medical condition or other [facility] requiring further screening, evaluation, or treatment in the ED at this time prior to discharge.  Plan: Home Medications-continue usual; Home Treatments-rest, fluids; return here if the recommended treatment, does not improve the symptoms; Recommended follow up-PCP, as needed   New Prescriptions Discharge Medication List as of 12/08/2016  7:35 PM       Daleen Bo, MD 12/09/16 0157

## 2016-12-08 NOTE — ED Notes (Signed)
Patient attempted a urine sample at this time.

## 2016-12-15 ENCOUNTER — Emergency Department (HOSPITAL_COMMUNITY)

## 2016-12-15 ENCOUNTER — Emergency Department (HOSPITAL_COMMUNITY)
Admission: EM | Admit: 2016-12-15 | Discharge: 2016-12-15 | Disposition: A | Discharge status: Home / Self Care | Attending: Emergency Medicine | Admitting: Emergency Medicine

## 2016-12-15 ENCOUNTER — Encounter (HOSPITAL_COMMUNITY): Payer: Self-pay | Admitting: Emergency Medicine

## 2016-12-15 DIAGNOSIS — Z7982 Long term (current) use of aspirin: Secondary | ICD-10-CM | POA: Diagnosis not present

## 2016-12-15 DIAGNOSIS — S0990XA Unspecified injury of head, initial encounter: Secondary | ICD-10-CM | POA: Diagnosis present

## 2016-12-15 DIAGNOSIS — Y939 Activity, unspecified: Secondary | ICD-10-CM | POA: Diagnosis not present

## 2016-12-15 DIAGNOSIS — Z85828 Personal history of other malignant neoplasm of skin: Secondary | ICD-10-CM | POA: Insufficient documentation

## 2016-12-15 DIAGNOSIS — E039 Hypothyroidism, unspecified: Secondary | ICD-10-CM | POA: Insufficient documentation

## 2016-12-15 DIAGNOSIS — I5022 Chronic systolic (congestive) heart failure: Secondary | ICD-10-CM | POA: Insufficient documentation

## 2016-12-15 DIAGNOSIS — W19XXXA Unspecified fall, initial encounter: Secondary | ICD-10-CM | POA: Diagnosis not present

## 2016-12-15 DIAGNOSIS — S0101XA Laceration without foreign body of scalp, initial encounter: Secondary | ICD-10-CM | POA: Insufficient documentation

## 2016-12-15 DIAGNOSIS — F0390 Unspecified dementia without behavioral disturbance: Secondary | ICD-10-CM | POA: Insufficient documentation

## 2016-12-15 DIAGNOSIS — Y92129 Unspecified place in nursing home as the place of occurrence of the external cause: Secondary | ICD-10-CM | POA: Diagnosis not present

## 2016-12-15 DIAGNOSIS — I2510 Atherosclerotic heart disease of native coronary artery without angina pectoris: Secondary | ICD-10-CM | POA: Diagnosis not present

## 2016-12-15 DIAGNOSIS — Y999 Unspecified external cause status: Secondary | ICD-10-CM | POA: Insufficient documentation

## 2016-12-15 DIAGNOSIS — Z87891 Personal history of nicotine dependence: Secondary | ICD-10-CM | POA: Diagnosis not present

## 2016-12-15 MED ORDER — HYDROGEN PEROXIDE 3 % EX SOLN
CUTANEOUS | Status: AC
  Filled 2016-12-15: qty 473

## 2016-12-15 MED ORDER — LORAZEPAM 2 MG/ML IJ SOLN
1.0000 mg | Freq: Once | INTRAMUSCULAR | Status: AC
  Administered 2016-12-15: 1 mg via INTRAMUSCULAR
  Filled 2016-12-15: qty 1

## 2016-12-15 MED ORDER — LIDOCAINE HCL (PF) 1 % IJ SOLN
INTRAMUSCULAR | Status: DC
Start: 2016-12-15 — End: 2016-12-15
  Filled 2016-12-15: qty 5

## 2016-12-15 MED ORDER — LEVOFLOXACIN 750 MG PO TABS: 750.0000 mg | ORAL_TABLET | Freq: Every day | ORAL | 0 refills | Status: DC

## 2016-12-15 NOTE — ED Provider Notes (Signed)
[facility] DEPT Provider Note   CSN: [id number] Arrival date & time: 12/15/16  0051     History   Chief Complaint Chief Complaint  Patient presents with  . Fall  . Head Laceration    HPI Andre Holder is a 83 y.o. male.  The history is provided by the EMS personnel and the nursing home. The history is limited by the condition of the patient (Hx dementia).  Fall   Head Laceration   Pt was seen at [facility]. Per EMS and [facility] report: Pt at [facility] for respite care, found on the floor of his room, with a laceration to the top of his head. Hospice House sent pt to ED for repair of laceration.  EMS states pt is HOH, uses voice synthesizer to talk, hx dementia/Alzheimer's. Hospice House gave pt IM ativan and haldol before transport to the ED due to agitation.   Past Medical History:  Diagnosis Date  . Adrenal hyperplasia ([place])    Stable on serial imaging  . Anemia    minimal in 2011 with hemoglobin of 12.2 and high normal MCV  . Arteriosclerotic cardiovascular disease (ASCVD)    Nonobstructive; 09/2008 50% proximal and 40% mid LAD; 25% circumflex; 30% RCA; mild global LV dysfunction with EF of 45%. No aortic stenosis.  . Borderline hypertension    Normal CMet in 2011  . Cancer of larynx ([place])    laryngectomy in 1988; postoperative radiation therapy  . Carotid stenosis   . 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 Degenerative joint disease    s/p bilateral TKR  . GERD (gastroesophageal reflux disease)   . Hyperlipidemia    Lipid profile in 04/2010:115, 98, 43, 52.  Marland Kitchen Hypothyroidism   . Mild aortic stenosis    not documented at catheterization; verified by echo in 2011  . Mitral regurgitation   . Mobitz (type) II atrioventricular block    With bradycardia; Medtronic pacemaker implanted in 09/2008  . Peripheral vascular disease ([place])    With a 70% innominate artery stenosis and nonobstructive carotid stenosis  . Skin  cancer   . Small bowel obstruction   . Tobacco abuse, in remission    Remote  . Weight loss    50 pounds between 1991 and 2011    Patient Active Problem List   Diagnosis Date Noted  . Shortness of breath 01/18/2016  . Edema 01/18/2016  . Protein calorie malnutrition ([place]) 01/18/2016  . Acute on chronic systolic congestive heart failure ([place])   . Edema extremities   . Partial small bowel obstruction 11/05/2015  . Anemia 11/05/2015  . Malnutrition of moderate degree ([place]) 01/11/2015  . Small bowel obstruction 12/23/2014  . Chronic systolic congestive heart failure, NYHA class 2 ([place]) 12/23/2014  . SBO (small bowel obstruction) ([place]) 12/23/2014  . CHF exacerbation ([place]) 08/31/2014  . Acute on chronic systolic CHF (congestive heart failure) ([place]) 08/31/2014  . Chest pain 08/31/2014  . Dyspnea 08/31/2014  . CHF (congestive heart failure) ([place]) 08/25/2013  . Syncope 10/09/2011  . Hyponatremia 10/09/2011  . GERD 06/17/2010  . WEIGHT LOSS, ABNORMAL 06/17/2010  . ANEMIA 04/08/2010  . Mobitz type II atrioventricular block 04/08/2010  . Hyperlipidemia 10/24/2009  . PACEMAKER, PERMANENT 09/20/2009  . Malignant neoplasm of larynx ([place]) 03/07/2009  . Aortic valve disorder 03/07/2009  . CEREBROVASCULAR DISEASE 03/07/2009  . PERIPHERAL VASCULAR DISEASE 03/07/2009  . Tobacco abuse, in remission 03/07/2009  . Hypothyroidism 12/27/2008  .  OSTEOARTHRITIS 12/27/2008    Past Surgical History:  Procedure Laterality Date  . APPENDECTOMY  1973  . CATARACT EXTRACTION, BILATERAL    . DECOMPRESSION FACIAL NERVE     Right median  . INSERT / REPLACE / REMOVE PACEMAKER    . KNEE ARTHROSCOPY     Left  . LARYNGECTOMY  1988   S/P laryngectomy and radiation therapy  . PACEMAKER INSERTION    . TOTAL KNEE ARTHROPLASTY     Bilateral, 19 years ago       Home Medications    Prior to Admission medications   Medication Sig Start Date End Date Taking? Authorizing Provider  Ascorbic Acid  (VITAMIN C PO) Take 1 tablet by mouth daily.    Historical Provider, MD  aspirin EC 81 MG tablet Take 81 mg by mouth daily.    Historical Provider, MD  atorvastatin (LIPITOR) 10 MG tablet Take 10 mg by mouth at bedtime.     Historical Provider, MD  cephALEXin (KEFLEX) 500 MG capsule Take 1 capsule (500 mg total) by mouth 4 (four) times daily. 09/15/16   Evalee Jefferson, PA-C  Cholecalciferol (VITAMIN D PO) Take 1 tablet by mouth daily.    Historical Provider, MD  Coenzyme Q10 (CO Q 10 PO) Take 1 tablet by mouth daily.     Historical Provider, MD  Cyanocobalamin (VITAMIN B-12 PO) Take 1 tablet by mouth daily.    Historical Provider, MD  diclofenac sodium (VOLTAREN) 1 % GEL Apply 2 g topically daily as needed (pain).    Historical Provider, MD  finasteride (PROSCAR) 5 MG tablet Take 5 mg by mouth at bedtime.    Historical Provider, MD  guaifenesin (MUCUS RELIEF) 400 MG TABS tablet Take 400 mg by mouth every 4 (four) hours as needed (for mucus).    Historical Provider, MD  HYDROcodone-acetaminophen (NORCO/VICODIN) 5-325 MG tablet Take 2 tablets by mouth every 4 (four) hours as needed. Patient taking differently: Take 2 tablets by mouth every 4 (four) hours as needed for moderate pain.  12/24/15   [name], MD  levothyroxine (SYNTHROID, LEVOTHROID) 200 MCG tablet Take 200 mcg by mouth daily before breakfast.    Historical Provider, MD  magnesium oxide (MAG-OX) 400 MG tablet Take 400 mg by mouth 2 (two) times daily.    Historical Provider, MD  Melatonin 10 MG CAPS Take 1 capsule by mouth at bedtime.     Historical Provider, MD  pantoprazole (PROTONIX) 40 MG tablet Take 40 mg by mouth 2 (two) times daily.    Historical Provider, MD  potassium chloride (K-DUR) 10 MEQ tablet Take 1 tablet (10 mEq total) by mouth 2 (two) times daily. Patient taking differently: Take 10 mEq by mouth daily.  01/23/16   Asencion Noble, MD  Resveratrol 250 MG CAPS Take 1 capsule by mouth 2 (two) times daily.    Historical Provider, MD    ropinirole (REQUIP) 5 MG tablet Take 5 mg by mouth at bedtime.    Historical Provider, MD  Tamsulosin HCl (FLOMAX) 0.4 MG CAPS Take 0.4 mg by mouth 2 (two) times daily.     Historical Provider, MD  torsemide (DEMADEX) 20 MG tablet Take 1 tablet (20 mg total) by mouth daily. 08/21/16   Asencion Noble, MD  triamcinolone (KENALOG) 0.025 % ointment Apply 1 application topically daily as needed. Skin irritation 07/24/16   Historical Provider, MD    Family History Family History  Problem Relation Age of Onset  . Stroke Mother   . Leukemia Father   .  Stroke Other   . Diabetes Other   . Colon cancer Neg Hx   . Liver disease Neg Hx   . GI problems Neg Hx     Social History Social History  Substance Use Topics  . Smoking status: Former Smoker    Types: Cigarettes  . Smokeless tobacco: Former Systems developer    Quit date: 09/08/1982     Comment: Quit 30 years  . Alcohol use No     Allergies   Patient has no known allergies.   Review of Systems Review of Systems  Unable to perform ROS: Dementia     Physical Exam Updated Vital Signs BP 110/63 (BP Location: Left Arm)   Pulse 93   Temp 97.9 F (36.6 C) (Oral)   Resp 18   Ht 6\' 2"  (1.88 m)   Wt 160 lb (72.6 kg)   SpO2 96%   BMI 20.54 kg/m     BP 124/68 (BP Location: Left Arm)   Pulse (!) 102   Temp 97.9 F (36.6 C) (Oral)   Resp 20   Ht 6\' 2"  (1.88 m)   Wt 160 lb (72.6 kg)   SpO2 97%   BMI 20.54 kg/m   (VS taken just after lac repair)    Physical Exam 0125: Physical examination:  Nursing notes reviewed; Vital signs and O2 SAT reviewed;  Constitutional: Well developed, Well nourished, Well hydrated, In no acute distress; Head:  Normocephalic, +"V" shaped flap lac to top of head.;; Eyes: EOMI, PERRL, No scleral icterus; ENMT: Mouth and pharynx normal, Mucous membranes moist; Neck: Supple, Full range of motion, No lymphadenopathy. Stoma without drainage.; Cardiovascular: Regular rate and rhythm, No gallop; Respiratory: Breath sounds  clear & equal bilaterally, No wheezes. Normal respiratory effort/excursion; Chest: Nontender, Movement normal; Abdomen: Soft, Nontender, Nondistended, Normal bowel sounds; Genitourinary: No CVA tenderness; Spine:  No midline CS, TS, LS tenderness.;; Extremities: Pulses normal, No deformity.  No edema, No calf edema or asymmetry.; Neuro: Awake, alert. No facial droop. Does not have voice synthesizer with him. Moves all extremities spontaneously on stretcher without apparent gross focal motor deficits.; Skin: Color normal, Warm, Dry.   ED Treatments / Results  Labs (all labs ordered are listed, but only abnormal results are displayed)   EKG  EKG Interpretation None       Radiology   Procedures Procedures (including critical care time)   LACERATION REPAIR Performed by: Alfonzo Feller Authorized by: Alfonzo Feller Consent: Verbal consent obtained. Risks and benefits: risks, benefits and alternatives were discussed Consent given by: patient Patient identity confirmed: provided demographic data Prepped and Draped in normal sterile fashion Wound explored Laceration Location: vertex scalp Laceration Length: irregularly shaped "V" flap laceration approx 5cm total No Foreign Bodies seen or palpated Anesthesia: none Irrigation method: syringe, NS Amount of cleaning: standard Skin closure: staples Number of sutures: 6 Technique: staples Patient tolerance: Patient tolerated the procedure well with no immediate complications.      Medications Ordered in ED Medications  LORazepam (ATIVAN) injection 1 mg (not administered)     Initial Impression / Assessment and Plan / ED Course  I have reviewed the triage vital signs and the nursing notes.  Pertinent labs & imaging results that were available during my care of the patient were reviewed by me and considered in my medical decision making (see chart for details).  MDM Reviewed: previous chart, nursing note and  vitals Interpretation: CT scan and x-ray    Dg Chest 1 View Result Date: 12/15/2016 CLINICAL  DATA:  Status post fall. Found on floor. Concern for chest injury. Initial encounter. EXAM: CHEST 1 VIEW COMPARISON:  Chest radiograph performed 12/08/2016 FINDINGS: The lungs are well-aerated. Mild bibasilar opacities may reflect atelectasis or mild infection. There is chronic elevation of the left hemidiaphragm. There is no evidence of pleural effusion or pneumothorax. The cardiomediastinal silhouette is mildly enlarged. A pacemaker is noted overlying the left chest wall, with leads ending overlying the right atrium and right ventricle. No acute osseous abnormalities are seen. IMPRESSION: Mild bibasilar opacities may reflect atelectasis or mild infection. Chronic elevation of the left hemidiaphragm. Mild cardiomegaly. Electronically Signed   By: Garald Balding M.D.   On: 12/15/2016 02:44    Ct Head Wo Contrast Result Date: 12/15/2016 CLINICAL DATA:  83 y/o M; found on floor with laceration to top of head. EXAM: CT HEAD WITHOUT CONTRAST CT CERVICAL SPINE WITHOUT CONTRAST TECHNIQUE: Multidetector CT imaging of the head and cervical spine was performed following the standard protocol without intravenous contrast. Multiplanar CT image reconstructions of the cervical spine were also generated. COMPARISON:  05/15/2016 CT cervical spine.  09/02/2014 CT head FINDINGS: CT HEAD FINDINGS Brain: Small chronic right posterior frontal cortical infarction. Advanced chronic microvascular ischemic changes and parenchymal volume loss of the brain. Numerous stable scattered calcifications in sulci throughout the supratentorial brain probably representing sequelae of prior infectious or inflammatory process. No evidence of large acute infarct, focal mass effect, intracranial hemorrhage, or hydrocephalus. Vascular: Extensive calcific atherosclerosis of cavernous and paraclinoid internal carotid arteries. Skull: Scalp laceration at the  vertex. No displaced calvarial fracture. Sinuses/Orbits: Moderate diffuse paranasal sinus mucosal thickening. Normally aerated mastoid air cells. Bilateral intra-ocular lens replacements. Other: None. CT CERVICAL SPINE FINDINGS Alignment: Stable straightening of cervical lordosis. Moderate cervical levocurvature. Skull base and vertebrae: No acute fracture. No primary bone lesion or focal pathologic process. Soft tissues and spinal canal: No prevertebral fluid or swelling. No visible canal hematoma. Disc levels: Advanced cervical spondylosis with severe disc and facet degenerative changes stable from prior CT of the cervical spine. C2-3 large central disc protrusion with anterior cord impingement and multifactorial moderate C4-5 canal stenosis. Upper chest: Negative. Other: Extensive calcific atherosclerosis of the carotid bifurcations and common carotid arteries. IMPRESSION: 1. No acute intracranial abnormality identified. 2. Scalp laceration at the vertex.  No displaced calvarial fracture. 3. Stable advanced chronic microvascular ischemic changes and parenchymal volume loss of the brain. 4. No acute fracture or dislocation of the cervical spine. 5. Stable advanced cervical spondylosis. Stable C3-4 central disc protrusion with anterior cord impingement and moderate multifactorial C4-5 canal stenosis. Electronically Signed   By: Kristine Garbe M.D.   On: 12/15/2016 03:05   Ct Cervical Spine Wo Contrast Result Date: 12/15/2016 CLINICAL DATA:  83 y/o M; found on floor with laceration to top of head. EXAM: CT HEAD WITHOUT CONTRAST CT CERVICAL SPINE WITHOUT CONTRAST TECHNIQUE: Multidetector CT imaging of the head and cervical spine was performed following the standard protocol without intravenous contrast. Multiplanar CT image reconstructions of the cervical spine were also generated. COMPARISON:  05/15/2016 CT cervical spine.  09/02/2014 CT head FINDINGS: CT HEAD FINDINGS Brain: Small chronic right  posterior frontal cortical infarction. Advanced chronic microvascular ischemic changes and parenchymal volume loss of the brain. Numerous stable scattered calcifications in sulci throughout the supratentorial brain probably representing sequelae of prior infectious or inflammatory process. No evidence of large acute infarct, focal mass effect, intracranial hemorrhage, or hydrocephalus. Vascular: Extensive calcific atherosclerosis of cavernous and paraclinoid internal carotid arteries.  Skull: Scalp laceration at the vertex. No displaced calvarial fracture. Sinuses/Orbits: Moderate diffuse paranasal sinus mucosal thickening. Normally aerated mastoid air cells. Bilateral intra-ocular lens replacements. Other: None. CT CERVICAL SPINE FINDINGS Alignment: Stable straightening of cervical lordosis. Moderate cervical levocurvature. Skull base and vertebrae: No acute fracture. No primary bone lesion or focal pathologic process. Soft tissues and spinal canal: No prevertebral fluid or swelling. No visible canal hematoma. Disc levels: Advanced cervical spondylosis with severe disc and facet degenerative changes stable from prior CT of the cervical spine. C2-3 large central disc protrusion with anterior cord impingement and multifactorial moderate C4-5 canal stenosis. Upper chest: Negative. Other: Extensive calcific atherosclerosis of the carotid bifurcations and common carotid arteries. IMPRESSION: 1. No acute intracranial abnormality identified. 2. Scalp laceration at the vertex.  No displaced calvarial fracture. 3. Stable advanced chronic microvascular ischemic changes and parenchymal volume loss of the brain. 4. No acute fracture or dislocation of the cervical spine. 5. Stable advanced cervical spondylosis. Stable C3-4 central disc protrusion with anterior cord impingement and moderate multifactorial C4-5 canal stenosis. Electronically Signed   By: Kristine Garbe M.D.   On: 12/15/2016 03:05   Dg Hips Bilat W Or  Wo Pelvis 3-4 Views Result Date: 12/15/2016 CLINICAL DATA:  Found on floor. Concern for bilateral hip injury. Initial encounter. EXAM: DG HIP (WITH OR WITHOUT PELVIS) 3-4V BILAT COMPARISON:  Bilateral hip radiographs performed 04/09/2015 FINDINGS: There is no evidence of fracture or dislocation. Both femoral heads are seated normally within their respective acetabula. The proximal femurs appear intact. Mild degenerative change is noted at the lower lumbar spine. The sacroiliac joints are unremarkable in appearance. The visualized bowel gas pattern is grossly unremarkable in appearance. Scattered vascular calcifications are seen. IMPRESSION: 1. No evidence of fracture or dislocation. 2. Scattered vascular calcifications seen. Electronically Signed   By: Garald Balding M.D.   On: 12/15/2016 02:42    0350:  IM ativan given before CT/XR due to increasing agitation with good effect. Pt calmer, will nod head to questions. VS remain stable. Lac closed with staples with good hemostasis. CT scans reassuring. CXR with atelectasis vs mild infection compared to CXR last week; pt remains afebrile, with Sats 97% R/A, no resp distress while in the ED. Labs 1 week ago unremarkable. CURB-65 low-mod risk: will tx with abx as outpatient with close PMD f/u in the next 24 to 48 hours. Will d/c back to [facility], stable.      Final Clinical Impressions(s) / ED Diagnoses   Final diagnoses:  None    New Prescriptions New Prescriptions   No medications on file      Francine Graven, DO 12/19/16 1306

## 2016-12-15 NOTE — ED Notes (Signed)
Gave report to nurse at [facility] [place] to transport patient back to facility, no distress noted, vital signs stable.

## 2016-12-15 NOTE — Discharge Instructions (Signed)
Take the prescription as directed.  Gently wash the area with soap and water at least once or twice a day; especially if the area becomes soiled. Call your regular medical doctor this morning to schedule a follow up appointment for a recheck within the next 24 to 48 hours. Staples will need to be removed in 7 to 10 days. Return to the Emergency Department immediately if worsening.

## 2016-12-15 NOTE — ED Triage Notes (Signed)
Patient was given 2 mg of Ativan and 2 mg of Haldol, before being transported to ED.

## 2016-12-15 NOTE — ED Triage Notes (Signed)
Per EMS, patient is at hospice for respite care, found on floor in room, laceration to top of head, patient is hard of hearing and using voice synthesizer to talk, dementia, alzheimer's.  Per family, patient is ambulatory, patient unable to walk while being transported to ED.

## 2016-12-18 ENCOUNTER — Other Ambulatory Visit (HOSPITAL_COMMUNITY)
Admission: RE | Admit: 2016-12-18 | Discharge: 2016-12-18 | Disposition: A | Discharge status: Home / Self Care | Payer: Medicare Other | Source: Other Acute Inpatient Hospital | Attending: Hematology and Oncology | Admitting: Hematology and Oncology

## 2016-12-18 DIAGNOSIS — A499 Bacterial infection, unspecified: Secondary | ICD-10-CM | POA: Diagnosis present

## 2016-12-18 DIAGNOSIS — N390 Urinary tract infection, site not specified: Secondary | ICD-10-CM | POA: Insufficient documentation

## 2016-12-18 LAB — URINALYSIS, ROUTINE W REFLEX MICROSCOPIC
GLUCOSE, UA: NEGATIVE mg/dL
Ketones, ur: 20 mg/dL — AB
NITRITE: NEGATIVE
PROTEIN: 30 mg/dL — AB
Specific Gravity, Urine: 1.025 (ref 1.005–1.030)
pH: 5 (ref 5.0–8.0)

## 2016-12-20 LAB — URINE CULTURE: CULTURE: NO GROWTH

## 2016-12-24 ENCOUNTER — Encounter (HOSPITAL_COMMUNITY): Payer: Self-pay

## 2016-12-24 ENCOUNTER — Inpatient Hospital Stay (HOSPITAL_COMMUNITY)
Admission: EM | Admit: 2016-12-24 | Discharge: 2017-01-01 | DRG: 682 | Disposition: A | Discharge status: Skilled Nursing Facility | Payer: Medicare Other | Attending: Internal Medicine | Admitting: Internal Medicine

## 2016-12-24 ENCOUNTER — Emergency Department (HOSPITAL_COMMUNITY): Payer: Medicare Other

## 2016-12-24 DIAGNOSIS — L89112 Pressure ulcer of right upper back, stage 2: Secondary | ICD-10-CM | POA: Diagnosis present

## 2016-12-24 DIAGNOSIS — Z66 Do not resuscitate: Secondary | ICD-10-CM | POA: Diagnosis present

## 2016-12-24 DIAGNOSIS — Z7982 Long term (current) use of aspirin: Secondary | ICD-10-CM

## 2016-12-24 DIAGNOSIS — E871 Hypo-osmolality and hyponatremia: Secondary | ICD-10-CM | POA: Diagnosis present

## 2016-12-24 DIAGNOSIS — Z823 Family history of stroke: Secondary | ICD-10-CM

## 2016-12-24 DIAGNOSIS — I5022 Chronic systolic (congestive) heart failure: Secondary | ICD-10-CM | POA: Diagnosis present

## 2016-12-24 DIAGNOSIS — E870 Hyperosmolality and hypernatremia: Secondary | ICD-10-CM | POA: Diagnosis present

## 2016-12-24 DIAGNOSIS — K219 Gastro-esophageal reflux disease without esophagitis: Secondary | ICD-10-CM | POA: Diagnosis present

## 2016-12-24 DIAGNOSIS — E872 Acidosis, unspecified: Secondary | ICD-10-CM

## 2016-12-24 DIAGNOSIS — L89892 Pressure ulcer of other site, stage 2: Secondary | ICD-10-CM | POA: Diagnosis present

## 2016-12-24 DIAGNOSIS — Z85828 Personal history of other malignant neoplasm of skin: Secondary | ICD-10-CM

## 2016-12-24 DIAGNOSIS — N179 Acute kidney failure, unspecified: Secondary | ICD-10-CM

## 2016-12-24 DIAGNOSIS — Z9981 Dependence on supplemental oxygen: Secondary | ICD-10-CM

## 2016-12-24 DIAGNOSIS — N390 Urinary tract infection, site not specified: Secondary | ICD-10-CM | POA: Diagnosis present

## 2016-12-24 DIAGNOSIS — I70208 Unspecified atherosclerosis of native arteries of extremities, other extremity: Secondary | ICD-10-CM | POA: Diagnosis present

## 2016-12-24 DIAGNOSIS — L89122 Pressure ulcer of left upper back, stage 2: Secondary | ICD-10-CM | POA: Diagnosis present

## 2016-12-24 DIAGNOSIS — Z87891 Personal history of nicotine dependence: Secondary | ICD-10-CM

## 2016-12-24 DIAGNOSIS — E785 Hyperlipidemia, unspecified: Secondary | ICD-10-CM | POA: Diagnosis present

## 2016-12-24 DIAGNOSIS — L89152 Pressure ulcer of sacral region, stage 2: Secondary | ICD-10-CM | POA: Diagnosis present

## 2016-12-24 DIAGNOSIS — E860 Dehydration: Secondary | ICD-10-CM | POA: Diagnosis present

## 2016-12-24 DIAGNOSIS — R4182 Altered mental status, unspecified: Secondary | ICD-10-CM | POA: Diagnosis not present

## 2016-12-24 DIAGNOSIS — D72829 Elevated white blood cell count, unspecified: Secondary | ICD-10-CM

## 2016-12-24 DIAGNOSIS — G9341 Metabolic encephalopathy: Secondary | ICD-10-CM | POA: Diagnosis present

## 2016-12-24 DIAGNOSIS — Z681 Body mass index (BMI) 19 or less, adult: Secondary | ICD-10-CM | POA: Diagnosis not present

## 2016-12-24 DIAGNOSIS — E43 Unspecified severe protein-calorie malnutrition: Secondary | ICD-10-CM | POA: Diagnosis present

## 2016-12-24 DIAGNOSIS — I2510 Atherosclerotic heart disease of native coronary artery without angina pectoris: Secondary | ICD-10-CM | POA: Diagnosis present

## 2016-12-24 DIAGNOSIS — D649 Anemia, unspecified: Secondary | ICD-10-CM | POA: Diagnosis present

## 2016-12-24 DIAGNOSIS — R1310 Dysphagia, unspecified: Secondary | ICD-10-CM | POA: Diagnosis present

## 2016-12-24 DIAGNOSIS — J189 Pneumonia, unspecified organism: Secondary | ICD-10-CM

## 2016-12-24 DIAGNOSIS — E039 Hypothyroidism, unspecified: Secondary | ICD-10-CM | POA: Diagnosis present

## 2016-12-24 DIAGNOSIS — E876 Hypokalemia: Secondary | ICD-10-CM | POA: Diagnosis present

## 2016-12-24 DIAGNOSIS — Z9181 History of falling: Secondary | ICD-10-CM

## 2016-12-24 DIAGNOSIS — L89222 Pressure ulcer of left hip, stage 2: Secondary | ICD-10-CM | POA: Diagnosis present

## 2016-12-24 DIAGNOSIS — R410 Disorientation, unspecified: Secondary | ICD-10-CM

## 2016-12-24 DIAGNOSIS — Z79899 Other long term (current) drug therapy: Secondary | ICD-10-CM

## 2016-12-24 DIAGNOSIS — Z9002 Acquired absence of larynx: Secondary | ICD-10-CM

## 2016-12-24 DIAGNOSIS — Z950 Presence of cardiac pacemaker: Secondary | ICD-10-CM

## 2016-12-24 DIAGNOSIS — L8990 Pressure ulcer of unspecified site, unspecified stage: Secondary | ICD-10-CM | POA: Diagnosis present

## 2016-12-24 DIAGNOSIS — Z8521 Personal history of malignant neoplasm of larynx: Secondary | ICD-10-CM

## 2016-12-24 DIAGNOSIS — I441 Atrioventricular block, second degree: Secondary | ICD-10-CM | POA: Diagnosis present

## 2016-12-24 DIAGNOSIS — Z833 Family history of diabetes mellitus: Secondary | ICD-10-CM

## 2016-12-24 DIAGNOSIS — L89212 Pressure ulcer of right hip, stage 2: Secondary | ICD-10-CM | POA: Diagnosis present

## 2016-12-24 DIAGNOSIS — I350 Nonrheumatic aortic (valve) stenosis: Secondary | ICD-10-CM | POA: Diagnosis present

## 2016-12-24 DIAGNOSIS — Z96653 Presence of artificial knee joint, bilateral: Secondary | ICD-10-CM | POA: Diagnosis present

## 2016-12-24 DIAGNOSIS — Z806 Family history of leukemia: Secondary | ICD-10-CM

## 2016-12-24 LAB — BLOOD GAS, VENOUS
ACID-BASE EXCESS: 0.5 mmol/L (ref 0.0–2.0)
BICARBONATE: 24.5 mmol/L (ref 20.0–28.0)
FIO2: 0.21
O2 Saturation: 88.5 %
PH VEN: 7.395 (ref 7.250–7.430)
PO2 VEN: 60.8 mmHg — AB (ref 32.0–45.0)
Patient temperature: 37
pCO2, Ven: 41.4 mmHg — ABNORMAL LOW (ref 44.0–60.0)

## 2016-12-24 LAB — COMPREHENSIVE METABOLIC PANEL
ALBUMIN: 2.9 g/dL — AB (ref 3.5–5.0)
ALK PHOS: 98 U/L (ref 38–126)
ALT: 26 U/L (ref 17–63)
ANION GAP: 11 (ref 5–15)
AST: 56 U/L — ABNORMAL HIGH (ref 15–41)
BUN: 129 mg/dL — ABNORMAL HIGH (ref 6–20)
CALCIUM: 9 mg/dL (ref 8.9–10.3)
CHLORIDE: 114 mmol/L — AB (ref 101–111)
CO2: 26 mmol/L (ref 22–32)
CREATININE: 2.55 mg/dL — AB (ref 0.61–1.24)
GFR calc Af Amer: 24 mL/min — ABNORMAL LOW (ref >60)
GFR calc non Af Amer: 20 mL/min — ABNORMAL LOW (ref >60)
GLUCOSE: 145 mg/dL — AB (ref 65–99)
Potassium: 5 mmol/L (ref 3.5–5.1)
SODIUM: 151 mmol/L — AB (ref 135–145)
TOTAL PROTEIN: 7.5 g/dL (ref 6.5–8.1)
Total Bilirubin: 0.9 mg/dL (ref 0.3–1.2)

## 2016-12-24 LAB — URINALYSIS, ROUTINE W REFLEX MICROSCOPIC
Bilirubin Urine: NEGATIVE
GLUCOSE, UA: NEGATIVE mg/dL
HGB URINE DIPSTICK: NEGATIVE
Ketones, ur: NEGATIVE mg/dL
NITRITE: NEGATIVE
PROTEIN: NEGATIVE mg/dL
Specific Gravity, Urine: 1.017 (ref 1.005–1.030)
pH: 5 (ref 5.0–8.0)

## 2016-12-24 LAB — I-STAT CG4 LACTIC ACID, ED
LACTIC ACID, VENOUS: 2.97 mmol/L — AB (ref 0.5–1.9)
Lactic Acid, Venous: 1.37 mmol/L (ref 0.5–1.9)

## 2016-12-24 LAB — CBC WITH DIFFERENTIAL/PLATELET
BASOS ABS: 0 10*3/uL (ref 0.0–0.1)
BASOS PCT: 0 %
EOS PCT: 0 %
Eosinophils Absolute: 0 10*3/uL (ref 0.0–0.7)
HCT: 41.3 % (ref 39.0–52.0)
Hemoglobin: 13.8 g/dL (ref 13.0–17.0)
Lymphocytes Relative: 7 %
Lymphs Abs: 1.4 10*3/uL (ref 0.7–4.0)
MCH: 34.1 pg — ABNORMAL HIGH (ref 26.0–34.0)
MCHC: 33.4 g/dL (ref 30.0–36.0)
MCV: 102 fL — ABNORMAL HIGH (ref 78.0–100.0)
MONO ABS: 1.4 10*3/uL — AB (ref 0.1–1.0)
Monocytes Relative: 8 %
Neutro Abs: 15.6 10*3/uL — ABNORMAL HIGH (ref 1.7–7.7)
Neutrophils Relative %: 85 %
PLATELETS: 275 10*3/uL (ref 150–400)
RBC: 4.05 MIL/uL — ABNORMAL LOW (ref 4.22–5.81)
RDW: 14 % (ref 11.5–15.5)
WBC: 18.3 10*3/uL — ABNORMAL HIGH (ref 4.0–10.5)

## 2016-12-24 LAB — AMMONIA: Ammonia: 23 umol/L (ref 9–35)

## 2016-12-24 MED ORDER — ACETAMINOPHEN 500 MG PO TABS
500.0000 mg | ORAL_TABLET | Freq: Four times a day (QID) | ORAL | Status: DC | PRN
  Administered 2016-12-26 – 2017-01-01 (×4): 500 mg via ORAL
  Filled 2016-12-24 (×4): qty 1

## 2016-12-24 MED ORDER — SODIUM CHLORIDE 0.9 % IV BOLUS (SEPSIS)
1000.0000 mL | Freq: Once | INTRAVENOUS | Status: AC
  Administered 2016-12-24: 1000 mL via INTRAVENOUS

## 2016-12-24 MED ORDER — DEXTROSE 5 % IV SOLN
1.0000 g | Freq: Once | INTRAVENOUS | Status: DC
  Filled 2016-12-24: qty 10

## 2016-12-24 MED ORDER — DEXTROSE 5 % IV SOLN
INTRAVENOUS | Status: DC
  Administered 2016-12-24: 20:00:00 via INTRAVENOUS
  Administered 2016-12-25: 1000 mL via INTRAVENOUS
  Administered 2016-12-25: 03:00:00 via INTRAVENOUS
  Administered 2016-12-25: 500 mL via INTRAVENOUS
  Administered 2016-12-25 – 2016-12-28 (×5): via INTRAVENOUS

## 2016-12-24 MED ORDER — ONDANSETRON HCL 4 MG/2ML IJ SOLN: 4.0000 mg | Freq: Four times a day (QID) | INTRAMUSCULAR | Status: DC | PRN

## 2016-12-24 MED ORDER — ORAL CARE MOUTH RINSE
15.0000 mL | Freq: Two times a day (BID) | OROMUCOSAL | Status: DC
  Administered 2016-12-25 – 2017-01-01 (×14): 15 mL via OROMUCOSAL

## 2016-12-24 MED ORDER — DEXTROSE 5 % IV SOLN
1.0000 g | INTRAVENOUS | Status: DC
  Administered 2016-12-25 – 2016-12-31 (×7): 1 g via INTRAVENOUS
  Filled 2016-12-24 (×8): qty 10

## 2016-12-24 MED ORDER — FINASTERIDE 5 MG PO TABS
ORAL_TABLET | ORAL | Status: AC
  Filled 2016-12-24: qty 1

## 2016-12-24 MED ORDER — ONDANSETRON HCL 4 MG PO TABS: 4.0000 mg | ORAL_TABLET | Freq: Four times a day (QID) | ORAL | Status: DC | PRN

## 2016-12-24 MED ORDER — HEPARIN SODIUM (PORCINE) 5000 UNIT/ML IJ SOLN
5000.0000 [IU] | Freq: Three times a day (TID) | INTRAMUSCULAR | Status: DC
  Administered 2016-12-24 – 2017-01-01 (×23): 5000 [IU] via SUBCUTANEOUS
  Filled 2016-12-24 (×22): qty 1

## 2016-12-24 MED ORDER — FLUCONAZOLE IN SODIUM CHLORIDE 100-0.9 MG/50ML-% IV SOLN
100.0000 mg | INTRAVENOUS | Status: AC
  Administered 2016-12-25 – 2016-12-27 (×3): 100 mg via INTRAVENOUS
  Filled 2016-12-24 (×4): qty 50

## 2016-12-24 MED ORDER — LEVOTHYROXINE SODIUM 100 MCG PO TABS
200.0000 ug | ORAL_TABLET | Freq: Every day | ORAL | Status: DC
  Administered 2016-12-25 – 2017-01-01 (×8): 200 ug via ORAL
  Filled 2016-12-24 (×8): qty 2

## 2016-12-24 MED ORDER — PANTOPRAZOLE SODIUM 40 MG PO TBEC
40.0000 mg | DELAYED_RELEASE_TABLET | Freq: Every day | ORAL | Status: DC
  Administered 2016-12-24 – 2017-01-01 (×9): 40 mg via ORAL
  Filled 2016-12-24 (×9): qty 1

## 2016-12-24 MED ORDER — MORPHINE SULFATE (PF) 2 MG/ML IV SOLN
1.0000 mg | INTRAVENOUS | Status: AC | PRN
  Administered 2016-12-24 – 2016-12-25 (×2): 1 mg via INTRAVENOUS
  Filled 2016-12-24 (×2): qty 1

## 2016-12-24 MED ORDER — DEXTROSE 5 % IV SOLN
1.0000 g | INTRAVENOUS | Status: DC
  Filled 2016-12-24: qty 10

## 2016-12-24 MED ORDER — FLUCONAZOLE 100MG IVPB
100.0000 mg | INTRAVENOUS | Status: DC
  Administered 2016-12-24: 100 mg via INTRAVENOUS
  Filled 2016-12-24: qty 50

## 2016-12-24 MED ORDER — ASPIRIN EC 81 MG PO TBEC
81.0000 mg | DELAYED_RELEASE_TABLET | Freq: Every day | ORAL | Status: DC
  Administered 2016-12-24 – 2017-01-01 (×9): 81 mg via ORAL
  Filled 2016-12-24 (×9): qty 1

## 2016-12-24 MED ORDER — FAMOTIDINE IN NACL 20-0.9 MG/50ML-% IV SOLN
20.0000 mg | INTRAVENOUS | Status: DC
  Administered 2016-12-24 – 2016-12-26 (×3): 20 mg via INTRAVENOUS
  Filled 2016-12-24 (×3): qty 50

## 2016-12-24 MED ORDER — FINASTERIDE 5 MG PO TABS
5.0000 mg | ORAL_TABLET | Freq: Every day | ORAL | Status: DC
  Administered 2016-12-24 – 2016-12-31 (×7): 5 mg via ORAL
  Filled 2016-12-24 (×9): qty 1

## 2016-12-24 MED ORDER — FLUCONAZOLE IN SODIUM CHLORIDE 100-0.9 MG/50ML-% IV SOLN
100.0000 mg | INTRAVENOUS | Status: DC
  Filled 2016-12-24: qty 50

## 2016-12-24 MED ORDER — CHLORHEXIDINE GLUCONATE 0.12 % MT SOLN
15.0000 mL | Freq: Two times a day (BID) | OROMUCOSAL | Status: DC
  Administered 2016-12-24 – 2017-01-01 (×15): 15 mL via OROMUCOSAL
  Filled 2016-12-24 (×12): qty 15

## 2016-12-24 MED ORDER — TAMSULOSIN HCL 0.4 MG PO CAPS
0.4000 mg | ORAL_CAPSULE | Freq: Two times a day (BID) | ORAL | Status: DC
  Administered 2016-12-24 – 2017-01-01 (×16): 0.4 mg via ORAL
  Filled 2016-12-24 (×16): qty 1

## 2016-12-24 MED ORDER — GUAIFENESIN ER 600 MG PO TB12: 1200.0000 mg | ORAL_TABLET | Freq: Two times a day (BID) | ORAL | Status: DC | PRN

## 2016-12-24 NOTE — H&P (Signed)
History and Physical    [name] Holder:[id number] DOB: 23-Aug-1923 DOA: 12/24/2016  PCP: Asencion Noble, MD   Patient coming from: Home.  I have personally briefly reviewed patient's old medical records in [place]  Chief Complaint: Altered mental status.  HPI: Andre Holder is a 83 y.o. male with medical history significant of adrenal hyperplasia, anemia, Nonocclusive CAD, mild aortic stenosis, history of Mobitz type II AV block, history of pacemaker placement, peripheral vascular disease, borderline hypertension, hyperlipidemia, carotid stenosis, DJD, GERD, hypothyroidism who was brought to the emergency department for progressively worse altered mental status in the past 9 days, following a fall. The patient is unable to provide history at this time.  Per patient's daughter, the patient normally is able to communicate freely, is fully oriented,  uses a motorized scooter, goes shopping with her management is usually aware of his surroundings. His wife usually helps him a home, but given her diabetes and other health issues she has been unable to help him with daily care and activities lately. Patient was taken to a hospice house where he had a fall, was seen in the ER on 12/15/2016. Per records, prior to arrival to the ED that date, the patient received Haldol and Ativan at the hospice house. He also has been getting scopolamine. Since then, the patient's mental status has been progressively declining, with decrease oral intake and worsening physical status. He has developed decubiti ulcers.  ED Course: The patient received 2000 and ML openness bolus, 1 g of Rocephin and 100 mg of Diflucan IVPB. His urine analysis showed pyuria 06-30 WBC per hpf. WBC was 18.3, hemoglobin 13.8 g/dL and platelets 278. Sodium level CLI, potassium 5.0, chloride 114, bicarbonate 26 mmol/L. His lactic acid initially was 2.97, but after IV fluids and antibiotics is 1.37 mmol/L.  Imaging: Chest radiograph and CT  of the head did not show any acute abnormalities.  Review of Systems: As per HPI otherwise 10 point review of systems negative.    Past Medical History:  Diagnosis Date  . Adrenal hyperplasia ([place])    Stable on serial imaging  . Anemia    minimal in 2011 with hemoglobin of 12.2 and high normal MCV  . Arteriosclerotic cardiovascular disease (ASCVD)    Nonobstructive; 09/2008 50% proximal and 40% mid LAD; 25% circumflex; 30% RCA; mild global LV dysfunction with EF of 45%. No aortic stenosis.  . Borderline hypertension    Normal CMet in 2011  . Cancer of larynx ([place])    laryngectomy in 1988; postoperative radiation therapy  . Carotid stenosis   . 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 Degenerative joint disease    s/p bilateral TKR  . GERD (gastroesophageal reflux disease)   . Hyperlipidemia    Lipid profile in 04/2010:115, 98, 43, 52.  Marland Kitchen Hypothyroidism   . Mild aortic stenosis    not documented at catheterization; verified by echo in 2011  . Mitral regurgitation   . Mobitz (type) II atrioventricular block    With bradycardia; Medtronic pacemaker implanted in 09/2008  . Peripheral vascular disease ([place])    With a 70% innominate artery stenosis and nonobstructive carotid stenosis  . Skin cancer   . Small bowel obstruction ([place])   . Tobacco abuse, in remission    Remote  . Weight loss    50 pounds between 1991 and 2011    Past Surgical History:  Procedure Laterality Date  . APPENDECTOMY  1973  . CATARACT EXTRACTION, BILATERAL    . DECOMPRESSION FACIAL NERVE     Right median  . INSERT / REPLACE / REMOVE PACEMAKER    . KNEE ARTHROSCOPY     Left  . LARYNGECTOMY  1988   S/P laryngectomy and radiation therapy  . PACEMAKER INSERTION    . TOTAL KNEE ARTHROPLASTY     Bilateral, 19 years ago     reports that he has quit smoking. His smoking use included Cigarettes. He quit smokeless tobacco use about 34 years ago. He reports that he  does not drink alcohol or use drugs.  No Known Allergies  Family History  Problem Relation Age of Onset  . Stroke Mother   . Leukemia Father   . Stroke Other   . Diabetes Other   . Colon cancer Neg Hx   . Liver disease Neg Hx   . GI problems Neg Hx     Prior to Admission medications   Medication Sig Start Date End Date Taking? Authorizing Provider  acetaminophen (TYLENOL) 500 MG tablet Take 500 mg by mouth every 4 (four) hours as needed.   Yes Historical Provider, MD  aspirin EC 81 MG tablet Take 81 mg by mouth daily.   Yes Historical Provider, MD  diclofenac sodium (VOLTAREN) 1 % GEL Apply 2 g topically daily as needed (pain).   Yes Historical Provider, MD  finasteride (PROSCAR) 5 MG tablet Take 5 mg by mouth at bedtime.   Yes Historical Provider, MD  fluconazole (DIFLUCAN) 200 MG tablet Take 200 mg by mouth daily.   Yes Historical Provider, MD  guaifenesin (MUCUS RELIEF) 400 MG TABS tablet Take 400 mg by mouth every 4 (four) hours as needed (for mucus).   Yes Historical Provider, MD  haloperidol (HALDOL) 10 MG tablet Take 10 mg by mouth 2 (two) times daily.   Yes Historical Provider, MD  haloperidol (HALDOL) 2 MG/ML solution Take 10 mg by mouth 2 (two) times daily. Take 25ml every morning, 2 and 1/2 ml at lunch, and 20ml at bedtime   Yes Historical Provider, MD  levothyroxine (SYNTHROID, LEVOTHROID) 200 MCG tablet Take 200 mcg by mouth daily before breakfast.   Yes Historical Provider, MD  LORazepam (ATIVAN) 2 MG/ML concentrated solution Take 2 mg by mouth every 2 (two) hours as needed for anxiety.   Yes Historical Provider, MD  Multiple Vitamin (MULTIVITAMIN) tablet Take 1 tablet by mouth daily.   Yes Historical Provider, MD  neomycin-bacitracin-polymyxin (NEOSPORIN) ointment Apply 1 application topically every 12 (twelve) hours. apply to eye   Yes Historical Provider, MD  OXYCODONE-ACETAMINOPHEN PO Take 1 mL by mouth every 2 (two) hours. 10mg /ml Take 72ml every 2 hours as needed for  moderate pain   Yes Historical Provider, MD  OXYGEN Inhale 2 L into the lungs daily.   Yes Historical Provider, MD  pantoprazole (PROTONIX) 40 MG tablet Take 40 mg by mouth daily.    Yes Historical Provider, MD  potassium chloride (K-DUR) 10 MEQ tablet Take 1 tablet (10 mEq total) by mouth 2 (two) times daily. Patient taking differently: Take 10 mEq by mouth daily.  01/23/16  Yes Asencion Noble, MD  scopolamine (TRANSDERM-SCOP) 1 MG/3DAYS Place 1 patch onto the skin every 8 (eight) hours as needed.   Yes Historical Provider, MD  Tamsulosin HCl (FLOMAX) 0.4 MG CAPS Take 0.4 mg by mouth 2 (two) times daily.    Yes Historical Provider, MD  torsemide (DEMADEX) 20 MG tablet Take 1 tablet (20 mg  total) by mouth daily. Patient taking differently: Take 10 mg by mouth daily.  08/21/16  Yes Asencion Noble, MD  triamcinolone (KENALOG) 0.025 % ointment Apply 1 application topically daily as needed. Skin irritation 07/24/16  Yes Historical Provider, MD  cephALEXin (KEFLEX) 500 MG capsule Take 1 capsule (500 mg total) by mouth 4 (four) times daily. 09/15/16   Evalee Jefferson, PA-C  levofloxacin (LEVAQUIN) 750 MG tablet Take 1 tablet (750 mg total) by mouth daily. 12/15/16   Francine Graven, DO    Physical Exam:  Constitutional:Looks chronically ill and cachectic. Vitals:   12/24/16 1800 12/24/16 1853 12/24/16 1933 12/24/16 2000  BP: 126/70     Pulse: 79     Resp: 16     Temp:  97.7 F (36.5 C)  97.6 F (36.4 C)  TempSrc:  Axillary  Oral  SpO2: 97%  97%   Weight:  60.5 kg (133 lb 6.1 oz)    Height:  6\' 2"  (1.88 m)     Eyes: PERRL, lids and conjunctivae normal ENMT: Mucous membranes are dry. Posterior pharynx clear of any exudate or lesions.Absent dentition.  Neck: normal, supple, no masses, no thyromegaly Respiratory: Decreased breath sounds on bases, but otherwise clear to auscultation bilaterally, no wheezing, no crackles. Normal respiratory effort. No accessory muscle use.  Cardiovascular: Regular rate and  rhythm, positive SEM, no rubs / gallops. No extremity edema. 2+ pedal pulses. No carotid bruits.  Abdomen: Soft, no tenderness, no masses palpated. No hepatosplenomegaly. Bowel sounds positive.  Musculoskeletal: no clubbing / cyanosis. Good ROM, no contractures. Normal muscle tone.  Skin: Stage 2 and 3 decubiti ulcer. Neurologic: CN 2-12 grossly intact. Sensation intact, DTR normal. Strength 5/5 in all 4.  Psychiatric: Normal judgment and insight. Alert and oriented x 3. Normal mood.   Labs on Admission: I have personally reviewed following labs and imaging studies  CBC:  Recent Labs Lab 12/24/16 1453  WBC 18.3*  NEUTROABS 15.6*  HGB 13.8  HCT 41.3  MCV 102.0*  PLT 604   Basic Metabolic Panel:  Recent Labs Lab 12/24/16 1453  NA 151*  K 5.0  CL 114*  CO2 26  GLUCOSE 145*  BUN 129*  CREATININE 2.55*  CALCIUM 9.0   GFR: Estimated Creatinine Clearance: 16.1 mL/min (A) (by C-G formula based on SCr of 2.55 mg/dL (H)). Liver Function Tests:  Recent Labs Lab 12/24/16 1453  AST 56*  ALT 26  ALKPHOS 98  BILITOT 0.9  PROT 7.5  ALBUMIN 2.9*   No results for input(s): LIPASE, AMYLASE in the last 168 hours.  Recent Labs Lab 12/24/16 1453  AMMONIA 23   Coagulation Profile: No results for input(s): INR, PROTIME in the last 168 hours. Cardiac Enzymes: No results for input(s): CKTOTAL, CKMB, CKMBINDEX, TROPONINI in the last 168 hours. BNP (last 3 results) No results for input(s): PROBNP in the last 8760 hours. HbA1C: No results for input(s): HGBA1C in the last 72 hours. CBG: No results for input(s): GLUCAP in the last 168 hours. Lipid Profile: No results for input(s): CHOL, HDL, LDLCALC, TRIG, CHOLHDL, LDLDIRECT in the last 72 hours. Thyroid Function Tests: No results for input(s): TSH, T4TOTAL, FREET4, T3FREE, THYROIDAB in the last 72 hours. Anemia Panel: No results for input(s): VITAMINB12, FOLATE, FERRITIN, TIBC, IRON, RETICCTPCT in the last 72 hours. Urine  analysis:    Component Value Date/Time   COLORURINE AMBER (A) 12/24/2016 1458   APPEARANCEUR HAZY (A) 12/24/2016 1458   LABSPEC 1.017 12/24/2016 1458   PHURINE 5.0 12/24/2016  [place] 12/24/2016 [place] 12/24/2016 [place] 12/24/2016 1458   KETONESUR NEGATIVE 12/24/2016 1458   PROTEINUR NEGATIVE 12/24/2016 1458   UROBILINOGEN 0.2 02/10/2014 1720   NITRITE NEGATIVE 12/24/2016 1458   LEUKOCYTESUR SMALL (A) 12/24/2016 1458    Radiological Exams on Admission: Dg Chest 1 View  Result Date: 12/24/2016 CLINICAL DATA:  83 year old male with altered mental status for 9 days after a fall. Initial encounter. EXAM: CHEST 1 VIEW COMPARISON:  12/15/2016 and earlier. FINDINGS: AP semi upright view of the chest at 1417 hours. Chronic elevation of the left hemidiaphragm is stable since 2017. Stable cardiac size and mediastinal contours. Left chest stool lead cardiac pacemaker. Calcified aortic atherosclerosis. Visualized tracheal air column is within normal limits. Chronic left lung base hypo ventilation appears stable. No pneumothorax, pulmonary edema, pleural effusion or acute pulmonary opacity. Chronic levoconvex scoliosis at the thoracolumbar junction. No acute osseous abnormality identified. Negative visible bowel gas pattern. IMPRESSION: 1.  No acute cardiopulmonary abnormality. 2. Calcified aortic atherosclerosis. Electronically Signed   By: Genevie Ann M.D.   On: 12/24/2016 14:33   Ct Head Wo Contrast  Result Date: 12/24/2016 CLINICAL DATA:  Status post fall.  Altered mental status ever since. EXAM: CT HEAD WITHOUT CONTRAST TECHNIQUE: Contiguous axial images were obtained from the base of the skull through the vertex without intravenous contrast. COMPARISON:  12/15/2016 FINDINGS: Brain: No evidence of acute infarction, hemorrhage, extra-axial collection, ventriculomegaly, or mass effect. Punctate calcifications throughout the right cerebral hemisphere  likely reflecting sequela of prior infectious or inflammatory process. Generalized cerebral atrophy. Periventricular white matter low attenuation likely secondary to microangiopathy. Vascular: Cerebrovascular atherosclerotic calcifications are noted. Skull: Negative for fracture or focal lesion. Sinuses/Orbits: Visualized portions of the orbits are unremarkable. Visualized portions of the paranasal sinuses and mastoid air cells are unremarkable. Other: None. IMPRESSION: 1. No acute intracranial pathology. 2. Chronic microvascular disease and cerebral atrophy. Electronically Signed   By: Kathreen Devoid   On: 12/24/2016 15:08    EKG: Independently reviewed Vent. rate 89 BPM PR interval * ms QRS duration 174 ms QT/QTc 413/503 ms P-R-T axes * 82 215 Accelerated junctional rhythm LVH with secondary repolarization abnormality Prolonged QT interval  Assessment/Plan Principal Problem:   Hypernatremia Admit to stepdown/inpatient. Free water deficit is 2.4 L. D5W at 125 ML per hour. Monitor mental status. Monitor intake and output. Follow-up electrolytes, BUN and creatinine in a.m.  Active Problems:   UTI (urinary tract infection) Continue IV Rocephin and Diflucan. Follow-up urine culture and sensitivity. Follow-up blood cultures and sensitivity.    Chronic systolic congestive heart failure, NYHA class 2 (HCC) Compensated. Hold diuretics for now. Monitor intake and output.    Anemia Monitor hematocrit and hemoglobin.    Pressure injury of skin Continue local care.    Hypothyroidism Continue levothyroxine 200 g by mouth daily.    DVT prophylaxis: Lovenox SQ. Code Status: DNR/DNI Family Communication: Her daughter Celso Amy was present in the ED. Disposition Plan: Admit for IV hydration  Consults called:  Admission status: Inpatient/Stepdown.   Reubin Milan MD [facility] Pager [phone].  If 7PM-7AM, please contact night-coverage [email or web address] Password  [facility]  12/24/2016, 8:43 PM

## 2016-12-24 NOTE — ED Notes (Signed)
In xray

## 2016-12-24 NOTE — ED Notes (Addendum)
Patient placed on 4 LPM of oxygen- sats increased to 94%. Patient placed on 2 LPM.

## 2016-12-24 NOTE — ED Notes (Signed)
EKG given to Dr. Mesner. 

## 2016-12-24 NOTE — ED Provider Notes (Signed)
[facility] DEPT Provider Note   CSN: [id number] Arrival date & time: 12/24/16  1317     History   Chief Complaint Chief Complaint  Patient presents with  . Altered Mental Status  . Fall    HPI Andre Holder is a 83 y.o. male.  HPI  83 year old with a long recent history. Patient's daughter gives the history as he is not participating in history given at this time. States that the patient been started on hospice so that they can get more frequent nursing visits at home and try to avoid Him to the hospital and go to the primary care office as frequently. The hospice nurse taking care of the patient needed of break so that patient was even a convalescence stay at hospice house about 10 days ago. Prior to going to the hospice house the patient had been able to get around on a motorized scooter, communicate freely, make decisions and go shopping with the help of his daughter. While he was in the hospice house the patient developed agitation to the point where he had a fall. He came here was evaluated without significant findings and patient was discharged back to the hospitalist L3 continued to be agitated. They started him on Haldol, Ativan and scopolamine. The patient's mental status became very muted after this and has not been back to normal since being on these medications. The daughter states that he was treated for a yeast infection during this time which is also cleared up the patient's mental status is not improved. She doesn't note any particular illnesses otherwise just his overall decreased mental status and ill appearance. She states that he does have new wounds secondary to not be able get out of bed. Once again patient is not able to participate in history and daughter is not able to give a lot of review of systems help.  Past Medical History:  Diagnosis Date  . Adrenal hyperplasia ([place])    Stable on serial imaging  . Anemia    minimal in 2011 with hemoglobin of 12.2 and  high normal MCV  . Arteriosclerotic cardiovascular disease (ASCVD)    Nonobstructive; 09/2008 50% proximal and 40% mid LAD; 25% circumflex; 30% RCA; mild global LV dysfunction with EF of 45%. No aortic stenosis.  . Borderline hypertension    Normal CMet in 2011  . Cancer of larynx ([place])    laryngectomy in 1988; postoperative radiation therapy  . Carotid stenosis   . 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 Degenerative joint disease    s/p bilateral TKR  . GERD (gastroesophageal reflux disease)   . Hyperlipidemia    Lipid profile in 04/2010:115, 98, 43, 52.  Marland Kitchen Hypothyroidism   . Mild aortic stenosis    not documented at catheterization; verified by echo in 2011  . Mitral regurgitation   . Mobitz (type) II atrioventricular block    With bradycardia; Medtronic pacemaker implanted in 09/2008  . Peripheral vascular disease ([place])    With a 70% innominate artery stenosis and nonobstructive carotid stenosis  . Skin cancer   . Small bowel obstruction ([place])   . Tobacco abuse, in remission    Remote  . Weight loss    50 pounds between 1991 and 2011    Patient Active Problem List   Diagnosis Date Noted  . Shortness of breath 01/18/2016  . Edema 01/18/2016  . Protein calorie malnutrition ([place]) 01/18/2016  . Acute  on chronic systolic congestive heart failure ([place])   . Edema extremities   . Partial small bowel obstruction ([place]) 11/05/2015  . Anemia 11/05/2015  . Malnutrition of moderate degree ([place]) 01/11/2015  . Small bowel obstruction ([place]) 12/23/2014  . Chronic systolic congestive heart failure, NYHA class 2 ([place]) 12/23/2014  . SBO (small bowel obstruction) ([place]) 12/23/2014  . CHF exacerbation ([place]) 08/31/2014  . Acute on chronic systolic CHF (congestive heart failure) ([place]) 08/31/2014  . Chest pain 08/31/2014  . Dyspnea 08/31/2014  . CHF (congestive heart failure) ([place]) 08/25/2013  . Syncope 10/09/2011  . Hyponatremia 10/09/2011  . GERD  06/17/2010  . WEIGHT LOSS, ABNORMAL 06/17/2010  . ANEMIA 04/08/2010  . Mobitz type II atrioventricular block 04/08/2010  . Hyperlipidemia 10/24/2009  . PACEMAKER, PERMANENT 09/20/2009  . Malignant neoplasm of larynx ([place]) 03/07/2009  . Aortic valve disorder 03/07/2009  . CEREBROVASCULAR DISEASE 03/07/2009  . PERIPHERAL VASCULAR DISEASE 03/07/2009  . Tobacco abuse, in remission 03/07/2009  . Hypothyroidism 12/27/2008  . OSTEOARTHRITIS 12/27/2008    Past Surgical History:  Procedure Laterality Date  . APPENDECTOMY  1973  . CATARACT EXTRACTION, BILATERAL    . DECOMPRESSION FACIAL NERVE     Right median  . INSERT / REPLACE / REMOVE PACEMAKER    . KNEE ARTHROSCOPY     Left  . LARYNGECTOMY  1988   S/P laryngectomy and radiation therapy  . PACEMAKER INSERTION    . TOTAL KNEE ARTHROPLASTY     Bilateral, 19 years ago       Home Medications    Prior to Admission medications   Medication Sig Start Date End Date Taking? Authorizing Provider  acetaminophen (TYLENOL) 500 MG tablet Take 500 mg by mouth every 4 (four) hours as needed.   Yes Historical Provider, MD  aspirin EC 81 MG tablet Take 81 mg by mouth daily.   Yes Historical Provider, MD  diclofenac sodium (VOLTAREN) 1 % GEL Apply 2 g topically daily as needed (pain).   Yes Historical Provider, MD  finasteride (PROSCAR) 5 MG tablet Take 5 mg by mouth at bedtime.   Yes Historical Provider, MD  fluconazole (DIFLUCAN) 200 MG tablet Take 200 mg by mouth daily.   Yes Historical Provider, MD  haloperidol (HALDOL) 10 MG tablet Take 10 mg by mouth 2 (two) times daily.   Yes Historical Provider, MD  haloperidol (HALDOL) 5 MG tablet Take 5 mg by mouth daily.   Yes Historical Provider, MD  levothyroxine (SYNTHROID, LEVOTHROID) 200 MCG tablet Take 200 mcg by mouth daily before breakfast.   Yes Historical Provider, MD  LORazepam (ATIVAN) 2 MG/ML concentrated solution Take 2 mg by mouth every 2 (two) hours as needed for anxiety.   Yes  Historical Provider, MD  Multiple Vitamin (MULTIVITAMIN) tablet Take 1 tablet by mouth daily.   Yes Historical Provider, MD  neomycin-bacitracin-polymyxin (NEOSPORIN) ointment Apply 1 application topically every 12 (twelve) hours. apply to eye   Yes Historical Provider, MD  oxyCODONE-acetaminophen (ROXICET) 5-325 MG/5ML solution Take 10 mLs by mouth every 2 (two) hours as needed for pain.   Yes Historical Provider, MD  OXYGEN Inhale 2 L into the lungs daily.   Yes Historical Provider, MD  pantoprazole (PROTONIX) 40 MG tablet Take 40 mg by mouth daily.    Yes Historical Provider, MD  potassium chloride (K-DUR) 10 MEQ tablet Take 1 tablet (10 mEq total) by mouth 2 (two) times daily. Patient taking differently: Take 10 mEq by mouth daily.  01/23/16  Yes Carloyn Manner  Willey Blade, MD  scopolamine (TRANSDERM-SCOP) 1 MG/3DAYS Place 1 patch onto the skin every 8 (eight) hours as needed.   Yes Historical Provider, MD  Tamsulosin HCl (FLOMAX) 0.4 MG CAPS Take 0.4 mg by mouth 2 (two) times daily.    Yes Historical Provider, MD  torsemide (DEMADEX) 20 MG tablet Take 1 tablet (20 mg total) by mouth daily. Patient taking differently: Take 10 mg by mouth daily.  08/21/16  Yes Asencion Noble, MD  Ascorbic Acid (VITAMIN C PO) Take 1 tablet by mouth daily.    Historical Provider, MD  atorvastatin (LIPITOR) 10 MG tablet Take 10 mg by mouth at bedtime.     Historical Provider, MD  cephALEXin (KEFLEX) 500 MG capsule Take 1 capsule (500 mg total) by mouth 4 (four) times daily. 09/15/16   Evalee Jefferson, PA-C  Cholecalciferol (VITAMIN D PO) Take 1 tablet by mouth daily.    Historical Provider, MD  Coenzyme Q10 (CO Q 10 PO) Take 1 tablet by mouth daily.     Historical Provider, MD  Cyanocobalamin (VITAMIN B-12 PO) Take 1 tablet by mouth daily.    Historical Provider, MD  guaifenesin (MUCUS RELIEF) 400 MG TABS tablet Take 400 mg by mouth every 4 (four) hours as needed (for mucus).    Historical Provider, MD  HYDROcodone-acetaminophen  (NORCO/VICODIN) 5-325 MG tablet Take 2 tablets by mouth every 4 (four) hours as needed. Patient taking differently: Take 2 tablets by mouth every 4 (four) hours as needed for moderate pain.  12/24/15   [name], MD  levofloxacin (LEVAQUIN) 750 MG tablet Take 1 tablet (750 mg total) by mouth daily. 12/15/16   Francine Graven, DO  magnesium oxide (MAG-OX) 400 MG tablet Take 400 mg by mouth 2 (two) times daily.    Historical Provider, MD  Melatonin 10 MG CAPS Take 1 capsule by mouth at bedtime.     Historical Provider, MD  Resveratrol 250 MG CAPS Take 1 capsule by mouth 2 (two) times daily.    Historical Provider, MD  ropinirole (REQUIP) 5 MG tablet Take 5 mg by mouth at bedtime.    Historical Provider, MD  triamcinolone (KENALOG) 0.025 % ointment Apply 1 application topically daily as needed. Skin irritation 07/24/16   Historical Provider, MD    Family History Family History  Problem Relation Age of Onset  . Stroke Mother   . Leukemia Father   . Stroke Other   . Diabetes Other   . Colon cancer Neg Hx   . Liver disease Neg Hx   . GI problems Neg Hx     Social History Social History  Substance Use Topics  . Smoking status: Former Smoker    Types: Cigarettes  . Smokeless tobacco: Former Systems developer    Quit date: 09/08/1982     Comment: Quit 30 years  . Alcohol use No     Allergies   Patient has no known allergies.   Review of Systems Review of Systems  Unable to perform ROS: Patient nonverbal     Physical Exam Updated Vital Signs BP 130/76   Pulse 87   Temp 98.3 F (36.8 C) (Oral)   Resp (!) 26   Ht 6\' 2"  (1.88 m)   Wt 160 lb (72.6 kg)   SpO2 97%   BMI 20.54 kg/m   Physical Exam  Constitutional: No distress.  Cachectic Does not appear wlel  HENT:  Sunken eyes, sunken cheeks  Eyes: Conjunctivae and EOM are normal.  Cardiovascular: Normal rate.   Pulmonary/Chest:  Effort normal.  Abdominal: Soft.  Scaphoid abdomen  Musculoskeletal: He exhibits no edema or  deformity.  Neurological: He is alert. No cranial nerve deficit. Coordination normal.  Skin: Skin is warm and dry.  Multiple pressure ulcers:  Bilateral iliac crests Sacrum Bilateral scapula Left lateral foot  Nursing note and vitals reviewed.    ED Treatments / Results  Labs (all labs ordered are listed, but only abnormal results are displayed) Labs Reviewed  COMPREHENSIVE METABOLIC PANEL - Abnormal; Notable for the following:       Result Value   Sodium 151 (*)    Chloride 114 (*)    Glucose, Bld 145 (*)    Creatinine, Ser 2.55 (*)    Albumin 2.9 (*)    AST 56 (*)    GFR calc non Af Amer 20 (*)    GFR calc Af Amer 24 (*)    All other components within normal limits  URINALYSIS, ROUTINE W REFLEX MICROSCOPIC - Abnormal; Notable for the following:    Color, Urine AMBER (*)    APPearance HAZY (*)    Leukocytes, UA SMALL (*)    Bacteria, UA RARE (*)    Squamous Epithelial / LPF 0-5 (*)    All other components within normal limits  BLOOD GAS, VENOUS - Abnormal; Notable for the following:    pCO2, Ven 41.4 (*)    pO2, Ven 60.8 (*)    All other components within normal limits  I-STAT CG4 LACTIC ACID, ED - Abnormal; Notable for the following:    Lactic Acid, Venous 2.97 (*)    All other components within normal limits  CULTURE, BLOOD (ROUTINE X 2)  CULTURE, BLOOD (ROUTINE X 2)  AMMONIA  CBC WITH DIFFERENTIAL/PLATELET    EKG  EKG Interpretation None       Radiology Dg Chest 1 View  Result Date: 12/24/2016 CLINICAL DATA:  83 year old male with altered mental status for 9 days after a fall. Initial encounter. EXAM: CHEST 1 VIEW COMPARISON:  12/15/2016 and earlier. FINDINGS: AP semi upright view of the chest at 1417 hours. Chronic elevation of the left hemidiaphragm is stable since 2017. Stable cardiac size and mediastinal contours. Left chest stool lead cardiac pacemaker. Calcified aortic atherosclerosis. Visualized tracheal air column is within normal limits. Chronic  left lung base hypo ventilation appears stable. No pneumothorax, pulmonary edema, pleural effusion or acute pulmonary opacity. Chronic levoconvex scoliosis at the thoracolumbar junction. No acute osseous abnormality identified. Negative visible bowel gas pattern. IMPRESSION: 1.  No acute cardiopulmonary abnormality. 2. Calcified aortic atherosclerosis. Electronically Signed   By: Genevie Ann M.D.   On: 12/24/2016 14:33   Ct Head Wo Contrast  Result Date: 12/24/2016 CLINICAL DATA:  Status post fall.  Altered mental status ever since. EXAM: CT HEAD WITHOUT CONTRAST TECHNIQUE: Contiguous axial images were obtained from the base of the skull through the vertex without intravenous contrast. COMPARISON:  12/15/2016 FINDINGS: Brain: No evidence of acute infarction, hemorrhage, extra-axial collection, ventriculomegaly, or mass effect. Punctate calcifications throughout the right cerebral hemisphere likely reflecting sequela of prior infectious or inflammatory process. Generalized cerebral atrophy. Periventricular white matter low attenuation likely secondary to microangiopathy. Vascular: Cerebrovascular atherosclerotic calcifications are noted. Skull: Negative for fracture or focal lesion. Sinuses/Orbits: Visualized portions of the orbits are unremarkable. Visualized portions of the paranasal sinuses and mastoid air cells are unremarkable. Other: None. IMPRESSION: 1. No acute intracranial pathology. 2. Chronic microvascular disease and cerebral atrophy. Electronically Signed   By: Kathreen Devoid   On:  12/24/2016 15:08    Procedures Procedures (including critical care time)  CRITICAL CARE Performed by: Merrily Pew Total critical care time: 35 minutes Critical care time was exclusive of separately billable procedures and treating other patients. Critical care was necessary to treat or prevent imminent or life-threatening deterioration. Critical care was time spent personally by me on the following activities:  development of treatment plan with patient and/or surrogate as well as nursing, discussions with consultants, evaluation of patient's response to treatment, examination of patient, obtaining history from patient or surrogate, ordering and performing treatments and interventions, ordering and review of laboratory studies, ordering and review of radiographic studies, pulse oximetry and re-evaluation of patient's condition.   Medications Ordered in ED Medications  cefTRIAXone (ROCEPHIN) 1 g in dextrose 5 % 50 mL IVPB (not administered)  sodium chloride 0.9 % bolus 1,000 mL (1,000 mLs Intravenous New Bag/Given 12/24/16 1451)     Initial Impression / Assessment and Plan / ED Course  I have reviewed the triage vital signs and the nursing notes.  Pertinent labs & imaging results that were available during my care of the patient were reviewed by me and considered in my medical decision making (see chart for details).     Not well appearing individual, likely malnourished and significantly altered from previous state. Multiple new pressure ulcers from same.  Suspect likely from taking haldol/ativan is reason for MS changes, but likely has metabolic complications from decreased activity as well. Will work up for same.  Family requests [facility] admission, if indicated.   Significantly eelvated BUN/Cr/Na and likely UTI c/w dehydration.  rocpehin/more fluids given. [facility] at [facility] time.  rediscussed with family, willing to cancel hospice and willing to be admitted here.   Final Clinical Impressions(s) / ED Diagnoses   Final diagnoses:  Acute renal failure, unspecified acute renal failure type ([place])  Confusion  Lactic acidemia  Urinary tract infection without hematuria, site unspecified     Merrily Pew, MD 12/24/16 2232

## 2016-12-24 NOTE — ED Triage Notes (Signed)
Patient here from home for altered mental status since for 9 days. Patient had recent fall around time onset of fall.  Patient BP 99/62. Oxygen was 85% on RA.

## 2016-12-24 NOTE — ED Notes (Addendum)
CRITICAL VALUE ALERT  Critical value received:  Lactic Acid 2.97  Date of notification:  12/24/2016  Time of notification:  4627  Critical value read back: YE  Nurse who received alert:  Lavenia Atlas, RN  MD notified:  Dr. Dayna Barker at [phone]

## 2016-12-25 LAB — CBC WITH DIFFERENTIAL/PLATELET
BASOS ABS: 0 10*3/uL (ref 0.0–0.1)
BASOS PCT: 0 %
Eosinophils Absolute: 0 10*3/uL (ref 0.0–0.7)
Eosinophils Relative: 0 %
HCT: 37.2 % — ABNORMAL LOW (ref 39.0–52.0)
HEMOGLOBIN: 12.1 g/dL — AB (ref 13.0–17.0)
LYMPHS PCT: 7 %
Lymphs Abs: 0.9 10*3/uL (ref 0.7–4.0)
MCH: 33.5 pg (ref 26.0–34.0)
MCHC: 32.5 g/dL (ref 30.0–36.0)
MCV: 103 fL — ABNORMAL HIGH (ref 78.0–100.0)
MONO ABS: 1 10*3/uL (ref 0.1–1.0)
Monocytes Relative: 8 %
NEUTROS ABS: 10.8 10*3/uL — AB (ref 1.7–7.7)
NEUTROS PCT: 85 %
PLATELETS: 216 10*3/uL (ref 150–400)
RBC: 3.61 MIL/uL — ABNORMAL LOW (ref 4.22–5.81)
RDW: 13.9 % (ref 11.5–15.5)
WBC: 12.8 10*3/uL — AB (ref 4.0–10.5)

## 2016-12-25 LAB — BASIC METABOLIC PANEL
Anion gap: 11 (ref 5–15)
BUN: 122 mg/dL — AB (ref 6–20)
CHLORIDE: 115 mmol/L — AB (ref 101–111)
CO2: 24 mmol/L (ref 22–32)
CREATININE: 1.81 mg/dL — AB (ref 0.61–1.24)
Calcium: 8.7 mg/dL — ABNORMAL LOW (ref 8.9–10.3)
GFR calc Af Amer: 36 mL/min — ABNORMAL LOW (ref >60)
GFR calc non Af Amer: 31 mL/min — ABNORMAL LOW (ref >60)
GLUCOSE: 167 mg/dL — AB (ref 65–99)
Potassium: 3.8 mmol/L (ref 3.5–5.1)
SODIUM: 150 mmol/L — AB (ref 135–145)

## 2016-12-25 LAB — MRSA PCR SCREENING: MRSA by PCR: POSITIVE — AB

## 2016-12-25 MED ORDER — FINASTERIDE 5 MG PO TABS
ORAL_TABLET | ORAL | Status: AC
  Filled 2016-12-25: qty 1

## 2016-12-25 MED ORDER — CHLORHEXIDINE GLUCONATE CLOTH 2 % EX PADS
6.0000 | MEDICATED_PAD | Freq: Every day | CUTANEOUS | Status: AC
  Administered 2016-12-25 – 2016-12-29 (×5): 6 via TOPICAL

## 2016-12-25 MED ORDER — MUPIROCIN 2 % EX OINT
1.0000 "application " | TOPICAL_OINTMENT | Freq: Two times a day (BID) | CUTANEOUS | Status: AC
  Administered 2016-12-25 – 2016-12-29 (×10): 1 via NASAL
  Filled 2016-12-25: qty 22

## 2016-12-25 NOTE — Progress Notes (Signed)
Subjective: Mr. warn was admitted yesterday after developing weakness and declining mental status. He was found to be profoundly dehydrated and hypernatremic. He has been treated with IV fluids. He has been treated for UTI with IV antibiotics. He had been in hospice although his daughter expresses significant dissatisfaction with the previous approach to his care and now desires more aggressive therapy.  Objective: Vital signs in last 24 hours: Vitals:   12/25/16 0000 12/25/16 0400 12/25/16 0600 12/25/16 0732  BP:    107/71  Pulse:    62  Resp:    15  Temp: 97.1 F (36.2 C) 98 F (36.7 C)  99.1 F (37.3 C)  TempSrc: Axillary Oral  Oral  SpO2:    96%  Weight:   136 lb 0.4 oz (61.7 kg)   Height:       Weight change:   Intake/Output Summary (Last 24 hours) at 12/25/16 0739 Last data filed at 12/25/16 0500  Gross per 24 hour  Intake             2000 ml  Output              500 ml  Net             1500 ml    Physical Exam: Weak appearing. Oropharynx dry. Lungs clear. Heart regular with a grade 3 systolic murmur. Abdomen is soft and nontender. Extremities reveal no edema.  Lab Results:    Results for orders placed or performed during the hospital encounter of 12/24/16 (from the past 24 hour(s))  Blood gas, venous ([facility], [facility], [facility])     Status: Abnormal   Collection Time: 12/24/16  1:58 PM  Result Value Ref Range   FIO2 0.21    pH, Ven 7.395 7.250 - 7.430   pCO2, Ven 41.4 (L) 44.0 - 60.0 mmHg   pO2, Ven 60.8 (H) 32.0 - 45.0 mmHg   Bicarbonate 24.5 20.0 - 28.0 mmol/L   Acid-Base Excess 0.5 0.0 - 2.0 mmol/L   O2 Saturation 88.5 %   Patient temperature 37.0    Collection site VENOUS    Drawn by DRAWN BY RN    Sample type VENOUS   I-Stat CG4 Lactic Acid, ED  (not at  [facility])     Status: Abnormal   Collection Time: 12/24/16  2:47 PM  Result Value Ref Range   Lactic Acid, Venous 2.97 (HH) 0.5 - 1.9 mmol/L  Comprehensive metabolic panel     Status: Abnormal   Collection Time:  12/24/16  2:53 PM  Result Value Ref Range   Sodium 151 (H) 135 - 145 mmol/L   Potassium 5.0 3.5 - 5.1 mmol/L   Chloride 114 (H) 101 - 111 mmol/L   CO2 26 22 - 32 mmol/L   Glucose, Bld 145 (H) 65 - 99 mg/dL   BUN 129 (H) 6 - 20 mg/dL   Creatinine, Ser 2.55 (H) 0.61 - 1.24 mg/dL   Calcium 9.0 8.9 - 10.3 mg/dL   Total Protein 7.5 6.5 - 8.1 g/dL   Albumin 2.9 (L) 3.5 - 5.0 g/dL   AST 56 (H) 15 - 41 U/L   ALT 26 17 - 63 U/L   Alkaline Phosphatase 98 38 - 126 U/L   Total Bilirubin 0.9 0.3 - 1.2 mg/dL   GFR calc non Af Amer 20 (L) >60 mL/min   GFR calc Af Amer 24 (L) >60 mL/min   Anion gap 11 5 - 15  CBC WITH DIFFERENTIAL  Status: Abnormal   Collection Time: 12/24/16  2:53 PM  Result Value Ref Range   WBC 18.3 (H) 4.0 - 10.5 K/uL   RBC 4.05 (L) 4.22 - 5.81 MIL/uL   Hemoglobin 13.8 13.0 - 17.0 g/dL   HCT 41.3 39.0 - 52.0 %   MCV 102.0 (H) 78.0 - 100.0 fL   MCH 34.1 (H) 26.0 - 34.0 pg   MCHC 33.4 30.0 - 36.0 g/dL   RDW 14.0 11.5 - 15.5 %   Platelets 275 150 - 400 K/uL   Neutrophils Relative % 85 %   Neutro Abs 15.6 (H) 1.7 - 7.7 K/uL   Lymphocytes Relative 7 %   Lymphs Abs 1.4 0.7 - 4.0 K/uL   Monocytes Relative 8 %   Monocytes Absolute 1.4 (H) 0.1 - 1.0 K/uL   Eosinophils Relative 0 %   Eosinophils Absolute 0.0 0.0 - 0.7 K/uL   Basophils Relative 0 %   Basophils Absolute 0.0 0.0 - 0.1 K/uL  Blood Culture (routine x 2)     Status: None (Preliminary result)   Collection Time: 12/24/16  2:53 PM  Result Value Ref Range   Specimen Description BLOOD RIGHT HAND    Special Requests      BOTTLES DRAWN AEROBIC ONLY Blood Culture adequate volume   Culture PENDING    Report Status PENDING   Ammonia     Status: None   Collection Time: 12/24/16  2:53 PM  Result Value Ref Range   Ammonia 23 9 - 35 umol/L  Urinalysis, Routine w reflex microscopic     Status: Abnormal   Collection Time: 12/24/16  2:58 PM  Result Value Ref Range   Color, Urine AMBER (A) YELLOW   APPearance HAZY (A)  CLEAR   Specific Gravity, Urine 1.017 1.005 - 1.030   pH 5.0 5.0 - 8.0   Glucose, UA NEGATIVE NEGATIVE mg/dL   Hgb urine dipstick NEGATIVE NEGATIVE   Bilirubin Urine NEGATIVE NEGATIVE   Ketones, ur NEGATIVE NEGATIVE mg/dL   Protein, ur NEGATIVE NEGATIVE mg/dL   Nitrite NEGATIVE NEGATIVE   Leukocytes, UA SMALL (A) NEGATIVE   RBC / HPF 0-5 0 - 5 RBC/hpf   WBC, UA 6-30 0 - 5 WBC/hpf   Bacteria, UA RARE (A) NONE SEEN   Squamous Epithelial / LPF 0-5 (A) NONE SEEN   Budding Yeast PRESENT    Hyaline Casts, UA PRESENT   Blood Culture (routine x 2)     Status: None (Preliminary result)   Collection Time: 12/24/16  2:59 PM  Result Value Ref Range   Specimen Description BLOOD LEFT HAND    Special Requests      BOTTLES DRAWN AEROBIC AND ANAEROBIC Blood Culture adequate volume   Culture PENDING    Report Status PENDING   I-Stat CG4 Lactic Acid, ED  (not at  [facility])     Status: None   Collection Time: 12/24/16  6:04 PM  Result Value Ref Range   Lactic Acid, Venous 1.37 0.5 - 1.9 mmol/L  MRSA PCR Screening     Status: Abnormal   Collection Time: 12/24/16  8:37 PM  Result Value Ref Range   MRSA by PCR POSITIVE (A) NEGATIVE  CBC WITH DIFFERENTIAL     Status: Abnormal   Collection Time: 12/25/16  4:48 AM  Result Value Ref Range   WBC 12.8 (H) 4.0 - 10.5 K/uL   RBC 3.61 (L) 4.22 - 5.81 MIL/uL   Hemoglobin 12.1 (L) 13.0 - 17.0 g/dL   HCT  37.2 (L) 39.0 - 52.0 %   MCV 103.0 (H) 78.0 - 100.0 fL   MCH 33.5 26.0 - 34.0 pg   MCHC 32.5 30.0 - 36.0 g/dL   RDW 13.9 11.5 - 15.5 %   Platelets 216 150 - 400 K/uL   Neutrophils Relative % 85 %   Neutro Abs 10.8 (H) 1.7 - 7.7 K/uL   Lymphocytes Relative 7 %   Lymphs Abs 0.9 0.7 - 4.0 K/uL   Monocytes Relative 8 %   Monocytes Absolute 1.0 0.1 - 1.0 K/uL   Eosinophils Relative 0 %   Eosinophils Absolute 0.0 0.0 - 0.7 K/uL   Basophils Relative 0 %   Basophils Absolute 0.0 0.0 - 0.1 K/uL  Basic metabolic panel     Status: Abnormal   Collection Time:  12/25/16  4:48 AM  Result Value Ref Range   Sodium 150 (H) 135 - 145 mmol/L   Potassium 3.8 3.5 - 5.1 mmol/L   Chloride 115 (H) 101 - 111 mmol/L   CO2 24 22 - 32 mmol/L   Glucose, Bld 167 (H) 65 - 99 mg/dL   BUN 122 (H) 6 - 20 mg/dL   Creatinine, Ser 1.81 (H) 0.61 - 1.24 mg/dL   Calcium 8.7 (L) 8.9 - 10.3 mg/dL   GFR calc non Af Amer 31 (L) >60 mL/min   GFR calc Af Amer 36 (L) >60 mL/min   Anion gap 11 5 - 15     ABGS  Recent Labs  12/24/16 1358  HCO3 24.5   CULTURES Recent Results (from the past 240 hour(s))  Culture, Urine     Status: None   Collection Time: 12/18/16 11:35 AM  Result Value Ref Range Status   Specimen Description URINE, RANDOM  Final   Special Requests NONE  Final   Culture   Final    NO GROWTH Performed at [facility], [address], [place], [place] [address]    Report Status 12/20/2016 FINAL  Final  Blood Culture (routine x 2)     Status: None (Preliminary result)   Collection Time: 12/24/16  2:53 PM  Result Value Ref Range Status   Specimen Description BLOOD RIGHT HAND  Final   Special Requests   Final    BOTTLES DRAWN AEROBIC ONLY Blood Culture adequate volume   Culture PENDING  Incomplete   Report Status PENDING  Incomplete  Blood Culture (routine x 2)     Status: None (Preliminary result)   Collection Time: 12/24/16  2:59 PM  Result Value Ref Range Status   Specimen Description BLOOD LEFT HAND  Final   Special Requests   Final    BOTTLES DRAWN AEROBIC AND ANAEROBIC Blood Culture adequate volume   Culture PENDING  Incomplete   Report Status PENDING  Incomplete  MRSA PCR Screening     Status: Abnormal   Collection Time: 12/24/16  8:37 PM  Result Value Ref Range Status   MRSA by PCR POSITIVE (A) NEGATIVE Final    Comment:        The GeneXpert MRSA Assay (FDA approved for NASAL specimens only), is one component of a comprehensive MRSA colonization surveillance program. It is not intended to diagnose MRSA infection nor to guide  or monitor treatment for MRSA infections. RESULT CALLED TO, READ BACK BY AND VERIFIED WITH:  SMITH,F @ 0008 ON 12/25/16 BY JUW    Studies/Results: Dg Chest 1 View  Result Date: 12/24/2016 CLINICAL DATA:  83 year old male with altered mental status  for 9 days after a fall. Initial encounter. EXAM: CHEST 1 VIEW COMPARISON:  12/15/2016 and earlier. FINDINGS: AP semi upright view of the chest at 1417 hours. Chronic elevation of the left hemidiaphragm is stable since 2017. Stable cardiac size and mediastinal contours. Left chest stool lead cardiac pacemaker. Calcified aortic atherosclerosis. Visualized tracheal air column is within normal limits. Chronic left lung base hypo ventilation appears stable. No pneumothorax, pulmonary edema, pleural effusion or acute pulmonary opacity. Chronic levoconvex scoliosis at the thoracolumbar junction. No acute osseous abnormality identified. Negative visible bowel gas pattern. IMPRESSION: 1.  No acute cardiopulmonary abnormality. 2. Calcified aortic atherosclerosis. Electronically Signed   By: Genevie Ann M.D.   On: 12/24/2016 14:33   Ct Head Wo Contrast  Result Date: 12/24/2016 CLINICAL DATA:  Status post fall.  Altered mental status ever since. EXAM: CT HEAD WITHOUT CONTRAST TECHNIQUE: Contiguous axial images were obtained from the base of the skull through the vertex without intravenous contrast. COMPARISON:  12/15/2016 FINDINGS: Brain: No evidence of acute infarction, hemorrhage, extra-axial collection, ventriculomegaly, or mass effect. Punctate calcifications throughout the right cerebral hemisphere likely reflecting sequela of prior infectious or inflammatory process. Generalized cerebral atrophy. Periventricular white matter low attenuation likely secondary to microangiopathy. Vascular: Cerebrovascular atherosclerotic calcifications are noted. Skull: Negative for fracture or focal lesion. Sinuses/Orbits: Visualized portions of the orbits are unremarkable. Visualized  portions of the paranasal sinuses and mastoid air cells are unremarkable. Other: None. IMPRESSION: 1. No acute intracranial pathology. 2. Chronic microvascular disease and cerebral atrophy. Electronically Signed   By: Kathreen Devoid   On: 12/24/2016 15:08   Micro Results: Recent Results (from the past 240 hour(s))  Culture, Urine     Status: None   Collection Time: 12/18/16 11:35 AM  Result Value Ref Range Status   Specimen Description URINE, RANDOM  Final   Special Requests NONE  Final   Culture   Final    NO GROWTH Performed at [facility], [address], [place], [place] [address]    Report Status 12/20/2016 FINAL  Final  Blood Culture (routine x 2)     Status: None (Preliminary result)   Collection Time: 12/24/16  2:53 PM  Result Value Ref Range Status   Specimen Description BLOOD RIGHT HAND  Final   Special Requests   Final    BOTTLES DRAWN AEROBIC ONLY Blood Culture adequate volume   Culture PENDING  Incomplete   Report Status PENDING  Incomplete  Blood Culture (routine x 2)     Status: None (Preliminary result)   Collection Time: 12/24/16  2:59 PM  Result Value Ref Range Status   Specimen Description BLOOD LEFT HAND  Final   Special Requests   Final    BOTTLES DRAWN AEROBIC AND ANAEROBIC Blood Culture adequate volume   Culture PENDING  Incomplete   Report Status PENDING  Incomplete  MRSA PCR Screening     Status: Abnormal   Collection Time: 12/24/16  8:37 PM  Result Value Ref Range Status   MRSA by PCR POSITIVE (A) NEGATIVE Final    Comment:        The GeneXpert MRSA Assay (FDA approved for NASAL specimens only), is one component of a comprehensive MRSA colonization surveillance program. It is not intended to diagnose MRSA infection nor to guide or monitor treatment for MRSA infections. RESULT CALLED TO, READ BACK BY AND VERIFIED WITH:  SMITH,F @ 0008 ON 12/25/16 BY JUW    Studies/Results: Dg Chest 1 View  Result Date:  12/24/2016 CLINICAL DATA:   83 year old male with altered mental status for 9 days after a fall. Initial encounter. EXAM: CHEST 1 VIEW COMPARISON:  12/15/2016 and earlier. FINDINGS: AP semi upright view of the chest at 1417 hours. Chronic elevation of the left hemidiaphragm is stable since 2017. Stable cardiac size and mediastinal contours. Left chest stool lead cardiac pacemaker. Calcified aortic atherosclerosis. Visualized tracheal air column is within normal limits. Chronic left lung base hypo ventilation appears stable. No pneumothorax, pulmonary edema, pleural effusion or acute pulmonary opacity. Chronic levoconvex scoliosis at the thoracolumbar junction. No acute osseous abnormality identified. Negative visible bowel gas pattern. IMPRESSION: 1.  No acute cardiopulmonary abnormality. 2. Calcified aortic atherosclerosis. Electronically Signed   By: Genevie Ann M.D.   On: 12/24/2016 14:33   Ct Head Wo Contrast  Result Date: 12/24/2016 CLINICAL DATA:  Status post fall.  Altered mental status ever since. EXAM: CT HEAD WITHOUT CONTRAST TECHNIQUE: Contiguous axial images were obtained from the base of the skull through the vertex without intravenous contrast. COMPARISON:  12/15/2016 FINDINGS: Brain: No evidence of acute infarction, hemorrhage, extra-axial collection, ventriculomegaly, or mass effect. Punctate calcifications throughout the right cerebral hemisphere likely reflecting sequela of prior infectious or inflammatory process. Generalized cerebral atrophy. Periventricular white matter low attenuation likely secondary to microangiopathy. Vascular: Cerebrovascular atherosclerotic calcifications are noted. Skull: Negative for fracture or focal lesion. Sinuses/Orbits: Visualized portions of the orbits are unremarkable. Visualized portions of the paranasal sinuses and mastoid air cells are unremarkable. Other: None. IMPRESSION: 1. No acute intracranial pathology. 2. Chronic microvascular disease and cerebral atrophy. Electronically Signed    By: Kathreen Devoid   On: 12/24/2016 15:08   Medications:  I have reviewed the patient's current medications Scheduled Meds: . aspirin EC  81 mg Oral Daily  . chlorhexidine  15 mL Mouth Rinse BID  . Chlorhexidine Gluconate Cloth  6 each Topical Q0600  . finasteride  5 mg Oral QHS  . fluconazole (DIFLUCAN) IV  100 mg Intravenous Q24H  . heparin  5,000 Units Subcutaneous Q8H  . levothyroxine  200 mcg Oral QAC breakfast  . mouth rinse  15 mL Mouth Rinse q12n4p  . mupirocin ointment  1 application Nasal BID  . pantoprazole  40 mg Oral Daily  . tamsulosin  0.4 mg Oral BID   Continuous Infusions: . cefTRIAXone (ROCEPHIN)  IV    . dextrose 150 mL/hr at 12/25/16 0323  . famotidine (PEPCID) IV Stopped (12/24/16 2142)   PRN Meds:.acetaminophen, guaiFENesin, morphine injection, ondansetron **OR** ondansetron (ZOFRAN) IV   Assessment/Plan: #1. Hypernatremia/dehydration. Continue IV fluids. Serum sodium now 151. Creatinine improved. #2. Chronic systolic heart failure. Hold diuretic therapy. #3. Aortic stenosis. #4. History of laryngeal cancer status post laryngectomy. #5. Decubiti. Local care. #6. Hypothyroidism. Continue levothyroxine. #7. UTI. Continue ceftriaxone. Principal Problem:   Hypernatremia Active Problems:   Hypothyroidism   Chronic systolic congestive heart failure, NYHA class 2 (HCC)   Anemia   Pressure injury of skin   UTI (urinary tract infection)     LOS: 1 day   Laythan Hayter 12/25/2016, 7:39 AM

## 2016-12-26 LAB — BASIC METABOLIC PANEL
Anion gap: 9 (ref 5–15)
BUN: 90 mg/dL — ABNORMAL HIGH (ref 6–20)
CHLORIDE: 112 mmol/L — AB (ref 101–111)
CO2: 23 mmol/L (ref 22–32)
Calcium: 8.4 mg/dL — ABNORMAL LOW (ref 8.9–10.3)
Creatinine, Ser: 1.36 mg/dL — ABNORMAL HIGH (ref 0.61–1.24)
GFR, EST AFRICAN AMERICAN: 51 mL/min — AB (ref >60)
GFR, EST NON AFRICAN AMERICAN: 44 mL/min — AB (ref >60)
Glucose, Bld: 138 mg/dL — ABNORMAL HIGH (ref 65–99)
POTASSIUM: 3.4 mmol/L — AB (ref 3.5–5.1)
SODIUM: 144 mmol/L (ref 135–145)

## 2016-12-26 LAB — CBC WITH DIFFERENTIAL/PLATELET
BASOS ABS: 0 10*3/uL (ref 0.0–0.1)
BASOS PCT: 0 %
EOS ABS: 0 10*3/uL (ref 0.0–0.7)
EOS PCT: 0 %
HCT: 35.1 % — ABNORMAL LOW (ref 39.0–52.0)
HEMOGLOBIN: 11.8 g/dL — AB (ref 13.0–17.0)
LYMPHS ABS: 0.8 10*3/uL (ref 0.7–4.0)
Lymphocytes Relative: 6 %
MCH: 34.2 pg — AB (ref 26.0–34.0)
MCHC: 33.6 g/dL (ref 30.0–36.0)
MCV: 101.7 fL — ABNORMAL HIGH (ref 78.0–100.0)
Monocytes Absolute: 1 10*3/uL (ref 0.1–1.0)
Monocytes Relative: 8 %
Neutro Abs: 11 10*3/uL — ABNORMAL HIGH (ref 1.7–7.7)
Neutrophils Relative %: 86 %
PLATELETS: 187 10*3/uL (ref 150–400)
RBC: 3.45 MIL/uL — AB (ref 4.22–5.81)
RDW: 13.7 % (ref 11.5–15.5)
WBC: 12.8 10*3/uL — AB (ref 4.0–10.5)

## 2016-12-26 MED ORDER — POTASSIUM CHLORIDE 10 MEQ/100ML IV SOLN
10.0000 meq | INTRAVENOUS | Status: AC
Start: 2016-12-26 — End: 2016-12-26
  Administered 2016-12-26 (×3): 10 meq via INTRAVENOUS
  Filled 2016-12-26: qty 100

## 2016-12-26 MED ORDER — SODIUM CHLORIDE 0.9 % IV SOLN
30.0000 meq | Freq: Once | INTRAVENOUS | Status: DC
  Filled 2016-12-26: qty 15

## 2016-12-26 NOTE — Progress Notes (Signed)
Subjective: He has had a large bowel movement this morning. He otherwise had an uneventful night. No fever has been noted. He has been able to eat some soft food. Objective: Vital signs in last 24 hours: Vitals:   12/25/16 2300 12/26/16 0000 12/26/16 0400 12/26/16 0600  BP: (!) 95/59 (!) 83/62    Pulse: 62 63    Resp: (!) 25 16    Temp:  97.8 F (36.6 C) 98.1 F (36.7 C)   TempSrc:  Oral Axillary   SpO2: 97% 98%    Weight:    137 lb 12.6 oz (62.5 kg)  Height:       Weight change: -22 lb 3.4 oz (-10.076 kg)  Intake/Output Summary (Last 24 hours) at 12/26/16 0713 Last data filed at 12/26/16 0500  Gross per 24 hour  Intake                0 ml  Output              600 ml  Net             -600 ml    Physical Exam: Alert. Oropharynx dry. Lungs clear. Heart regular in a paced rhythm on telemetry with a grade 3 systolic murmur. Abdomen is flat. Extremities reveal no edema. Sacral wounds are covered.  Lab Results:    Results for orders placed or performed during the hospital encounter of 12/24/16 (from the past 24 hour(s))  CBC WITH DIFFERENTIAL     Status: Abnormal   Collection Time: 12/26/16  4:10 AM  Result Value Ref Range   WBC 12.8 (H) 4.0 - 10.5 K/uL   RBC 3.45 (L) 4.22 - 5.81 MIL/uL   Hemoglobin 11.8 (L) 13.0 - 17.0 g/dL   HCT 35.1 (L) 39.0 - 52.0 %   MCV 101.7 (H) 78.0 - 100.0 fL   MCH 34.2 (H) 26.0 - 34.0 pg   MCHC 33.6 30.0 - 36.0 g/dL   RDW 13.7 11.5 - 15.5 %   Platelets 187 150 - 400 K/uL   Neutrophils Relative % 86 %   Neutro Abs 11.0 (H) 1.7 - 7.7 K/uL   Lymphocytes Relative 6 %   Lymphs Abs 0.8 0.7 - 4.0 K/uL   Monocytes Relative 8 %   Monocytes Absolute 1.0 0.1 - 1.0 K/uL   Eosinophils Relative 0 %   Eosinophils Absolute 0.0 0.0 - 0.7 K/uL   Basophils Relative 0 %   Basophils Absolute 0.0 0.0 - 0.1 K/uL  Basic metabolic panel     Status: Abnormal   Collection Time: 12/26/16  4:10 AM  Result Value Ref Range   Sodium 144 135 - 145 mmol/L   Potassium  3.4 (L) 3.5 - 5.1 mmol/L   Chloride 112 (H) 101 - 111 mmol/L   CO2 23 22 - 32 mmol/L   Glucose, Bld 138 (H) 65 - 99 mg/dL   BUN 90 (H) 6 - 20 mg/dL   Creatinine, Ser 1.36 (H) 0.61 - 1.24 mg/dL   Calcium 8.4 (L) 8.9 - 10.3 mg/dL   GFR calc non Af Amer 44 (L) >60 mL/min   GFR calc Af Amer 51 (L) >60 mL/min   Anion gap 9 5 - 15     ABGS  Recent Labs  12/24/16 1358  HCO3 24.5   CULTURES Recent Results (from the past 240 hour(s))  Culture, Urine     Status: None   Collection Time: 12/18/16 11:35 AM  Result Value Ref Range Status  Specimen Description URINE, RANDOM  Final   Special Requests NONE  Final   Culture   Final    NO GROWTH Performed at [facility], [place] [address], [place], [place] [address]    Report Status 12/20/2016 FINAL  Final  Blood Culture (routine x 2)     Status: None (Preliminary result)   Collection Time: 12/24/16  2:53 PM  Result Value Ref Range Status   Specimen Description BLOOD RIGHT HAND  Final   Special Requests   Final    BOTTLES DRAWN AEROBIC ONLY Blood Culture adequate volume   Culture NO GROWTH < 24 HOURS  Final   Report Status PENDING  Incomplete  Blood Culture (routine x 2)     Status: None (Preliminary result)   Collection Time: 12/24/16  2:59 PM  Result Value Ref Range Status   Specimen Description BLOOD LEFT HAND  Final   Special Requests   Final    BOTTLES DRAWN AEROBIC AND ANAEROBIC Blood Culture adequate volume   Culture NO GROWTH < 24 HOURS  Final   Report Status PENDING  Incomplete  MRSA PCR Screening     Status: Abnormal   Collection Time: 12/24/16  8:37 PM  Result Value Ref Range Status   MRSA by PCR POSITIVE (A) NEGATIVE Final    Comment:        The GeneXpert MRSA Assay (FDA approved for NASAL specimens only), is one component of a comprehensive MRSA colonization surveillance program. It is not intended to diagnose MRSA infection nor to guide or monitor treatment for MRSA infections. RESULT CALLED TO, READ BACK  BY AND VERIFIED WITH:  SMITH,F @ 0008 ON 12/25/16 BY JUW    Studies/Results: Dg Chest 1 View  Result Date: 12/24/2016 CLINICAL DATA:  83 year old male with altered mental status for 9 days after a fall. Initial encounter. EXAM: CHEST 1 VIEW COMPARISON:  12/15/2016 and earlier. FINDINGS: AP semi upright view of the chest at 1417 hours. Chronic elevation of the left hemidiaphragm is stable since 2017. Stable cardiac size and mediastinal contours. Left chest stool lead cardiac pacemaker. Calcified aortic atherosclerosis. Visualized tracheal air column is within normal limits. Chronic left lung base hypo ventilation appears stable. No pneumothorax, pulmonary edema, pleural effusion or acute pulmonary opacity. Chronic levoconvex scoliosis at the thoracolumbar junction. No acute osseous abnormality identified. Negative visible bowel gas pattern. IMPRESSION: 1.  No acute cardiopulmonary abnormality. 2. Calcified aortic atherosclerosis. Electronically Signed   By: Genevie Ann M.D.   On: 12/24/2016 14:33   Ct Head Wo Contrast  Result Date: 12/24/2016 CLINICAL DATA:  Status post fall.  Altered mental status ever since. EXAM: CT HEAD WITHOUT CONTRAST TECHNIQUE: Contiguous axial images were obtained from the base of the skull through the vertex without intravenous contrast. COMPARISON:  12/15/2016 FINDINGS: Brain: No evidence of acute infarction, hemorrhage, extra-axial collection, ventriculomegaly, or mass effect. Punctate calcifications throughout the right cerebral hemisphere likely reflecting sequela of prior infectious or inflammatory process. Generalized cerebral atrophy. Periventricular white matter low attenuation likely secondary to microangiopathy. Vascular: Cerebrovascular atherosclerotic calcifications are noted. Skull: Negative for fracture or focal lesion. Sinuses/Orbits: Visualized portions of the orbits are unremarkable. Visualized portions of the paranasal sinuses and mastoid air cells are unremarkable.  Other: None. IMPRESSION: 1. No acute intracranial pathology. 2. Chronic microvascular disease and cerebral atrophy. Electronically Signed   By: Kathreen Devoid   On: 12/24/2016 15:08   Micro Results: Recent Results (from the past 240 hour(s))  Culture, Urine  Status: None   Collection Time: 12/18/16 11:35 AM  Result Value Ref Range Status   Specimen Description URINE, RANDOM  Final   Special Requests NONE  Final   Culture   Final    NO GROWTH Performed at [facility], [address] [place], [place] [address]    Report Status 12/20/2016 FINAL  Final  Blood Culture (routine x 2)     Status: None (Preliminary result)   Collection Time: 12/24/16  2:53 PM  Result Value Ref Range Status   Specimen Description BLOOD RIGHT HAND  Final   Special Requests   Final    BOTTLES DRAWN AEROBIC ONLY Blood Culture adequate volume   Culture NO GROWTH < 24 HOURS  Final   Report Status PENDING  Incomplete  Blood Culture (routine x 2)     Status: None (Preliminary result)   Collection Time: 12/24/16  2:59 PM  Result Value Ref Range Status   Specimen Description BLOOD LEFT HAND  Final   Special Requests   Final    BOTTLES DRAWN AEROBIC AND ANAEROBIC Blood Culture adequate volume   Culture NO GROWTH < 24 HOURS  Final   Report Status PENDING  Incomplete  MRSA PCR Screening     Status: Abnormal   Collection Time: 12/24/16  8:37 PM  Result Value Ref Range Status   MRSA by PCR POSITIVE (A) NEGATIVE Final    Comment:        The GeneXpert MRSA Assay (FDA approved for NASAL specimens only), is one component of a comprehensive MRSA colonization surveillance program. It is not intended to diagnose MRSA infection nor to guide or monitor treatment for MRSA infections. RESULT CALLED TO, READ BACK BY AND VERIFIED WITH:  SMITH,F @ 0008 ON 12/25/16 BY JUW    Studies/Results: Dg Chest 1 View  Result Date: 12/24/2016 CLINICAL DATA:  83 year old male with altered mental status for 9 days after a  fall. Initial encounter. EXAM: CHEST 1 VIEW COMPARISON:  12/15/2016 and earlier. FINDINGS: AP semi upright view of the chest at 1417 hours. Chronic elevation of the left hemidiaphragm is stable since 2017. Stable cardiac size and mediastinal contours. Left chest stool lead cardiac pacemaker. Calcified aortic atherosclerosis. Visualized tracheal air column is within normal limits. Chronic left lung base hypo ventilation appears stable. No pneumothorax, pulmonary edema, pleural effusion or acute pulmonary opacity. Chronic levoconvex scoliosis at the thoracolumbar junction. No acute osseous abnormality identified. Negative visible bowel gas pattern. IMPRESSION: 1.  No acute cardiopulmonary abnormality. 2. Calcified aortic atherosclerosis. Electronically Signed   By: Genevie Ann M.D.   On: 12/24/2016 14:33   Ct Head Wo Contrast  Result Date: 12/24/2016 CLINICAL DATA:  Status post fall.  Altered mental status ever since. EXAM: CT HEAD WITHOUT CONTRAST TECHNIQUE: Contiguous axial images were obtained from the base of the skull through the vertex without intravenous contrast. COMPARISON:  12/15/2016 FINDINGS: Brain: No evidence of acute infarction, hemorrhage, extra-axial collection, ventriculomegaly, or mass effect. Punctate calcifications throughout the right cerebral hemisphere likely reflecting sequela of prior infectious or inflammatory process. Generalized cerebral atrophy. Periventricular white matter low attenuation likely secondary to microangiopathy. Vascular: Cerebrovascular atherosclerotic calcifications are noted. Skull: Negative for fracture or focal lesion. Sinuses/Orbits: Visualized portions of the orbits are unremarkable. Visualized portions of the paranasal sinuses and mastoid air cells are unremarkable. Other: None. IMPRESSION: 1. No acute intracranial pathology. 2. Chronic microvascular disease and cerebral atrophy. Electronically Signed   By: Kathreen Devoid   On: 12/24/2016 15:08  Medications:  I have  reviewed the patient's current medications Scheduled Meds: . aspirin EC  81 mg Oral Daily  . chlorhexidine  15 mL Mouth Rinse BID  . Chlorhexidine Gluconate Cloth  6 each Topical Q0600  . finasteride  5 mg Oral QHS  . fluconazole (DIFLUCAN) IV  100 mg Intravenous Q24H  . heparin  5,000 Units Subcutaneous Q8H  . levothyroxine  200 mcg Oral QAC breakfast  . mouth rinse  15 mL Mouth Rinse q12n4p  . mupirocin ointment  1 application Nasal BID  . pantoprazole  40 mg Oral Daily  . tamsulosin  0.4 mg Oral BID   Continuous Infusions: . cefTRIAXone (ROCEPHIN)  IV Stopped (12/25/16 1531)  . dextrose 125 mL/hr at 12/25/16 2352  . famotidine (PEPCID) IV Stopped (12/25/16 2223)   PRN Meds:.acetaminophen, guaiFENesin, ondansetron **OR** ondansetron (ZOFRAN) IV   Assessment/Plan: #1. Hypernatremia. Improved with IV fluids. Sodium is down to 144. #2. Acute kidney injury/marked dehydration. BUN and creatinine are improved. 90 and 1.36 today. Continue IV fluids but will reduce his rate to 100 ML's per hour. #3. Chronic systolic heart failure. No recurrence thus far with rehydration. Continue holding diuretic therapy. #4. UTI. Blood and urine cultures pending. White count 12.8. Continue ceftriaxone. #5. Hypokalemia. Replace today. #6. Status post pacemaker placement. Stable. Principal Problem:   Hypernatremia Active Problems:   Hypothyroidism   Chronic systolic congestive heart failure, NYHA class 2 (HCC)   Anemia   Pressure injury of skin   UTI (urinary tract infection)     LOS: 2 days   Andre Holder 12/26/2016, 7:13 AM

## 2016-12-26 NOTE — Care Management Note (Signed)
Case Management Note  Patient Details  Name: Andre Holder MRN: [id number] Date of Birth: [birth year]  Subjective/Objective:  Adm with hypernatremia. Came from hospice, daughter states he was there for wife to have a respite time. She is adamant that patient will not go back to hospice. CM discussed that [facility] called and wanting to know if family wanted patient to transfer to [place]. Daughter states that they wanted him transferred to [place] from our ER but [place] wouldn't accept patient. Daughter did not have a reason why, ? No beds. [facility]. Daughter is unsure if she wants patient to transfer, she would like to talk with her mother, patient's wife. Daughter is also unsure of what she wants when patient discharges. ? Home with home health vs SNF.  Vs home with different hospice service              Action/Plan: CM will follow. Daughter to contact CM with answer regarding [facility].    Expected Discharge Date:       12/29/2016           Expected Discharge Plan:     In-House Referral:     Discharge planning Services  CM Consult  Post Acute Care Choice:    Choice offered to:     DME Arranged:    DME Agency:     HH Arranged:    HH Agency:     Status of Service:  In process, will continue to follow  If discussed at Long Length of Stay Meetings, dates discussed:    Additional Comments:  Soila Printup, Chauncey Reading, RN 12/26/2016, 3:35 PM

## 2016-12-26 NOTE — Progress Notes (Addendum)
Initial Nutrition Assessment  DOCUMENTATION CODES:  Severe malnutrition in context of acute illness/injury, Underweight  INTERVENTION:  Downgrade diet to d1 w/ d2 meats only.   Regular Boost on Lunch and Dinner trays  NUTRITION DIAGNOSIS:  Malnutrition (Severe) related to acute illness (UTI) as evidenced by an estimated energy intake that has met < or equal to 50% of needs for > or equal to 5 days, severe depletion of muscle mass, severe depletion of body fat.  GOAL:  Patient will meet greater than or equal to 90% of their needs  MONITOR:  PO intake, Supplement acceptance, Diet advancement, Labs  REASON FOR ASSESSMENT:  Low Braden    ASSESSMENT:  83 y/o male PMHx Adrenal hyperplasia, CAD, HF, AV block, Pacemaker, PVD, HTN, HLD, GERD, DJD, Laryngeal cancer s/p laryngectomy and tracheostomy. Presented to ED with AMS x9 days after falling. Has had poor intake, weakness, and has developed decubitus ulcers. Found to have hypernatremia and UTI.   Pt is disoriented. Wife is at bedside.   Per her report, the patient has been on hospice for just the past few weeks. Around that time, patient started to have a decrease in overall status, which wife believes was due to UTI beginning. Roughly 9 days ago, the patient was taken to the hospice home for ~5 days to give the family a break. Here, the wife says he declined dramatically, "They starved him".   Prior to acute illness, patient was eating well and was very functional. Reportedly he could do ADLs and could walk without much difficulty. He was eating a puree diet at home and drinking Boost. He has dentures, but these have been painful for him to wear due to a sore mouth that the wife relates to thrush.   Per her report, his UBW was 158 lbs and he weighed this up until 3 weeks ago. There is no direct evidence to support this drastic loss in such a short time frame, but had been weighed 159-160 late 2017 and early ths year. He presented at just  over 133 lbs, but was volume depleted, has regained a few lbs as he has been rehydrated  At this time, the patient is not eating too well. His untouched lunch tray is seen at bedside. The wife notes his foods need to be pureed. RD spoke with dietary ambassador who was rounding at this time. Will downgrade diet to D1, with D2 meats. Will also add regular Boost to trays.   Wife denies the patient having any N/V/C/D.   Wife is emotional and very displeased with hospice care and believes he would be well now if she hadnt taken him to the hospice home. She voiced that she feels guilty and blames herself for his decline. Would definitely benefit from emotional support  NFPE: Profoundly cachectic, with the exception of his interosseous muscles, which are intact.  Labs: BG 140-170, Renal labs and hypernatremia improving. WBC: 12.8, Albumin: 2.9, H/H:11.8/35.1 Medications: Diflucan, ppi, IV abx, IVF D5   Recent Labs Lab 12/24/16 1453 12/25/16 0448 12/26/16 0410  NA 151* 150* 144  K 5.0 3.8 3.4*  CL 114* 115* 112*  CO2 '26 24 23  ' BUN 129* 122* 90*  CREATININE 2.55* 1.81* 1.36*  CALCIUM 9.0 8.7* 8.4*  GLUCOSE 145* 167* 138*   Diet Order:  DIET SOFT Room service appropriate? Yes; Fluid consistency: Thin  Skin:  DTI to sacrum, R foot, L foot   Last BM:  4/20  Height:  Ht Readings from Last 1  Encounters:  12/24/16 '6\' 2"'  (1.88 m)   Weight:  Wt Readings from Last 1 Encounters:  12/26/16 137 lb 12.6 oz (62.5 kg)   Wt Readings from Last 10 Encounters:  12/26/16 137 lb 12.6 oz (62.5 kg)  12/15/16 160 lb (72.6 kg)  12/08/16 160 lb (72.6 kg)  09/22/16 159 lb (72.1 kg)  09/15/16 159 lb (72.1 kg)  09/03/16 159 lb (72.1 kg)  08/23/16 159 lb (72.1 kg)  08/20/16 159 lb 14.4 oz (72.5 kg)  03/24/16 155 lb (70.3 kg)  01/23/16 148 lb 2.7 oz (67.2 kg)   Ideal Body Weight:  86.36 kg  BMI:  Body mass index is 17.69 kg/m.  Estimated Nutritional Needs:  Kcal:  2100-2300 (34-37 kcals/kg  bw) Protein:  88-100 g Pro (1.4-1.6 g/kg bw) Fluid:  Per MD  EDUCATION NEEDS:  No education needs identified at this time  Burtis Junes RD, LDN, [facility] Pager: [phone] 12/26/2016 2:14 PM

## 2016-12-27 DIAGNOSIS — E43 Unspecified severe protein-calorie malnutrition: Secondary | ICD-10-CM | POA: Insufficient documentation

## 2016-12-27 LAB — BASIC METABOLIC PANEL
Anion gap: 8 (ref 5–15)
BUN: 65 mg/dL — ABNORMAL HIGH (ref 6–20)
CALCIUM: 8.6 mg/dL — AB (ref 8.9–10.3)
CO2: 24 mmol/L (ref 22–32)
CREATININE: 1.23 mg/dL (ref 0.61–1.24)
Chloride: 108 mmol/L (ref 101–111)
GFR calc non Af Amer: 49 mL/min — ABNORMAL LOW (ref >60)
GFR, EST AFRICAN AMERICAN: 57 mL/min — AB (ref >60)
GLUCOSE: 122 mg/dL — AB (ref 65–99)
Potassium: 3.6 mmol/L (ref 3.5–5.1)
Sodium: 140 mmol/L (ref 135–145)

## 2016-12-27 LAB — CBC WITH DIFFERENTIAL/PLATELET
BASOS PCT: 0 %
Basophils Absolute: 0 10*3/uL (ref 0.0–0.1)
EOS PCT: 0 %
Eosinophils Absolute: 0 10*3/uL (ref 0.0–0.7)
HEMATOCRIT: 35.4 % — AB (ref 39.0–52.0)
Hemoglobin: 12.1 g/dL — ABNORMAL LOW (ref 13.0–17.0)
Lymphocytes Relative: 6 %
Lymphs Abs: 1 10*3/uL (ref 0.7–4.0)
MCH: 34.3 pg — ABNORMAL HIGH (ref 26.0–34.0)
MCHC: 34.2 g/dL (ref 30.0–36.0)
MCV: 100.3 fL — AB (ref 78.0–100.0)
MONO ABS: 0.9 10*3/uL (ref 0.1–1.0)
MONOS PCT: 5 %
NEUTROS ABS: 15.2 10*3/uL — AB (ref 1.7–7.7)
Neutrophils Relative %: 89 %
Platelets: 233 10*3/uL (ref 150–400)
RBC: 3.53 MIL/uL — ABNORMAL LOW (ref 4.22–5.81)
RDW: 13.6 % (ref 11.5–15.5)
WBC: 17.1 10*3/uL — ABNORMAL HIGH (ref 4.0–10.5)

## 2016-12-27 MED ORDER — FAMOTIDINE 20 MG PO TABS
20.0000 mg | ORAL_TABLET | Freq: Every day | ORAL | Status: DC
  Administered 2016-12-27 – 2016-12-31 (×5): 20 mg via ORAL
  Filled 2016-12-27 (×5): qty 1

## 2016-12-27 MED ORDER — FLUCONAZOLE 100 MG PO TABS
100.0000 mg | ORAL_TABLET | Freq: Every day | ORAL | Status: DC
  Administered 2016-12-28 – 2017-01-01 (×5): 100 mg via ORAL
  Filled 2016-12-27 (×5): qty 1

## 2016-12-27 NOTE — Progress Notes (Signed)
Patient ID: Andre Holder, male   DOB: 06-20-1923, 83 y.o.   MRN: [id number]                                                               PROGRESS NOTE                                                                                                                                                                                                             Patient Demographics:    Andre Holder, is a 83 y.o. male, DOB - 12-27-1922, PYK:[id number]  Admit date - 12/24/2016   Admitting Physician Reubin Milan, MD  Outpatient Primary MD for the patient is Asencion Noble, MD  LOS - 3  Outpatient Specialists:    Chief Complaint  Patient presents with  . Altered Mental Status  . Fall       Brief Narrative     Subjective:    Andre Holder today is afebrile.  Pt just had a nice bm,  No headache, No chest pain, No abdominal pain - No Nausea, No new weakness tingling or numbness, No Cough - SOB.    Assessment  & Plan :    Principal Problem:   Hypernatremia Active Problems:   Hypothyroidism   Chronic systolic congestive heart failure, NYHA class 2 (HCC)   Anemia   Pressure injury of skin   UTI (urinary tract infection)   Protein-calorie malnutrition, severe   #1. Hypernatremia.  Improving with hydration #2. Acute kidney injury/marked dehydration.  Improving, KVO fluid #3 Dysphagia Speech therapy to evaluate #4. Chronic systolic heart failure. No recurrence thus far with rehydration. Continue holding diuretic therapy. #5. UTI. Blood and urine cultures pending. Continue ceftriaxone. #6. Hypokalemia. Bmp in am #7. Status post pacemaker placement. Stable.    Code Status : DNR  Family Communication  :   Disposition Plan  : ? SNF vs home with hospice  Barriers For Discharge :   Consults  :  speech  Procedures  :   DVT Prophylaxis  :  Heparin -    Lab Results  Component Value Date   PLT 233 12/27/2016    Antibiotics  :  rocephine, fluconazole  Anti-infectives     Start     Dose/Rate Route Frequency Ordered Stop   12/28/16 1000  fluconazole (DIFLUCAN) tablet 100 mg     100 mg Oral Daily 12/27/16 1449     12/25/16 1700  fluconazole (DIFLUCAN) IVPB 100 mg     100 mg 50 mL/hr over 60 Minutes Intravenous Every 24 hours 12/24/16 1923 12/27/16 1800   12/25/16 1600  cefTRIAXone (ROCEPHIN) 1 g in dextrose 5 % 50 mL IVPB     1 g 100 mL/hr over 30 Minutes Intravenous Every 24 hours 12/24/16 2010     12/24/16 2000  cefTRIAXone (ROCEPHIN) 1 g in dextrose 5 % 50 mL IVPB  Status:  Discontinued     1 g 100 mL/hr over 30 Minutes Intravenous Every 24 hours 12/24/16 1923 12/24/16 2010   12/24/16 1630  fluconazole (DIFLUCAN) IVPB 100 mg  Status:  Discontinued     100 mg 50 mL/hr over 60 Minutes Intravenous Every 24 hours 12/24/16 1601 12/24/16 1623   12/24/16 1630  fluconazole (DIFLUCAN) IVPB 100 mg  Status:  Discontinued     100 mg 50 mL/hr over 60 Minutes Intravenous Every 24 hours 12/24/16 1623 12/24/16 1923   12/24/16 1600  cefTRIAXone (ROCEPHIN) 1 g in dextrose 5 % 50 mL IVPB  Status:  Discontinued     1 g 100 mL/hr over 30 Minutes Intravenous  Once 12/24/16 1554 12/24/16 1938        Objective:   Vitals:   12/27/16 1000 12/27/16 1155 12/27/16 1200 12/27/16 1526  BP: 96/64 (!) 86/58 92/60 (!) 135/99  Pulse: 68 91 95 85  Resp: 16 18  19   Temp:  100.2 F (37.9 C)  97.6 F (36.4 C)  TempSrc:  Axillary  Oral  SpO2: 98%  91% 96%  Weight:      Height:        Wt Readings from Last 3 Encounters:  12/27/16 63.2 kg (139 lb 5.3 oz)  12/15/16 72.6 kg (160 lb)  12/08/16 72.6 kg (160 lb)     Intake/Output Summary (Last 24 hours) at 12/27/16 1946 Last data filed at 12/27/16 1524  Gross per 24 hour  Intake             1662 ml  Output              250 ml  Net             1412 ml     Physical Exam  Awake Alert, No new F.N deficits, Normal affect [place].AT,PERRAL Supple Neck,No JVD, No cervical lymphadenopathy appriciated.  Symmetrical Chest wall  movement, Good air movement bilaterally, CTAB RRR,No Gallops,Rubs or new Murmurs, No Parasternal Heave +ve B.Sounds, Abd Soft, No tenderness, No organomegaly appriciated, No rebound - guarding or rigidity. No Cyanosis, Clubbing or edema, No new Rash or bruise      Data Review:    CBC  Recent Labs Lab 12/24/16 1453 12/25/16 0448 12/26/16 0410 12/27/16 0648  WBC 18.3* 12.8* 12.8* 17.1*  HGB 13.8 12.1* 11.8* 12.1*  HCT 41.3 37.2* 35.1* 35.4*  PLT 275 216 187 233  MCV 102.0* 103.0* 101.7* 100.3*  MCH 34.1* 33.5 34.2* 34.3*  MCHC 33.4 32.5 33.6 34.2  RDW 14.0 13.9 13.7 13.6  LYMPHSABS 1.4 0.9 0.8 1.0  MONOABS 1.4* 1.0 1.0 0.9  EOSABS 0.0 0.0 0.0 0.0  BASOSABS 0.0 0.0 0.0 0.0    Chemistries   Recent Labs Lab 12/24/16 1453 12/25/16 0448 12/26/16 0410 12/27/16 0648  NA 151* 150* 144 140  K 5.0 3.8 3.4* 3.6  CL 114* 115* 112* 108  CO2 26 24 23 24   GLUCOSE 145* 167* 138* 122*  BUN 129* 122* 90* 65*  CREATININE 2.55* 1.81* 1.36* 1.23  CALCIUM 9.0 8.7* 8.4* 8.6*  AST 56*  --   --   --   ALT 26  --   --   --   ALKPHOS 98  --   --   --   BILITOT 0.9  --   --   --    ------------------------------------------------------------------------------------------------------------------ No results for input(s): CHOL, HDL, LDLCALC, TRIG, CHOLHDL, LDLDIRECT in the last 72 hours.  No results found for: HGBA1C ------------------------------------------------------------------------------------------------------------------ No results for input(s): TSH, T4TOTAL, T3FREE, THYROIDAB in the last 72 hours.  Invalid input(s): FREET3 ------------------------------------------------------------------------------------------------------------------ No results for input(s): VITAMINB12, FOLATE, FERRITIN, TIBC, IRON, RETICCTPCT in the last 72 hours.  Coagulation profile No results for input(s): INR, PROTIME in the last 168 hours.  No results for input(s): DDIMER in the last 72  hours.  Cardiac Enzymes No results for input(s): CKMB, TROPONINI, MYOGLOBIN in the last 168 hours.  Invalid input(s): CK ------------------------------------------------------------------------------------------------------------------    Component Value Date/Time   BNP 1,635.0 (H) 08/18/2016 2317    Inpatient Medications  Scheduled Meds: . aspirin EC  81 mg Oral Daily  . chlorhexidine  15 mL Mouth Rinse BID  . Chlorhexidine Gluconate Cloth  6 each Topical Q0600  . famotidine  20 mg Oral q1800  . finasteride  5 mg Oral QHS  . [START ON 12/28/2016] fluconazole  100 mg Oral Daily  . heparin  5,000 Units Subcutaneous Q8H  . levothyroxine  200 mcg Oral QAC breakfast  . mouth rinse  15 mL Mouth Rinse q12n4p  . mupirocin ointment  1 application Nasal BID  . pantoprazole  40 mg Oral Daily  . tamsulosin  0.4 mg Oral BID   Continuous Infusions: . cefTRIAXone (ROCEPHIN)  IV Stopped (12/27/16 1729)  . dextrose 30 mL/hr at 12/27/16 0600   PRN Meds:.acetaminophen, guaiFENesin, ondansetron **OR** ondansetron (ZOFRAN) IV  Micro Results Recent Results (from the past 240 hour(s))  Culture, Urine     Status: None   Collection Time: 12/18/16 11:35 AM  Result Value Ref Range Status   Specimen Description URINE, RANDOM  Final   Special Requests NONE  Final   Culture   Final    NO GROWTH Performed at [facility], [address], [place], [place] [address]    Report Status 12/20/2016 FINAL  Final  Blood Culture (routine x 2)     Status: None (Preliminary result)   Collection Time: 12/24/16  2:53 PM  Result Value Ref Range Status   Specimen Description BLOOD RIGHT HAND  Final   Special Requests   Final    BOTTLES DRAWN AEROBIC ONLY Blood Culture adequate volume   Culture NO GROWTH 3 DAYS  Final   Report Status PENDING  Incomplete  Blood Culture (routine x 2)     Status: None (Preliminary result)   Collection Time: 12/24/16  2:59 PM  Result Value Ref Range Status   Specimen  Description BLOOD LEFT HAND  Final   Special Requests   Final    BOTTLES DRAWN AEROBIC AND ANAEROBIC Blood Culture adequate volume   Culture NO GROWTH 3 DAYS  Final   Report Status PENDING  Incomplete  MRSA PCR Screening     Status: Abnormal   Collection Time: 12/24/16  8:37 PM  Result Value Ref Range Status   MRSA by PCR POSITIVE (A) NEGATIVE Final    Comment:  The GeneXpert MRSA Assay (FDA approved for NASAL specimens only), is one component of a comprehensive MRSA colonization surveillance program. It is not intended to diagnose MRSA infection nor to guide or monitor treatment for MRSA infections. RESULT CALLED TO, READ BACK BY AND VERIFIED WITH:  SMITH,F @ 0008 ON 12/25/16 BY JUW     Radiology Reports Dg Chest 1 View  Result Date: 12/24/2016 CLINICAL DATA:  83 year old male with altered mental status for 9 days after a fall. Initial encounter. EXAM: CHEST 1 VIEW COMPARISON:  12/15/2016 and earlier. FINDINGS: AP semi upright view of the chest at 1417 hours. Chronic elevation of the left hemidiaphragm is stable since 2017. Stable cardiac size and mediastinal contours. Left chest stool lead cardiac pacemaker. Calcified aortic atherosclerosis. Visualized tracheal air column is within normal limits. Chronic left lung base hypo ventilation appears stable. No pneumothorax, pulmonary edema, pleural effusion or acute pulmonary opacity. Chronic levoconvex scoliosis at the thoracolumbar junction. No acute osseous abnormality identified. Negative visible bowel gas pattern. IMPRESSION: 1.  No acute cardiopulmonary abnormality. 2. Calcified aortic atherosclerosis. Electronically Signed   By: Genevie Ann M.D.   On: 12/24/2016 14:33   Dg Chest 1 View  Result Date: 12/15/2016 CLINICAL DATA:  Status post fall. Found on floor. Concern for chest injury. Initial encounter. EXAM: CHEST 1 VIEW COMPARISON:  Chest radiograph performed 12/08/2016 FINDINGS: The lungs are well-aerated. Mild bibasilar  opacities may reflect atelectasis or mild infection. There is chronic elevation of the left hemidiaphragm. There is no evidence of pleural effusion or pneumothorax. The cardiomediastinal silhouette is mildly enlarged. A pacemaker is noted overlying the left chest wall, with leads ending overlying the right atrium and right ventricle. No acute osseous abnormalities are seen. IMPRESSION: Mild bibasilar opacities may reflect atelectasis or mild infection. Chronic elevation of the left hemidiaphragm. Mild cardiomegaly. Electronically Signed   By: Garald Balding M.D.   On: 12/15/2016 02:44   Dg Chest 2 View  Result Date: 12/08/2016 CLINICAL DATA:  Hypotensive, confused EXAM: CHEST  2 VIEW COMPARISON:  08/18/2016 FINDINGS: There is chronic elevation of the left diaphragm. There is bilateral diffuse mild interstitial thickening. There is no focal parenchymal opacity. There is no pleural effusion or pneumothorax. There is stable cardiomegaly. There is a dual lead cardiac pacemaker. The osseous structures are unremarkable. IMPRESSION: Cardiomegaly with pulmonary vascular congestion. Electronically Signed   By: Kathreen Devoid   On: 12/08/2016 16:57   Ct Head Wo Contrast  Result Date: 12/24/2016 CLINICAL DATA:  Status post fall.  Altered mental status ever since. EXAM: CT HEAD WITHOUT CONTRAST TECHNIQUE: Contiguous axial images were obtained from the base of the skull through the vertex without intravenous contrast. COMPARISON:  12/15/2016 FINDINGS: Brain: No evidence of acute infarction, hemorrhage, extra-axial collection, ventriculomegaly, or mass effect. Punctate calcifications throughout the right cerebral hemisphere likely reflecting sequela of prior infectious or inflammatory process. Generalized cerebral atrophy. Periventricular white matter low attenuation likely secondary to microangiopathy. Vascular: Cerebrovascular atherosclerotic calcifications are noted. Skull: Negative for fracture or focal lesion.  Sinuses/Orbits: Visualized portions of the orbits are unremarkable. Visualized portions of the paranasal sinuses and mastoid air cells are unremarkable. Other: None. IMPRESSION: 1. No acute intracranial pathology. 2. Chronic microvascular disease and cerebral atrophy. Electronically Signed   By: Kathreen Devoid   On: 12/24/2016 15:08   Ct Head Wo Contrast  Result Date: 12/15/2016 CLINICAL DATA:  83 y/o M; found on floor with laceration to top of head. EXAM: CT HEAD WITHOUT CONTRAST CT CERVICAL SPINE  WITHOUT CONTRAST TECHNIQUE: Multidetector CT imaging of the head and cervical spine was performed following the standard protocol without intravenous contrast. Multiplanar CT image reconstructions of the cervical spine were also generated. COMPARISON:  05/15/2016 CT cervical spine.  09/02/2014 CT head FINDINGS: CT HEAD FINDINGS Brain: Small chronic right posterior frontal cortical infarction. Advanced chronic microvascular ischemic changes and parenchymal volume loss of the brain. Numerous stable scattered calcifications in sulci throughout the supratentorial brain probably representing sequelae of prior infectious or inflammatory process. No evidence of large acute infarct, focal mass effect, intracranial hemorrhage, or hydrocephalus. Vascular: Extensive calcific atherosclerosis of cavernous and paraclinoid internal carotid arteries. Skull: Scalp laceration at the vertex. No displaced calvarial fracture. Sinuses/Orbits: Moderate diffuse paranasal sinus mucosal thickening. Normally aerated mastoid air cells. Bilateral intra-ocular lens replacements. Other: None. CT CERVICAL SPINE FINDINGS Alignment: Stable straightening of cervical lordosis. Moderate cervical levocurvature. Skull base and vertebrae: No acute fracture. No primary bone lesion or focal pathologic process. Soft tissues and spinal canal: No prevertebral fluid or swelling. No visible canal hematoma. Disc levels: Advanced cervical spondylosis with severe disc  and facet degenerative changes stable from prior CT of the cervical spine. C2-3 large central disc protrusion with anterior cord impingement and multifactorial moderate C4-5 canal stenosis. Upper chest: Negative. Other: Extensive calcific atherosclerosis of the carotid bifurcations and common carotid arteries. IMPRESSION: 1. No acute intracranial abnormality identified. 2. Scalp laceration at the vertex.  No displaced calvarial fracture. 3. Stable advanced chronic microvascular ischemic changes and parenchymal volume loss of the brain. 4. No acute fracture or dislocation of the cervical spine. 5. Stable advanced cervical spondylosis. Stable C3-4 central disc protrusion with anterior cord impingement and moderate multifactorial C4-5 canal stenosis. Electronically Signed   By: Kristine Garbe M.D.   On: 12/15/2016 03:05   Ct Cervical Spine Wo Contrast  Result Date: 12/15/2016 CLINICAL DATA:  83 y/o M; found on floor with laceration to top of head. EXAM: CT HEAD WITHOUT CONTRAST CT CERVICAL SPINE WITHOUT CONTRAST TECHNIQUE: Multidetector CT imaging of the head and cervical spine was performed following the standard protocol without intravenous contrast. Multiplanar CT image reconstructions of the cervical spine were also generated. COMPARISON:  05/15/2016 CT cervical spine.  09/02/2014 CT head FINDINGS: CT HEAD FINDINGS Brain: Small chronic right posterior frontal cortical infarction. Advanced chronic microvascular ischemic changes and parenchymal volume loss of the brain. Numerous stable scattered calcifications in sulci throughout the supratentorial brain probably representing sequelae of prior infectious or inflammatory process. No evidence of large acute infarct, focal mass effect, intracranial hemorrhage, or hydrocephalus. Vascular: Extensive calcific atherosclerosis of cavernous and paraclinoid internal carotid arteries. Skull: Scalp laceration at the vertex. No displaced calvarial fracture.  Sinuses/Orbits: Moderate diffuse paranasal sinus mucosal thickening. Normally aerated mastoid air cells. Bilateral intra-ocular lens replacements. Other: None. CT CERVICAL SPINE FINDINGS Alignment: Stable straightening of cervical lordosis. Moderate cervical levocurvature. Skull base and vertebrae: No acute fracture. No primary bone lesion or focal pathologic process. Soft tissues and spinal canal: No prevertebral fluid or swelling. No visible canal hematoma. Disc levels: Advanced cervical spondylosis with severe disc and facet degenerative changes stable from prior CT of the cervical spine. C2-3 large central disc protrusion with anterior cord impingement and multifactorial moderate C4-5 canal stenosis. Upper chest: Negative. Other: Extensive calcific atherosclerosis of the carotid bifurcations and common carotid arteries. IMPRESSION: 1. No acute intracranial abnormality identified. 2. Scalp laceration at the vertex.  No displaced calvarial fracture. 3. Stable advanced chronic microvascular ischemic changes and parenchymal volume loss of  the brain. 4. No acute fracture or dislocation of the cervical spine. 5. Stable advanced cervical spondylosis. Stable C3-4 central disc protrusion with anterior cord impingement and moderate multifactorial C4-5 canal stenosis. Electronically Signed   By: Kristine Garbe M.D.   On: 12/15/2016 03:05   Dg Hips Bilat W Or Wo Pelvis 3-4 Views  Result Date: 12/15/2016 CLINICAL DATA:  Found on floor. Concern for bilateral hip injury. Initial encounter. EXAM: DG HIP (WITH OR WITHOUT PELVIS) 3-4V BILAT COMPARISON:  Bilateral hip radiographs performed 04/09/2015 FINDINGS: There is no evidence of fracture or dislocation. Both femoral heads are seated normally within their respective acetabula. The proximal femurs appear intact. Mild degenerative change is noted at the lower lumbar spine. The sacroiliac joints are unremarkable in appearance. The visualized bowel gas pattern is  grossly unremarkable in appearance. Scattered vascular calcifications are seen. IMPRESSION: 1. No evidence of fracture or dislocation. 2. Scattered vascular calcifications seen. Electronically Signed   By: Garald Balding M.D.   On: 12/15/2016 02:42    Time Spent in minutes  30   Jani Gravel M.D on 12/27/2016 at 7:46 PM  Between 7am to 7am- Pager - [phone]

## 2016-12-27 NOTE — Progress Notes (Signed)
The patient is receiving PEPCID by the intravenous route.  Based on criteria approved by the Pharmacy and [place], the medication is being converted to the equivalent oral dose form.  These criteria include: -No Active GI bleeding -Able to tolerate diet of full liquids (or better) or tube feeding OR able to tolerate other medications by the oral or enteral route  If you have any questions about this conversion, please contact the Pharmacy Department (ext [phone]).  Thank you.  Ena Dawley, [facility] 12/27/2016 2:22 PM

## 2016-12-27 NOTE — Progress Notes (Signed)
Patient resting in bed with eyes closed. Vital signs are stable. IV patent. Condom cath intact. Report called to Lane, Therapist, sports. Patient transferred to room 316 via bed with nursing staff and RT.

## 2016-12-27 NOTE — Plan of Care (Signed)
Problem: Skin Integrity: Goal: Risk for impaired skin integrity will decrease Outcome: Not Progressing PT ADMITTED W/ NUMERUS BEDSORES AND SKIN ISUSES

## 2016-12-28 ENCOUNTER — Inpatient Hospital Stay (HOSPITAL_COMMUNITY): Payer: Medicare Other

## 2016-12-28 LAB — COMPREHENSIVE METABOLIC PANEL
ALK PHOS: 113 U/L (ref 38–126)
ALT: 28 U/L (ref 17–63)
ANION GAP: 11 (ref 5–15)
AST: 36 U/L (ref 15–41)
Albumin: 2.4 g/dL — ABNORMAL LOW (ref 3.5–5.0)
BUN: 57 mg/dL — ABNORMAL HIGH (ref 6–20)
CALCIUM: 8.9 mg/dL (ref 8.9–10.3)
CO2: 24 mmol/L (ref 22–32)
CREATININE: 1.17 mg/dL (ref 0.61–1.24)
Chloride: 109 mmol/L (ref 101–111)
GFR calc non Af Amer: 53 mL/min — ABNORMAL LOW (ref >60)
Glucose, Bld: 122 mg/dL — ABNORMAL HIGH (ref 65–99)
Potassium: 3.7 mmol/L (ref 3.5–5.1)
SODIUM: 144 mmol/L (ref 135–145)
TOTAL PROTEIN: 6.4 g/dL — AB (ref 6.5–8.1)
Total Bilirubin: 0.5 mg/dL (ref 0.3–1.2)

## 2016-12-28 LAB — CBC
HCT: 34.5 % — ABNORMAL LOW (ref 39.0–52.0)
HEMOGLOBIN: 11.8 g/dL — AB (ref 13.0–17.0)
MCH: 34.1 pg — AB (ref 26.0–34.0)
MCHC: 34.2 g/dL (ref 30.0–36.0)
MCV: 99.7 fL (ref 78.0–100.0)
Platelets: 233 10*3/uL (ref 150–400)
RBC: 3.46 MIL/uL — ABNORMAL LOW (ref 4.22–5.81)
RDW: 13.5 % (ref 11.5–15.5)
WBC: 17.5 10*3/uL — ABNORMAL HIGH (ref 4.0–10.5)

## 2016-12-28 MED ORDER — SODIUM CHLORIDE 0.9 % IV SOLN
INTRAVENOUS | Status: AC
  Administered 2016-12-28: 10:00:00 via INTRAVENOUS

## 2016-12-28 NOTE — Progress Notes (Signed)
Patient ID: Andre Holder, male   DOB: 1922/10/01, 83 y.o.   MRN: [id number]                                                                PROGRESS NOTE                                                                                                                                                                                                             Patient Demographics:    Andre Holder, is a 83 y.o. male, DOB - 08/21/1923, [id number]  Admit date - 12/24/2016   Admitting Physician Reubin Milan, MD  Outpatient Primary MD for the patient is Asencion Noble, MD  LOS - 4  Outpatient Specialists:     Chief Complaint  Patient presents with  . Altered Mental Status  . Fall       Brief Narrative  83 y.o. male with medical history significant of adrenal hyperplasia, anemia, Nonocclusive CAD, mild aortic stenosis, history of Mobitz type II AV block, history of pacemaker placement, peripheral vascular disease, borderline hypertension, hyperlipidemia, carotid stenosis, DJD, GERD, hypothyroidism who was brought to the emergency department for progressively worse altered mental status in the past 9 days, following a fall. The patient is unable to provide history at this time.  Per patient's daughter, the patient normally is able to communicate freely, is fully oriented,  uses a motorized scooter, goes shopping with her management is usually aware of his surroundings. His wife usually helps him a home, but given her diabetes and other health issues she has been unable to help him with daily care and activities lately. Patient was taken to a hospice house where he had a fall, was seen in the ER on 12/15/2016. Per records, prior to arrival to the ED that date, the patient received Haldol and Ativan at the hospice house. He also has been getting scopolamine. Since then, the patient's mental status has been progressively declining, with decrease oral intake and worsening physical status. He has  developed decubiti ulcers.  ED Course: The patient received 2000 and ML openness bolus, 1 g of Rocephin and 100 mg of Diflucan IVPB. His urine analysis showed pyuria 06-30 WBC per hpf. WBC was 18.3, hemoglobin 13.8 g/dL and platelets 278. Sodium level CLI, potassium 5.0, chloride  114, bicarbonate 26 mmol/L. His lactic acid initially was 2.97, but after IV fluids and antibiotics is 1.37 mmol/L.  Imaging: Chest radiograph and CT of the head did not show any acute abnormalities.    Subjective:    Andre Holder today has slight cough, yellow sputum.  Pt is afebrile.  Has mild leukocytosis.   No headache, No chest pain, No abdominal pain - No Nausea, No new weakness tingling or numbness, No - SOB.    Assessment  & Plan :    Principal Problem:   Hypernatremia Active Problems:   Hypothyroidism   Chronic systolic congestive heart failure, NYHA class 2 (HCC)   Anemia   Pressure injury of skin   UTI (urinary tract infection)   Protein-calorie malnutrition, severe     #1 Leukocytosis unclear source CXR today Repeat cbc in am #2. Hypernatremia resolved Cont ns at [facility] #3. Acute kidney injury/marked dehydration.  Cont ns at [facility] #4 Dysphagia ? Speech therapy to evaluate #5. Chronic systolic heart failure. No recurrence thus far with rehydration. Continue holding diuretic therapy. #6. UTI. Blood and urine cultures ngtd,  Continue ceftriaxone. #7. Hypokalemia resolved #8. Status post pacemaker placement. Stable.      Code Status : DNR  Family Communication  : no family in room  Disposition Plan  : home w hospice  Barriers For Discharge :   Consults  :    Procedures  :   DVT Prophylaxis  :  Lovenox - Heparin - SCDs   Lab Results  Component Value Date   PLT 233 12/28/2016    Antibiotics  :  Diflucan 4/19=4/21, Rocephin  4/18=>   Anti-infectives    Start     Dose/Rate Route Frequency Ordered Stop   12/28/16 1000  fluconazole (DIFLUCAN) tablet 100 mg     100 mg  Oral Daily 12/27/16 1449     12/25/16 1700  fluconazole (DIFLUCAN) IVPB 100 mg     100 mg 50 mL/hr over 60 Minutes Intravenous Every 24 hours 12/24/16 1923 12/27/16 1800   12/25/16 1600  cefTRIAXone (ROCEPHIN) 1 g in dextrose 5 % 50 mL IVPB     1 g 100 mL/hr over 30 Minutes Intravenous Every 24 hours 12/24/16 2010     12/24/16 2000  cefTRIAXone (ROCEPHIN) 1 g in dextrose 5 % 50 mL IVPB  Status:  Discontinued     1 g 100 mL/hr over 30 Minutes Intravenous Every 24 hours 12/24/16 1923 12/24/16 2010   12/24/16 1630  fluconazole (DIFLUCAN) IVPB 100 mg  Status:  Discontinued     100 mg 50 mL/hr over 60 Minutes Intravenous Every 24 hours 12/24/16 1601 12/24/16 1623   12/24/16 1630  fluconazole (DIFLUCAN) IVPB 100 mg  Status:  Discontinued     100 mg 50 mL/hr over 60 Minutes Intravenous Every 24 hours 12/24/16 1623 12/24/16 1923   12/24/16 1600  cefTRIAXone (ROCEPHIN) 1 g in dextrose 5 % 50 mL IVPB  Status:  Discontinued     1 g 100 mL/hr over 30 Minutes Intravenous  Once 12/24/16 1554 12/24/16 1938        Objective:   Vitals:   12/27/16 1200 12/27/16 1526 12/27/16 2003 12/28/16 0545  BP: 92/60 (!) 135/99  117/82  Pulse: 95 85  92  Resp:  19  18  Temp:  97.6 F (36.4 C)  97.6 F (36.4 C)  TempSrc:  Oral  Tympanic  SpO2: 91% 96% 98%   Weight:      Height:  Wt Readings from Last 3 Encounters:  12/27/16 63.2 kg (139 lb 5.3 oz)  12/15/16 72.6 kg (160 lb)  12/08/16 72.6 kg (160 lb)     Intake/Output Summary (Last 24 hours) at 12/28/16 0935 Last data filed at 12/28/16 0900  Gross per 24 hour  Intake            847.5 ml  Output                0 ml  Net            847.5 ml     Physical Exam  Awake Alert, Oriented X 3, No new F.N deficits, Normal affect [place].AT,PERRAL,  Supple Neck,No JVD, No cervical lymphadenopathy appriciated. Evidence of prior tracheostomy  Symmetrical Chest wall movement, Good air movement bilaterally, CTAB RRR,No Gallops,Rubs or new Murmurs, No  Parasternal Heave +ve B.Sounds, Abd Soft, No tenderness, No organomegaly appriciated, No rebound - guarding or rigidity. No Cyanosis, Clubbing or edema, No new Rash or bruise   Bilateral heels in protectors Evidence of prior tka scar Unable to see skin on back  Condom cath in place, urine clear yellow     Data Review:    CBC  Recent Labs Lab 12/24/16 1453 12/25/16 0448 12/26/16 0410 12/27/16 0648 12/28/16 0606  WBC 18.3* 12.8* 12.8* 17.1* 17.5*  HGB 13.8 12.1* 11.8* 12.1* 11.8*  HCT 41.3 37.2* 35.1* 35.4* 34.5*  PLT 275 216 187 233 233  MCV 102.0* 103.0* 101.7* 100.3* 99.7  MCH 34.1* 33.5 34.2* 34.3* 34.1*  MCHC 33.4 32.5 33.6 34.2 34.2  RDW 14.0 13.9 13.7 13.6 13.5  LYMPHSABS 1.4 0.9 0.8 1.0  --   MONOABS 1.4* 1.0 1.0 0.9  --   EOSABS 0.0 0.0 0.0 0.0  --   BASOSABS 0.0 0.0 0.0 0.0  --     Chemistries   Recent Labs Lab 12/24/16 1453 12/25/16 0448 12/26/16 0410 12/27/16 0648 12/28/16 0606  NA 151* 150* 144 140 144  K 5.0 3.8 3.4* 3.6 3.7  CL 114* 115* 112* 108 109  CO2 26 24 23 24 24   GLUCOSE 145* 167* 138* 122* 122*  BUN 129* 122* 90* 65* 57*  CREATININE 2.55* 1.81* 1.36* 1.23 1.17  CALCIUM 9.0 8.7* 8.4* 8.6* 8.9  AST 56*  --   --   --  36  ALT 26  --   --   --  28  ALKPHOS 98  --   --   --  113  BILITOT 0.9  --   --   --  0.5   ------------------------------------------------------------------------------------------------------------------ No results for input(s): CHOL, HDL, LDLCALC, TRIG, CHOLHDL, LDLDIRECT in the last 72 hours.  No results found for: HGBA1C ------------------------------------------------------------------------------------------------------------------ No results for input(s): TSH, T4TOTAL, T3FREE, THYROIDAB in the last 72 hours.  Invalid input(s): FREET3 ------------------------------------------------------------------------------------------------------------------ No results for input(s): VITAMINB12, FOLATE, FERRITIN, TIBC,  IRON, RETICCTPCT in the last 72 hours.  Coagulation profile No results for input(s): INR, PROTIME in the last 168 hours.  No results for input(s): DDIMER in the last 72 hours.  Cardiac Enzymes No results for input(s): CKMB, TROPONINI, MYOGLOBIN in the last 168 hours.  Invalid input(s): CK ------------------------------------------------------------------------------------------------------------------    Component Value Date/Time   BNP 1,635.0 (H) 08/18/2016 2317    Inpatient Medications  Scheduled Meds: . aspirin EC  81 mg Oral Daily  . chlorhexidine  15 mL Mouth Rinse BID  . Chlorhexidine Gluconate Cloth  6 each Topical Q0600  . famotidine  20 mg Oral  [id number]  . finasteride  5 mg Oral QHS  . fluconazole  100 mg Oral Daily  . heparin  5,000 Units Subcutaneous Q8H  . levothyroxine  200 mcg Oral QAC breakfast  . mouth rinse  15 mL Mouth Rinse q12n4p  . mupirocin ointment  1 application Nasal BID  . pantoprazole  40 mg Oral Daily  . tamsulosin  0.4 mg Oral BID   Continuous Infusions: . sodium chloride    . cefTRIAXone (ROCEPHIN)  IV Stopped (12/27/16 1729)   PRN Meds:.acetaminophen, guaiFENesin, ondansetron **OR** ondansetron (ZOFRAN) IV  Micro Results Recent Results (from the past 240 hour(s))  Culture, Urine     Status: None   Collection Time: 12/18/16 11:35 AM  Result Value Ref Range Status   Specimen Description URINE, RANDOM  Final   Special Requests NONE  Final   Culture   Final    NO GROWTH Performed at [facility], [place] [address], [place], [place] [address]    Report Status 12/20/2016 FINAL  Final  Blood Culture (routine x 2)     Status: None (Preliminary result)   Collection Time: 12/24/16  2:53 PM  Result Value Ref Range Status   Specimen Description BLOOD RIGHT HAND  Final   Special Requests   Final    BOTTLES DRAWN AEROBIC ONLY Blood Culture adequate volume   Culture NO GROWTH 4 DAYS  Final   Report Status PENDING  Incomplete  Blood Culture  (routine x 2)     Status: None (Preliminary result)   Collection Time: 12/24/16  2:59 PM  Result Value Ref Range Status   Specimen Description BLOOD LEFT HAND  Final   Special Requests   Final    BOTTLES DRAWN AEROBIC AND ANAEROBIC Blood Culture adequate volume   Culture NO GROWTH 4 DAYS  Final   Report Status PENDING  Incomplete  MRSA PCR Screening     Status: Abnormal   Collection Time: 12/24/16  8:37 PM  Result Value Ref Range Status   MRSA by PCR POSITIVE (A) NEGATIVE Final    Comment:        The GeneXpert MRSA Assay (FDA approved for NASAL specimens only), is one component of a comprehensive MRSA colonization surveillance program. It is not intended to diagnose MRSA infection nor to guide or monitor treatment for MRSA infections. RESULT CALLED TO, READ BACK BY AND VERIFIED WITH:  SMITH,F @ 0008 ON 12/25/16 BY JUW     Radiology Reports Dg Chest 1 View  Result Date: 12/24/2016 CLINICAL DATA:  83 year old male with altered mental status for 9 days after a fall. Initial encounter. EXAM: CHEST 1 VIEW COMPARISON:  12/15/2016 and earlier. FINDINGS: AP semi upright view of the chest at 1417 hours. Chronic elevation of the left hemidiaphragm is stable since 2017. Stable cardiac size and mediastinal contours. Left chest stool lead cardiac pacemaker. Calcified aortic atherosclerosis. Visualized tracheal air column is within normal limits. Chronic left lung base hypo ventilation appears stable. No pneumothorax, pulmonary edema, pleural effusion or acute pulmonary opacity. Chronic levoconvex scoliosis at the thoracolumbar junction. No acute osseous abnormality identified. Negative visible bowel gas pattern. IMPRESSION: 1.  No acute cardiopulmonary abnormality. 2. Calcified aortic atherosclerosis. Electronically Signed   By: Genevie Ann M.D.   On: 12/24/2016 14:33   Dg Chest 1 View  Result Date: 12/15/2016 CLINICAL DATA:  Status post fall. Found on floor. Concern for chest injury. Initial  encounter. EXAM: CHEST 1 VIEW COMPARISON:  Chest radiograph performed 12/08/2016 FINDINGS:  The lungs are well-aerated. Mild bibasilar opacities may reflect atelectasis or mild infection. There is chronic elevation of the left hemidiaphragm. There is no evidence of pleural effusion or pneumothorax. The cardiomediastinal silhouette is mildly enlarged. A pacemaker is noted overlying the left chest wall, with leads ending overlying the right atrium and right ventricle. No acute osseous abnormalities are seen. IMPRESSION: Mild bibasilar opacities may reflect atelectasis or mild infection. Chronic elevation of the left hemidiaphragm. Mild cardiomegaly. Electronically Signed   By: Garald Balding M.D.   On: 12/15/2016 02:44   Dg Chest 2 View  Result Date: 12/08/2016 CLINICAL DATA:  Hypotensive, confused EXAM: CHEST  2 VIEW COMPARISON:  08/18/2016 FINDINGS: There is chronic elevation of the left diaphragm. There is bilateral diffuse mild interstitial thickening. There is no focal parenchymal opacity. There is no pleural effusion or pneumothorax. There is stable cardiomegaly. There is a dual lead cardiac pacemaker. The osseous structures are unremarkable. IMPRESSION: Cardiomegaly with pulmonary vascular congestion. Electronically Signed   By: Kathreen Devoid   On: 12/08/2016 16:57   Ct Head Wo Contrast  Result Date: 12/24/2016 CLINICAL DATA:  Status post fall.  Altered mental status ever since. EXAM: CT HEAD WITHOUT CONTRAST TECHNIQUE: Contiguous axial images were obtained from the base of the skull through the vertex without intravenous contrast. COMPARISON:  12/15/2016 FINDINGS: Brain: No evidence of acute infarction, hemorrhage, extra-axial collection, ventriculomegaly, or mass effect. Punctate calcifications throughout the right cerebral hemisphere likely reflecting sequela of prior infectious or inflammatory process. Generalized cerebral atrophy. Periventricular white matter low attenuation likely secondary to  microangiopathy. Vascular: Cerebrovascular atherosclerotic calcifications are noted. Skull: Negative for fracture or focal lesion. Sinuses/Orbits: Visualized portions of the orbits are unremarkable. Visualized portions of the paranasal sinuses and mastoid air cells are unremarkable. Other: None. IMPRESSION: 1. No acute intracranial pathology. 2. Chronic microvascular disease and cerebral atrophy. Electronically Signed   By: Kathreen Devoid   On: 12/24/2016 15:08   Ct Head Wo Contrast  Result Date: 12/15/2016 CLINICAL DATA:  83 y/o M; found on floor with laceration to top of head. EXAM: CT HEAD WITHOUT CONTRAST CT CERVICAL SPINE WITHOUT CONTRAST TECHNIQUE: Multidetector CT imaging of the head and cervical spine was performed following the standard protocol without intravenous contrast. Multiplanar CT image reconstructions of the cervical spine were also generated. COMPARISON:  05/15/2016 CT cervical spine.  09/02/2014 CT head FINDINGS: CT HEAD FINDINGS Brain: Small chronic right posterior frontal cortical infarction. Advanced chronic microvascular ischemic changes and parenchymal volume loss of the brain. Numerous stable scattered calcifications in sulci throughout the supratentorial brain probably representing sequelae of prior infectious or inflammatory process. No evidence of large acute infarct, focal mass effect, intracranial hemorrhage, or hydrocephalus. Vascular: Extensive calcific atherosclerosis of cavernous and paraclinoid internal carotid arteries. Skull: Scalp laceration at the vertex. No displaced calvarial fracture. Sinuses/Orbits: Moderate diffuse paranasal sinus mucosal thickening. Normally aerated mastoid air cells. Bilateral intra-ocular lens replacements. Other: None. CT CERVICAL SPINE FINDINGS Alignment: Stable straightening of cervical lordosis. Moderate cervical levocurvature. Skull base and vertebrae: No acute fracture. No primary bone lesion or focal pathologic process. Soft tissues and spinal  canal: No prevertebral fluid or swelling. No visible canal hematoma. Disc levels: Advanced cervical spondylosis with severe disc and facet degenerative changes stable from prior CT of the cervical spine. C2-3 large central disc protrusion with anterior cord impingement and multifactorial moderate C4-5 canal stenosis. Upper chest: Negative. Other: Extensive calcific atherosclerosis of the carotid bifurcations and common carotid arteries. IMPRESSION: 1. No acute intracranial  abnormality identified. 2. Scalp laceration at the vertex.  No displaced calvarial fracture. 3. Stable advanced chronic microvascular ischemic changes and parenchymal volume loss of the brain. 4. No acute fracture or dislocation of the cervical spine. 5. Stable advanced cervical spondylosis. Stable C3-4 central disc protrusion with anterior cord impingement and moderate multifactorial C4-5 canal stenosis. Electronically Signed   By: Kristine Garbe M.D.   On: 12/15/2016 03:05   Ct Cervical Spine Wo Contrast  Result Date: 12/15/2016 CLINICAL DATA:  83 y/o M; found on floor with laceration to top of head. EXAM: CT HEAD WITHOUT CONTRAST CT CERVICAL SPINE WITHOUT CONTRAST TECHNIQUE: Multidetector CT imaging of the head and cervical spine was performed following the standard protocol without intravenous contrast. Multiplanar CT image reconstructions of the cervical spine were also generated. COMPARISON:  05/15/2016 CT cervical spine.  09/02/2014 CT head FINDINGS: CT HEAD FINDINGS Brain: Small chronic right posterior frontal cortical infarction. Advanced chronic microvascular ischemic changes and parenchymal volume loss of the brain. Numerous stable scattered calcifications in sulci throughout the supratentorial brain probably representing sequelae of prior infectious or inflammatory process. No evidence of large acute infarct, focal mass effect, intracranial hemorrhage, or hydrocephalus. Vascular: Extensive calcific atherosclerosis of  cavernous and paraclinoid internal carotid arteries. Skull: Scalp laceration at the vertex. No displaced calvarial fracture. Sinuses/Orbits: Moderate diffuse paranasal sinus mucosal thickening. Normally aerated mastoid air cells. Bilateral intra-ocular lens replacements. Other: None. CT CERVICAL SPINE FINDINGS Alignment: Stable straightening of cervical lordosis. Moderate cervical levocurvature. Skull base and vertebrae: No acute fracture. No primary bone lesion or focal pathologic process. Soft tissues and spinal canal: No prevertebral fluid or swelling. No visible canal hematoma. Disc levels: Advanced cervical spondylosis with severe disc and facet degenerative changes stable from prior CT of the cervical spine. C2-3 large central disc protrusion with anterior cord impingement and multifactorial moderate C4-5 canal stenosis. Upper chest: Negative. Other: Extensive calcific atherosclerosis of the carotid bifurcations and common carotid arteries. IMPRESSION: 1. No acute intracranial abnormality identified. 2. Scalp laceration at the vertex.  No displaced calvarial fracture. 3. Stable advanced chronic microvascular ischemic changes and parenchymal volume loss of the brain. 4. No acute fracture or dislocation of the cervical spine. 5. Stable advanced cervical spondylosis. Stable C3-4 central disc protrusion with anterior cord impingement and moderate multifactorial C4-5 canal stenosis. Electronically Signed   By: Kristine Garbe M.D.   On: 12/15/2016 03:05   Dg Hips Bilat W Or Wo Pelvis 3-4 Views  Result Date: 12/15/2016 CLINICAL DATA:  Found on floor. Concern for bilateral hip injury. Initial encounter. EXAM: DG HIP (WITH OR WITHOUT PELVIS) 3-4V BILAT COMPARISON:  Bilateral hip radiographs performed 04/09/2015 FINDINGS: There is no evidence of fracture or dislocation. Both femoral heads are seated normally within their respective acetabula. The proximal femurs appear intact. Mild degenerative change is  noted at the lower lumbar spine. The sacroiliac joints are unremarkable in appearance. The visualized bowel gas pattern is grossly unremarkable in appearance. Scattered vascular calcifications are seen. IMPRESSION: 1. No evidence of fracture or dislocation. 2. Scattered vascular calcifications seen. Electronically Signed   By: Garald Balding M.D.   On: 12/15/2016 02:42    Time Spent in minutes  30   Jani Gravel M.D on 12/28/2016 at 9:35 AM  Between 7am to 7am - Pager - [phone]

## 2016-12-29 LAB — COMPREHENSIVE METABOLIC PANEL
ALBUMIN: 2.2 g/dL — AB (ref 3.5–5.0)
ALK PHOS: 107 U/L (ref 38–126)
ALT: 26 U/L (ref 17–63)
ANION GAP: 8 (ref 5–15)
AST: 35 U/L (ref 15–41)
BILIRUBIN TOTAL: 0.4 mg/dL (ref 0.3–1.2)
BUN: 48 mg/dL — AB (ref 6–20)
CALCIUM: 8.4 mg/dL — AB (ref 8.9–10.3)
CO2: 25 mmol/L (ref 22–32)
CREATININE: 0.97 mg/dL (ref 0.61–1.24)
Chloride: 107 mmol/L (ref 101–111)
GFR calc Af Amer: >60 mL/min (ref >60)
GFR calc non Af Amer: >60 mL/min (ref >60)
GLUCOSE: 117 mg/dL — AB (ref 65–99)
Potassium: 3.5 mmol/L (ref 3.5–5.1)
Sodium: 140 mmol/L (ref 135–145)
TOTAL PROTEIN: 6 g/dL — AB (ref 6.5–8.1)

## 2016-12-29 LAB — CBC
HEMATOCRIT: 33.5 % — AB (ref 39.0–52.0)
Hemoglobin: 11.4 g/dL — ABNORMAL LOW (ref 13.0–17.0)
MCH: 33.9 pg (ref 26.0–34.0)
MCHC: 34 g/dL (ref 30.0–36.0)
MCV: 99.7 fL (ref 78.0–100.0)
Platelets: 246 10*3/uL (ref 150–400)
RBC: 3.36 MIL/uL — ABNORMAL LOW (ref 4.22–5.81)
RDW: 13.6 % (ref 11.5–15.5)
WBC: 14.3 10*3/uL — ABNORMAL HIGH (ref 4.0–10.5)

## 2016-12-29 LAB — CULTURE, BLOOD (ROUTINE X 2)
CULTURE: NO GROWTH
CULTURE: NO GROWTH
SPECIAL REQUESTS: ADEQUATE
Special Requests: ADEQUATE

## 2016-12-29 MED ORDER — POTASSIUM CHLORIDE CRYS ER 20 MEQ PO TBCR
20.0000 meq | EXTENDED_RELEASE_TABLET | Freq: Three times a day (TID) | ORAL | Status: AC
  Administered 2016-12-29 – 2016-12-30 (×6): 20 meq via ORAL
  Filled 2016-12-29 (×6): qty 1

## 2016-12-29 NOTE — Progress Notes (Signed)
6 beat run V-tach.  MD notified

## 2016-12-29 NOTE — Evaluation (Signed)
Clinical/Bedside Swallow Evaluation Patient Details  Name: Andre Holder MRN: [id number] Date of Birth: 1922-11-05  Today's Date: 12/29/2016 Time: SLP Start Time (ACUTE ONLY): 1700 SLP Stop Time (ACUTE ONLY): 1740 SLP Time Calculation (min) (ACUTE ONLY): 40 min  Past Medical History:  Past Medical History:  Diagnosis Date  . Adrenal hyperplasia ([place])    Stable on serial imaging  . Anemia    minimal in 2011 with hemoglobin of 12.2 and high normal MCV  . Arteriosclerotic cardiovascular disease (ASCVD)    Nonobstructive; 09/2008 50% proximal and 40% mid LAD; 25% circumflex; 30% RCA; mild global LV dysfunction with EF of 45%. No aortic stenosis.  . Borderline hypertension    Normal CMet in 2011  . Cancer of larynx ([place])    laryngectomy in 1988; postoperative radiation therapy  . Carotid stenosis   . 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 Degenerative joint disease    s/p bilateral TKR  . GERD (gastroesophageal reflux disease)   . Hyperlipidemia    Lipid profile in 04/2010:115, 98, 43, 52.  Marland Kitchen Hypothyroidism   . Mild aortic stenosis    not documented at catheterization; verified by echo in 2011  . Mitral regurgitation   . Mobitz (type) II atrioventricular block    With bradycardia; [employer] pacemaker implanted in 09/2008  . Peripheral vascular disease ([place])    With a 70% innominate artery stenosis and nonobstructive carotid stenosis  . Skin cancer   . Small bowel obstruction ([place])   . Tobacco abuse, in remission    Remote  . Weight loss    50 pounds between 1991 and 2011   Past Surgical History:  Past Surgical History:  Procedure Laterality Date  . APPENDECTOMY  1973  . CATARACT EXTRACTION, BILATERAL    . DECOMPRESSION FACIAL NERVE     Right median  . INSERT / REPLACE / REMOVE PACEMAKER    . KNEE ARTHROSCOPY     Left  . LARYNGECTOMY  1988   S/P laryngectomy and radiation therapy  . PACEMAKER INSERTION    . TOTAL KNEE  ARTHROPLASTY     Bilateral, 19 years ago   HPI:  83 y.o.malewith medical history significant of adrenal hyperplasia, anemia, Nonocclusive CAD,mild aortic stenosis, history of Mobitz type II AV block, history of pacemaker placement, peripheral vascular disease,borderline hypertension, hyperlipidemia, carotid stenosis, DJD, GERD, hypothyroidism who was brought to the emergency department for progressively worse altered mental status in the past 9 days, following a fall. The patient is unable to provide history at this time. Per patient's daughter, the patient normally is able to communicate freely, is fully oriented, uses amotorized scooter, goes shopping with her management is usually aware of his surroundings. His wife usually helps him a home, but given her diabetes and other health issues she has been unable to help him with daily care and activities lately. Patient was taken to a hospice house where he had a fall, was seen in the ER on 12/15/2016. Per records, prior to arrival to the ED that date, the patient received Haldol and Ativan at the hospice house. He also has been getting scopolamine. Since then, the patient's mental status has been progressively declining, with decrease oral intake and worsening physical status. He has developed decubiti ulcers. BSE ordered.   Assessment / Plan / Recommendation Clinical Impression  Pt seen at bedside for clinical swallow evaluation and a niece was present. Pt repositioned to upright with assist  from RN and Pt given his electrolarynx for communication, however it was difficult to understand him despite cues to decrease rate.  Pt demonstrated left buccal pocketing with mechanical soft textures and benefited from liquid wash and verbal cues to mobilize residue with his tongue. Pt needed to expectorate his green beans. Recommend D1/puree diet with ground meats until Pt closer to baseline.  SLP Visit Diagnosis: Dysphagia, oropharyngeal phase (R13.12)     Aspiration Risk   (Pt with laryngectomy)    Diet Recommendation Dysphagia 1 (Puree);Thin liquid (ground meats)   Liquid Administration via: Cup;Straw Medication Administration: Whole meds with puree (or crushed in puree) Supervision: Staff to assist with self feeding;Full supervision/cueing for compensatory strategies Compensations: Slow rate;Multiple dry swallows after each bite/sip;Lingual sweep for clearance of pocketing;Follow solids with liquid Postural Changes: Seated upright at 90 degrees;Remain upright for at least 30 minutes after po intake    Other  Recommendations Oral Care Recommendations: Oral care BID;Staff/trained caregiver to provide oral care Other Recommendations: Clarify dietary restrictions   Follow up Recommendations None      Frequency and Duration min 2x/week  1 week       Prognosis Prognosis for Safe Diet Advancement: Good Barriers to Reach Goals: Behavior      Swallow Study   General Date of Onset: 12/24/16 HPI: 83 y.o.malewith medical history significant of adrenal hyperplasia, anemia, Nonocclusive CAD,mild aortic stenosis, history of Mobitz type II AV block, history of pacemaker placement, peripheral vascular disease,borderline hypertension, hyperlipidemia, carotid stenosis, DJD, GERD, hypothyroidism who was brought to the emergency department for progressively worse altered mental status in the past 9 days, following a fall. The patient is unable to provide history at this time. Per patient's daughter, the patient normally is able to communicate freely, is fully oriented, uses amotorized scooter, goes shopping with her management is usually aware of his surroundings. His wife usually helps him a home, but given her diabetes and other health issues she has been unable to help him with daily care and activities lately. Patient was taken to a hospice house where he had a fall, was seen in the ER on 12/15/2016. Per records, prior to arrival to the ED that  date, the patient received Haldol and Ativan at the hospice house. He also has been getting scopolamine. Since then, the patient's mental status has been progressively declining, with decrease oral intake and worsening physical status. He has developed decubiti ulcers. BSE ordered. Type of Study: Bedside Swallow Evaluation Previous Swallow Assessment: none on record Diet Prior to this Study: Dysphagia 3 (soft);Thin liquids Temperature Spikes Noted: No Respiratory Status: Trach Collar History of Recent Intubation: No Behavior/Cognition: Alert;Pleasant mood;Cooperative Oral Cavity Assessment: Within Functional Limits Oral Care Completed by SLP: Recent completion by staff Oral Cavity - Dentition: Edentulous (Pt has 2 lower teeth and reportedly has dentures) Vision: Functional for self-feeding Self-Feeding Abilities: Needs assist;Needs set up Patient Positioning: Upright in bed Baseline Vocal Quality:  (Pt has total laryngectomy) Volitional Cough: Other (Comment) Volitional Swallow: Able to elicit    Oral/Motor/Sensory Function Overall Oral Motor/Sensory Function: Within functional limits   Ice Chips Ice chips: Not tested   Thin Liquid Thin Liquid: Within functional limits Presentation: Straw    Nectar Thick Nectar Thick Liquid: Not tested   Honey Thick Honey Thick Liquid: Not tested   Puree Puree: Within functional limits Presentation: Spoon   Solid      Solid: Impaired Presentation: Spoon Oral Phase Impairments: Reduced lingual movement/coordination;Impaired mastication Oral Phase Functional Implications: Impaired mastication;Oral  residue;Left lateral sulci pocketing       Thank you,  Genene Churn, [place] 12/29/2016,5:44 PM

## 2016-12-29 NOTE — Progress Notes (Signed)
Subjective: Awake and alert. Denies pain.  Objective: Vital signs in last 24 hours: Vitals:   12/28/16 0610 12/28/16 1700 12/28/16 2241 12/29/16 0644  BP: 116/75  104/60 (!) (P) 117/59  Pulse: 99  71 (!) (P) 105  Resp:   18 (P) 18  Temp: 97.8 F (36.6 C)  97.5 F (36.4 C) (P) 97.6 F (36.4 C)  TempSrc: Oral  Oral (P) Axillary  SpO2: 99% 99% 98%   Weight:      Height:       Weight change:   Intake/Output Summary (Last 24 hours) at 12/29/16 0711 Last data filed at 12/29/16 1308  Gross per 24 hour  Intake              601 ml  Output                0 ml  Net              601 ml    Physical Exam: Pharynx more moist now. Lungs clear. Heart regular with a grade 3 systolic murmur. Abdomen flat, soft and nontender. Extremities reveal no edema.  Lab Results:    Results for orders placed or performed during the hospital encounter of 12/24/16 (from the past 24 hour(s))  CBC     Status: Abnormal   Collection Time: 12/29/16  6:23 AM  Result Value Ref Range   WBC 14.3 (H) 4.0 - 10.5 K/uL   RBC 3.36 (L) 4.22 - 5.81 MIL/uL   Hemoglobin 11.4 (L) 13.0 - 17.0 g/dL   HCT 33.5 (L) 39.0 - 52.0 %   MCV 99.7 78.0 - 100.0 fL   MCH 33.9 26.0 - 34.0 pg   MCHC 34.0 30.0 - 36.0 g/dL   RDW 13.6 11.5 - 15.5 %   Platelets 246 150 - 400 K/uL  Comprehensive metabolic panel     Status: Abnormal   Collection Time: 12/29/16  6:23 AM  Result Value Ref Range   Sodium 140 135 - 145 mmol/L   Potassium 3.5 3.5 - 5.1 mmol/L   Chloride 107 101 - 111 mmol/L   CO2 25 22 - 32 mmol/L   Glucose, Bld 117 (H) 65 - 99 mg/dL   BUN 48 (H) 6 - 20 mg/dL   Creatinine, Ser 0.97 0.61 - 1.24 mg/dL   Calcium 8.4 (L) 8.9 - 10.3 mg/dL   Total Protein 6.0 (L) 6.5 - 8.1 g/dL   Albumin 2.2 (L) 3.5 - 5.0 g/dL   AST 35 15 - 41 U/L   ALT 26 17 - 63 U/L   Alkaline Phosphatase 107 38 - 126 U/L   Total Bilirubin 0.4 0.3 - 1.2 mg/dL   GFR calc non Af Amer >60 >60 mL/min   GFR calc Af Amer >60 >60 mL/min   Anion gap 8 5 -  15     ABGS No results for input(s): PHART, PO2ART, TCO2, HCO3 in the last 72 hours.  Invalid input(s): PCO2 CULTURES Recent Results (from the past 240 hour(s))  Blood Culture (routine x 2)     Status: None (Preliminary result)   Collection Time: 12/24/16  2:53 PM  Result Value Ref Range Status   Specimen Description BLOOD RIGHT HAND  Final   Special Requests   Final    BOTTLES DRAWN AEROBIC ONLY Blood Culture adequate volume   Culture NO GROWTH 4 DAYS  Final   Report Status PENDING  Incomplete  Blood Culture (routine x 2)     Status:  None (Preliminary result)   Collection Time: 12/24/16  2:59 PM  Result Value Ref Range Status   Specimen Description BLOOD LEFT HAND  Final   Special Requests   Final    BOTTLES DRAWN AEROBIC AND ANAEROBIC Blood Culture adequate volume   Culture NO GROWTH 4 DAYS  Final   Report Status PENDING  Incomplete  MRSA PCR Screening     Status: Abnormal   Collection Time: 12/24/16  8:37 PM  Result Value Ref Range Status   MRSA by PCR POSITIVE (A) NEGATIVE Final    Comment:        The GeneXpert MRSA Assay (FDA approved for NASAL specimens only), is one component of a comprehensive MRSA colonization surveillance program. It is not intended to diagnose MRSA infection nor to guide or monitor treatment for MRSA infections. RESULT CALLED TO, READ BACK BY AND VERIFIED WITH:  SMITH,F @ 0008 ON 12/25/16 BY JUW    Studies/Results: Dg Chest Port 1 View  Result Date: 12/28/2016 CLINICAL DATA:  Leukocytosis. EXAM: PORTABLE CHEST 1 VIEW COMPARISON:  12/24/2016 FINDINGS: The cardiomediastinal silhouette is unchanged. Aortic atherosclerosis and a left subclavian approach pacemaker are again noted. There is chronic elevation of the left hemidiaphragm, and the lungs are overall less well inflated than on the prior study. There are mildly increased interstitial densities in both lung bases, left greater than right. No consolidative opacities, pulmonary edema, sizable  pleural effusion, or pneumothorax is identified. IMPRESSION: Hypoinflation with mildly increased opacity in the left greater than right lung bases, likely atelectasis though early infection is not excluded on the left. Electronically Signed   By: Logan Bores M.D.   On: 12/28/2016 10:11   Micro Results: Recent Results (from the past 240 hour(s))  Blood Culture (routine x 2)     Status: None (Preliminary result)   Collection Time: 12/24/16  2:53 PM  Result Value Ref Range Status   Specimen Description BLOOD RIGHT HAND  Final   Special Requests   Final    BOTTLES DRAWN AEROBIC ONLY Blood Culture adequate volume   Culture NO GROWTH 4 DAYS  Final   Report Status PENDING  Incomplete  Blood Culture (routine x 2)     Status: None (Preliminary result)   Collection Time: 12/24/16  2:59 PM  Result Value Ref Range Status   Specimen Description BLOOD LEFT HAND  Final   Special Requests   Final    BOTTLES DRAWN AEROBIC AND ANAEROBIC Blood Culture adequate volume   Culture NO GROWTH 4 DAYS  Final   Report Status PENDING  Incomplete  MRSA PCR Screening     Status: Abnormal   Collection Time: 12/24/16  8:37 PM  Result Value Ref Range Status   MRSA by PCR POSITIVE (A) NEGATIVE Final    Comment:        The GeneXpert MRSA Assay (FDA approved for NASAL specimens only), is one component of a comprehensive MRSA colonization surveillance program. It is not intended to diagnose MRSA infection nor to guide or monitor treatment for MRSA infections. RESULT CALLED TO, READ BACK BY AND VERIFIED WITH:  SMITH,F @ 0008 ON 12/25/16 BY JUW    Studies/Results: Dg Chest Port 1 View  Result Date: 12/28/2016 CLINICAL DATA:  Leukocytosis. EXAM: PORTABLE CHEST 1 VIEW COMPARISON:  12/24/2016 FINDINGS: The cardiomediastinal silhouette is unchanged. Aortic atherosclerosis and a left subclavian approach pacemaker are again noted. There is chronic elevation of the left hemidiaphragm, and the lungs are overall less well  inflated than  on the prior study. There are mildly increased interstitial densities in both lung bases, left greater than right. No consolidative opacities, pulmonary edema, sizable pleural effusion, or pneumothorax is identified. IMPRESSION: Hypoinflation with mildly increased opacity in the left greater than right lung bases, likely atelectasis though early infection is not excluded on the left. Electronically Signed   By: Logan Bores M.D.   On: 12/28/2016 10:11   Medications:  I have reviewed the patient's current medications Scheduled Meds: . aspirin EC  81 mg Oral Daily  . chlorhexidine  15 mL Mouth Rinse BID  . famotidine  20 mg Oral q1800  . finasteride  5 mg Oral QHS  . fluconazole  100 mg Oral Daily  . heparin  5,000 Units Subcutaneous Q8H  . levothyroxine  200 mcg Oral QAC breakfast  . mouth rinse  15 mL Mouth Rinse q12n4p  . mupirocin ointment  1 application Nasal BID  . pantoprazole  40 mg Oral Daily  . tamsulosin  0.4 mg Oral BID   Continuous Infusions: . sodium chloride 30 mL/hr at 12/28/16 1012  . cefTRIAXone (ROCEPHIN)  IV 1 g (12/28/16 1719)   PRN Meds:.acetaminophen, guaiFENesin, ondansetron **OR** ondansetron (ZOFRAN) IV   Assessment/Plan: #1. Hypernatremia. Sodium now 140. #2. Acute kidney injury/dehydration. Much improved. BUN and creatinine are down to 48 and 0.97. #3. Severe protein calorie malnutrition. Continue nutritional supplementation. Up in chair for meals. #4. UTI. White count improved to 14.3. Blood cultures are negative. It appears no urine culture was obtained on admission. Continue ceftriaxone. #5. Chronic systolic heart failure. Continue holding diuretic therapy at this point.  Consult physical therapy. Principal Problem:   Hypernatremia Active Problems:   Hypothyroidism   Chronic systolic congestive heart failure, NYHA class 2 (HCC)   Anemia   Pressure injury of skin   UTI (urinary tract infection)   Protein-calorie malnutrition,  severe     LOS: 5 days   Andre Holder 12/29/2016, 7:11 AM

## 2016-12-29 NOTE — Progress Notes (Signed)
Patient is confused tried checking pulse ox no result. Stoma suctioned around yellow sputum.

## 2016-12-30 LAB — BASIC METABOLIC PANEL
Anion gap: 8 (ref 5–15)
BUN: 33 mg/dL — AB (ref 6–20)
CHLORIDE: 108 mmol/L (ref 101–111)
CO2: 22 mmol/L (ref 22–32)
Calcium: 8.5 mg/dL — ABNORMAL LOW (ref 8.9–10.3)
Creatinine, Ser: 0.85 mg/dL (ref 0.61–1.24)
GFR calc Af Amer: >60 mL/min (ref >60)
GFR calc non Af Amer: >60 mL/min (ref >60)
GLUCOSE: 105 mg/dL — AB (ref 65–99)
POTASSIUM: 4.2 mmol/L (ref 3.5–5.1)
Sodium: 138 mmol/L (ref 135–145)

## 2016-12-30 LAB — MAGNESIUM: Magnesium: 2.2 mg/dL (ref 1.7–2.4)

## 2016-12-30 NOTE — Progress Notes (Signed)
Subjective: Awake and alert. Breathing comfortably. Vital signs normal. Oxygen saturation 99%.  Objective: Vital signs in last 24 hours: Vitals:   12/29/16 0733 12/29/16 1320 12/29/16 2100 12/30/16 0500  BP:  122/63 125/75 120/67  Pulse: 66 91 91 88  Resp: 19 20 20 20   Temp:  97.8 F (36.6 C) 98.3 F (36.8 C) 98.5 F (36.9 C)  TempSrc:  Oral Oral Oral  SpO2: 99% (!) 85% 99% 99%  Weight:      Height:       Weight change:   Intake/Output Summary (Last 24 hours) at 12/30/16 0703 Last data filed at 12/29/16 1700  Gross per 24 hour  Intake            615.5 ml  Output                0 ml  Net            615.5 ml    Physical Exam: Pharynx moist. Trach stable. Head wound appears to be healing well with staples in place. Lungs clear. Heart regular with a grade 3 systolic murmur. Abdomen flat and nontender. Wounds dressed.  Lab Results:   No results found for this or any previous visit (from the past 24 hour(s)).   ABGS No results for input(s): PHART, PO2ART, TCO2, HCO3 in the last 72 hours.  Invalid input(s): PCO2 CULTURES Recent Results (from the past 240 hour(s))  Blood Culture (routine x 2)     Status: None   Collection Time: 12/24/16  2:53 PM  Result Value Ref Range Status   Specimen Description BLOOD RIGHT HAND  Final   Special Requests   Final    BOTTLES DRAWN AEROBIC ONLY Blood Culture adequate volume   Culture NO GROWTH 5 DAYS  Final   Report Status 12/29/2016 FINAL  Final  Blood Culture (routine x 2)     Status: None   Collection Time: 12/24/16  2:59 PM  Result Value Ref Range Status   Specimen Description BLOOD LEFT HAND  Final   Special Requests   Final    BOTTLES DRAWN AEROBIC AND ANAEROBIC Blood Culture adequate volume   Culture NO GROWTH 5 DAYS  Final   Report Status 12/29/2016 FINAL  Final  MRSA PCR Screening     Status: Abnormal   Collection Time: 12/24/16  8:37 PM  Result Value Ref Range Status   MRSA by PCR POSITIVE (A) NEGATIVE Final   Comment:        The GeneXpert MRSA Assay (FDA approved for NASAL specimens only), is one component of a comprehensive MRSA colonization surveillance program. It is not intended to diagnose MRSA infection nor to guide or monitor treatment for MRSA infections. RESULT CALLED TO, READ BACK BY AND VERIFIED WITH:  SMITH,F @ 0008 ON 12/25/16 BY JUW    Studies/Results: Dg Chest Port 1 View  Result Date: 12/28/2016 CLINICAL DATA:  Leukocytosis. EXAM: PORTABLE CHEST 1 VIEW COMPARISON:  12/24/2016 FINDINGS: The cardiomediastinal silhouette is unchanged. Aortic atherosclerosis and a left subclavian approach pacemaker are again noted. There is chronic elevation of the left hemidiaphragm, and the lungs are overall less well inflated than on the prior study. There are mildly increased interstitial densities in both lung bases, left greater than right. No consolidative opacities, pulmonary edema, sizable pleural effusion, or pneumothorax is identified. IMPRESSION: Hypoinflation with mildly increased opacity in the left greater than right lung bases, likely atelectasis though early infection is not excluded on the left. Electronically Signed  By: Logan Bores M.D.   On: 12/28/2016 10:11   Micro Results: Recent Results (from the past 240 hour(s))  Blood Culture (routine x 2)     Status: None   Collection Time: 12/24/16  2:53 PM  Result Value Ref Range Status   Specimen Description BLOOD RIGHT HAND  Final   Special Requests   Final    BOTTLES DRAWN AEROBIC ONLY Blood Culture adequate volume   Culture NO GROWTH 5 DAYS  Final   Report Status 12/29/2016 FINAL  Final  Blood Culture (routine x 2)     Status: None   Collection Time: 12/24/16  2:59 PM  Result Value Ref Range Status   Specimen Description BLOOD LEFT HAND  Final   Special Requests   Final    BOTTLES DRAWN AEROBIC AND ANAEROBIC Blood Culture adequate volume   Culture NO GROWTH 5 DAYS  Final   Report Status 12/29/2016 FINAL  Final  MRSA PCR  Screening     Status: Abnormal   Collection Time: 12/24/16  8:37 PM  Result Value Ref Range Status   MRSA by PCR POSITIVE (A) NEGATIVE Final    Comment:        The GeneXpert MRSA Assay (FDA approved for NASAL specimens only), is one component of a comprehensive MRSA colonization surveillance program. It is not intended to diagnose MRSA infection nor to guide or monitor treatment for MRSA infections. RESULT CALLED TO, READ BACK BY AND VERIFIED WITH:  SMITH,F @ 0008 ON 12/25/16 BY JUW    Studies/Results: Dg Chest Port 1 View  Result Date: 12/28/2016 CLINICAL DATA:  Leukocytosis. EXAM: PORTABLE CHEST 1 VIEW COMPARISON:  12/24/2016 FINDINGS: The cardiomediastinal silhouette is unchanged. Aortic atherosclerosis and a left subclavian approach pacemaker are again noted. There is chronic elevation of the left hemidiaphragm, and the lungs are overall less well inflated than on the prior study. There are mildly increased interstitial densities in both lung bases, left greater than right. No consolidative opacities, pulmonary edema, sizable pleural effusion, or pneumothorax is identified. IMPRESSION: Hypoinflation with mildly increased opacity in the left greater than right lung bases, likely atelectasis though early infection is not excluded on the left. Electronically Signed   By: Logan Bores M.D.   On: 12/28/2016 10:11   Medications:  I have reviewed the patient's current medications Scheduled Meds: . aspirin EC  81 mg Oral Daily  . chlorhexidine  15 mL Mouth Rinse BID  . famotidine  20 mg Oral q1800  . finasteride  5 mg Oral QHS  . fluconazole  100 mg Oral Daily  . heparin  5,000 Units Subcutaneous Q8H  . levothyroxine  200 mcg Oral QAC breakfast  . mouth rinse  15 mL Mouth Rinse q12n4p  . pantoprazole  40 mg Oral Daily  . potassium chloride  20 mEq Oral TID  . tamsulosin  0.4 mg Oral BID   Continuous Infusions: . cefTRIAXone (ROCEPHIN)  IV Stopped (12/29/16 1725)   PRN  Meds:.acetaminophen, guaiFENesin, ondansetron **OR** ondansetron (ZOFRAN) IV   Assessment/Plan: #1. Acute kidney injury/dehydration. Speech therapy consultation noted. Continue pured diet. Creatinine back to normal. #2. Chronic systolic heart failure. Will restart Lasix in the near future. #3. UTI. Continue ceftriaxone.  Blood cultures are negative so far. #4. Severe protein calorie malnutrition. Will need SNF following discharge for rehabilitation. Discussed his status with his wife yesterday. Principal Problem:   Hypernatremia Active Problems:   Hypothyroidism   Chronic systolic congestive heart failure, NYHA class 2 (HCC)  Anemia   Pressure injury of skin   UTI (urinary tract infection)   Protein-calorie malnutrition, severe     LOS: 6 days   Andre Holder 12/30/2016, 7:03 AM

## 2016-12-30 NOTE — Care Management Note (Signed)
Case Management Note  Patient Details  Name: Andre Holder MRN: [id number] Date of Birth: 03-01-1923    If discussed at Long Length of Stay Meetings, dates discussed:  12/30/2016 Additional Comments:   Jacoby Ritsema, Chauncey [place], RN 12/30/2016, 10:23 AM

## 2016-12-30 NOTE — Clinical Social Work Note (Signed)
Clinical Social Work Assessment  Patient Details  Name: Andre Holder MRN: [id number] Date of Birth: 01-Sep-1923  Date of referral:  12/30/16               Reason for consult:  Discharge Planning                Permission sought to share information with:    Permission granted to share information::     Name::        Agency::     Relationship::     Contact Information:     Housing/Transportation Living arrangements for the past 2 months:  Single Family Home Source of Information:  Adult Children, Spouse Patient Interpreter Needed:  None Criminal Activity/Legal Involvement Pertinent to Current Situation/Hospitalization:  No - Comment as needed Significant Relationships:  Adult Children, Spouse Lives with:  Spouse Do you feel safe going back to the place where you live?  Yes Need for family participation in patient care:  Yes (Comment)  Care giving concerns:  None identified at baseline.    Social Worker assessment / plan:  At baseline patient lives at home with wife, ambulated with rolling walker and was able to go out shopping in a scooter. He suffered a fall while in [facility]osp[facility].  Patient's family is agreeable to SNF. Advised that their choices are [facility] and [facility].   Employment status:  Retired Nurse, adult PT Recommendations:  [place] / Referral to community resources:  [place]  Patient/Family's Response to care:  Agreeable to SNF.   Patient/Family's Understanding of and Emotional Response to Diagnosis, Current Treatment, and Prognosis: Family understands patient's diagnosis, treatment and prognosis.    Emotional Assessment Appearance:  Appears stated age Attitude/Demeanor/Rapport:  Unable to Assess Affect (typically observed):  Unable to Assess Orientation:  Oriented to Self Alcohol / Substance use:  Not Applicable Psych involvement (Current and /or in the community):  No  (Comment)  Discharge Needs  Concerns to be addressed:  Discharge Planning Concerns Readmission within the last 30 days:  No Current discharge risk:  Chronically ill Barriers to Discharge:  No Barriers Identified   Ihor Gully, LCSW 12/30/2016, 1:56 PM

## 2016-12-30 NOTE — Progress Notes (Signed)
  Speech Language Pathology Treatment: Dysphagia  Patient Details Name: Andre Holder MRN: [id number] DOB: 09/14/1922 Today's Date: 12/30/2016 Time: 0910-0930 SLP Time Calculation (min) (ACUTE ONLY): 20 min  Assessment / Plan / Recommendation Clinical Impression  Pt had just finished breakfast meal (was fed by his wife) upon SLP arrival. Pt's wife was not present for evaluation yesterday so SLP reviewed visit from yesterday. Wife confirms that Pt's diet prior to respite at [facility] was regular and thin, however was changed to puree with onset of oral thrush. Oral cavity currently appears clear and moist, however Pt does grimace at times with swallow. Mrs. Channing agrees that Pt is most appropriate for puree textures with ground meats at this time. SLP inquired as to laryngectomy care prior to admission and wife stated that Pt had a "tube he is supposed to wear, but doesn't". I suspect this is a Larytube and I asked her to bring it in so that it could be placed. SLP provided education on importance of using tube to help keep the stoma patent as this is his only source for respiration. His wife said that she would bring this in. Continue diet as ordered. Will alert Respiratory Therapy about Larytube.    HPI HPI: 83 y.o.malewith medical history significant of adrenal hyperplasia, anemia, Nonocclusive CAD,mild aortic stenosis, history of Mobitz type II AV block, history of pacemaker placement, peripheral vascular disease,borderline hypertension, hyperlipidemia, carotid stenosis, DJD, GERD, hypothyroidism who was brought to the emergency department for progressively worse altered mental status in the past 9 days, following a fall. The patient is unable to provide history at this time. Per patient's daughter, the patient normally is able to communicate freely, is fully oriented, uses amotorized scooter, goes shopping with her management is usually aware of his surroundings. His wife usually helps him  a home, but given her diabetes and other health issues she has been unable to help him with daily care and activities lately. Patient was taken to a hospice house where he had a fall, was seen in the ER on 12/15/2016. Per records, prior to arrival to the ED that date, the patient received Haldol and Ativan at the hospice house. He also has been getting scopolamine. Since then, the patient's mental status has been progressively declining, with decrease oral intake and worsening physical status. He has developed decubiti ulcers. BSE ordered.      SLP Plan  Continue with current plan of care       Recommendations  Diet recommendations: Dysphagia 1 (puree);Thin liquid (ground meats) Liquids provided via: Cup;Straw Medication Administration: Whole meds with puree Supervision: Staff to assist with self feeding;Full supervision/cueing for compensatory strategies Compensations: Slow rate;Multiple dry swallows after each bite/sip;Lingual sweep for clearance of pocketing;Follow solids with liquid Postural Changes and/or Swallow Maneuvers: Seated upright 90 degrees;Upright 30-60 min after meal                Oral Care Recommendations: Oral care BID;Staff/trained caregiver to provide oral care Follow up Recommendations: Skilled Nursing facility SLP Visit Diagnosis: Dysphagia, oropharyngeal phase (R13.12) Plan: Continue with current plan of care       Thank you,  Genene Churn, [place]                 [place] 12/30/2016, 12:17 PM

## 2016-12-30 NOTE — Clinical Social Work Placement (Signed)
   CLINICAL SOCIAL WORK PLACEMENT  NOTE  Date:  12/30/2016  Patient Details  Name: Andre Holder MRN: [id number] Date of Birth: 1922/12/02  Clinical Social Work is seeking post-discharge placement for this patient at the [place] level of care (*CSW will initial, date and re-position this form in  chart as items are completed):  Yes   Patient/family provided with [place] Work Department's list of facilities offering this level of care within the geographic area requested by the patient (or if unable, by the patient's family).  Yes   Patient/family informed of their freedom to choose among providers that offer the needed level of care, that participate in Medicare, Medicaid or managed care program needed by the patient, have an available bed and are willing to accept the patient.  Yes   Patient/family informed of [facility]'s ownership interest in [facility] and [facility], as well as of the fact that they are under no obligation to receive care at these facilities.  PASRR submitted to EDS on 12/30/16     PASRR number received on 12/30/16     Existing PASRR number confirmed on       FL2 transmitted to all facilities in geographic area requested by pt/family on 12/30/16     FL2 transmitted to all facilities within larger geographic area on       Patient informed that his/her managed care company has contracts with or will negotiate with certain facilities, including the following:            Patient/family informed of bed offers received.  Patient chooses bed at       Physician recommends and patient chooses bed at      Patient to be transferred to   on  .  Patient to be transferred to facility by       Patient family notified on   of transfer.  Name of family member notified:        PHYSICIAN       Additional Comment:    _______________________________________________ Ihor Gully, LCSW 12/30/2016, 2:00 PM

## 2016-12-30 NOTE — Care Management Note (Deleted)
Case Management Note  Patient Details  Name: Andre Holder MRN: [id number] Date of Birth: 1923-05-20  If discussed at Long Length of Stay Meetings, dates discussed:  12/30/2016 Additional Comments:  Amariona Rathje, Chauncey Reading, RN 12/30/2016, 10:24 AM

## 2016-12-30 NOTE — NC FL2 (Signed)
[place] LEVEL OF CARE SCREENING TOOL     IDENTIFICATION  Patient Name: Andre Holder Birthdate: July 15, 1923 Sex: male Admission Date (Current Location): 12/24/2016  [facility] and [place] Number:  [employer] and Address:  [facility] [address], [place]      Provider Number: [id number]  Attending Physician Name and Address:  Asencion Noble, MD  Relative Name and Phone Number:       Current Level of Care: Hospital Recommended Level of Care: [place] Prior Approval Number:    Date Approved/Denied:   PASRR Number: [id number]  Discharge Plan: SNF    Current Diagnoses: Patient Active Problem List   Diagnosis Date Noted  . Protein-calorie malnutrition, severe 12/27/2016  . Hypernatremia 12/24/2016  . Pressure injury of skin 12/24/2016  . UTI (urinary tract infection) 12/24/2016  . Shortness of breath 01/18/2016  . Edema 01/18/2016  . Protein calorie malnutrition ([place]) 01/18/2016  . Acute on chronic systolic congestive heart failure ([place])   . Edema extremities   . Partial small bowel obstruction ([place]) 11/05/2015  . Anemia 11/05/2015  . Malnutrition of moderate degree ([place]) 01/11/2015  . Small bowel obstruction ([place]) 12/23/2014  . Chronic systolic congestive heart failure, NYHA class 2 ([place]) 12/23/2014  . SBO (small bowel obstruction) ([place]) 12/23/2014  . CHF exacerbation ([place]) 08/31/2014  . Acute on chronic systolic CHF (congestive heart failure) ([place]) 08/31/2014  . Chest pain 08/31/2014  . Dyspnea 08/31/2014  . CHF (congestive heart failure) ([place]) 08/25/2013  . Syncope 10/09/2011  . Hyponatremia 10/09/2011  . GERD 06/17/2010  . WEIGHT LOSS, ABNORMAL 06/17/2010  . ANEMIA 04/08/2010  . Mobitz type II atrioventricular block 04/08/2010  . Hyperlipidemia 10/24/2009  . PACEMAKER, PERMANENT 09/20/2009  . Malignant neoplasm of larynx ([place]) 03/07/2009  . Aortic valve disorder 03/07/2009  . CEREBROVASCULAR  DISEASE 03/07/2009  . PERIPHERAL VASCULAR DISEASE 03/07/2009  . Tobacco abuse, in remission 03/07/2009  . Hypothyroidism 12/27/2008  . OSTEOARTHRITIS 12/27/2008    Orientation RESPIRATION BLADDER Height & Weight     Self  Tracheostomy (Trachestomy collar 5L) Incontinent Weight: 139 lb 5.3 oz (63.2 kg) Height:  6\' 2"  (188 cm)  BEHAVIORAL SYMPTOMS/MOOD NEUROLOGICAL BOWEL NUTRITION STATUS      Incontinent Diet (DIET DYS 1. Fluid consistency thin. 100% supervision for all meals.  OK for po meds whole or crushed as able in puree when pt is alert and upright.  Please provide ground meats.)  AMBULATORY STATUS COMMUNICATION OF NEEDS Skin   Extensive Assist Verbally (Uses Amplified Microphone) Other (Comment) (Sacrum, deep tissue injury; right foot, deep tissue injury)                       Personal Care Assistance Level of Assistance  Bathing, Feeding, Dressing Bathing Assistance: Maximum assistance Feeding assistance: Maximum assistance Dressing Assistance: Maximum assistance     Functional Limitations Info  Sight, Hearing, Speech Sight Info: Adequate Hearing Info: Adequate Speech Info: Impaired (uses amplified microphone)    SPECIAL CARE FACTORS FREQUENCY  PT (By licensed PT), Speech therapy     PT Frequency: 5x/week       Speech Therapy Frequency: 3x/week      Contractures      Additional Factors Info  Code Status, Isolation Precautions Code Status Info: DNR       Isolation Precautions Info: Patient Information      Current Medications (12/30/2016):  This is the current hospital active medication list  Current Facility-Administered Medications  Medication Dose Route Frequency Provider Last Rate Last Dose  . acetaminophen (TYLENOL) tablet 500 mg  500 mg Oral Q6H PRN Reubin Milan, MD   500 mg at 12/30/16 1209  . aspirin EC tablet 81 mg  81 mg Oral Daily Reubin Milan, MD   81 mg at 12/30/16 0933  . cefTRIAXone (ROCEPHIN) 1 g in dextrose 5 % 50 mL IVPB   1 g Intravenous Q24H Reubin Milan, MD   Stopped at 12/29/16 1725  . chlorhexidine (PERIDEX) 0.12 % solution 15 mL  15 mL Mouth Rinse BID Reubin Milan, MD   15 mL at 12/30/16 0932  . famotidine (PEPCID) tablet 20 mg  20 mg Oral q1800 Asencion Noble, MD   20 mg at 12/29/16 1656  . finasteride (PROSCAR) tablet 5 mg  5 mg Oral QHS Reubin Milan, MD   5 mg at 12/29/16 2258  . fluconazole (DIFLUCAN) tablet 100 mg  100 mg Oral Daily Asencion Noble, MD   100 mg at 12/30/16 0933  . guaiFENesin (MUCINEX) 12 hr tablet 1,200 mg  1,200 mg Oral BID PRN Reubin Milan, MD      . heparin injection 5,000 Units  5,000 Units Subcutaneous Q8H Reubin Milan, MD   5,000 Units at 12/30/16 0600  . levothyroxine (SYNTHROID, LEVOTHROID) tablet 200 mcg  200 mcg Oral QAC breakfast Reubin Milan, MD   200 mcg at 12/30/16 4235  . MEDLINE mouth rinse  15 mL Mouth Rinse q12n4p Reubin Milan, MD   15 mL at 12/30/16 1223  . ondansetron (ZOFRAN) tablet 4 mg  4 mg Oral Q6H PRN Reubin Milan, MD       Or  . ondansetron [facility]) injection 4 mg  4 mg Intravenous Q6H PRN Reubin Milan, MD      . pantoprazole (PROTONIX) EC tablet 40 mg  40 mg Oral Daily Reubin Milan, MD   40 mg at 12/30/16 0933  . potassium chloride SA (K-DUR,KLOR-CON) CR tablet 20 mEq  20 mEq Oral TID Asencion Noble, MD   20 mEq at 12/30/16 0933  . tamsulosin (FLOMAX) capsule 0.4 mg  0.4 mg Oral BID Reubin Milan, MD   0.4 mg at 12/30/16 3614     Discharge Medications: Please see discharge summary for a list of discharge medications.  Relevant Imaging Results:  Relevant Lab Results:   Additional Information SSN [id number]   Rayson Rando, Clydene Pugh, [place]

## 2016-12-30 NOTE — Progress Notes (Signed)
Staples removed from patients head per Dr Beverlee Nims order.  Pt tolerated well.

## 2016-12-30 NOTE — Evaluation (Signed)
Physical Therapy Evaluation Patient Details Name: Andre Holder MRN: [id number] DOB: 14-Feb-1923 Today's Date: 12/30/2016   History of Present Illness  83 y.o. male with medical history significant of adrenal hyperplasia, anemia, Nonocclusive CAD, mild aortic stenosis, history of Mobitz type II AV block, history of pacemaker placement, peripheral vascular disease, borderline hypertension, hyperlipidemia, carotid stenosis, DJD, GERD, hypothyroidism who was brought to the emergency department for progressively worse altered mental status in the past 9 days, following a fall.  He was recently at the hospice house for 1 week of respite care when he had a fall.  He was evaluated in the ED 12/15/2016. Per records, prior to arrival to the ED that date, the patient received Haldol and Ativan at the hospice house. He also has been getting scopolamine. Since then, the patient's mental status has been progressively declining, with decrease oral intake and worsening physical status. He has developed decubiti ulcers.  Chest radiograph and CT of the head did not show any acute abnormalities.    Clinical Impression  Pt received in bed, no family is currently present, and pt is agreeable to PT evaluation.  Pt is very difficult to understand, and has trouble placing his amplified microphone in correct position for good speaking quality.  PLOF obtained from the patient and from chart review.  Pt has had multiple falls recently, and was active with hospice at home.  He normally lives with his wife at home, and is ambulatory with RW.  He had recently spent 1week at hospice where he sustained a fall.  Since that fall he has been non-ambulatory.  During PT evaluation today, he required Max A for transfer supine<>sit.  Once he was sitting on the EOB, he demonstrates strong push/lean to the left and posteriorly.  He is not safe to attempt standing at this time due to strong posterior lean.  He required total A for transfer  sit<>Supine and supine scoot in the bed.  At this time, he is not safe to d/c home due to frequent falls, as well as need for increased level of assistance at this time.  He is recommended for SNF.    Follow Up Recommendations SNF    Equipment Recommendations  None recommended by PT    Recommendations for Other Services       Precautions / Restrictions Precautions Precautions: Fall Precaution Comments: Due to immobility Restrictions Weight Bearing Restrictions: No      Mobility  Bed Mobility Overal bed mobility: Needs Assistance Bed Mobility: Supine to Sit;Sit to Supine     Supine to sit: Max assist;HOB elevated Sit to supine: Total assist   General bed mobility comments: increased time and use of bed pad to assist pt's hips to the EOB.  Once sitting on the EOB, he requires Max A to maintain static sitting posture.  Transfers Overall transfer level:  (NA due to poor static sitting posture. )                  Ambulation/Gait                Stairs            Wheelchair Mobility    Modified Rankin (Stroke Patients Only)       Balance Overall balance assessment: History of Falls;Needs assistance Sitting-balance support: Feet supported;Bilateral upper extremity supported Sitting balance-Leahy Scale: Zero Sitting balance - Comments: Pt demonstrates a strong push/lean to the left upon sitting on the EOB.  He requires constant cues  for upright posture, leaning forward, and leaning to the right.  Pt requires max A to weight shift to the right.                                     Pertinent Vitals/Pain Pain Assessment: Faces Faces Pain Scale: Hurts whole lot Pain Location: R hip - 12/15/2016 XR B hips: No evidence of fracture or dislocation. Pain Intervention(s): Limited activity within patient's tolerance;Monitored during session;Repositioned    Home Living   Living Arrangements: Spouse/significant other Available Help at Discharge:  Family Type of Home: House Home Access: Ramped entrance     Home Layout: Two level;Able to live on main level with bedroom/bathroom Home Equipment: Kasandra Knudsen - single point;Walker - 4 wheels      Prior Function Level of Independence: Independent with assistive device(s)   Gait / Transfers Assistance Needed: Pt ambulates with a walker.  Per H&P he uses a motorized scooter to access the community.    ADL's / Homemaking Assistance Needed: independent when at home.          Hand Dominance        Extremity/Trunk Assessment   Upper Extremity Assessment Upper Extremity Assessment: Generalized weakness    Lower Extremity Assessment Lower Extremity Assessment: Generalized weakness       Communication   Communication: Tracheostomy (uses amplified microphone - very difficult to understand with pt not placing the microphone in the correct place.  )  Cognition Arousal/Alertness: Awake/alert Behavior During Therapy: WFL for tasks assessed/performed Overall Cognitive Status: Difficult to assess                                        General Comments      Exercises     Assessment/Plan    PT Assessment Patient needs continued PT services  PT Problem List Decreased strength;Decreased activity tolerance;Decreased balance;Decreased mobility;Decreased knowledge of use of DME;Decreased safety awareness;Decreased skin integrity       PT Treatment Interventions DME instruction;Gait training;Functional mobility training;Therapeutic activities;Therapeutic exercise;Balance training;Patient/family education    PT Goals (Current goals can be found in the Care Plan section)  Acute Rehab PT Goals PT Goal Formulation: Patient unable to participate in goal setting Time For Goal Achievement: 01/13/17 Potential to Achieve Goals: Fair    Frequency Min 3X/week   Barriers to discharge Decreased caregiver support Pt lives with his wife, who is unable to provide the level of care  that pt currently requires.    Co-evaluation               End of Session Equipment Utilized During Treatment: Oxygen (old tracheostomy) Activity Tolerance: Patient tolerated treatment well Patient left: in bed;with bed alarm set;with call bell/phone within reach Nurse Communication: Mobility status;Need for lift equipment (mobiltiy sheet left hanging in the pt's room.   Maxi move for transfers out of bed<>chair. ) PT Visit Diagnosis: Muscle weakness (generalized) (M62.81);History of falling (Z91.81);Other abnormalities of gait and mobility (R26.89)    Time: 1012-1050 PT Time Calculation (min) (ACUTE ONLY): 38 min   Charges:   PT Evaluation $PT Eval Moderate Complexity: 1 Procedure PT Treatments $Therapeutic Activity: 23-37 mins   PT G Codes:   PT G-Codes **NOT FOR INPATIENT CLASS** Functional Assessment Tool Used: AM-PAC 6 Clicks Basic Mobility;Clinical judgement Functional Limitation: Mobility: Walking and moving around  Mobility: Walking and Moving Around Current Status [phone]): At least 80 percent but less than 100 percent impaired, limited or restricted Mobility: Walking and Moving Around Goal Status [phone]): At least 60 percent but less than 80 percent impaired, limited or restricted    Beth Sidonie Dexheimer, PT, DPT X: [phone]

## 2016-12-31 LAB — TSH: TSH: 0.456 u[IU]/mL (ref 0.350–4.500)

## 2016-12-31 NOTE — Care Management Important Message (Signed)
Important Message  Patient Details  Name: Andre Holder MRN: [id number] Date of Birth: 01/20/1923   Medicare Important Message Given:  Yes    Fadil Macmaster, Chauncey Reading, RN 12/31/2016, 11:42 AM

## 2016-12-31 NOTE — Progress Notes (Signed)
Subjective: Trying to speak but I am unable to understand him. No assistive device available for him apparently at this time.  Objective: Vital signs in last 24 hours: Vitals:   12/30/16 1610 12/30/16 2115 12/30/16 2151 12/31/16 0638  BP:   (!) 126/53 111/65  Pulse:  68 72 84  Resp:  18 18 18   Temp:   98.8 F (37.1 C) 98.5 F (36.9 C)  TempSrc:   Oral Oral  SpO2: 99% 98% 95% 100%  Weight:      Height:       Weight change:   Intake/Output Summary (Last 24 hours) at 12/31/16 0652 Last data filed at 12/31/16 0639  Gross per 24 hour  Intake               50 ml  Output              900 ml  Net             -850 ml    Physical Exam: Pharynx moist. Lungs clear. Heart regular with a grade 3 systolic murmur. Abdomen is soft and nontender with no hepatosplenomegaly. Extremities reveal no edema.  Lab Results:   No results found for this or any previous visit (from the past 24 hour(s)).   ABGS No results for input(s): PHART, PO2ART, TCO2, HCO3 in the last 72 hours.  Invalid input(s): PCO2 CULTURES Recent Results (from the past 240 hour(s))  Blood Culture (routine x 2)     Status: None   Collection Time: 12/24/16  2:53 PM  Result Value Ref Range Status   Specimen Description BLOOD RIGHT HAND  Final   Special Requests   Final    BOTTLES DRAWN AEROBIC ONLY Blood Culture adequate volume   Culture NO GROWTH 5 DAYS  Final   Report Status 12/29/2016 FINAL  Final  Blood Culture (routine x 2)     Status: None   Collection Time: 12/24/16  2:59 PM  Result Value Ref Range Status   Specimen Description BLOOD LEFT HAND  Final   Special Requests   Final    BOTTLES DRAWN AEROBIC AND ANAEROBIC Blood Culture adequate volume   Culture NO GROWTH 5 DAYS  Final   Report Status 12/29/2016 FINAL  Final  MRSA PCR Screening     Status: Abnormal   Collection Time: 12/24/16  8:37 PM  Result Value Ref Range Status   MRSA by PCR POSITIVE (A) NEGATIVE Final    Comment:        The GeneXpert MRSA  Assay (FDA approved for NASAL specimens only), is one component of a comprehensive MRSA colonization surveillance program. It is not intended to diagnose MRSA infection nor to guide or monitor treatment for MRSA infections. RESULT CALLED TO, READ BACK BY AND VERIFIED WITH:  SMITH,F @ 0008 ON 12/25/16 BY JUW    Studies/Results: No results found. Micro Results: Recent Results (from the past 240 hour(s))  Blood Culture (routine x 2)     Status: None   Collection Time: 12/24/16  2:53 PM  Result Value Ref Range Status   Specimen Description BLOOD RIGHT HAND  Final   Special Requests   Final    BOTTLES DRAWN AEROBIC ONLY Blood Culture adequate volume   Culture NO GROWTH 5 DAYS  Final   Report Status 12/29/2016 FINAL  Final  Blood Culture (routine x 2)     Status: None   Collection Time: 12/24/16  2:59 PM  Result Value Ref Range Status  Specimen Description BLOOD LEFT HAND  Final   Special Requests   Final    BOTTLES DRAWN AEROBIC AND ANAEROBIC Blood Culture adequate volume   Culture NO GROWTH 5 DAYS  Final   Report Status 12/29/2016 FINAL  Final  MRSA PCR Screening     Status: Abnormal   Collection Time: 12/24/16  8:37 PM  Result Value Ref Range Status   MRSA by PCR POSITIVE (A) NEGATIVE Final    Comment:        The GeneXpert MRSA Assay (FDA approved for NASAL specimens only), is one component of a comprehensive MRSA colonization surveillance program. It is not intended to diagnose MRSA infection nor to guide or monitor treatment for MRSA infections. RESULT CALLED TO, READ BACK BY AND VERIFIED WITH:  SMITH,F @ 0008 ON 12/25/16 BY JUW    Studies/Results: No results found. Medications:  I have reviewed the patient's current medications Scheduled Meds: . aspirin EC  81 mg Oral Daily  . chlorhexidine  15 mL Mouth Rinse BID  . famotidine  20 mg Oral q1800  . finasteride  5 mg Oral QHS  . fluconazole  100 mg Oral Daily  . heparin  5,000 Units Subcutaneous Q8H  .  levothyroxine  200 mcg Oral QAC breakfast  . mouth rinse  15 mL Mouth Rinse q12n4p  . pantoprazole  40 mg Oral Daily  . tamsulosin  0.4 mg Oral BID   Continuous Infusions: . cefTRIAXone (ROCEPHIN)  IV Stopped (12/30/16 1639)   PRN Meds:.acetaminophen, guaiFENesin, ondansetron **OR** ondansetron (ZOFRAN) IV   Assessment/Plan: #1. Acute kidney injury/dehydration. Much improved. #2. Protein calorie malnutrition. Continue supplements. #3. Deconditioning/marked weakness. Reportedly needs a Hoyer lift to move from bed to chair. We'll need SNF. We'll need continued physical therapy. #4. UTI. Today is day 7 of ceftriaxone. #5. Chronic systolic heart failure. Torsemide is still held. Principal Problem:   Hypernatremia Active Problems:   Hypothyroidism   Chronic systolic congestive heart failure, NYHA class 2 (HCC)   Anemia   Pressure injury of skin   UTI (urinary tract infection)   Protein-calorie malnutrition, severe     LOS: 7 days   Andre Holder 12/31/2016, 6:52 AM

## 2017-01-01 MED ORDER — TORSEMIDE 20 MG PO TABS: 20.0000 mg | ORAL_TABLET | ORAL | 0 refills | Status: AC

## 2017-01-01 NOTE — Progress Notes (Signed)
IV removed, WNL. Pt awaiting ems arrival.

## 2017-01-01 NOTE — Discharge Summary (Signed)
Physician Discharge Summary  Andre Holder:[id number] DOB: [birth year] DOA: 12/24/2016   Admit date: 12/24/2016 Discharge date: 01/01/2017  Discharge Diagnoses:  Principal Problem:   Hypernatremia Active Problems:   Hypothyroidism   Chronic systolic congestive heart failure, NYHA class 2 (HCC)   Anemia   Pressure injury of skin   UTI (urinary tract infection)   Protein-calorie malnutrition, severe    Wt Readings from Last 3 Encounters:  12/27/16 139 lb 5.3 oz (63.2 kg)  12/15/16 160 lb (72.6 kg)  12/08/16 160 lb (72.6 kg)     Hospital Course:  This patient is a 83 year old male with a history of chronic systolic heart failure who was hospitalized with acute kidney injury and hypernatremia. The patient had evidently been at respite care at hospice. He had returned home and became very weak. BUN and creatinine were 120 and 2.55. He was hyponatremic at 151. He was treated with intravenous fluids. Diuretic therapy was held. Sodium normalized to 138. BUN and creatinine improved to 33 and 0.8. He had evidence of UTI with 6-30 WBCs on his urinalysis initially and was treated for UTI with ceftriaxone. He has completed a seven-day course. His white count improved from 18.3-14.3. Blood cultures were negative. Initially had a CT scan of the head which revealed no acute abnormalities. He has severe protein calorie malnutrition with an albumin of 2.2. He has been treated with nutritional supplements. Hypothyroidism is treated with levothyroxine. He is euthyroid with a TSH of 0.456. He was found to have multiple areas of skin breakdown which have been treated with dressing changes and avoidance of pressure to the sites. He is generally quite weak and is unable to sit up or stand up on his own. He has been evaluated by physical therapy.  He has had no symptoms of recurrent heart failure. Follow-up chest x-ray reveals no edema or infiltrate. Torsemide will be restarted at an every other day dose.  Recommend repeat metabolic profile and CBC in 3 days.  He had remote laryngeal carcinoma and tracheostomy. He has received supplemental oxygen.  Arrangements have been made for skilled care. Condition is much improved at the time of discharge. Lungs are clear. Heart regular with a grade 2 systolic murmur consistent with his aortic stenosis. Abdomen is flat and nontender. As noted multiple areas of skin breakdown have been present on his sacral area and lower extremities.   Discharge Instructions  Discharge Instructions    Diet - low sodium heart healthy    Complete by:  As directed    Increase activity slowly    Complete by:  As directed      Allergies as of 01/01/2017   No Known Allergies     Medication List    STOP taking these medications   acetaminophen 500 MG tablet Commonly known as:  TYLENOL   cephALEXin 500 MG capsule Commonly known as:  KEFLEX   fluconazole 200 MG tablet Commonly known as:  DIFLUCAN   haloperidol 10 MG tablet Commonly known as:  HALDOL   haloperidol 2 MG/ML solution Commonly known as:  HALDOL   levofloxacin 750 MG tablet Commonly known as:  LEVAQUIN   LORazepam 2 MG/ML concentrated solution Commonly known as:  ATIVAN   neomycin-bacitracin-polymyxin ointment Commonly known as:  NEOSPORIN   OXYCODONE-ACETAMINOPHEN PO   potassium chloride 10 MEQ tablet Commonly known as:  K-DUR   scopolamine 1 MG/3DAYS Commonly known as:  TRANSDERM-SCOP   triamcinolone 0.025 % ointment Commonly known as:  KENALOG  TAKE these medications   aspirin EC 81 MG tablet Take 81 mg by mouth daily.   finasteride 5 MG tablet Commonly known as:  PROSCAR Take 5 mg by mouth at bedtime.   levothyroxine 200 MCG tablet Commonly known as:  SYNTHROID, LEVOTHROID Take 200 mcg by mouth daily before breakfast.   MUCUS RELIEF 400 MG Tabs tablet Generic drug:  guaifenesin Take 400 mg by mouth every 4 (four) hours as needed (for mucus).   multivitamin  tablet Take 1 tablet by mouth daily.   OXYGEN Inhale 2 L into the lungs daily.   pantoprazole 40 MG tablet Commonly known as:  PROTONIX Take 40 mg by mouth daily.   tamsulosin 0.4 MG Caps capsule Commonly known as:  FLOMAX Take 0.4 mg by mouth 2 (two) times daily.   torsemide 20 MG tablet Commonly known as:  DEMADEX Take 1 tablet (20 mg total) by mouth 2 days. What changed:  when to take this   VOLTAREN 1 % Gel Generic drug:  diclofenac sodium Apply 2 g topically daily as needed (pain).        [name] 01/01/2017

## 2017-01-01 NOTE — Clinical Social Work Placement (Signed)
   CLINICAL SOCIAL WORK PLACEMENT  NOTE  Date:  01/01/2017  Patient Details  Name: Andre Holder MRN: [id number] Date of Birth: Jun 14, 1923  Clinical Social Work is seeking post-discharge placement for this patient at the [place] level of care (*CSW will initial, date and re-position this form in  chart as items are completed):  Yes   Patient/family provided with [place] Work Department's list of facilities offering this level of care within the geographic area requested by the patient (or if unable, by the patient's family).  Yes   Patient/family informed of their freedom to choose among providers that offer the needed level of care, that participate in Medicare, Medicaid or managed care program needed by the patient, have an available bed and are willing to accept the patient.  Yes   Patient/family informed of [place]'s ownership interest in [facility] and [facility], as well as of the fact that they are under no obligation to receive care at these facilities.  PASRR submitted to EDS on 12/30/16     PASRR number received on 12/30/16     Existing PASRR number confirmed on       FL2 transmitted to all facilities in geographic area requested by pt/family on 12/30/16     FL2 transmitted to all facilities within larger geographic area on       Patient informed that his/her managed care company has contracts with or will negotiate with certain facilities, including the following:        Yes   Patient/family informed of bed offers received.  Patient chooses bed at [facility]     Physician recommends and patient chooses bed at      Patient to be transferred to [facility] on 01/01/17.  Patient to be transferred to facility by [facility]     Patient family notified on 01/01/17 of transfer.  Name of family member notified:  Spouse, Mrs. [name]     PHYSICIAN       Additional Comment: Facility notified of discharge.  Clinicals sent to facility. LCSW signing off.    _______________________________________________ Ihor Gully, LCSW 01/01/2017, 11:17 AM

## 2017-01-01 NOTE — Progress Notes (Signed)
Report called to [facility] in [place], [place]. Given to [place]. EMS expected to arrive around 2:00 pm to transport patient.

## 2017-01-01 NOTE — Care Management Note (Signed)
Case Management Note  Patient Details  Name: Andre Holder MRN: [id number] Date of Birth: 05-18-1923   If discussed at [facility] Length of Stay Meetings, dates discussed:  01/01/2017  Additional Comments: Patient discharging to SNF today.  Andre Holder, Chauncey Reading, RN 01/01/2017, 10:29 AM

## 2017-01-01 NOTE — Clinical Social Work Note (Signed)
LCSW notified Gerald Stabs at [facility] that patient was discharging today.    LCSW notified patient's spouse of discharge.     LCSW arranged transport through [facility].     LCSW signing off.      Nikia Levels, Clydene Pugh, LCSW

## 2017-01-07 ENCOUNTER — Emergency Department (HOSPITAL_COMMUNITY)
Admission: EM | Admit: 2017-01-07 | Discharge: 2017-01-07 | Disposition: A | Discharge status: Skilled Nursing Facility | Payer: Medicare HMO | Attending: Internal Medicine | Admitting: Internal Medicine

## 2017-01-07 ENCOUNTER — Encounter (HOSPITAL_COMMUNITY)
Admission: EM | Disposition: A | Discharge status: Skilled Nursing Facility | Payer: Self-pay | Source: Home / Self Care | Attending: Emergency Medicine

## 2017-01-07 ENCOUNTER — Emergency Department (HOSPITAL_COMMUNITY): Payer: Medicare HMO

## 2017-01-07 ENCOUNTER — Encounter (HOSPITAL_COMMUNITY): Payer: Self-pay | Admitting: *Deleted

## 2017-01-07 ENCOUNTER — Other Ambulatory Visit (INDEPENDENT_AMBULATORY_CARE_PROVIDER_SITE_OTHER): Payer: Self-pay | Admitting: Internal Medicine

## 2017-01-07 ENCOUNTER — Ambulatory Visit (HOSPITAL_COMMUNITY): Admit: 2017-01-07 | Payer: Medicare HMO | Admitting: Internal Medicine

## 2017-01-07 DIAGNOSIS — K3189 Other diseases of stomach and duodenum: Secondary | ICD-10-CM | POA: Insufficient documentation

## 2017-01-07 DIAGNOSIS — Z85828 Personal history of other malignant neoplasm of skin: Secondary | ICD-10-CM | POA: Diagnosis not present

## 2017-01-07 DIAGNOSIS — I2510 Atherosclerotic heart disease of native coronary artery without angina pectoris: Secondary | ICD-10-CM | POA: Diagnosis not present

## 2017-01-07 DIAGNOSIS — R1310 Dysphagia, unspecified: Secondary | ICD-10-CM

## 2017-01-07 DIAGNOSIS — I110 Hypertensive heart disease with heart failure: Secondary | ICD-10-CM | POA: Insufficient documentation

## 2017-01-07 DIAGNOSIS — I441 Atrioventricular block, second degree: Secondary | ICD-10-CM | POA: Insufficient documentation

## 2017-01-07 DIAGNOSIS — Z8521 Personal history of malignant neoplasm of larynx: Secondary | ICD-10-CM | POA: Diagnosis not present

## 2017-01-07 DIAGNOSIS — K219 Gastro-esophageal reflux disease without esophagitis: Secondary | ICD-10-CM | POA: Insufficient documentation

## 2017-01-07 DIAGNOSIS — I340 Nonrheumatic mitral (valve) insufficiency: Secondary | ICD-10-CM | POA: Diagnosis not present

## 2017-01-07 DIAGNOSIS — Z9981 Dependence on supplemental oxygen: Secondary | ICD-10-CM | POA: Insufficient documentation

## 2017-01-07 DIAGNOSIS — K449 Diaphragmatic hernia without obstruction or gangrene: Secondary | ICD-10-CM | POA: Insufficient documentation

## 2017-01-07 DIAGNOSIS — Z9002 Acquired absence of larynx: Secondary | ICD-10-CM | POA: Diagnosis not present

## 2017-01-07 DIAGNOSIS — I739 Peripheral vascular disease, unspecified: Secondary | ICD-10-CM | POA: Insufficient documentation

## 2017-01-07 DIAGNOSIS — X58XXXA Exposure to other specified factors, initial encounter: Secondary | ICD-10-CM | POA: Insufficient documentation

## 2017-01-07 DIAGNOSIS — Z96653 Presence of artificial knee joint, bilateral: Secondary | ICD-10-CM | POA: Insufficient documentation

## 2017-01-07 DIAGNOSIS — Z79899 Other long term (current) drug therapy: Secondary | ICD-10-CM | POA: Diagnosis not present

## 2017-01-07 DIAGNOSIS — Z950 Presence of cardiac pacemaker: Secondary | ICD-10-CM | POA: Diagnosis not present

## 2017-01-07 DIAGNOSIS — T17228A Food in pharynx causing other injury, initial encounter: Secondary | ICD-10-CM | POA: Insufficient documentation

## 2017-01-07 DIAGNOSIS — Z87891 Personal history of nicotine dependence: Secondary | ICD-10-CM | POA: Insufficient documentation

## 2017-01-07 DIAGNOSIS — E039 Hypothyroidism, unspecified: Secondary | ICD-10-CM | POA: Insufficient documentation

## 2017-01-07 DIAGNOSIS — Z7983 Long term (current) use of bisphosphonates: Secondary | ICD-10-CM | POA: Diagnosis not present

## 2017-01-07 DIAGNOSIS — I5022 Chronic systolic (congestive) heart failure: Secondary | ICD-10-CM | POA: Insufficient documentation

## 2017-01-07 DIAGNOSIS — Z7982 Long term (current) use of aspirin: Secondary | ICD-10-CM | POA: Diagnosis not present

## 2017-01-07 DIAGNOSIS — E785 Hyperlipidemia, unspecified: Secondary | ICD-10-CM | POA: Insufficient documentation

## 2017-01-07 HISTORY — PX: ESOPHAGOGASTRODUODENOSCOPY: SHX5428

## 2017-01-07 LAB — CBC WITH DIFFERENTIAL/PLATELET
Basophils Absolute: 0.1 10*3/uL (ref 0.0–0.1)
Basophils Relative: 1 %
EOS ABS: 0.2 10*3/uL (ref 0.0–0.7)
Eosinophils Relative: 3 %
HEMATOCRIT: 31.1 % — AB (ref 39.0–52.0)
HEMOGLOBIN: 10.5 g/dL — AB (ref 13.0–17.0)
LYMPHS ABS: 0.9 10*3/uL (ref 0.7–4.0)
Lymphocytes Relative: 14 %
MCH: 33.8 pg (ref 26.0–34.0)
MCHC: 33.8 g/dL (ref 30.0–36.0)
MCV: 100 fL (ref 78.0–100.0)
MONOS PCT: 10 %
Monocytes Absolute: 0.6 10*3/uL (ref 0.1–1.0)
NEUTROS ABS: 4.6 10*3/uL (ref 1.7–7.7)
NEUTROS PCT: 72 %
Platelets: 325 10*3/uL (ref 150–400)
RBC: 3.11 MIL/uL — AB (ref 4.22–5.81)
RDW: 13.6 % (ref 11.5–15.5)
WBC: 6.4 10*3/uL (ref 4.0–10.5)

## 2017-01-07 LAB — BASIC METABOLIC PANEL
Anion gap: 7 (ref 5–15)
BUN: 18 mg/dL (ref 6–20)
CHLORIDE: 96 mmol/L — AB (ref 101–111)
CO2: 28 mmol/L (ref 22–32)
Calcium: 8.3 mg/dL — ABNORMAL LOW (ref 8.9–10.3)
Creatinine, Ser: 0.81 mg/dL (ref 0.61–1.24)
GFR calc non Af Amer: >60 mL/min (ref >60)
Glucose, Bld: 102 mg/dL — ABNORMAL HIGH (ref 65–99)
Potassium: 4.4 mmol/L (ref 3.5–5.1)
SODIUM: 131 mmol/L — AB (ref 135–145)

## 2017-01-07 SURGERY — EGD (ESOPHAGOGASTRODUODENOSCOPY)
Anesthesia: Moderate Sedation

## 2017-01-07 MED ORDER — LIDOCAINE VISCOUS 2 % MT SOLN
OROMUCOSAL | Status: AC
  Filled 2017-01-07: qty 15

## 2017-01-07 MED ORDER — SODIUM CHLORIDE 0.9 % IV BOLUS (SEPSIS)
500.0000 mL | Freq: Once | INTRAVENOUS | Status: AC
  Administered 2017-01-07: 500 mL via INTRAVENOUS

## 2017-01-07 MED ORDER — MIDAZOLAM HCL 5 MG/5ML IJ SOLN
INTRAMUSCULAR | Status: DC | PRN
  Administered 2017-01-07: 1 mg via INTRAVENOUS

## 2017-01-07 MED ORDER — MIDAZOLAM HCL 5 MG/5ML IJ SOLN
INTRAMUSCULAR | Status: AC
  Filled 2017-01-07: qty 5

## 2017-01-07 MED ORDER — SODIUM CHLORIDE 0.9 % IV SOLN
INTRAVENOUS | Status: DC
  Administered 2017-01-07: 1000 mL via INTRAVENOUS

## 2017-01-07 MED ORDER — LIDOCAINE VISCOUS 2 % MT SOLN
OROMUCOSAL | Status: DC | PRN
  Administered 2017-01-07: 1 via OROMUCOSAL

## 2017-01-07 MED ORDER — IOPAMIDOL (ISOVUE-300) INJECTION 61%
INTRAVENOUS | Status: AC
  Administered 2017-01-07: 150 mL via ORAL
  Filled 2017-01-07: qty 150

## 2017-01-07 MED ORDER — MEPERIDINE HCL 50 MG/ML IJ SOLN
INTRAMUSCULAR | Status: DC
Start: 2017-01-07 — End: 2017-01-07
  Filled 2017-01-07: qty 1

## 2017-01-07 NOTE — Progress Notes (Signed)
Patient picked up by EMS at 1616. VSS. Drank Boost. No complications noted. Moved to EMS stretcher with no problems. Report called to [facility] in [place], [place], by [employer].

## 2017-01-07 NOTE — ED Notes (Signed)
Pt is constantly calling out to go to Endo.  Pt and wife has been infomed that pt will be worked into endo already scheduled pts.

## 2017-01-07 NOTE — Op Note (Signed)
[facility] Patient Name: Andre Holder Procedure Date: 01/07/2017 2:27 PM MRN: [id number] Date of Birth: Aug 06, 1923 Attending MD: Hildred Laser , MD CSN: [id number] Age: 83 Admit Type: Inpatient Procedure:                Upper GI endoscopy Indications:              Foreign body in the GI tract(hypopharynx and UES) Providers:                Hildred Laser, MD, Lurline Del, RN, Aram Candela Referring MD:             Nat Christen, MD Medicines:                Lidocaine spray, Midazolam 1 mg IV Complications:            No immediate complications. Estimated Blood Loss:     Estimated blood loss: none. Procedure:                Pre-Anesthesia Assessment:                           - Prior to the procedure, a History and Physical                            was performed, and patient medications and                            allergies were reviewed. The patient's tolerance of                            previous anesthesia was also reviewed. The risks                            and benefits of the procedure and the sedation                            options and risks were discussed with the patient.                            All questions were answered, and informed consent                            was obtained. Prior Anticoagulants: The patient                            last took aspirin 1 day prior to the procedure. ASA                            Grade Assessment: III - A patient with severe                            systemic disease. After reviewing the risks and                            benefits, the patient was deemed in satisfactory  condition to undergo the procedure.                           After obtaining informed consent, the endoscope was                            passed under direct vision. Throughout the                            procedure, the patient's blood pressure, pulse, and                            oxygen saturations were monitored  continuously. The                            EG-[id number] ([id number]) scope was introduced through the                            mouth, and advanced to the second part of duodenum.                            The upper GI endoscopy was accomplished without                            difficulty. The patient tolerated the procedure                            well. Scope In: 2:53:11 PM Scope Out: 3:07:01 PM Total Procedure Duration: 0 hours 13 minutes 50 seconds  Findings:      Food was found in hypopharynx and UES. Removal was accomplished with a       snare.      The examined esophagus was normal.      The Z-line was regular and was found 39 cm from the incisors.      A 3 cm hiatal hernia was present.      Atrophic mucosa was found in the gastric body. Focal intestinal metapsia       in prepyloric region      The exam of the stomach was otherwise normal.      The duodenal bulb and second portion of the duodenum were normal. Impression:               - Food was found in hypopharynx and UES. Removal                            was successful with polypectomy snare.                           - Normal esophagus.                           - Z-line regular, 39 cm from the incisors.                           - 3 cm hiatal hernia.                           -  Gastric mucosal atrophy.                           - Focal intestinal metaplasia and prepyloric region                           - Normal duodenal bulb and second portion of the                            duodenum. Moderate Sedation:      Moderate (conscious) sedation was administered by the endoscopy nurse       and supervised by the endoscopist. The following parameters were       monitored: oxygen saturation, heart rate, blood pressure, CO2       capnography and response to care. Total physician intraservice time was       17 minutes. Recommendation:           - Patient has a contact number available for                             emergencies. The signs and symptoms of potential                            delayed complications were discussed with the                            patient. Return to normal activities tomorrow.                            Written discharge instructions were provided to the                            patient.                           - Pureed diet today.                           - Continue present medications. Procedure Code(s):        --- Professional ---                           [phone], Esophagogastroduodenoscopy, flexible,                            transoral; with removal of foreign body(s)                           99152, Moderate sedation services provided by the                            same physician or other qualified health care                            professional performing the diagnostic or  therapeutic service that the sedation supports,                            requiring the presence of an independent trained                            observer to assist in the monitoring of the                            patient's level of consciousness and physiological                            status; initial 15 minutes of intraservice time,                            patient age 30 years or older Diagnosis Code(s):        --- Professional ---                           [phone], Food in esophagus causing other injury,                            initial encounter                           K44.9, Diaphragmatic hernia without obstruction or                            gangrene                           K31.89, Other diseases of stomach and duodenum                           T18.9XXA, Foreign body of alimentary tract, part                            unspecified, initial encounter CPT copyright 2016 [employer] Medical Association. All rights reserved. The codes documented in this report are preliminary and upon coder review may  be revised to meet current  compliance requirements. Hildred Laser, MD Hildred Laser, MD 01/07/2017 3:22:08 PM This report has been signed electronically. Number of Addenda: 0

## 2017-01-07 NOTE — Discharge Instructions (Signed)
Resume usual medications and pureed diet.    Upper Endoscopy, Care After Refer to this sheet in the next few weeks. These instructions provide you with information about caring for yourself after your procedure. Your health care provider may also give you more specific instructions. Your treatment has been planned according to current medical practices, but problems sometimes occur. Call your health care provider if you have any problems or questions after your procedure. What can I expect after the procedure? After the procedure, it is common to have:  A sore throat.  Bloating.  Nausea. Follow these instructions at home:  Follow instructions from your health care provider about what to eat or drink after your procedure.  Return to your normal activities as told by your health care provider. Ask your health care provider what activities are safe for you.  Take over-the-counter and prescription medicines only as told by your health care provider.  Do not drive for 24 hours if you received a sedative.  Keep all follow-up visits as told by your health care provider. This is important. Contact a health care provider if:  You have a sore throat that lasts longer than one day.  You have trouble swallowing. Get help right away if:  You have a fever.  You vomit blood or your vomit looks like coffee grounds.  You have bloody, black, or tarry stools.  You have a severe sore throat or you cannot swallow.  You have difficulty breathing.  You have severe pain in your chest or belly. This information is not intended to replace advice given to you by your health care provider. Make sure you discuss any questions you have with your health care provider. Document Released: 02/24/2012 Document Revised: 01/31/2016 Document Reviewed: 06/07/2015 Elsevier Interactive Patient Education  2017 Elsevier Inc.   Dysphagia Diet Level 1, Pureed The dysphasia level 1 diet includes foods that are  completely pureed and smooth. The foods have a pudding-like texture, such as the texture of pureed pancakes, mashed potatoes, and yogurt. The diet does not include foods with lumps or coarse textures. Liquids should be smooth and may either be thin, nectar-thick, honey-like, or spoon-thick. This diet is helpful for people with moderate to severe swallowing problems. It reduces the risk of food getting caught in the windpipe, trachea, or lungs. You may need help or supervision during meals while following this diet. What do I need to know about this diet? Foods   You may eat foods that are soft and have a pudding-like texture. If a food does not have this texture, you may be able to eat the food after:  Pureeing it. This can be done with a blender or whisk.  Moistening it with liquid. For example, you may have bread if you soak it in milk or syrup.  Avoid foods that are hard, dry, sticky, chunky, lumpy, or stringy. Also avoid foods with nuts, seeds, raisins, skins, and pulp.  Do not eat foods that you have to chew. If you have to chew the food, then you cannot eat it.  Eat a variety of foods to get all the nutrients you need. Liquids   You may drink liquids that are smooth. Your health care provider will tell you if you should drink thin or thickened liquids.  To thicken a liquid, use a food and beverage thickener or a thickening food. Thickened liquids are usually a pudding-like consistency.  Thin liquids include fruit juices, milk, coffee, tea, yogurts, shakes, and similar foods that  melt to thin liquid at room temperature.  Avoid liquids with seeds, pulp, or chunks. See your dietitian or health care provider regularly for help with your dietary changes. What foods can I eat? Grains  Store-bought soft breads, pancakes, and [place] toast that have a smooth, moist texture and do not have nuts or seeds (you will need to moisten the food with liquid). Cooked cereals that have a pudding-like  consistency, such as cream of wheat or farina (no oatmeal). Pureed, well-cooked pasta, rice, and plain bread stuffing. Vegetables  Pureed vegetables. Soft avocado. Smooth tomato paste or sauce. Strained or pureed soups (these may need to be thickened as directed). Mashed or pureed potatoes without skin (can be seasoned with butter, smooth gravy, margarine, or sour cream). Fruits  Pureed fruits such as melons and apples without seeds or pulp. Mashed bananas. Smooth tomato paste or sauce. Fruit juices without pulp or seeds. Strained or pureed soups. Meat and Other Protein Sources  Pureed meat. Smooth pate or liverwurst. Smooth souffles. Pureed beans (such as lentils). Pureed eggs. Dairy  Yogurt. Smooth cheese sauces. Milk (may need to be thickened). Nutritional dairy drinks or shakes. Ask your health care provider whether you can have ice cream. Condiments  Finely ground salt, pepper, and other ground spices. Sweets/Desserts  Smooth puddings and custards. Pureed desserts. Souffles. Whipped topping. Ask your health care provider whether you can have frozen desserts. Fats and Oils  Butter. Margarine. Smooth and strained gravy. Sour cream. Mayonnaise. Cream cheese. Whipped topping. Smooth sauces (such as white sauce, cheese sauce, or hollandaise sauce). The items listed above may not be a complete list of recommended foods or beverages. Contact your dietitian for more options.  What foods are not recommended? Grains  Oatmeal. Dry cereals. Hard breads. Vegetables  Whole vegetables. Stringy vegetables (such as celery). Thin tomato sauce. Fruits  Whole fresh, frozen, canned, or dried fruits that have not been pureed. Stringy fruits (such as pineapple). Meat and Other Protein Sources  Whole or ground meat, fish, or poultry. Dried or cooked lentils or legumes that have been cooked but not mashed or pureed. Non-pureed eggs. Nuts and seeds. Peanut butter. Dairy  Non-pureed cheese. Dairy products with  lumps or chunks. Ask your health care provider whether you can have ice cream. Condiments  Coarse or seeded herbs and spices. Sweets/Desserts  [place] preserves. Jams with seeds. Solid desserts. Sticky, chewy sweets (such as licorice and caramel). Ask your health care provider whether you can have frozen desserts. Fats and Oils  Sauces of fats with lumps or chunks. The items listed above may not be a complete list of foods and beverages to avoid. Contact your dietitian for more information.  This information is not intended to replace advice given to you by your health care provider. Make sure you discuss any questions you have with your health care provider. Document Released: 08/25/2005 Document Revised: 01/31/2016 Document Reviewed: 08/08/2013 Elsevier Interactive Patient Education  2017 [employer].

## 2017-01-07 NOTE — H&P (Signed)
Andre Holder is an 83 y.o. male.   Chief Complaint: Patient is here for EGD foreign body removal and possible esophageal dilation. HPI: Patient is 83-year-old Caucasian male with multiple medical problems who presented to emergency room early this morning with inability to swallow solids or liquids. His symptoms began last night when he was eating meat. He underwent esophagogram with water-soluble contrast and is suggested foreign body and proximal esophagus. Patient is therefore undergoing therapeutic EGD. He has chronic GERD. He states heartburns well controlled with PPI. He has not had dysphagia prior to this episode. He shouldn't is a resident of rest home and he walks very little.  Past Medical History:  Diagnosis Date  . Adrenal hyperplasia (HCC)    Stable on serial imaging  . Anemia    minimal in 2011 with hemoglobin of 12.2 and high normal MCV  . Arteriosclerotic cardiovascular disease (ASCVD)    Nonobstructive; 09/2008 50% proximal and 40% mid LAD; 25% circumflex; 30% RCA; mild global LV dysfunction with EF of 45%. No aortic stenosis.  . Borderline hypertension    Normal CMet in 2011  . Cancer of larynx (HCC)    laryngectomy in 1988; postoperative radiation therapy  . Carotid stenosis   . 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 Degenerative joint disease    s/p bilateral TKR  . GERD (gastroesophageal reflux disease)   . Hyperlipidemia    Lipid profile in 04/2010:115, 98, 43, 52.  . Hypothyroidism   . Mild aortic stenosis    not documented at catheterization; verified by echo in 2011  . Mitral regurgitation   . Mobitz (type) II atrioventricular block    With bradycardia; Medtronic pacemaker implanted in 09/2008  . Peripheral vascular disease (HCC)    With a 70% innominate artery stenosis and nonobstructive carotid stenosis  . Skin cancer   . Small bowel obstruction (HCC)   . Tobacco abuse, in remission    Remote  . Weight loss    50 pounds between 1991 and 2011    Past Surgical History:  Procedure Laterality Date  . APPENDECTOMY  1973  . CATARACT EXTRACTION, BILATERAL    . DECOMPRESSION FACIAL NERVE     Right median  . INSERT / REPLACE / REMOVE PACEMAKER    . KNEE ARTHROSCOPY     Left  . LARYNGECTOMY  1988   S/P laryngectomy and radiation therapy  . PACEMAKER INSERTION    . TOTAL KNEE ARTHROPLASTY     Bilateral, 19 years ago    Family History  Problem Relation Age of Onset  . Stroke Mother   . Leukemia Father   . Stroke Other   . Diabetes Other   . Colon cancer Neg Hx   . Liver disease Neg Hx   . GI problems Neg Hx    Social History:  reports that he has quit smoking. His smoking use included Cigarettes. He quit smokeless tobacco use about 34 years ago. He reports that he does not drink alcohol or use drugs.  Allergies: No Known Allergies  Medications Prior to Admission  Medication Sig Dispense Refill  . acetaminophen (TYLENOL) 500 MG tablet Take 500 mg by mouth every 6 (six) hours as needed.    . aspirin EC 81 MG tablet Take 81 mg by mouth daily.    . diclofenac sodium (VOLTAREN) 1 % GEL Apply 2 g topically daily as needed (pain).    . guaifenesin (MUCUS RELIEF)   400 MG TABS tablet Take 400 mg by mouth every 4 (four) hours as needed (for mucus).    . levothyroxine (SYNTHROID, LEVOTHROID) 200 MCG tablet Take 200 mcg by mouth daily before breakfast.    . Multiple Vitamin (MULTIVITAMIN) tablet Take 1 tablet by mouth daily.    . OXYGEN Inhale 2 L into the lungs daily.    . pantoprazole (PROTONIX) 40 MG tablet Take 40 mg by mouth daily.     . Tamsulosin HCl (FLOMAX) 0.4 MG CAPS Take 0.4 mg by mouth 2 (two) times daily.     . torsemide (DEMADEX) 20 MG tablet Take 1 tablet (20 mg total) by mouth 2 days. 30 tablet 0  . finasteride (PROSCAR) 5 MG tablet Take 5 mg by mouth at bedtime.      Results for orders placed or performed during the hospital encounter of 01/07/17 (from the past 48 hour(s))  CBC  with Differential     Status: Abnormal   Collection Time: 01/07/17 11:55 AM  Result Value Ref Range   WBC 6.4 4.0 - 10.5 K/uL   RBC 3.11 (L) 4.22 - 5.81 MIL/uL   Hemoglobin 10.5 (L) 13.0 - 17.0 g/dL   HCT 31.1 (L) 39.0 - 52.0 %   MCV 100.0 78.0 - 100.0 fL   MCH 33.8 26.0 - 34.0 pg   MCHC 33.8 30.0 - 36.0 g/dL   RDW 13.6 11.5 - 15.5 %   Platelets 325 150 - 400 K/uL   Neutrophils Relative % 72 %   Neutro Abs 4.6 1.7 - 7.7 K/uL   Lymphocytes Relative 14 %   Lymphs Abs 0.9 0.7 - 4.0 K/uL   Monocytes Relative 10 %   Monocytes Absolute 0.6 0.1 - 1.0 K/uL   Eosinophils Relative 3 %   Eosinophils Absolute 0.2 0.0 - 0.7 K/uL   Basophils Relative 1 %   Basophils Absolute 0.1 0.0 - 0.1 K/uL  Basic metabolic panel     Status: Abnormal   Collection Time: 01/07/17 11:55 AM  Result Value Ref Range   Sodium 131 (L) 135 - 145 mmol/L   Potassium 4.4 3.5 - 5.1 mmol/L   Chloride 96 (L) 101 - 111 mmol/L   CO2 28 22 - 32 mmol/L   Glucose, Bld 102 (H) 65 - 99 mg/dL   BUN 18 6 - 20 mg/dL   Creatinine, Ser 0.81 0.61 - 1.24 mg/dL   Calcium 8.3 (L) 8.9 - 10.3 mg/dL   GFR calc non Af Amer >60 >60 mL/min   GFR calc Af Amer >60 >60 mL/min    Comment: (NOTE) The eGFR has been calculated using the CKD EPI equation. This calculation has not been validated in all clinical situations. eGFR's persistently <60 mL/min signify possible Chronic Kidney Disease.    Anion gap 7 5 - 15   Dg Esophagus  Result Date: 01/07/2017 CLINICAL DATA:  Feels like something stuck in throat. One liquid diet, but was biscuits. EXAM: ESOPHOGRAM/BARIUM SWALLOW TECHNIQUE: Single contrast examination was performed using water-soluble isotonic contrast. FLUOROSCOPY TIME:  Fluoroscopy Time:  30 seconds Radiation Exposure Index (if provided by the fluoroscopic device): 1.7 mGy Number of Acquired Spot Images: 0 COMPARISON:  None. FINDINGS: The patient cannot stand, and he was semi recumbent on the table. Status post laryngectomy with  stoma. Irregular filling defect above the smoothly narrowed neopharynx, consistent with food impaction in this setting. The neopharynx maximal the diameter is 8 x 6mm. IMPRESSION: Mass with high-grade obstruction above the smoothly narrowed neopharynx (  8 x 6 mm luminal diameter), consistent with food impaction in this setting. Electronically Signed   By: Monte Fantasia M.D.   On: 01/07/2017 09:32    ROS  Blood pressure 129/74, pulse 72, temperature 98.8 F (37.1 C), temperature source Oral, resp. rate 17, height 6' 2" (1.88 m), weight 164 lb (74.4 kg), SpO2 96 %. Physical Exam  Constitutional:  Well-developed thin Caucasian male in NAD.  HENT:  Mouth/Throat: Oropharynx is clear and moist.  Patient is edentulous.  Eyes: Conjunctivae are normal. No scleral icterus.  Neck:  Patient has  tracheostomy. No neck masses or thyromegaly noted.  Cardiovascular: Normal rate, regular rhythm and normal heart sounds.   No murmur heard. Respiratory: Effort normal and breath sounds normal.  GI:  Abdomen is flat soft and nontender without organomegaly or masses.  Musculoskeletal: He exhibits no edema.  Neurological: He is alert.  Skin: Skin is warm and dry.     Assessment/Plan Foreign body esophagus. EGD with foreign body removal and possible esophageal dilation.  Hildred Laser, MD 01/07/2017, 2:44 PM

## 2017-01-07 NOTE — ED Notes (Signed)
Well healing pressure ulcer to right elbow, right and left lateral feet, and redness noted to tip of right great toe.  Pt stated that he has sacral decub.  Pt repositioned and feet are elevated on pts pillows.

## 2017-01-07 NOTE — ED Notes (Signed)
Patient is demanding food and something to drink. This nurse informed patient that we could not give him anything to drink until doctor come to talk to him about his results.  Patient's wife wants something for her husband to drink as well. This nurse informed patient's wife that patient could not have anything to drink.

## 2017-01-07 NOTE — ED Triage Notes (Signed)
Pt arrived from the [facility] in [place] by EMS. Per the nurse pt is to be on thin & pureed foods. Tonight pt was seen eating biscuit & fat back. Pt wanted to be suctioned so nurse suctions soma & pt took suction & put down throat.

## 2017-01-07 NOTE — ED Provider Notes (Signed)
[facility] DEPT Provider Note   CSN: [id number] Arrival date & time: 01/07/17  0973  Time seen 05:30 AM   History   Chief Complaint Chief Complaint  Patient presents with  . ? throat irratation    HPI Andre Holder is a 83 y.o. male.  HPI  history is limited due to patient having a laryngectomy and using a electrical voice device. Patient states he can't swallow since yesterday. His nursing facility reports he was sitting eating biscuits and fat back however he states that's not true. He denies feeling short of breath. He states he's never had trouble swallowing before. Patient does not have a gastroenterologist.  PCP Asencion Noble, MD   Past Medical History:  Diagnosis Date  . Adrenal hyperplasia ([place])    Stable on serial imaging  . Anemia    minimal in 2011 with hemoglobin of 12.2 and high normal MCV  . Arteriosclerotic cardiovascular disease (ASCVD)    Nonobstructive; 09/2008 50% proximal and 40% mid LAD; 25% circumflex; 30% RCA; mild global LV dysfunction with EF of 45%. No aortic stenosis.  . Borderline hypertension    Normal CMet in 2011  . Cancer of larynx ([place])    laryngectomy in 1988; postoperative radiation therapy  . Carotid stenosis   . 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 Degenerative joint disease    s/p bilateral TKR  . GERD (gastroesophageal reflux disease)   . Hyperlipidemia    Lipid profile in 04/2010:115, 98, 43, 52.  Marland Kitchen Hypothyroidism   . Mild aortic stenosis    not documented at catheterization; verified by echo in 2011  . Mitral regurgitation   . Mobitz (type) II atrioventricular block    With bradycardia; Medtronic pacemaker implanted in 09/2008  . Peripheral vascular disease ([place])    With a 70% innominate artery stenosis and nonobstructive carotid stenosis  . Skin cancer   . Small bowel obstruction ([place])   . Tobacco abuse, in remission    Remote  . Weight loss    50 pounds between 1991 and 2011      Patient Active Problem List   Diagnosis Date Noted  . Protein-calorie malnutrition, severe 12/27/2016  . Hypernatremia 12/24/2016  . Pressure injury of skin 12/24/2016  . UTI (urinary tract infection) 12/24/2016  . Shortness of breath 01/18/2016  . Edema 01/18/2016  . Protein calorie malnutrition ([place]) 01/18/2016  . Acute on chronic systolic congestive heart failure ([place])   . Edema extremities   . Partial small bowel obstruction ([place]) 11/05/2015  . Anemia 11/05/2015  . Malnutrition of moderate degree ([place]) 01/11/2015  . Small bowel obstruction ([place]) 12/23/2014  . Chronic systolic congestive heart failure, NYHA class 2 ([place]) 12/23/2014  . SBO (small bowel obstruction) ([place]) 12/23/2014  . CHF exacerbation ([place]) 08/31/2014  . Acute on chronic systolic CHF (congestive heart failure) ([place]) 08/31/2014  . Chest pain 08/31/2014  . Dyspnea 08/31/2014  . CHF (congestive heart failure) ([place]) 08/25/2013  . Syncope 10/09/2011  . Hyponatremia 10/09/2011  . GERD 06/17/2010  . WEIGHT LOSS, ABNORMAL 06/17/2010  . ANEMIA 04/08/2010  . Mobitz type II atrioventricular block 04/08/2010  . Hyperlipidemia 10/24/2009  . PACEMAKER, PERMANENT 09/20/2009  . Malignant neoplasm of larynx ([place]) 03/07/2009  . Aortic valve disorder 03/07/2009  . CEREBROVASCULAR DISEASE 03/07/2009  . PERIPHERAL VASCULAR DISEASE 03/07/2009  . Tobacco abuse, in remission 03/07/2009  . Hypothyroidism 12/27/2008  . OSTEOARTHRITIS 12/27/2008    Past Surgical History:  Procedure Laterality Date  . APPENDECTOMY  1973  . CATARACT EXTRACTION, BILATERAL    . DECOMPRESSION FACIAL NERVE     Right median  . INSERT / REPLACE / REMOVE PACEMAKER    . KNEE ARTHROSCOPY     Left  . LARYNGECTOMY  1988   S/P laryngectomy and radiation therapy  . PACEMAKER INSERTION    . TOTAL KNEE ARTHROPLASTY     Bilateral, 19 years ago       Home Medications    Prior to Admission medications   Medication Sig Start Date End Date  Taking? Authorizing Provider  acetaminophen (TYLENOL) 500 MG tablet Take 500 mg by mouth every 6 (six) hours as needed.   Yes Historical Provider, MD  aspirin EC 81 MG tablet Take 81 mg by mouth daily.   Yes Historical Provider, MD  finasteride (PROSCAR) 5 MG tablet Take 5 mg by mouth at bedtime.   Yes Historical Provider, MD  guaifenesin (MUCUS RELIEF) 400 MG TABS tablet Take 400 mg by mouth every 4 (four) hours as needed (for mucus).   Yes Historical Provider, MD  levothyroxine (SYNTHROID, LEVOTHROID) 200 MCG tablet Take 200 mcg by mouth daily before breakfast.   Yes Historical Provider, MD  Multiple Vitamin (MULTIVITAMIN) tablet Take 1 tablet by mouth daily.   Yes Historical Provider, MD  OXYGEN Inhale 2 L into the lungs daily.   Yes Historical Provider, MD  pantoprazole (PROTONIX) 40 MG tablet Take 40 mg by mouth daily.    Yes Historical Provider, MD  Tamsulosin HCl (FLOMAX) 0.4 MG CAPS Take 0.4 mg by mouth 2 (two) times daily.    Yes Historical Provider, MD  torsemide (DEMADEX) 20 MG tablet Take 1 tablet (20 mg total) by mouth 2 days. 01/01/17 01/07/17 Yes Asencion Noble, MD  diclofenac sodium (VOLTAREN) 1 % GEL Apply 2 g topically daily as needed (pain).    Historical Provider, MD    Family History Family History  Problem Relation Age of Onset  . Stroke Mother   . Leukemia Father   . Stroke Other   . Diabetes Other   . Colon cancer Neg Hx   . Liver disease Neg Hx   . GI problems Neg Hx     Social History Social History  Substance Use Topics  . Smoking status: Former Smoker    Types: Cigarettes  . Smokeless tobacco: Former Systems developer    Quit date: 09/08/1982     Comment: Quit 30 years  . Alcohol use No  lives in a NH   Allergies   Patient has no known allergies.   Review of Systems Review of Systems  All other systems reviewed and are negative.    Physical Exam Updated Vital Signs BP (!) 99/57   Pulse 69   Temp 99.5 F (37.5 C) (Oral)   Resp 14   Wt 139 lb (63 kg)   SpO2  95%   BMI 17.85 kg/m   Vital signs normal except borderline temp and mildly elevated temperature   Physical Exam  Constitutional: He is oriented to person, place, and time.  Thin elderly male in NAD  HENT:  Head: Normocephalic and atraumatic.  Right Ear: External ear normal.  Left Ear: External ear normal.  Nose: Nose normal.  Mouth/Throat: Oropharynx is clear and moist.  Eyes: Conjunctivae and EOM are normal. Pupils are equal, round, and reactive to light.  Neck: Normal range of motion. Neck supple.  Tracheostomy present   Cardiovascular: Normal rate, regular rhythm and  normal heart sounds.  Exam reveals no gallop and no friction rub.   No murmur heard. Pulmonary/Chest: Effort normal and breath sounds normal. No respiratory distress. He has no wheezes. He has no rales. He exhibits no tenderness.  Neurological: He is alert and oriented to person, place, and time. No cranial nerve deficit.  Skin: Skin is warm and dry.  Psychiatric: He has a normal mood and affect. His behavior is normal.  Nursing note and vitals reviewed.    ED Treatments / Results   Procedures Procedures (including critical care time)  Medications Ordered in ED Medications - No data to display   Initial Impression / Assessment and Plan / ED Course  I have reviewed the triage vital signs and the nursing notes.  Pertinent labs & imaging results that were available during my care of the patient were reviewed by me and considered in my medical decision making (see chart for details).  Patient is in no distress. He does not appear short of breath. His lungs are clear. He is not spitting or drooling. Unfortunately the radiologist will not be here for about another 3 hours. Patient was informed that he would need to wait to have a barium swallow done. At this point I do not feel that I need to have GI emergently do endoscopy on this patient. We'll see how he does with the barium swallow and at that point decide if  he needs to have endoscopy done.  07:10 AM pt turned over to Dr Lacinda Axon at change of shift to get the results of his barium swallow.   Final Clinical Impressions(s) / ED Diagnoses   Final diagnoses:  Dysphagia, unspecified type    Disposition pending  Rolland Porter, MD, Barbette Or, MD 01/07/17 [phone]

## 2017-01-07 NOTE — ED Provider Notes (Signed)
Discussed barium swallow results with Dr. Laural Golden.  He will take patient to endoscopy.   Nat Christen, MD 01/07/17 [phone]

## 2017-01-21 ENCOUNTER — Telehealth (INDEPENDENT_AMBULATORY_CARE_PROVIDER_SITE_OTHER): Payer: Self-pay | Admitting: *Deleted

## 2017-01-21 NOTE — Telephone Encounter (Signed)
Patient's wife presented to the office this morning. She is concerned about her husband and the recent hernia that was found. He recently had a procedure to remove chicken from the throat. Patient has a history of throat cancer/vociebox. He tells them that his foods ar not going down right.  This was discussed with Dr.Rehman. The hernia is not the problem as it was small hernia. The patient has a motility problem and should eat soft foods.  I could not reach his wife but did call and talk with his daughter.

## 2017-01-23 ENCOUNTER — Encounter (HOSPITAL_COMMUNITY): Payer: Self-pay | Admitting: Internal Medicine

## 2017-02-08 ENCOUNTER — Encounter (HOSPITAL_COMMUNITY): Payer: Self-pay

## 2017-02-08 ENCOUNTER — Emergency Department (HOSPITAL_COMMUNITY)
Admission: EM | Admit: 2017-02-08 | Discharge: 2017-02-08 | Disposition: A | Discharge status: Home / Self Care | Payer: Medicare HMO | Attending: Emergency Medicine | Admitting: Emergency Medicine

## 2017-02-08 DIAGNOSIS — Z85828 Personal history of other malignant neoplasm of skin: Secondary | ICD-10-CM | POA: Insufficient documentation

## 2017-02-08 DIAGNOSIS — Z7982 Long term (current) use of aspirin: Secondary | ICD-10-CM | POA: Diagnosis not present

## 2017-02-08 DIAGNOSIS — Z79899 Other long term (current) drug therapy: Secondary | ICD-10-CM | POA: Insufficient documentation

## 2017-02-08 DIAGNOSIS — Z8521 Personal history of malignant neoplasm of larynx: Secondary | ICD-10-CM | POA: Insufficient documentation

## 2017-02-08 DIAGNOSIS — F329 Major depressive disorder, single episode, unspecified: Secondary | ICD-10-CM

## 2017-02-08 DIAGNOSIS — I5022 Chronic systolic (congestive) heart failure: Secondary | ICD-10-CM | POA: Insufficient documentation

## 2017-02-08 DIAGNOSIS — E039 Hypothyroidism, unspecified: Secondary | ICD-10-CM | POA: Diagnosis not present

## 2017-02-08 DIAGNOSIS — Z950 Presence of cardiac pacemaker: Secondary | ICD-10-CM | POA: Diagnosis not present

## 2017-02-08 DIAGNOSIS — Z87891 Personal history of nicotine dependence: Secondary | ICD-10-CM | POA: Diagnosis not present

## 2017-02-08 DIAGNOSIS — Z96653 Presence of artificial knee joint, bilateral: Secondary | ICD-10-CM | POA: Diagnosis not present

## 2017-02-08 DIAGNOSIS — F32A Depression, unspecified: Secondary | ICD-10-CM

## 2017-02-08 LAB — BASIC METABOLIC PANEL
Anion gap: 9 (ref 5–15)
BUN: 21 mg/dL — ABNORMAL HIGH (ref 6–20)
CALCIUM: 9 mg/dL (ref 8.9–10.3)
CHLORIDE: 102 mmol/L (ref 101–111)
CO2: 26 mmol/L (ref 22–32)
CREATININE: 0.68 mg/dL (ref 0.61–1.24)
GFR calc non Af Amer: >60 mL/min (ref >60)
Glucose, Bld: 99 mg/dL (ref 65–99)
Potassium: 4.2 mmol/L (ref 3.5–5.1)
Sodium: 137 mmol/L (ref 135–145)

## 2017-02-08 LAB — CBC WITH DIFFERENTIAL/PLATELET
BASOS PCT: 0 %
Basophils Absolute: 0 10*3/uL (ref 0.0–0.1)
EOS ABS: 0.2 10*3/uL (ref 0.0–0.7)
Eosinophils Relative: 2 %
HCT: 33.1 % — ABNORMAL LOW (ref 39.0–52.0)
HEMOGLOBIN: 11.3 g/dL — AB (ref 13.0–17.0)
Lymphocytes Relative: 11 %
Lymphs Abs: 1.1 10*3/uL (ref 0.7–4.0)
MCH: 33.7 pg (ref 26.0–34.0)
MCHC: 34.1 g/dL (ref 30.0–36.0)
MCV: 98.8 fL (ref 78.0–100.0)
MONOS PCT: 9 %
Monocytes Absolute: 0.9 10*3/uL (ref 0.1–1.0)
NEUTROS PCT: 78 %
Neutro Abs: 7.8 10*3/uL — ABNORMAL HIGH (ref 1.7–7.7)
Platelets: 290 10*3/uL (ref 150–400)
RBC: 3.35 MIL/uL — ABNORMAL LOW (ref 4.22–5.81)
RDW: 13.5 % (ref 11.5–15.5)
WBC: 10.1 10*3/uL (ref 4.0–10.5)

## 2017-02-08 LAB — ETHANOL: ALCOHOL ETHYL (B): 5 mg/dL — AB (ref <5)

## 2017-02-08 NOTE — ED Provider Notes (Addendum)
[facility] DEPT Provider Note   CSN: [id number] Arrival date & time: 02/08/17  0720     History   Chief Complaint Chief Complaint  Patient presents with  . V70.1    HPI Andre Holder is a 83 y.o. male.  Level V caveat secondary to difficulty understanding speech from a tracheostomy.  According to EMS and RN, patient expressed suicidal ideation by trying to cut himself with a broken piece of glass from a picture frame. He complains of old age, difficulty walking, generalized depression. Apparently, he recently ran out of trazodone which he had been taking at night for sleep.      Past Medical History:  Diagnosis Date  . Adrenal hyperplasia ([place])    Stable on serial imaging  . Anemia    minimal in 2011 with hemoglobin of 12.2 and high normal MCV  . Arteriosclerotic cardiovascular disease (ASCVD)    Nonobstructive; 09/2008 50% proximal and 40% mid LAD; 25% circumflex; 30% RCA; mild global LV dysfunction with EF of 45%. No aortic stenosis.  . Borderline hypertension    Normal CMet in 2011  . Cancer of larynx ([place])    laryngectomy in 1988; postoperative radiation therapy  . Carotid stenosis   . 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 Degenerative joint disease    s/p bilateral TKR  . GERD (gastroesophageal reflux disease)   . Hyperlipidemia    Lipid profile in 04/2010:115, 98, 43, 52.  Marland Kitchen Hypothyroidism   . Mild aortic stenosis    not documented at catheterization; verified by echo in 2011  . Mitral regurgitation   . Mobitz (type) II atrioventricular block    With bradycardia; Medtronic pacemaker implanted in 09/2008  . Peripheral vascular disease ([place])    With a 70% innominate artery stenosis and nonobstructive carotid stenosis  . Skin cancer   . Small bowel obstruction ([place])   . Tobacco abuse, in remission    Remote  . Weight loss    50 pounds between 1991 and 2011    Patient Active Problem List   Diagnosis Date  Noted  . Protein-calorie malnutrition, severe 12/27/2016  . Hypernatremia 12/24/2016  . Pressure injury of skin 12/24/2016  . UTI (urinary tract infection) 12/24/2016  . Shortness of breath 01/18/2016  . Edema 01/18/2016  . Protein calorie malnutrition ([place]) 01/18/2016  . Acute on chronic systolic congestive heart failure ([place])   . Edema extremities   . Partial small bowel obstruction ([place]) 11/05/2015  . Anemia 11/05/2015  . Malnutrition of moderate degree ([place]) 01/11/2015  . Small bowel obstruction ([place]) 12/23/2014  . Chronic systolic congestive heart failure, NYHA class 2 ([place]) 12/23/2014  . SBO (small bowel obstruction) ([place]) 12/23/2014  . CHF exacerbation ([place]) 08/31/2014  . Acute on chronic systolic CHF (congestive heart failure) ([place]) 08/31/2014  . Chest pain 08/31/2014  . Dyspnea 08/31/2014  . CHF (congestive heart failure) ([place]) 08/25/2013  . Syncope 10/09/2011  . Hyponatremia 10/09/2011  . GERD 06/17/2010  . WEIGHT LOSS, ABNORMAL 06/17/2010  . ANEMIA 04/08/2010  . Mobitz type II atrioventricular block 04/08/2010  . Hyperlipidemia 10/24/2009  . PACEMAKER, PERMANENT 09/20/2009  . Malignant neoplasm of larynx ([place]) 03/07/2009  . Aortic valve disorder 03/07/2009  . CEREBROVASCULAR DISEASE 03/07/2009  . PERIPHERAL VASCULAR DISEASE 03/07/2009  . Tobacco abuse, in remission 03/07/2009  . Hypothyroidism 12/27/2008  . OSTEOARTHRITIS 12/27/2008    Past Surgical History:  Procedure Laterality Date  . APPENDECTOMY  1973  . CATARACT EXTRACTION, BILATERAL    . DECOMPRESSION FACIAL NERVE     Right median  . ESOPHAGOGASTRODUODENOSCOPY N/A 01/07/2017   Procedure: ESOPHAGOGASTRODUODENOSCOPY (EGD);  Surgeon: Rogene Houston, MD;  Location[facility];  Service: Endoscopy;  Laterality: N/A;  . INSERT / REPLACE / REMOVE PACEMAKER    . KNEE ARTHROSCOPY     Left  . LARYNGECTOMY  1988   S/P laryngectomy and radiation therapy  . PACEMAKER INSERTION    . TOTAL KNEE ARTHROPLASTY      Bilateral, 19 years ago       Home Medications    Prior to Admission medications   Medication Sig Start Date End Date Taking? Authorizing Provider  acetaminophen (TYLENOL) 500 MG tablet Take 500 mg by mouth every 6 (six) hours as needed.    [provider]  aspirin EC 81 MG tablet Take 81 mg by mouth daily.    [provider]  diclofenac sodium (VOLTAREN) 1 % GEL Apply 2 g topically daily as needed (pain).    [provider]  finasteride (PROSCAR) 5 MG tablet Take 5 mg by mouth at bedtime.    [provider]  guaifenesin (MUCUS RELIEF) 400 MG TABS tablet Take 400 mg by mouth every 4 (four) hours as needed (for mucus).    [provider]  levothyroxine (SYNTHROID, LEVOTHROID) 200 MCG tablet Take 200 mcg by mouth daily before breakfast.    [provider]  Multiple Vitamin (MULTIVITAMIN) tablet Take 1 tablet by mouth daily.    [provider]  OXYGEN Inhale 2 L into the lungs daily.    [provider]  pantoprazole (PROTONIX) 40 MG tablet Take 40 mg by mouth daily.     [provider]  Tamsulosin HCl (FLOMAX) 0.4 MG CAPS Take 0.4 mg by mouth 2 (two) times daily.     [provider]  torsemide (DEMADEX) 20 MG tablet Take 1 tablet (20 mg total) by mouth 2 days. 01/01/17 01/07/17  Asencion Noble, MD    Family History Family History  Problem Relation Age of Onset  . Stroke Mother   . Leukemia Father   . Stroke Other   . Diabetes Other   . Colon cancer Neg Hx   . Liver disease Neg Hx   . GI problems Neg Hx     Social History Social History  Substance Use Topics  . Smoking status: Former Smoker    Types: Cigarettes  . Smokeless tobacco: Former Systems developer    Quit date: 09/08/1982     Comment: Quit 30 years  . Alcohol use No     Allergies   Patient has no known allergies.   Review of Systems Review of Systems  Reason unable to perform ROS: tracheostomy.     Physical Exam Updated Vital  Signs BP 100/83 (BP Location: Right Arm)   Pulse 86   Temp 97.5 F (36.4 C) (Oral)   SpO2 95%   Physical Exam  Constitutional: He is oriented to person, place, and time. He appears well-developed and well-nourished.  HENT:  Head: Normocephalic and atraumatic.  Eyes: Conjunctivae are normal.  Neck:  Tracheostomy intact  Cardiovascular: Normal rate and regular rhythm.   Pulmonary/Chest: Effort normal and breath sounds normal.  Abdominal: Soft. Bowel sounds are normal.  Musculoskeletal: Normal range of motion.  Neurological: He is alert and oriented to person, place, and time.  Skin: Skin is warm and dry.  Psychiatric:  Flat affect  Nursing note  and vitals reviewed.    ED Treatments / Results  Labs (all labs ordered are listed, but only abnormal results are displayed) Labs Reviewed  CBC WITH DIFFERENTIAL/PLATELET  BASIC METABOLIC PANEL  ETHANOL  RAPID URINE DRUG SCREEN, HOSP PERFORMED    EKG  EKG Interpretation None       Radiology No results found.  Procedures Procedures (including critical care time)  Medications Ordered in ED Medications - No data to display   Initial Impression / Assessment and Plan / ED Course  I have reviewed the triage vital signs and the nursing notes.  Pertinent labs & imaging results that were available during my care of the patient were reviewed by me and considered in my medical decision making (see chart for details).     Patient is depressed with suicidal ideation. Will obtain behavioral health consult.  1030:   No suicidal or homicidal ideation. Behavioral health recommends outpatient counseling. Final Clinical Impressions(s) / ED Diagnoses   Final diagnoses:  Depression, unspecified depression type    New Prescriptions New Prescriptions   No medications on file     Nat Christen, MD 02/08/17 1833    Nat Christen, MD 02/08/17 1042    Nat Christen, MD 02/08/17 1043

## 2017-02-08 NOTE — ED Notes (Signed)
Tele Psych in progress 

## 2017-02-08 NOTE — Discharge Instructions (Signed)
Recommend outpatient counseling with community mental health resources

## 2017-02-08 NOTE — ED Notes (Signed)
ED secretary aware that pt is to be transported home via EMS. EMS contacted and reported would notify ED when ambulance available.

## 2017-02-08 NOTE — ED Notes (Signed)
Spoke with pt's wife who states she has been trying to get pt into [facility] since the beginning of the year but has not followed up regarding this.  States she will call his daughter to make arrangements for discharge and will call me back.

## 2017-02-08 NOTE — ED Notes (Signed)
Pt sent to x-ray

## 2017-02-08 NOTE — ED Notes (Signed)
Pt daughter called and pt was verbally abusive with Network engineer and ED charge RN. Pt family reporting, "yall missed kidney failure one time and you don't feel a urine sample is warranted." Pt family informed that white blood cell count was normal and that doctor did not feel a urine sample was indicated at today's visit. Pt family remained agitated and reported "i am making an note of this and EMS has to bring him home". Pt daughter hung up phone at this time.

## 2017-02-08 NOTE — BH Assessment (Signed)
Tele Assessment Note   Andre Holder is a 83 y.o. male who presented to [facility] with physical complaint (mucus in throat, poor sleep over three nights).  To daughter and/or hospital staff, Pt made a comment to the effect that if he wanted to die, he would use a glass shard from a broken picture frame to kill himself.  Pt speaks with a voicebox machine due to trach, and sitter helped Pt communicate.  Pt provided history.  Pt stated that he came to the hospital because of a congested throat that he could not get clear.  To daughter or staff (it was unclear), he stated that if he wanted to die, he would cut his throat with a glass shard.  Pt denied feeling suicidal or homicidal.  Likewise, he denied auditory/visual hallucination and self-harm.  BAC and UDS were clear.  Pt complained of poor sleep.  He stated that he uses Trazodone for sleep, and he recently ran out.  As a result, he has not gotten any sleep over the last three nights.  Pt was oriented to place, person, and situation.  He said that the year was 2016.  Pt stated that he lives with his wife Andre Holder.  He also stated that his wife is trying to find him placement in an assisted living facility so he can receive greater care.    Consulted with Starleen Arms, NP.  As Pt denied suicidal ideation, past suicide attempts, homicidal ideation, hallucination, delusion, self-injurious behavior, and substance use, recommend discharge to home with possible follow-up at appropriate outpatient provider.  Diagnosis: Adjustment Disorder with Depressive features  Past Medical History:  Past Medical History:  Diagnosis Date  . Adrenal hyperplasia ([place])    Stable on serial imaging  . Anemia    minimal in 2011 with hemoglobin of 12.2 and high normal MCV  . Arteriosclerotic cardiovascular disease (ASCVD)    Nonobstructive; 09/2008 50% proximal and 40% mid LAD; 25% circumflex; 30% RCA; mild global LV dysfunction with EF of 45%. No aortic stenosis.  . Borderline  hypertension    Normal CMet in 2011  . Cancer of larynx ([place])    laryngectomy in 1988; postoperative radiation therapy  . Carotid stenosis   . 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 Degenerative joint disease    s/p bilateral TKR  . GERD (gastroesophageal reflux disease)   . Hyperlipidemia    Lipid profile in 04/2010:115, 98, 43, 52.  Marland Kitchen Hypothyroidism   . Mild aortic stenosis    not documented at catheterization; verified by echo in 2011  . Mitral regurgitation   . Mobitz (type) II atrioventricular block    With bradycardia; Medtronic pacemaker implanted in 09/2008  . Peripheral vascular disease ([place])    With a 70% innominate artery stenosis and nonobstructive carotid stenosis  . Skin cancer   . Small bowel obstruction ([place])   . Tobacco abuse, in remission    Remote  . Weight loss    50 pounds between 1991 and 2011    Past Surgical History:  Procedure Laterality Date  . APPENDECTOMY  1973  . CATARACT EXTRACTION, BILATERAL    . DECOMPRESSION FACIAL NERVE     Right median  . ESOPHAGOGASTRODUODENOSCOPY N/A 01/07/2017   Procedure: ESOPHAGOGASTRODUODENOSCOPY (EGD);  Surgeon: Rogene Houston, MD;  Location[facility];  Service: Endoscopy;  Laterality: N/A;  . INSERT / REPLACE / REMOVE PACEMAKER    . KNEE ARTHROSCOPY  Left  . LARYNGECTOMY  1988   S/P laryngectomy and radiation therapy  . PACEMAKER INSERTION    . TOTAL KNEE ARTHROPLASTY     Bilateral, 19 years ago    Family History:  Family History  Problem Relation Age of Onset  . Stroke Mother   . Leukemia Father   . Stroke Other   . Diabetes Other   . Colon cancer Neg Hx   . Liver disease Neg Hx   . GI problems Neg Hx     Social History:  reports that he has quit smoking. His smoking use included Cigarettes. He quit smokeless tobacco use about 34 years ago. He reports that he does not drink alcohol or use drugs.  Additional Social History:  Alcohol / Drug Use Pain  Medications: See MAR Prescriptions: See MAR Over the Counter: See MAR History of alcohol / drug use?: No history of alcohol / drug abuse  CIWA: CIWA-Ar BP: 100/83 Pulse Rate: 86 COWS:    PATIENT STRENGTHS: (choose at least two) Average or above average intelligence General fund of knowledge  Allergies: No Known Allergies  Home Medications:  (Not in a hospital admission)  OB/GYN Status:  No LMP for male patient.  General Assessment Data Location of Assessment: [facility] TTS Assessment: In system Is this a Tele or Face-to-Face Assessment?: Tele Assessment Is this an Initial Assessment or a Re-assessment for this encounter?: Initial Assessment Marital status: Married Is patient pregnant?: No Pregnancy Status: No Living Arrangements: Spouse/significant other Can pt return to current living arrangement?: Yes Admission Status: Voluntary Is patient capable of signing voluntary admission?: Yes Referral Source: Self/Family/Friend Insurance type: [employer]     Crisis Care Plan Living Arrangements: Spouse/significant other Legal Guardian:  (None) Name of Psychiatrist: None Name of Therapist: None  Education Status Is patient currently in school?: No  Risk to self with the past 6 months Suicidal Ideation: No-Not Currently/Within Last 6 Months Has patient been a risk to self within the past 6 months prior to admission? : No Suicidal Intent: No Has patient had any suicidal intent within the past 6 months prior to admission? : No Is patient at risk for suicide?: No Suicidal Plan?: No-Not Currently/Within Last 6 Months Has patient had any suicidal plan within the past 6 months prior to admission? : No Access to Means: No What has been your use of drugs/alcohol within the last 12 months?: Denied Previous Attempts/Gestures: No Intentional Self Injurious Behavior: None Family Suicide History: No Recent stressful life event(s): Recent negative physical changes Persecutory  voices/beliefs?: No Depression: Yes Depression Symptoms: Insomnia Substance abuse history and/or treatment for substance abuse?: No Suicide prevention information given to non-admitted patients: Not applicable  Risk to Others within the past 6 months Homicidal Ideation: No Does patient have any lifetime risk of violence toward others beyond the six months prior to admission? : No Thoughts of Harm to Others: No Current Homicidal Intent: No Current Homicidal Plan: No Access to Homicidal Means: No History of harm to others?: No Assessment of Violence: None Noted Does patient have access to weapons?: No Criminal Charges Pending?: No Does patient have a court date: No Is patient on probation?: No  Psychosis Hallucinations: None noted Delusions: None noted  Mental Status Report Appearance/Hygiene: Unremarkable, In scrubs Eye Contact: Fair Motor Activity: Freedom of movement, Unremarkable Speech: Unremarkable, Other (Comment) (Use of trach) Level of Consciousness: Alert Mood: Ambivalent Affect: Appropriate to circumstance Anxiety Level: None Thought Processes: Coherent, Relevant Judgement: Partial Orientation: Person, Place,  Situation Obsessive Compulsive Thoughts/Behaviors: None  Cognitive Functioning Concentration: Normal Memory: Recent Intact, Remote Intact IQ: Average Insight: Fair Impulse Control: Good Appetite: Good Sleep: Decreased Total Hours of Sleep: 0 (over last 3 nights) Vegetative Symptoms: None  ADLScreening [facility] Assessment Services) Patient's cognitive ability adequate to safely complete daily activities?: Yes Patient able to express need for assistance with ADLs?: Yes Independently performs ADLs?: Yes (appropriate for developmental age)  Prior Inpatient Therapy Prior Inpatient Therapy: No  Prior Outpatient Therapy Prior Outpatient Therapy: No Does patient have an ACCT team?: No Does patient have Intensive In-House Services?  : No Does patient have  [facility] services? : No Does patient have P4CC services?: No  ADL Screening (condition at time of admission) Patient's cognitive ability adequate to safely complete daily activities?: Yes Does the patient have difficulty seeing, even when wearing glasses/contacts?: No Does the patient have difficulty concentrating, remembering, or making decisions?: No Patient able to express need for assistance with ADLs?: Yes Does the patient have difficulty dressing or bathing?: No Independently performs ADLs?: Yes (appropriate for developmental age) Does the patient have difficulty walking or climbing stairs?: No Weakness of Legs: None Weakness of Arms/Hands: None  Home Assistive Devices/Equipment Home Assistive Devices/Equipment: None  Therapy Consults (therapy consults require a physician order) PT Evaluation Needed: No OT Evalulation Needed: No SLP Evaluation Needed: No Abuse/Neglect Assessment (Assessment to be complete while patient is alone) Physical Abuse: Denies Verbal Abuse: Denies Sexual Abuse: Denies Exploitation of patient/patient's resources: Denies Self-Neglect: Denies Values / Beliefs Cultural Requests During Hospitalization: None Spiritual Requests During Hospitalization: None Consults Spiritual Care Consult Needed: No Social Work Consult Needed: No Regulatory affairs officer (For Healthcare) Does Patient Have a Medical Advance Directive?: No Type of Advance Directive: Living will Copy of Living Will in Chart?: No - copy requested    Additional Information 1:1 In Past 12 Months?: No CIRT Risk: No Elopement Risk: No Does patient have medical clearance?: Yes     Disposition:  Disposition Initial Assessment Completed for this Encounter: Yes Disposition of Patient: Outpatient treatment Type of outpatient treatment: Adult (Per L. Romilda Garret, NP Pt does not meet inpt criteria)  Laurena Slimmer Mollyann Halbert 02/08/2017 10:20 AM

## 2017-02-08 NOTE — BHH Counselor (Signed)
Attempted to assess client.  Currently receiving treatment.  Will call back in 15 minutes.

## 2017-02-08 NOTE — ED Triage Notes (Signed)
Pt. Is presenting from [facility] with SI.Family states he was trying to cut himself with broken glass from picture frames.  Pt expresses the reason he wants to kill himself is because of his limitations "can't walk, old age...etc" States this feeling comes and goes. Is complaining of hip pain.

## 2017-02-22 ENCOUNTER — Emergency Department (HOSPITAL_COMMUNITY)
Admission: EM | Admit: 2017-02-22 | Discharge: 2017-02-22 | Disposition: A | Discharge status: Home / Self Care | Payer: Non-veteran care | Attending: Emergency Medicine | Admitting: Emergency Medicine

## 2017-02-22 ENCOUNTER — Encounter (HOSPITAL_COMMUNITY): Payer: Self-pay | Admitting: *Deleted

## 2017-02-22 ENCOUNTER — Emergency Department (HOSPITAL_COMMUNITY): Payer: Non-veteran care

## 2017-02-22 DIAGNOSIS — Z950 Presence of cardiac pacemaker: Secondary | ICD-10-CM | POA: Insufficient documentation

## 2017-02-22 DIAGNOSIS — Z7982 Long term (current) use of aspirin: Secondary | ICD-10-CM | POA: Insufficient documentation

## 2017-02-22 DIAGNOSIS — R4182 Altered mental status, unspecified: Secondary | ICD-10-CM | POA: Diagnosis not present

## 2017-02-22 DIAGNOSIS — Z79899 Other long term (current) drug therapy: Secondary | ICD-10-CM | POA: Diagnosis not present

## 2017-02-22 DIAGNOSIS — I5020 Unspecified systolic (congestive) heart failure: Secondary | ICD-10-CM | POA: Insufficient documentation

## 2017-02-22 DIAGNOSIS — I110 Hypertensive heart disease with heart failure: Secondary | ICD-10-CM | POA: Insufficient documentation

## 2017-02-22 DIAGNOSIS — Z9002 Acquired absence of larynx: Secondary | ICD-10-CM | POA: Diagnosis not present

## 2017-02-22 DIAGNOSIS — R531 Weakness: Secondary | ICD-10-CM | POA: Diagnosis present

## 2017-02-22 DIAGNOSIS — Z87891 Personal history of nicotine dependence: Secondary | ICD-10-CM | POA: Insufficient documentation

## 2017-02-22 DIAGNOSIS — E039 Hypothyroidism, unspecified: Secondary | ICD-10-CM | POA: Diagnosis not present

## 2017-02-22 LAB — BASIC METABOLIC PANEL
Anion gap: 7 (ref 5–15)
BUN: 27 mg/dL — ABNORMAL HIGH (ref 6–20)
CO2: 27 mmol/L (ref 22–32)
Calcium: 9.1 mg/dL (ref 8.9–10.3)
Chloride: 109 mmol/L (ref 101–111)
Creatinine, Ser: 0.73 mg/dL (ref 0.61–1.24)
GFR calc Af Amer: >60 mL/min (ref >60)
GFR calc non Af Amer: >60 mL/min (ref >60)
Glucose, Bld: 107 mg/dL — ABNORMAL HIGH (ref 65–99)
Potassium: 4.3 mmol/L (ref 3.5–5.1)
Sodium: 143 mmol/L (ref 135–145)

## 2017-02-22 LAB — URINALYSIS, ROUTINE W REFLEX MICROSCOPIC
Bacteria, UA: NONE SEEN
Bilirubin Urine: NEGATIVE
Glucose, UA: NEGATIVE mg/dL
Hgb urine dipstick: NEGATIVE
Ketones, ur: NEGATIVE mg/dL
Leukocytes, UA: NEGATIVE
Nitrite: NEGATIVE
Protein, ur: 30 mg/dL — AB
Specific Gravity, Urine: 1.019 (ref 1.005–1.030)
pH: 5 (ref 5.0–8.0)

## 2017-02-22 LAB — CBC WITH DIFFERENTIAL/PLATELET
Basophils Absolute: 0.1 10*3/uL (ref 0.0–0.1)
Basophils Relative: 1 %
Eosinophils Absolute: 0.2 10*3/uL (ref 0.0–0.7)
Eosinophils Relative: 3 %
HCT: 33.8 % — ABNORMAL LOW (ref 39.0–52.0)
Hemoglobin: 11.3 g/dL — ABNORMAL LOW (ref 13.0–17.0)
Lymphocytes Relative: 15 %
Lymphs Abs: 1.2 10*3/uL (ref 0.7–4.0)
MCH: 33.6 pg (ref 26.0–34.0)
MCHC: 33.4 g/dL (ref 30.0–36.0)
MCV: 100.6 fL — ABNORMAL HIGH (ref 78.0–100.0)
Monocytes Absolute: 0.8 10*3/uL (ref 0.1–1.0)
Monocytes Relative: 10 %
Neutro Abs: 5.6 10*3/uL (ref 1.7–7.7)
Neutrophils Relative %: 71 %
Platelets: 241 10*3/uL (ref 150–400)
RBC: 3.36 MIL/uL — ABNORMAL LOW (ref 4.22–5.81)
RDW: 13.9 % (ref 11.5–15.5)
WBC: 7.8 10*3/uL (ref 4.0–10.5)

## 2017-02-22 MED ORDER — LEVOFLOXACIN 500 MG PO TABS: 500.0000 mg | ORAL_TABLET | Freq: Every day | ORAL | 0 refills | Status: AC

## 2017-02-22 MED ORDER — LEVOFLOXACIN 500 MG PO TABS
500.0000 mg | ORAL_TABLET | Freq: Once | ORAL | Status: AC
  Administered 2017-02-22: 500 mg via ORAL
  Filled 2017-02-22: qty 1

## 2017-02-22 NOTE — ED Notes (Signed)
Asked pt for urine sample. Pt attempted to provide one but was unable at this time. Pt requested that we give him around 30 minutes and attempt to collect sample again before attempting In and Out Catheter.

## 2017-02-22 NOTE — ED Triage Notes (Addendum)
Pt presents to er with c/o "not feeling well" , family reported to ems that pt had altered mental status and they were concerned that he may have an uti, upon arrival to er, pt alert, able to answer all questions, states " I just dont feel good" family is also concerned because pt has had increased mucous coming from his stoma for his trach,

## 2017-02-22 NOTE — ED Notes (Signed)
ED Provider at bedside. 

## 2017-02-22 NOTE — ED Provider Notes (Signed)
[facility] DEPT Provider Note   CSN: [id number] Arrival date & time: 02/22/17  1858  By signing my name below, I, Reola Mosher, attest that this documentation has been prepared under the direction and in the presence of Virgel Manifold, MD. Electronically Signed: Reola Mosher, ED Scribe. 02/22/17. 9:01 PM.  History   Chief Complaint Chief Complaint  Patient presents with  . Weakness    LEVEL V CAVEAT: HPI and ROS limited due to confusion and difficulty understanding pt d/t larynx speech aid device  The history is provided by the patient, a relative and medical records. The history is limited by the condition of the patient. No language interpreter was used.    HPI Comments: Andre Holder is a 83 y.o. male with a PMHx of larynx cancer s/p laryngectomy and tracheostomy now with electrical voice device, CHF, who presents to the Emergency Department complaining of worsening generalized weakness and behavioral changes over the past several days. He presents into the ED with his daughter who reports that over the past several days that he has seemed generally fatigued and has been sleeping in bed more often. She also notes that he has been unable to stand or ambulate, which is somewhat away from his baseline of using a walking device at home. Additionally, she states that the pt has been acting more "stirred up" and away from his mental baseline. Per prior chart review, pt was admitted for renal malfunction which was precipitated by a UTI approximately two months ago. His daughter reports that his behavior today is similar to this last episode.   Past Medical History:  Diagnosis Date  . Adrenal hyperplasia ([place])    Stable on serial imaging  . Anemia    minimal in 2011 with hemoglobin of 12.2 and high normal MCV  . Arteriosclerotic cardiovascular disease (ASCVD)    Nonobstructive; 09/2008 50% proximal and 40% mid LAD; 25% circumflex; 30% RCA; mild global LV dysfunction with  EF of 45%. No aortic stenosis.  . Borderline hypertension    Normal CMet in 2011  . Cancer of larynx ([place])    laryngectomy in 1988; postoperative radiation therapy  . Carotid stenosis   . 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 Degenerative joint disease    s/p bilateral TKR  . GERD (gastroesophageal reflux disease)   . Hyperlipidemia    Lipid profile in 04/2010:115, 98, 43, 52.  Marland Kitchen Hypothyroidism   . Mild aortic stenosis    not documented at catheterization; verified by echo in 2011  . Mitral regurgitation   . Mobitz (type) II atrioventricular block    With bradycardia; Medtronic pacemaker implanted in 09/2008  . Peripheral vascular disease ([place])    With a 70% innominate artery stenosis and nonobstructive carotid stenosis  . Skin cancer   . Small bowel obstruction ([place])   . Tobacco abuse, in remission    Remote  . Weight loss    50 pounds between 1991 and 2011   Patient Active Problem List   Diagnosis Date Noted  . Protein-calorie malnutrition, severe 12/27/2016  . Hypernatremia 12/24/2016  . Pressure injury of skin 12/24/2016  . UTI (urinary tract infection) 12/24/2016  . Shortness of breath 01/18/2016  . Edema 01/18/2016  . Protein calorie malnutrition ([place]) 01/18/2016  . Acute on chronic systolic congestive heart failure ([place])   . Edema extremities   . Partial small bowel obstruction ([place]) 11/05/2015  . Anemia 11/05/2015  .  Malnutrition of moderate degree ([place]) 01/11/2015  . Small bowel obstruction ([place]) 12/23/2014  . Chronic systolic congestive heart failure, NYHA class 2 ([place]) 12/23/2014  . SBO (small bowel obstruction) ([place]) 12/23/2014  . CHF exacerbation ([place]) 08/31/2014  . Acute on chronic systolic CHF (congestive heart failure) ([place]) 08/31/2014  . Chest pain 08/31/2014  . Dyspnea 08/31/2014  . CHF (congestive heart failure) ([place]) 08/25/2013  . Syncope 10/09/2011  . Hyponatremia 10/09/2011  . GERD 06/17/2010  .  WEIGHT LOSS, ABNORMAL 06/17/2010  . ANEMIA 04/08/2010  . Mobitz type II atrioventricular block 04/08/2010  . Hyperlipidemia 10/24/2009  . PACEMAKER, PERMANENT 09/20/2009  . Malignant neoplasm of larynx ([place]) 03/07/2009  . Aortic valve disorder 03/07/2009  . CEREBROVASCULAR DISEASE 03/07/2009  . PERIPHERAL VASCULAR DISEASE 03/07/2009  . Tobacco abuse, in remission 03/07/2009  . Hypothyroidism 12/27/2008  . OSTEOARTHRITIS 12/27/2008   Past Surgical History:  Procedure Laterality Date  . APPENDECTOMY  1973  . CATARACT EXTRACTION, BILATERAL    . DECOMPRESSION FACIAL NERVE     Right median  . ESOPHAGOGASTRODUODENOSCOPY N/A 01/07/2017   Procedure: ESOPHAGOGASTRODUODENOSCOPY (EGD);  Surgeon: Rogene Houston, MD;  Location: [facility];  Service: Endoscopy;  Laterality: N/A;  . INSERT / REPLACE / REMOVE PACEMAKER    . KNEE ARTHROSCOPY     Left  . LARYNGECTOMY  1988   S/P laryngectomy and radiation therapy  . PACEMAKER INSERTION    . TOTAL KNEE ARTHROPLASTY     Bilateral, 19 years ago    Home Medications    Prior to Admission medications   Medication Sig Start Date End Date Taking? Authorizing Provider  acetaminophen (TYLENOL) 500 MG tablet Take 500 mg by mouth every 6 (six) hours as needed.    [provider]  aspirin EC 81 MG tablet Take 81 mg by mouth daily.    [provider]  diclofenac sodium (VOLTAREN) 1 % GEL Apply 2 g topically daily as needed (pain).    [provider]  finasteride (PROSCAR) 5 MG tablet Take 5 mg by mouth at bedtime.    [provider]  guaifenesin (MUCUS RELIEF) 400 MG TABS tablet Take 400 mg by mouth every 4 (four) hours as needed (for mucus).    [provider]  levothyroxine (SYNTHROID, LEVOTHROID) 200 MCG tablet Take 200 mcg by mouth daily before breakfast.    [provider]  Multiple Vitamin (MULTIVITAMIN) tablet Take 1 tablet by mouth daily.    [provider]  OXYGEN Inhale 2 L into  the lungs daily.    [provider]  pantoprazole (PROTONIX) 40 MG tablet Take 40 mg by mouth daily.     [provider]  Tamsulosin HCl (FLOMAX) 0.4 MG CAPS Take 0.4 mg by mouth 2 (two) times daily.     [provider]  torsemide (DEMADEX) 20 MG tablet Take 1 tablet (20 mg total) by mouth 2 days. 01/01/17 01/07/17  Asencion Noble, MD   Family History Family History  Problem Relation Age of Onset  . Stroke Mother   . Leukemia Father   . Stroke Other   . Diabetes Other   . Colon cancer Neg Hx   . Liver disease Neg Hx   . GI problems Neg Hx    Social History Social History  Substance Use Topics  . Smoking status: Former Smoker    Types: Cigarettes  . Smokeless tobacco: Former Systems developer    Quit date: 09/08/1982     Comment: Quit 30 years  .  Alcohol use No   Allergies   Patient has no known allergies.  Review of Systems Review of Systems  Unable to perform ROS: Other (larynyx speech aid & confusion)   Physical Exam Updated Vital Signs BP 99/74   Pulse 80   Temp 98.6 F (37 C) (Oral)   Resp 20   Ht 6' (1.829 m)   Wt 164 lb (74.4 kg)   SpO2 99%   BMI 22.24 kg/m   Physical Exam  Constitutional:  NAD. Frail appearing but non-toxic.   HENT:  Head: Normocephalic.  Right Ear: External ear normal.  Left Ear: External ear normal.  Nose: Nose normal.     Eyes: Conjunctivae are normal. Right eye exhibits no discharge. Left eye exhibits no discharge.  Neck: Normal range of motion.  Bandaid over tracheostomy.  Cardiovascular: Normal rate, regular rhythm and normal heart sounds.   No murmur heard. Pulmonary/Chest: Effort normal and breath sounds normal. No respiratory distress. He has no wheezes. He has no rales.  Abdominal: Soft. There is no tenderness. There is no rebound and no guarding.  Musculoskeletal: Normal range of motion. He exhibits no edema or tenderness.  Neurological: He is alert. No cranial nerve deficit. Coordination normal.  Skin: Skin is  warm and dry. No rash noted. No erythema. No pallor.  Psychiatric: He has a normal mood and affect. His behavior is normal.  Nursing note and vitals reviewed.  ED Treatments / Results  DIAGNOSTIC STUDIES: Oxygen Saturation is 99% on RA, normal by my interpretation.   COORDINATION OF CARE: 9:00 PM-Discussed next steps with pt. Pt verbalized understanding and is agreeable with the plan.   Labs (all labs ordered are listed, but only abnormal results are displayed) Labs Reviewed  CBC WITH DIFFERENTIAL/PLATELET - Abnormal; Notable for the following:       Result Value   RBC 3.36 (*)    Hemoglobin 11.3 (*)    HCT 33.8 (*)    MCV 100.6 (*)    All other components within normal limits  BASIC METABOLIC PANEL - Abnormal; Notable for the following:    Glucose, Bld 107 (*)    BUN 27 (*)    All other components within normal limits  URINALYSIS, ROUTINE W REFLEX MICROSCOPIC   EKG  EKG Interpretation None      Radiology No results found.  Procedures Procedures   Medications Ordered in ED Medications - No data to display  Initial Impression / Assessment and Plan / ED Course  I have reviewed the triage vital signs and the nursing notes.  Pertinent labs & imaging results that were available during my care of the patient were reviewed by me and considered in my medical decision making (see chart for details).      Final Clinical Impressions(s) / ED Diagnoses   Final diagnoses:  Altered mental status, unspecified altered mental status type   New Prescriptions New Prescriptions   No medications on file   I personally preformed the services scribed in my presence. The recorded information has been reviewed is accurate. Virgel Manifold, MD.     Virgel Manifold, MD 03/13/17 [phone]

## 2017-05-09 DEATH — deceased

## 2018-03-08 IMAGING — DX DG CHEST 1V
1 series · 1 of 1 positions shown · non-contrast
Comparison: 12/15/2016 and earlier.

CLINICAL DATA: [AGE] male with altered mental status for 9
days after a fall. Initial encounter.

EXAM:
CHEST 1 VIEW

[chest ap]
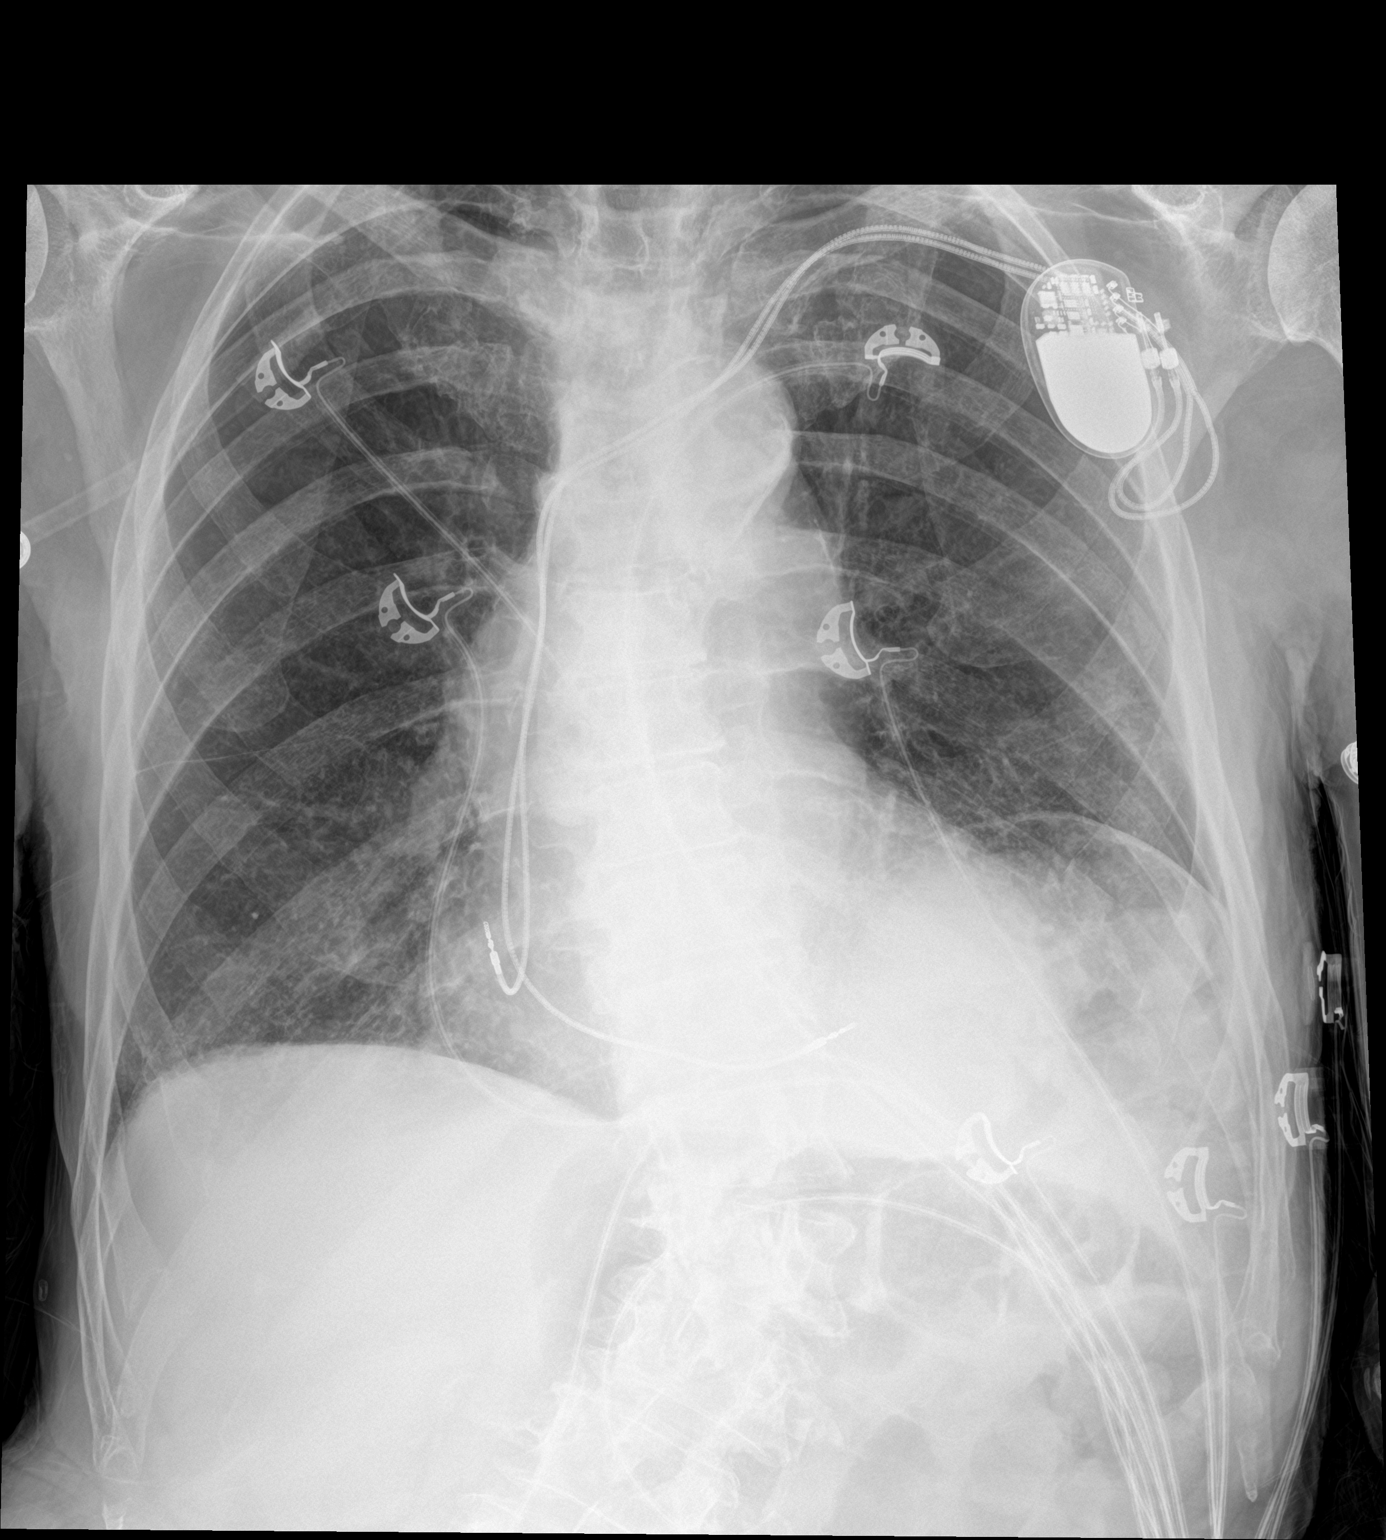

[1 of 1 positions shown; findings below may reference images not displayed]

FINDINGS: AP semi upright view of the chest at 2722 hours. Chronic elevation
of the left hemidiaphragm is stable since 2052. Stable cardiac size
and mediastinal contours. Left chest stool lead cardiac pacemaker.
Calcified aortic atherosclerosis. Visualized tracheal air column is
within normal limits. Chronic left lung base hypo ventilation
appears stable. No pneumothorax, pulmonary edema, pleural effusion
or acute pulmonary opacity. Chronic levoconvex scoliosis at the
thoracolumbar junction. No acute osseous abnormality identified.
Negative visible bowel gas pattern.
IMPRESSION: 1.  No acute cardiopulmonary abnormality.
2. Calcified aortic atherosclerosis.

## 2018-03-08 IMAGING — CT CT HEAD W/O CM
3 series · 15 of 47 positions shown, 18 images · non-contrast
Comparison: 12/15/2016

CLINICAL DATA: Status post fall.  Altered mental status ever since.

EXAM:
CT HEAD WITHOUT CONTRAST
TECHNIQUE: Contiguous axial images were obtained from the base of the skull
through the vertex without intravenous contrast.

[Series 2: head trauma wo · axial · 0.43mm/px · z∈[+53,+193]mm · 9 of 34 slices shown, 12 images]
[im 3/34  brain]
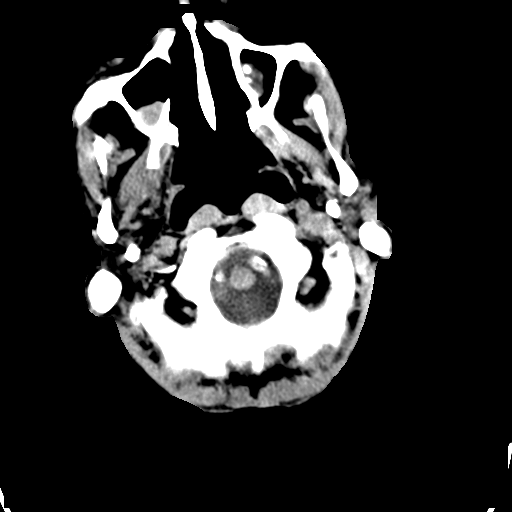
[im 3/34  bone]
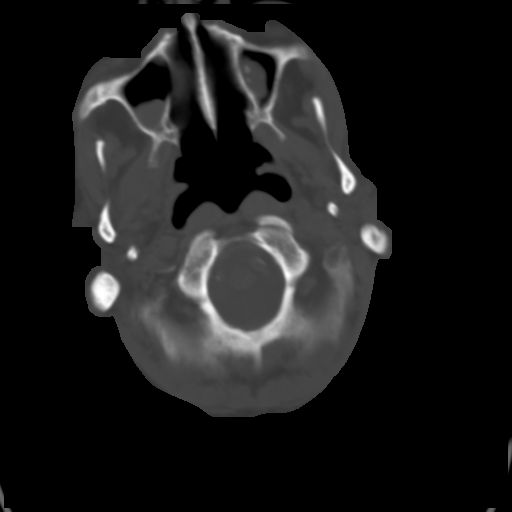
[im 6/34  brain]
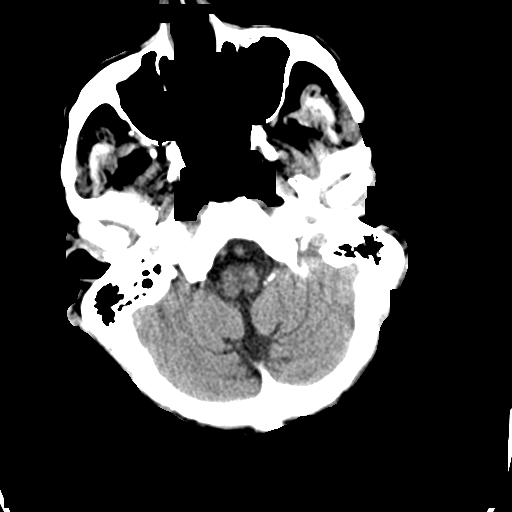
[im 10/34  brain]
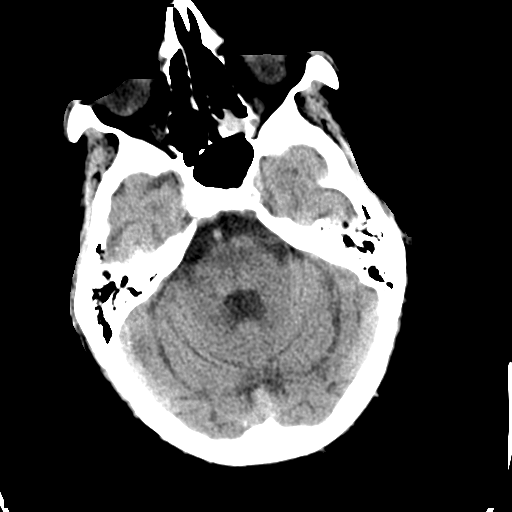
[im 13/34  brain]
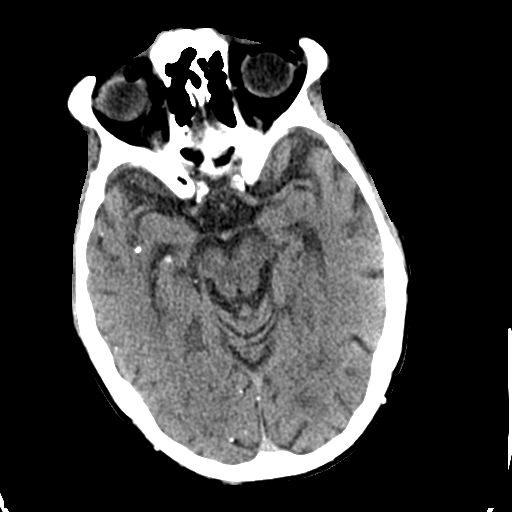
[im 18/34  brain]
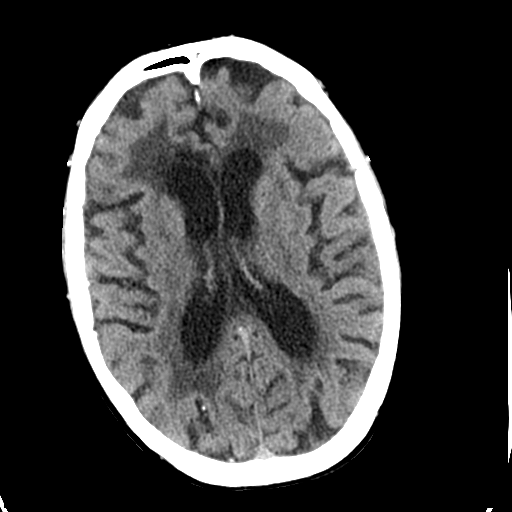
[im 18/34  bone]
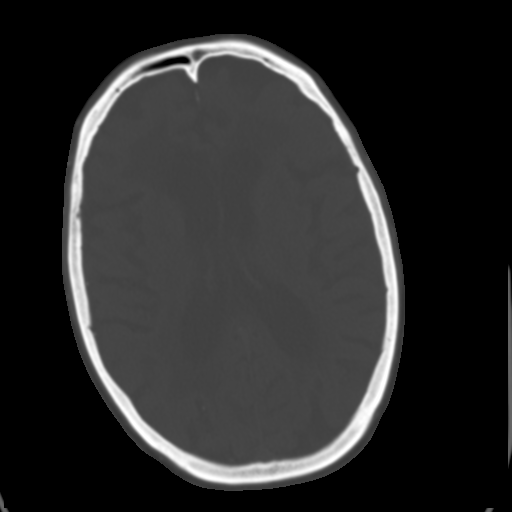
[im 21/34  brain]
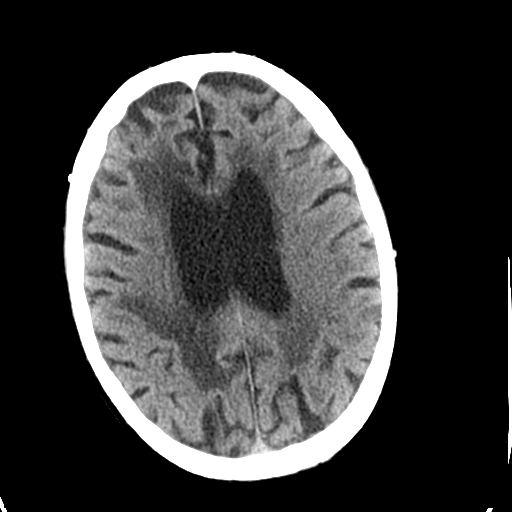
[im 24/34  brain]
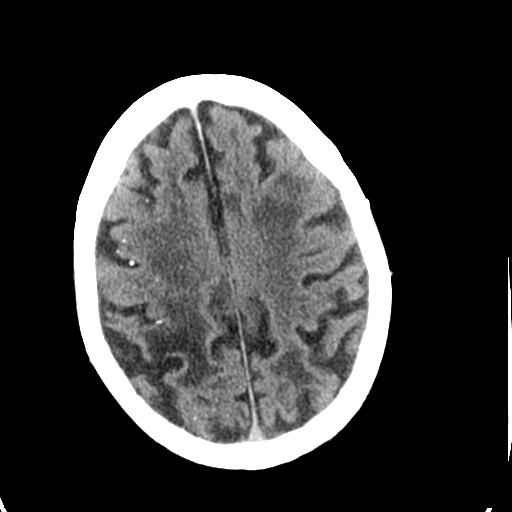
[im 28/34  brain]
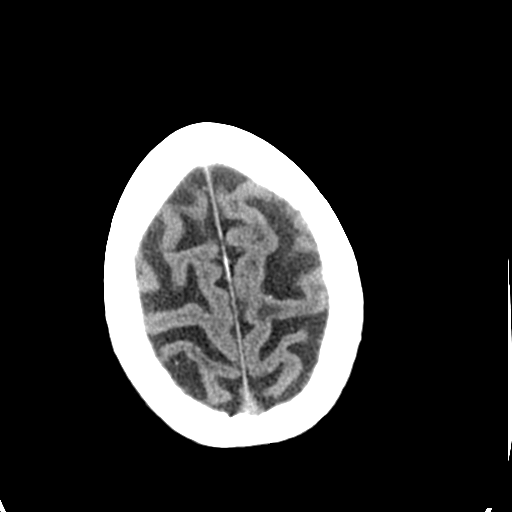
[im 31/34  brain]
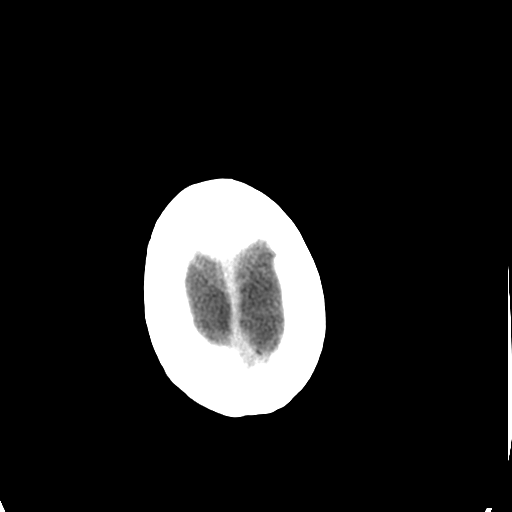
[im 31/34  bone]
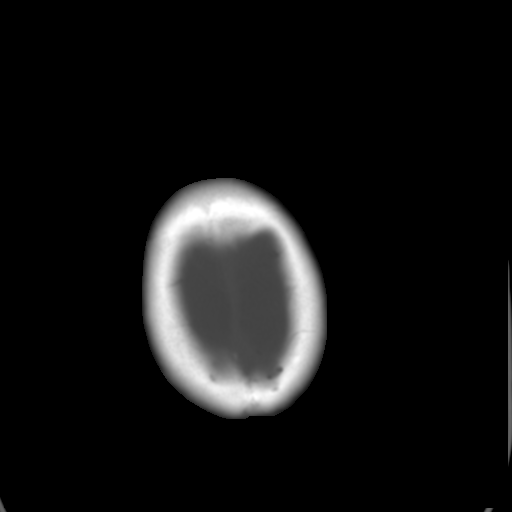

[Series 4: coronal soft tissue · coronal · 0.36mm/px · 3 of 84 slices shown]
[im 28/84  brain]
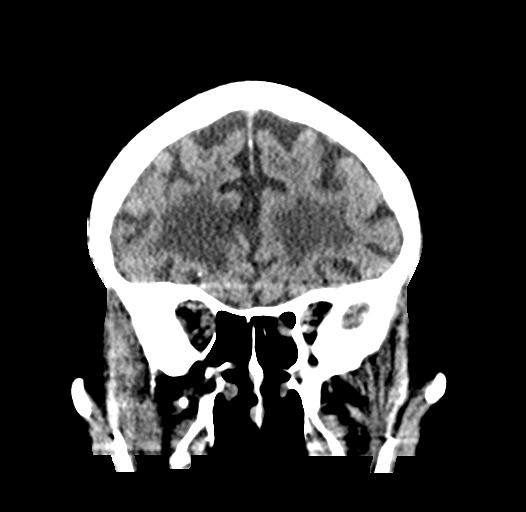
[im 37/84  brain]
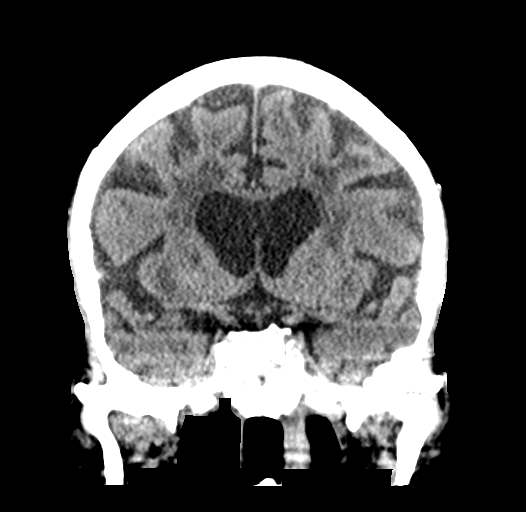
[im 47/84  brain]
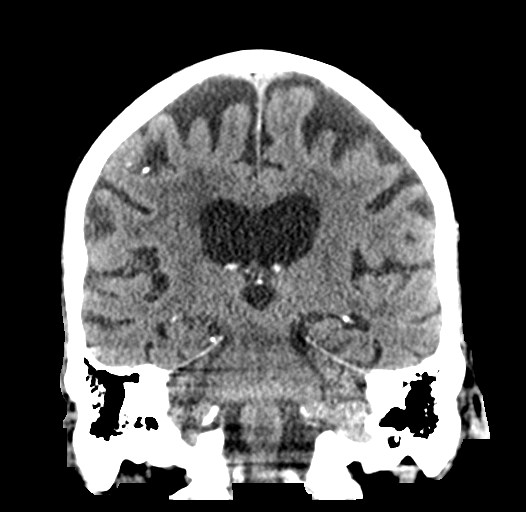

[Series 5: sagittal soft tissue · sagittal · 0.38mm/px · 3 of 56 slices shown]
[im 19/56  brain]
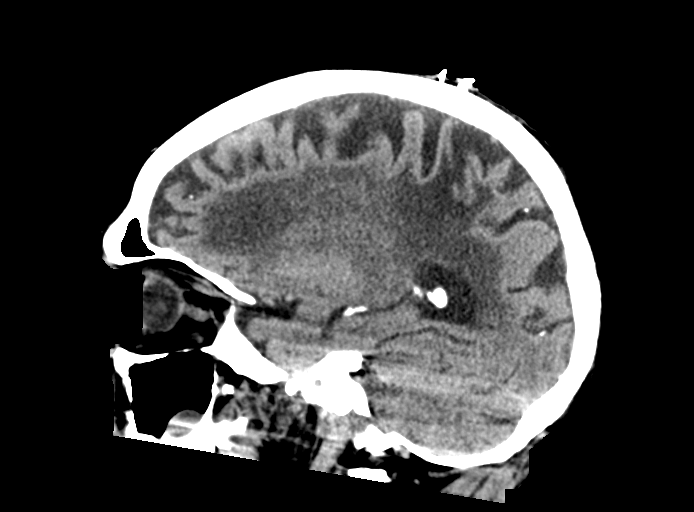
[im 28/56  brain]
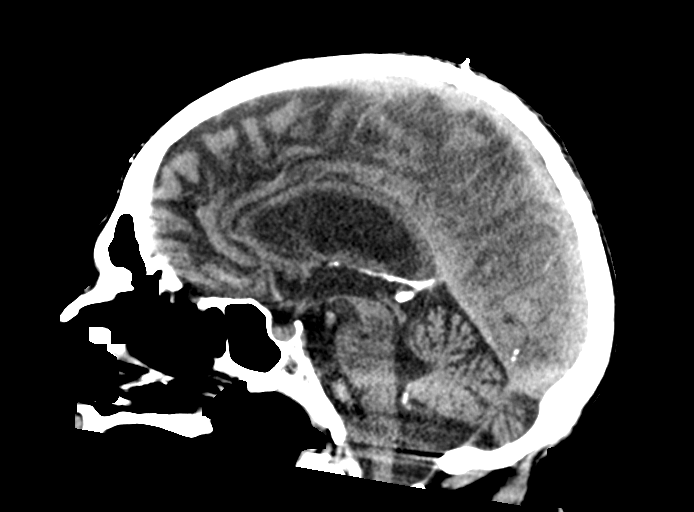
[im 37/56  brain]
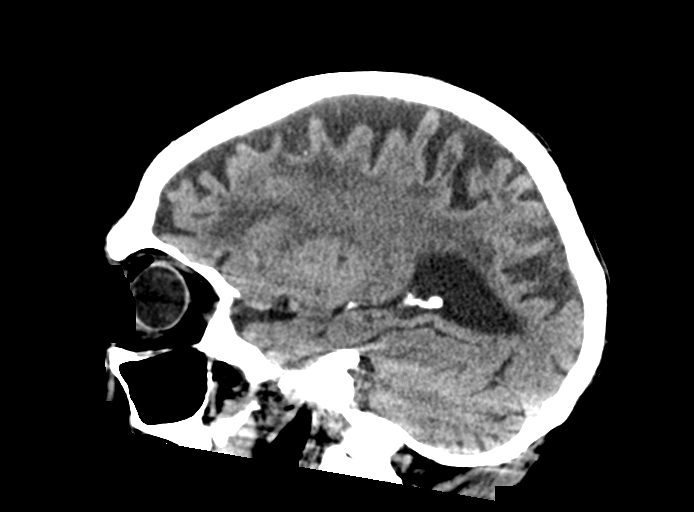

[15 of 47 positions shown; findings below may reference images not displayed]

FINDINGS: Brain: No evidence of acute infarction, hemorrhage, extra-axial
collection, ventriculomegaly, or mass effect. Punctate
calcifications throughout the right cerebral hemisphere likely
reflecting sequela of prior infectious or inflammatory process.
Generalized cerebral atrophy. Periventricular white matter low
attenuation likely secondary to microangiopathy.

Vascular: Cerebrovascular atherosclerotic calcifications are noted.

Skull: Negative for fracture or focal lesion.

Sinuses/Orbits: Visualized portions of the orbits are unremarkable.
Visualized portions of the paranasal sinuses and mastoid air cells
are unremarkable.

Other: None.
IMPRESSION: 1. No acute intracranial pathology.
2. Chronic microvascular disease and cerebral atrophy.
